# Patient Record
Sex: Female | Born: 1990 | Race: Black or African American | Hispanic: No | Marital: Single | Smoking: Never smoker
Health system: Southern US, Community
[De-identification: ages and names within clinical notes are randomized; demographics above are authoritative.]

## PROBLEM LIST (undated history)

## (undated) DIAGNOSIS — F32A Depression, unspecified: Secondary | ICD-10-CM

## (undated) DIAGNOSIS — A599 Trichomoniasis, unspecified: Secondary | ICD-10-CM

## (undated) DIAGNOSIS — F329 Major depressive disorder, single episode, unspecified: Secondary | ICD-10-CM

## (undated) DIAGNOSIS — F952 Tourette's disorder: Secondary | ICD-10-CM

## (undated) DIAGNOSIS — Z789 Other specified health status: Secondary | ICD-10-CM

## (undated) DIAGNOSIS — R87629 Unspecified abnormal cytological findings in specimens from vagina: Secondary | ICD-10-CM

## (undated) DIAGNOSIS — R87619 Unspecified abnormal cytological findings in specimens from cervix uteri: Secondary | ICD-10-CM

## (undated) HISTORY — DX: Depression, unspecified: F32.A

## (undated) HISTORY — PX: NO PAST SURGERIES: SHX2092

## (undated) HISTORY — DX: Trichomoniasis, unspecified: A59.9

## (undated) HISTORY — PX: COLPOSCOPY: SHX161

---
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
## 2012-07-14 ENCOUNTER — Encounter (HOSPITAL_BASED_OUTPATIENT_CLINIC_OR_DEPARTMENT_OTHER): Payer: Self-pay | Admitting: Emergency Medicine

## 2012-07-14 ENCOUNTER — Emergency Department (HOSPITAL_BASED_OUTPATIENT_CLINIC_OR_DEPARTMENT_OTHER)
Admission: EM | Admit: 2012-07-14 | Discharge: 2012-07-14 | Disposition: A | Discharge status: Home / Self Care | Payer: Medicaid Other | Attending: Emergency Medicine | Admitting: Emergency Medicine

## 2012-07-14 DIAGNOSIS — Z3201 Encounter for pregnancy test, result positive: Secondary | ICD-10-CM | POA: Insufficient documentation

## 2012-07-14 DIAGNOSIS — Z3490 Encounter for supervision of normal pregnancy, unspecified, unspecified trimester: Secondary | ICD-10-CM

## 2012-07-14 LAB — URINALYSIS, ROUTINE W REFLEX MICROSCOPIC
Hgb urine dipstick: NEGATIVE
Ketones, ur: NEGATIVE mg/dL
Protein, ur: NEGATIVE mg/dL
Urobilinogen, UA: 0.2 mg/dL (ref 0.0–1.0)

## 2012-07-14 LAB — PREGNANCY, URINE: Preg Test, Ur: POSITIVE — AB

## 2012-07-14 MED ORDER — PRENATAL COMPLETE 14-0.4 MG PO TABS: ORAL_TABLET | ORAL | Status: DC

## 2012-07-14 NOTE — ED Provider Notes (Signed)
Medical screening examination/treatment/procedure(s) were performed by non-physician practitioner and as supervising physician I was immediately available for consultation/collaboration.  Geoffery Lyons, MD 07/14/12 2256

## 2012-07-14 NOTE — ED Notes (Signed)
Pt reports "I took four pregnancy tests and they were positive and I wanted to come and confirm".

## 2012-07-14 NOTE — ED Provider Notes (Signed)
History     CSN: [id number]  Arrival date & time 07/14/12  1748   First MD Initiated Contact with Patient 07/14/12 1812      Chief Complaint  Patient presents with  . testing     (Consider location/radiation/quality/duration/timing/severity/associated sxs/prior treatment) HPI Comments: Pt has had 4 positive pregnancy test.  Pt reports she came in to get confirmation.  Pt reports this is her first pregnancy. Pt's last period was Nov 2.  Pt denies any complaints  The history is provided by the patient. No language interpreter was used.    History reviewed. No pertinent past medical history.  History reviewed. No pertinent past surgical history.  No family history on file.  History  Substance Use Topics  . Smoking status: Never Smoker   . Smokeless tobacco: Not on file  . Alcohol Use: Yes    OB History    Grav Para Term Preterm Abortions TAB SAB Ect Mult Living                  Review of Systems  All other systems reviewed and are negative.    Allergies  Review of patient's allergies indicates no known allergies.  Home Medications  No current outpatient prescriptions on file.  BP 118/74  Pulse 89  Temp 98 F (36.7 C) (Oral)  Resp 20  SpO2 100%  LMP 06/13/2012  Physical Exam  Nursing note and vitals reviewed. Constitutional: She appears well-developed and well-nourished.  HENT:  Head: Normocephalic and atraumatic.  Cardiovascular: Normal rate and normal heart sounds.   Pulmonary/Chest: Effort normal and breath sounds normal.  Abdominal: Soft. Bowel sounds are normal.  Musculoskeletal: Normal range of motion.  Neurological: She is alert.  Psychiatric: She has a normal mood and affect.    ED Course  Procedures (including critical care time)  Labs Reviewed  PREGNANCY, URINE - Abnormal; Notable for the following:    Preg Test, Ur POSITIVE (*)     All other components within normal limits  URINALYSIS, ROUTINE W REFLEX MICROSCOPIC   No results  found.   No diagnosis found.    MDM  Positive pregnancy test.   Pt given rx for prenatal vitamins        Lonia Skinner [place], [place] 07/14/12 1837

## 2013-01-16 DIAGNOSIS — B009 Herpesviral infection, unspecified: Secondary | ICD-10-CM

## 2013-01-16 HISTORY — DX: Herpesviral infection, unspecified: B00.9

## 2014-09-27 ENCOUNTER — Encounter (HOSPITAL_COMMUNITY): Payer: Self-pay | Admitting: *Deleted

## 2014-09-27 ENCOUNTER — Emergency Department (INDEPENDENT_AMBULATORY_CARE_PROVIDER_SITE_OTHER)
Admission: EM | Admit: 2014-09-27 | Discharge: 2014-09-27 | Disposition: A | Discharge status: Home / Self Care | Payer: 59 | Source: Home / Self Care | Attending: Family Medicine | Admitting: Family Medicine

## 2014-09-27 DIAGNOSIS — K529 Noninfective gastroenteritis and colitis, unspecified: Secondary | ICD-10-CM | POA: Diagnosis not present

## 2014-09-27 LAB — POCT URINALYSIS DIP (DEVICE)
Glucose, UA: NEGATIVE mg/dL
Ketones, ur: 40 mg/dL — AB
Leukocytes, UA: NEGATIVE
NITRITE: NEGATIVE
PH: 7 (ref 5.0–8.0)
PROTEIN: 30 mg/dL — AB
Specific Gravity, Urine: 1.025 (ref 1.005–1.030)
UROBILINOGEN UA: 1 mg/dL (ref 0.0–1.0)

## 2014-09-27 LAB — POCT I-STAT, CHEM 8
BUN: 8 mg/dL (ref 6–23)
CREATININE: 0.6 mg/dL (ref 0.50–1.10)
Calcium, Ion: 1.2 mmol/L (ref 1.12–1.23)
Chloride: 101 mmol/L (ref 96–112)
Glucose, Bld: 124 mg/dL — ABNORMAL HIGH (ref 70–99)
HEMATOCRIT: 41 % (ref 36.0–46.0)
HEMOGLOBIN: 13.9 g/dL (ref 12.0–15.0)
Potassium: 3.8 mmol/L (ref 3.5–5.1)
SODIUM: 140 mmol/L (ref 135–145)
TCO2: 23 mmol/L (ref 0–100)

## 2014-09-27 LAB — POCT PREGNANCY, URINE: PREG TEST UR: NEGATIVE

## 2014-09-27 MED ORDER — ONDANSETRON HCL 4 MG/2ML IJ SOLN
INTRAMUSCULAR | Status: AC
  Filled 2014-09-27: qty 2

## 2014-09-27 MED ORDER — ONDANSETRON HCL 4 MG PO TABS: 4.0000 mg | ORAL_TABLET | Freq: Four times a day (QID) | ORAL | Status: DC

## 2014-09-27 MED ORDER — ONDANSETRON 4 MG PO TBDP
ORAL_TABLET | ORAL | Status: AC
  Filled 2014-09-27: qty 2

## 2014-09-27 MED ORDER — ONDANSETRON 4 MG PO TBDP
8.0000 mg | ORAL_TABLET | Freq: Once | ORAL | Status: AC
  Administered 2014-09-27: 8 mg via ORAL

## 2014-09-27 MED ORDER — ONDANSETRON HCL 4 MG/2ML IJ SOLN
4.0000 mg | Freq: Once | INTRAMUSCULAR | Status: AC
  Administered 2014-09-27: 4 mg via INTRAVENOUS

## 2014-09-27 MED ORDER — SODIUM CHLORIDE 0.9 % IV BOLUS (SEPSIS)
1000.0000 mL | Freq: Once | INTRAVENOUS | Status: AC
  Administered 2014-09-27: 1000 mL via INTRAVENOUS

## 2014-09-27 NOTE — ED Notes (Signed)
C/o IV leaking- minimal amount.  Tightened at hub of angiocath.  No further leaking noted.

## 2014-09-27 NOTE — ED Notes (Signed)
Pt. drank 1 cup gatorade without vomiting.  No further nausea.  Feels better. Dr. Juventino Slovak notified.

## 2014-09-27 NOTE — ED Notes (Signed)
Vomited 100 yellow bile with Zofran pills noted. 1 pill partially dissolved the other is whole.  Dr. Juventino Slovak notified.

## 2014-09-27 NOTE — Discharge Instructions (Signed)
Clear liquid , bland diet tonight as tolerated, advance on wed as improved, use medicine as needed, return or see your doctor if any problems.

## 2014-09-27 NOTE — ED Provider Notes (Signed)
CSN: [id number]     Arrival date & time 09/27/14  1542 History   First MD Initiated Contact with Patient 09/27/14 1737     Chief Complaint  Patient presents with  . Abdominal Pain   (Consider location/radiation/quality/duration/timing/severity/associated sxs/prior Treatment) Patient is a 24 y.o. female presenting with abdominal pain and vomiting.  Abdominal Pain Associated symptoms: nausea and vomiting   Associated symptoms: no constipation and no diarrhea   Emesis Severity:  Moderate Duration:  8 hours Quality:  Stomach contents Progression:  Unchanged Chronicity:  New Relieved by:  None tried Worsened by:  Nothing tried Ineffective treatments:  None tried Associated symptoms: abdominal pain   Associated symptoms: no cough, no diarrhea and no fever   Risk factors: no sick contacts and no suspect food intake     History reviewed. No pertinent past medical history. History reviewed. No pertinent past surgical history. History reviewed. No pertinent family history. History  Substance Use Topics  . Smoking status: Never Smoker   . Smokeless tobacco: Not on file  . Alcohol Use: Yes     Comment: occasional   OB History    No data available     Review of Systems  Constitutional: Negative.   HENT: Negative.   Gastrointestinal: Positive for nausea, vomiting and abdominal pain. Negative for diarrhea, constipation and blood in stool.    Allergies  Review of patient's allergies indicates no known allergies.  Home Medications   Prior to Admission medications   Medication Sig Start Date End Date Taking? Authorizing Provider  ondansetron (ZOFRAN) 4 MG tablet Take 1 tablet (4 mg total) by mouth every 6 (six) hours. Prn n/v 09/27/14   Billy Fischer, MD  Prenatal Vit-Fe Fumarate-FA (PRENATAL COMPLETE) 14-0.4 MG TABS One a day 07/14/12   Fransico Meadow, PA-C   BP 117/72 mmHg  Pulse 100  Temp(Src) 98.6 F (37 C) (Oral)  Resp 16  SpO2 98%  LMP  (LMP Unknown) Physical Exam    Constitutional: She is oriented to person, place, and time. She appears well-developed and well-nourished. No distress.  HENT:  Mouth/Throat: Oropharynx is clear and moist.  Eyes: Conjunctivae are normal. Pupils are equal, round, and reactive to light.  Neck: Normal range of motion. Neck supple.  Cardiovascular: Normal heart sounds and intact distal pulses.   Pulmonary/Chest: Effort normal and breath sounds normal.  Abdominal: Soft. Normal appearance and bowel sounds are normal. She exhibits no distension and no mass. There is no hepatosplenomegaly. There is tenderness in the epigastric area. There is no rigidity, no rebound, no guarding, no CVA tenderness, no tenderness at McBurney's point and negative Murphy's sign.    Genitourinary:  No pelvic tenderness or d/c.  Lymphadenopathy:    She has no cervical adenopathy.  Neurological: She is alert and oriented to person, place, and time.  Skin: Skin is warm and dry.  Nursing note and vitals reviewed.   ED Course  Procedures (including critical care time) Labs Review Labs Reviewed  POCT URINALYSIS DIP (DEVICE) - Abnormal; Notable for the following:    Bilirubin Urine SMALL (*)    Ketones, ur 40 (*)    Hgb urine dipstick MODERATE (*)    Protein, ur 30 (*)    All other components within normal limits  POCT I-STAT, CHEM 8 - Abnormal; Notable for the following:    Glucose, Bld 124 (*)    All other components within normal limits  POCT PREGNANCY, URINE    Imaging Review No results found.  MDM   1. Gastroenteritis, acute    Vomited po zofran  So ivf and meds given. Sx improved after ivf and meds, tolerating po.    Billy Fischer, MD 09/27/14 [phone]

## 2014-09-27 NOTE — ED Notes (Addendum)
C/o lower mid abdominal pain onset this AM.  Pain is constant. Had to leave work @ 0800.  C/o vomiting and feeling lightheaded.  No diarrhea. Small BM today.  V x 20 today.  No fever.  LMP unknown. Has implant contraception.  No fever.

## 2017-08-26 ENCOUNTER — Other Ambulatory Visit: Payer: Self-pay

## 2017-08-26 ENCOUNTER — Encounter (HOSPITAL_BASED_OUTPATIENT_CLINIC_OR_DEPARTMENT_OTHER): Payer: Self-pay | Admitting: Respiratory Therapy

## 2017-08-26 ENCOUNTER — Emergency Department (HOSPITAL_BASED_OUTPATIENT_CLINIC_OR_DEPARTMENT_OTHER)
Admission: EM | Admit: 2017-08-26 | Discharge: 2017-08-26 | Disposition: A | Discharge status: Home / Self Care | Payer: 59 | Attending: Emergency Medicine | Admitting: Emergency Medicine

## 2017-08-26 ENCOUNTER — Emergency Department (HOSPITAL_BASED_OUTPATIENT_CLINIC_OR_DEPARTMENT_OTHER): Payer: 59

## 2017-08-26 DIAGNOSIS — R05 Cough: Secondary | ICD-10-CM | POA: Diagnosis present

## 2017-08-26 DIAGNOSIS — J189 Pneumonia, unspecified organism: Secondary | ICD-10-CM | POA: Insufficient documentation

## 2017-08-26 DIAGNOSIS — Z79899 Other long term (current) drug therapy: Secondary | ICD-10-CM | POA: Diagnosis not present

## 2017-08-26 DIAGNOSIS — J181 Lobar pneumonia, unspecified organism: Secondary | ICD-10-CM

## 2017-08-26 MED ORDER — AZITHROMYCIN 250 MG PO TABS: 250.0000 mg | ORAL_TABLET | Freq: Every day | ORAL | 0 refills | Status: DC

## 2017-08-26 MED FILL — AZITHROMYCIN 250 MG TABLET: 250 | 5 days supply | Qty: 6 | Fill #0

## 2017-08-26 NOTE — ED Triage Notes (Signed)
Cough body aches, headache and fever x 2 days.

## 2017-08-26 NOTE — ED Provider Notes (Signed)
[place] EMERGENCY DEPARTMENT Provider Note   CSN: [id number] Arrival date & time: 08/26/17  1049     History   Chief Complaint Chief Complaint  Patient presents with  . Cough    HPI Jody Robinson is a 27 y.o. female.  27yo F who p/w cough.  She reports 2 days of cough associated with body aches, headache, congestion, and fevers up to 102 yesterday.  No sore throat, vomiting, or diarrhea.  She has been around a niece who is sick with similar symptoms.  She has taken Motrin without much relief, last dose was yesterday.  No medications today prior to arrival.  No urinary symptoms.   The history is provided by the patient.    History reviewed. No pertinent past medical history.  There are no active problems to display for this patient.   History reviewed. No pertinent surgical history.  OB History    No data available       Home Medications    Prior to Admission medications   Medication Sig Start Date End Date Taking? Authorizing Provider  azithromycin (ZITHROMAX) 250 MG tablet Take 1 tablet (250 mg total) by mouth daily. Take first 2 tablets together, then 1 every day until finished. 08/26/17   Filipe Greathouse, Wenda Overland, MD  ondansetron (ZOFRAN) 4 MG tablet Take 1 tablet (4 mg total) by mouth every 6 (six) hours. Prn n/v 09/27/14   Billy Fischer, MD  Prenatal Vit-Fe Fumarate-FA (PRENATAL COMPLETE) 14-0.4 MG TABS One a day 07/14/12   Fransico Meadow, PA-C    Family History No family history on file.  Social History Social History   Tobacco Use  . Smoking status: Never Smoker  . Smokeless tobacco: Never Used  Substance Use Topics  . Alcohol use: Yes    Comment: occasional  . Drug use: No     Allergies   Patient has no known allergies.   Review of Systems Review of Systems All other systems reviewed and are negative except that which was mentioned in HPI   Physical Exam Updated Vital Signs BP 132/82   Pulse (!) 112   Temp 99.3 F (37.4  C) (Oral)   Resp 14   Ht 5' (1.524 m)   Wt 68 kg (150 lb)   SpO2 98%   BMI 29.29 kg/m   Physical Exam  Constitutional: She is oriented to person, place, and time. She appears well-developed and well-nourished. No distress.  HENT:  Head: Normocephalic and atraumatic.  Mouth/Throat: Oropharynx is clear and moist. No oropharyngeal exudate.  Moist mucous membranes  Eyes: Conjunctivae are normal. Pupils are equal, round, and reactive to light.  Neck: Normal range of motion. Neck supple.  Cardiovascular: Normal rate, regular rhythm and normal heart sounds.  No murmur heard. Pulmonary/Chest: Effort normal and breath sounds normal.  Abdominal: Soft. Bowel sounds are normal. She exhibits no distension. There is no tenderness.  Musculoskeletal: She exhibits no edema.  Lymphadenopathy:    She has no cervical adenopathy.  Neurological: She is alert and oriented to person, place, and time.  Fluent speech  Skin: Skin is warm and dry. No rash noted.  Psychiatric: She has a normal mood and affect. Judgment normal.  Nursing note and vitals reviewed.    ED Treatments / Results  Labs (all labs ordered are listed, but only abnormal results are displayed) Labs Reviewed - No data to display  EKG  EKG Interpretation None       Radiology Dg Chest 2  View  Result Date: 08/26/2017 CLINICAL DATA:  Cough, fever, and congestion for 2 days, smoker EXAM: CHEST  2 VIEW COMPARISON:  None FINDINGS: Normal heart size, mediastinal contours, and pulmonary vascularity. Peribronchial thickening with question subtle RIGHT suprahilar infiltrate. Remaining lungs clear. No pleural effusion or pneumothorax. Bones unremarkable. IMPRESSION: Bronchitic changes with question perihilar RIGHT upper lobe infiltrate. Electronically Signed   By: Lavonia Dana M.D.   On: 08/26/2017 11:46    Procedures Procedures (including critical care time)  Medications Ordered in ED Medications - No data to display   Initial  Impression / Assessment and Plan / ED Course  I have reviewed the triage vital signs and the nursing notes.  Pertinent imaging results that were available during my care of the patient were reviewed by me and considered in my medical decision making (see chart for details).     Well appearing on exam w/ reassuring VS, O2 sat 98-100% on RA, normal WOB. CXR shows ? R upper lobe infiltrate. Given fevers and other sx, will treat for CAP with azithromycin as pt has no other major comorbidities. She is otherwise well appearing and comfortable w/ outpatient management.  Discussed supportive measures for her symptoms and extensively reviewed return precautions.  She voiced understanding and was discharged in satisfactory condition. Final Clinical Impressions(s) / ED Diagnoses   Final diagnoses:  Community acquired pneumonia of right upper lobe of lung [facility])    ED Discharge Orders        Ordered    azithromycin (ZITHROMAX) 250 MG tablet  Daily     08/26/17 1240       Artavis Cowie, Wenda Overland, MD 08/26/17 1251

## 2017-10-09 DIAGNOSIS — J302 Other seasonal allergic rhinitis: Secondary | ICD-10-CM | POA: Insufficient documentation

## 2017-10-09 DIAGNOSIS — Z8742 Personal history of other diseases of the female genital tract: Secondary | ICD-10-CM

## 2017-10-09 HISTORY — DX: Other seasonal allergic rhinitis: J30.2

## 2017-10-09 HISTORY — DX: Personal history of other diseases of the female genital tract: Z87.42

## 2017-10-13 DIAGNOSIS — R748 Abnormal levels of other serum enzymes: Secondary | ICD-10-CM

## 2017-10-13 HISTORY — DX: Abnormal levels of other serum enzymes: R74.8

## 2018-04-02 ENCOUNTER — Encounter (HOSPITAL_BASED_OUTPATIENT_CLINIC_OR_DEPARTMENT_OTHER): Payer: Self-pay | Admitting: *Deleted

## 2018-04-02 ENCOUNTER — Other Ambulatory Visit: Payer: Self-pay

## 2018-04-02 ENCOUNTER — Emergency Department (HOSPITAL_BASED_OUTPATIENT_CLINIC_OR_DEPARTMENT_OTHER)
Admission: EM | Admit: 2018-04-02 | Discharge: 2018-04-02 | Disposition: A | Discharge status: Home / Self Care | Payer: 59 | Attending: Emergency Medicine | Admitting: Emergency Medicine

## 2018-04-02 DIAGNOSIS — J020 Streptococcal pharyngitis: Secondary | ICD-10-CM | POA: Insufficient documentation

## 2018-04-02 DIAGNOSIS — Z79899 Other long term (current) drug therapy: Secondary | ICD-10-CM | POA: Diagnosis not present

## 2018-04-02 DIAGNOSIS — J029 Acute pharyngitis, unspecified: Secondary | ICD-10-CM | POA: Diagnosis present

## 2018-04-02 LAB — GROUP A STREP BY PCR: GROUP A STREP BY PCR: DETECTED — AB

## 2018-04-02 LAB — PREGNANCY, URINE: Preg Test, Ur: NEGATIVE

## 2018-04-02 MED ORDER — DEXAMETHASONE SODIUM PHOSPHATE 10 MG/ML IJ SOLN
10.0000 mg | Freq: Once | INTRAMUSCULAR | Status: AC
  Administered 2018-04-02: 10 mg via INTRAMUSCULAR
  Filled 2018-04-02: qty 1

## 2018-04-02 MED ORDER — PENICILLIN G BENZATHINE & PROC 1200000 UNIT/2ML IM SUSP
1.2000 10*6.[IU] | Freq: Once | INTRAMUSCULAR | Status: AC
  Administered 2018-04-02: 1.2 10*6.[IU] via INTRAMUSCULAR
  Filled 2018-04-02: qty 2

## 2018-04-02 MED ORDER — ACETAMINOPHEN 500 MG PO TABS
1000.0000 mg | ORAL_TABLET | Freq: Once | ORAL | Status: AC
  Administered 2018-04-02: 1000 mg via ORAL
  Filled 2018-04-02: qty 2

## 2018-04-02 NOTE — Discharge Instructions (Signed)
You can take Tylenol or Ibuprofen as directed for pain. You can alternate Tylenol and Ibuprofen every 4 hours. If you take Tylenol at 1pm, then you can take Ibuprofen at 5pm. Then you can take Tylenol again at 9pm.   Make sure you are staying hydrated drinking plenty of fluids.  Return to emergency department for any fever, worsening pain, difficulty swallowing her secretions, vomiting or any other worsening or concerning symptoms.

## 2018-04-02 NOTE — ED Triage Notes (Signed)
Pt reports sore throat x yesterday, denies fever, occassional non productive cough.

## 2018-04-02 NOTE — ED Provider Notes (Signed)
.  Encouraged at home supportive care measures. [place] EMERGENCY DEPARTMENT Provider Note   CSN: [id number] Arrival date & time: 04/02/18  1523     History   Chief Complaint Chief Complaint  Patient presents with  . Sore Throat    HPI Jody Robinson is a 27 y.o. female who presents for evaluation of sore throat since yesterday.  Patient reports that she has not taken any medication for the pain.  She reports she has not had any fever.  Reports some cough but states the cough is nonproductive.  Patient states that pain is worsened with swallowing but she has been able to tolerate her secretions and tolerate p.o.  Patient denies any recent nasal congestion, runny nose, SOB, vomiting.   The history is provided by the patient.    History reviewed. No pertinent past medical history.  There are no active problems to display for this patient.   History reviewed. No pertinent surgical history.   OB History   None      Home Medications    Prior to Admission medications   Medication Sig Start Date End Date Taking? Authorizing Provider  azithromycin (ZITHROMAX) 250 MG tablet Take 1 tablet (250 mg total) by mouth daily. Take first 2 tablets together, then 1 every day until finished. 08/26/17   Little, Wenda Overland, MD  ondansetron (ZOFRAN) 4 MG tablet Take 1 tablet (4 mg total) by mouth every 6 (six) hours. Prn n/v 09/27/14   Billy Fischer, MD  Prenatal Vit-Fe Fumarate-FA (PRENATAL COMPLETE) 14-0.4 MG TABS One a day 07/14/12   Sidney Ace    Family History History reviewed. No pertinent family history.  Social History Social History   Tobacco Use  . Smoking status: Never Smoker  . Smokeless tobacco: Never Used  Substance Use Topics  . Alcohol use: Yes    Comment: occasional  . Drug use: No     Allergies   Patient has no known allergies.   Review of Systems Review of Systems  Constitutional: Negative for fever.  HENT: Positive for sore  throat. Negative for congestion, drooling, rhinorrhea and trouble swallowing.   Respiratory: Positive for cough. Negative for shortness of breath.   Gastrointestinal: Negative for vomiting.  All other systems reviewed and are negative.    Physical Exam Updated Vital Signs BP 125/71 (BP Location: Left Arm)   Pulse 98   Temp 99.7 F (37.6 C) (Oral)   Resp 16   LMP 03/12/2018   SpO2 100%   Physical Exam  Constitutional: She appears well-developed and well-nourished.  HENT:  Head: Normocephalic and atraumatic.  Posterior pharynx is erythematous.  Tonsillar exudates noted on the right side.  Uvula is midline.  No trismus.  Airways patent, phonation is intact.  Eyes: Conjunctivae and EOM are normal. Right eye exhibits no discharge. Left eye exhibits no discharge. No scleral icterus.  Pulmonary/Chest: Effort normal.  Neurological: She is alert.  Skin: Skin is warm and dry.  Psychiatric: She has a normal mood and affect. Her speech is normal and behavior is normal.  Nursing note and vitals reviewed.    ED Treatments / Results  Labs (all labs ordered are listed, but only abnormal results are displayed) Labs Reviewed  GROUP A STREP BY PCR - Abnormal; Notable for the following components:      Result Value   Group A Strep by PCR DETECTED (*)    All other components within normal limits  PREGNANCY, URINE  EKG None  Radiology No results found.  Procedures Procedures (including critical care time)  Medications Ordered in ED Medications  acetaminophen (TYLENOL) tablet 1,000 mg (1,000 mg Oral Given 04/02/18 1650)  penicillin g procaine-penicillin g benzathine (BICILLIN-CR) injection 600000-600000 units (1.2 Million Units Intramuscular Given 04/02/18 1705)  dexamethasone (DECADRON) injection 10 mg (10 mg Intramuscular Given 04/02/18 1705)     Initial Impression / Assessment and Plan / ED Course  I have reviewed the triage vital signs and the nursing notes.  Pertinent labs  & imaging results that were available during my care of the patient were reviewed by me and considered in my medical decision making (see chart for details).     27 year old female who presents for evaluation of throat that began yesterday.  No fevers.  Reports nonproductive cough.  Able to tolerate secretions and p.o. Patient is afebrile, non-toxic appearing, sitting comfortably on examination table. Vital signs reviewed and stable.  Exam, posterior oropharynx is erythematous with evidence of sided tonsillar exudates.  Consider pharyngitis.  History/physical exam is not concerning for peritonsillar abscess, Ludwig angina.  Rapid strep ordered at triage.  Strep Reviewed.  Positive.  Discussed results with patient.  She would rather be treated with the IM penicillin.  Encouraged at home supportive care measures. Patient had ample opportunity for questions and discussion. All patient's questions were answered with full understanding. Strict return precautions discussed. Patient expresses understanding and agreement to plan.   Final Clinical Impressions(s) / ED Diagnoses   Final diagnoses:  Strep pharyngitis    ED Discharge Orders    None       Volanda Napoleon, PA-C 04/02/18 [place], DO 04/02/18 2245

## 2018-12-06 ENCOUNTER — Encounter (HOSPITAL_COMMUNITY): Payer: Self-pay | Admitting: *Deleted

## 2018-12-06 ENCOUNTER — Emergency Department (HOSPITAL_COMMUNITY)
Admission: EM | Admit: 2018-12-06 | Discharge: 2018-12-06 | Disposition: A | Discharge status: Home / Self Care | Payer: 59 | Attending: Emergency Medicine | Admitting: Emergency Medicine

## 2018-12-06 ENCOUNTER — Other Ambulatory Visit: Payer: Self-pay

## 2018-12-06 DIAGNOSIS — Z3A01 Less than 8 weeks gestation of pregnancy: Secondary | ICD-10-CM | POA: Diagnosis not present

## 2018-12-06 DIAGNOSIS — O219 Vomiting of pregnancy, unspecified: Secondary | ICD-10-CM | POA: Diagnosis present

## 2018-12-06 DIAGNOSIS — Z79899 Other long term (current) drug therapy: Secondary | ICD-10-CM | POA: Insufficient documentation

## 2018-12-06 LAB — I-STAT BETA HCG BLOOD, ED (MC, WL, AP ONLY): I-stat hCG, quantitative: >2000 m[IU]/mL — ABNORMAL HIGH (ref <5)

## 2018-12-06 MED ORDER — ONDANSETRON 4 MG PO TBDP
4.0000 mg | ORAL_TABLET | Freq: Once | ORAL | Status: AC
  Administered 2018-12-06: 02:00:00 4 mg via ORAL
  Filled 2018-12-06: qty 1

## 2018-12-06 MED ORDER — VITAMIN B-6 100 MG PO TABS
100.0000 mg | ORAL_TABLET | ORAL | Status: AC
  Administered 2018-12-06: 100 mg via ORAL
  Filled 2018-12-06: qty 1

## 2018-12-06 MED ORDER — SODIUM CHLORIDE 0.9 % IV BOLUS
1000.0000 mL | Freq: Once | INTRAVENOUS | Status: AC
  Administered 2018-12-06: 1000 mL via INTRAVENOUS

## 2018-12-06 MED ORDER — DOXYLAMINE-PYRIDOXINE 10-10 MG PO TBEC: 2.0000 | DELAYED_RELEASE_TABLET | Freq: Every day | ORAL | 0 refills | Status: AC

## 2018-12-06 MED ORDER — DOXYLAMINE SUCCINATE (SLEEP) 25 MG PO TABS
25.0000 mg | ORAL_TABLET | Freq: Every evening | ORAL | Status: DC | PRN
  Administered 2018-12-06: 25 mg via ORAL
  Filled 2018-12-06 (×2): qty 1

## 2018-12-06 NOTE — ED Notes (Signed)
Pt given and verbalized understanding of d/c instructions and need for follow up with pcp and OB/Gyn. Told to return if s/s worsen. No further distress or questions upon ambulation out of department.

## 2018-12-06 NOTE — Discharge Instructions (Signed)
Use Diclegis as prescribed for management of nausea.  Drink plenty of clear liquids to prevent dehydration.  Follow-up with your OB/GYN if symptoms persist.

## 2018-12-06 NOTE — ED Notes (Signed)
Pt resting in bed. Feels some relief from medications but still has nausea. Was able to keep fluids and meds down. Pt in NAD. VS rechecked.

## 2018-12-06 NOTE — ED Triage Notes (Signed)
Pt c/o nausea x 3 days.  Pt stated "I did the test myself and I'm [redacted] weeks pregnant.  My appt is 5-8."

## 2018-12-06 NOTE — ED Provider Notes (Signed)
[facility] DEPT Provider Note   CSN: [id number] Arrival date & time: 12/06/18  0009    History   Chief Complaint Chief Complaint  Patient presents with  . Nausea  . Routine Prenatal Visit  . Morning Sickness    HPI Jody Robinson is a 28 y.o. female.     28 year old G2 female, currently reporting to be [redacted] weeks pregnant (LMP 10/20/18), presents to the emergency department for complaints of nausea x3 days.  She states that she has been unable to eat or drink anything due to nausea.  She has had 3-4 episodes of vomiting today.  Emesis has been nonbloody.  Subjectively feels dehydrated.  No significant abdominal pain.  No vaginal bleeding, fevers, lightheadedness, syncope.  Patient denies taking any medications prior to arrival.  She has experienced nausea in pregnancy previously.  Scheduled for her first prenatal visit on 12/18/2018.  The history is provided by the patient. No language interpreter was used.    History reviewed. No pertinent past medical history.  There are no active problems to display for this patient.   History reviewed. No pertinent surgical history.   OB History    Gravida  1   Para      Term      Preterm      AB      Living        SAB      TAB      Ectopic      Multiple      Live Births               Home Medications    Prior to Admission medications   Medication Sig Start Date End Date Taking? Authorizing Provider  azithromycin (ZITHROMAX) 250 MG tablet Take 1 tablet (250 mg total) by mouth daily. Take first 2 tablets together, then 1 every day until finished. 08/26/17   Little, Wenda Overland, MD  Doxylamine-Pyridoxine (DICLEGIS) 10-10 MG TBEC Take 2 tablets by mouth at bedtime for 10 days. 12/06/18 12/16/18  Antonietta Breach, PA-C  ondansetron (ZOFRAN) 4 MG tablet Take 1 tablet (4 mg total) by mouth every 6 (six) hours. Prn n/v 09/27/14   Billy Fischer, MD  Prenatal Vit-Fe Fumarate-FA (PRENATAL  COMPLETE) 14-0.4 MG TABS One a day 07/14/12   Fransico Meadow, PA-C    Family History No family history on file.  Social History Social History   Tobacco Use  . Smoking status: Never Smoker  . Smokeless tobacco: Never Used  Substance Use Topics  . Alcohol use: Yes    Comment: occasional  . Drug use: No     Allergies   Patient has no known allergies.   Review of Systems Review of Systems Ten systems reviewed and are negative for acute change, except as noted in the HPI.    Physical Exam Updated Vital Signs BP 113/82 (BP Location: Left Arm)   Pulse 76   Temp 98.8 F (37.1 C) (Oral)   Resp 17   Ht 5' (1.524 m)   Wt 68 kg   LMP 10/20/2018 (Exact Date)   SpO2 100%   BMI 29.29 kg/m   Physical Exam Vitals signs and nursing note reviewed.  Constitutional:      General: She is not in acute distress.    Appearance: She is well-developed. She is not diaphoretic.     Comments: Nontoxic appearing and in NAD  HENT:     Head: Normocephalic and atraumatic.  Mouth/Throat:     Comments: Mildly dry mm Eyes:     General: No scleral icterus.    Conjunctiva/sclera: Conjunctivae normal.  Neck:     Musculoskeletal: Normal range of motion.  Pulmonary:     Effort: Pulmonary effort is normal. No respiratory distress.     Comments: Respirations even and unlabored Abdominal:     Comments: Soft, nontender, nondistended abdomen.  Musculoskeletal: Normal range of motion.  Skin:    General: Skin is warm and dry.     Coloration: Skin is not pale.     Findings: No erythema or rash.  Neurological:     Mental Status: She is alert and oriented to person, place, and time.  Psychiatric:        Behavior: Behavior normal.      ED Treatments / Results  Labs (all labs ordered are listed, but only abnormal results are displayed) Labs Reviewed  I-STAT BETA HCG BLOOD, ED ([facility], [facility], [facility] ONLY) - Abnormal; Notable for the following components:      Result Value   I-stat hCG,  quantitative >2,000.0 (*)    All other components within normal limits    EKG None  Radiology No results found.  Procedures Procedures (including critical care time)  Medications Ordered in ED Medications  doxylamine (Sleep) (UNISOM) tablet 25 mg (25 mg Oral Given 12/06/18 0111)  pyridOXINE (VITAMIN B-6) tablet 100 mg (100 mg Oral Given 12/06/18 0112)  sodium chloride 0.9 % bolus 1,000 mL (0 mLs Intravenous Stopped 12/06/18 0203)  ondansetron (ZOFRAN-ODT) disintegrating tablet 4 mg (4 mg Oral Given 12/06/18 0202)    2:00 AM Zofran ordered for persistent nausea though Unisom/B6 did "take the edge off", per patient. She has tolerated water without vomiting.  2:30 AM Nausea now resolved. VSS. Appropriate for discharge.   Initial Impression / Assessment and Plan / ED Course  I have reviewed the triage vital signs and the nursing notes.  Pertinent labs & imaging results that were available during my care of the patient were reviewed by me and considered in my medical decision making (see chart for details).        28 year old female presents to the emergency department for evaluation of worsening nausea and first trimester of pregnancy.  Initially managed with doxylamine/pyridoxine with some improvement.  Subsequently given 1 tablet of ODT Zofran for persistent nausea.  She has been able to tolerate oral fluids without difficulty.  Abdomen soft, nontender.  No complaints of vaginal bleeding.  Vital signs have been stable.  The patient is appropriate for discharge and outpatient follow-up with her OB/GYN it scheduled prenatal visit in 12 days.  Return precautions discussed and provided. Patient discharged in stable condition with no unaddressed concerns.   Final Clinical Impressions(s) / ED Diagnoses   Final diagnoses:  Nausea and vomiting during pregnancy prior to [redacted] weeks gestation    ED Discharge Orders         Ordered    Doxylamine-Pyridoxine (DICLEGIS) 10-10 MG TBEC  Daily  at bedtime     12/06/18 0226           Antonietta Breach, PA-C 12/06/18 6734    Orpah Greek, MD 12/06/18 2325

## 2018-12-14 ENCOUNTER — Inpatient Hospital Stay (HOSPITAL_COMMUNITY)
Admission: AD | Admit: 2018-12-14 | Discharge: 2018-12-14 | Disposition: A | Discharge status: Home / Self Care | Payer: 59 | Attending: Obstetrics & Gynecology | Admitting: Obstetrics & Gynecology

## 2018-12-14 ENCOUNTER — Encounter (HOSPITAL_COMMUNITY): Payer: Self-pay | Admitting: *Deleted

## 2018-12-14 ENCOUNTER — Other Ambulatory Visit: Payer: Self-pay

## 2018-12-14 DIAGNOSIS — Z3A01 Less than 8 weeks gestation of pregnancy: Secondary | ICD-10-CM | POA: Diagnosis not present

## 2018-12-14 DIAGNOSIS — Z79899 Other long term (current) drug therapy: Secondary | ICD-10-CM | POA: Diagnosis not present

## 2018-12-14 DIAGNOSIS — R1030 Lower abdominal pain, unspecified: Secondary | ICD-10-CM | POA: Diagnosis not present

## 2018-12-14 DIAGNOSIS — O9989 Other specified diseases and conditions complicating pregnancy, childbirth and the puerperium: Secondary | ICD-10-CM | POA: Insufficient documentation

## 2018-12-14 DIAGNOSIS — O210 Mild hyperemesis gravidarum: Secondary | ICD-10-CM | POA: Diagnosis not present

## 2018-12-14 HISTORY — DX: Unspecified abnormal cytological findings in specimens from vagina: R87.629

## 2018-12-14 LAB — URINALYSIS, ROUTINE W REFLEX MICROSCOPIC
Bacteria, UA: NONE SEEN
Bilirubin Urine: NEGATIVE
Glucose, UA: NEGATIVE mg/dL
Ketones, ur: 80 mg/dL — AB
Nitrite: NEGATIVE
Protein, ur: 100 mg/dL — AB
Specific Gravity, Urine: 1.03 (ref 1.005–1.030)
pH: 5 (ref 5.0–8.0)

## 2018-12-14 LAB — CBC
HCT: 42.5 % (ref 36.0–46.0)
Hemoglobin: 13.8 g/dL (ref 12.0–15.0)
MCH: 26.8 pg (ref 26.0–34.0)
MCHC: 32.5 g/dL (ref 30.0–36.0)
MCV: 82.5 fL (ref 80.0–100.0)
Platelets: 678 10*3/uL — ABNORMAL HIGH (ref 150–400)
RBC: 5.15 MIL/uL — ABNORMAL HIGH (ref 3.87–5.11)
RDW: 13.2 % (ref 11.5–15.5)
WBC: 13.8 10*3/uL — ABNORMAL HIGH (ref 4.0–10.5)
nRBC: 0 % (ref 0.0–0.2)

## 2018-12-14 LAB — COMPREHENSIVE METABOLIC PANEL
ALT: 18 U/L (ref 0–44)
AST: 22 U/L (ref 15–41)
Albumin: 4.3 g/dL (ref 3.5–5.0)
Alkaline Phosphatase: 69 U/L (ref 38–126)
Anion gap: 15 (ref 5–15)
BUN: 14 mg/dL (ref 6–20)
CO2: 21 mmol/L — ABNORMAL LOW (ref 22–32)
Calcium: 9.5 mg/dL (ref 8.9–10.3)
Chloride: 101 mmol/L (ref 98–111)
Creatinine, Ser: 0.86 mg/dL (ref 0.44–1.00)
GFR calc Af Amer: >60 mL/min (ref >60)
GFR calc non Af Amer: >60 mL/min (ref >60)
Glucose, Bld: 94 mg/dL (ref 70–99)
Potassium: 3.6 mmol/L (ref 3.5–5.1)
Sodium: 137 mmol/L (ref 135–145)
Total Bilirubin: 1.8 mg/dL — ABNORMAL HIGH (ref 0.3–1.2)
Total Protein: 8.3 g/dL — ABNORMAL HIGH (ref 6.5–8.1)

## 2018-12-14 MED ORDER — METOCLOPRAMIDE HCL 10 MG PO TABS: 10.0000 mg | ORAL_TABLET | Freq: Four times a day (QID) | ORAL | 0 refills | Status: DC

## 2018-12-14 MED ORDER — SODIUM CHLORIDE 0.9 % IV SOLN
25.0000 mg | Freq: Once | INTRAVENOUS | Status: AC
  Administered 2018-12-14: 25 mg via INTRAVENOUS
  Filled 2018-12-14: qty 1

## 2018-12-14 MED ORDER — ONDANSETRON HCL 4 MG/2ML IJ SOLN
4.0000 mg | Freq: Once | INTRAMUSCULAR | Status: AC
  Administered 2018-12-14: 4 mg via INTRAVENOUS
  Filled 2018-12-14: qty 2

## 2018-12-14 MED ORDER — M.V.I. ADULT IV INJ
INJECTION | Freq: Once | INTRAVENOUS | Status: AC
  Administered 2018-12-14: 20:00:00 via INTRAVENOUS
  Filled 2018-12-14: qty 1000

## 2018-12-14 MED ORDER — PROMETHAZINE HCL 12.5 MG PO TABS: 12.5000 mg | ORAL_TABLET | Freq: Four times a day (QID) | ORAL | 0 refills | Status: DC | PRN

## 2018-12-14 NOTE — MAU Provider Note (Signed)
History     CSN: [id number]  Arrival date and time: 12/14/18 1656   First Provider Initiated Contact with Patient 12/14/18 1803      Chief Complaint  Patient presents with  . Emesis  . Abdominal Pain   HPI  Ms.  Jody Robinson is a 28 y.o. year old G62P1001 female at [redacted]w[redacted]d weeks gestation who presents to MAU reporting vomiting x 2-3 wks, not able to keep anything down. She takes Zofran for vomiting, but states that it is not working. She did not take any Zofran did not take it today. She also complains of mid to lower abdominal pain/cramping since this AM. She has no complaints of diarrhea, VB or LOF. She has established [facility] in [place], [place]. Her first appointment is scheduled for Friday 12/18/2018. The last time she ate something was 12/13/2018 @ 0600 and the last time she drank something was at 0730 this AM, but "none of it stay down." She reports she was "sick like this with my daughter 5 yrs ago."   Past Medical History:  Diagnosis Date  . Vaginal Pap smear, abnormal     Past Surgical History:  Procedure Laterality Date  . NO PAST SURGERIES      History reviewed. No pertinent family history.  Social History   Tobacco Use  . Smoking status: Never Smoker  . Smokeless tobacco: Never Used  Substance Use Topics  . Alcohol use: Not Currently    Comment: occasional  . Drug use: No    Allergies: No Known Allergies  Medications Prior to Admission  Medication Sig Dispense Refill Last Dose  . ondansetron (ZOFRAN) 4 MG tablet Take 1 tablet (4 mg total) by mouth every 6 (six) hours. Prn n/v 8 tablet 0 12/13/2018 at Unknown time  . Prenatal Vit-Fe Fumarate-FA (PRENATAL COMPLETE) 14-0.4 MG TABS One a day 60 each 1 Past Week at Unknown time  . azithromycin (ZITHROMAX) 250 MG tablet Take 1 tablet (250 mg total) by mouth daily. Take first 2 tablets together, then 1 every day until finished. 6 tablet 0   . Doxylamine-Pyridoxine (DICLEGIS) 10-10 MG TBEC Take 2 tablets by mouth at bedtime  for 10 days. 20 tablet 0     Review of Systems  Constitutional: Negative.   HENT: Negative.   Eyes: Negative.   Respiratory: Negative.   Gastrointestinal: Positive for abdominal pain, nausea and vomiting. Negative for diarrhea.  Endocrine: Negative.   Musculoskeletal: Negative.   Skin: Negative.   Allergic/Immunologic: Negative.   Neurological: Negative.   Hematological: Negative.   Psychiatric/Behavioral: Negative.    Physical Exam   Blood pressure 112/65, pulse 80, temperature 97.8 F (36.6 C), temperature source Oral, resp. rate 16, weight 64.2 kg, last menstrual period 10/20/2018.  Physical Exam  Nursing note and vitals reviewed. Constitutional: She is oriented to person, place, and time. She appears well-developed and well-nourished.  HENT:  Head: Normocephalic and atraumatic.  Eyes: Pupils are equal, round, and reactive to light.  Neck: Normal range of motion.  Cardiovascular: Normal rate.  Respiratory: Effort normal and breath sounds normal.  GI: Soft. Normal appearance. Bowel sounds are decreased.  Genitourinary:    Genitourinary Comments: Pelvic deferred   Musculoskeletal: Normal range of motion.  Neurological: She is alert and oriented to person, place, and time.  Skin: Skin is warm and dry.  Psychiatric: She has a normal mood and affect. Her behavior is normal. Judgment and thought content normal.    MAU Course  Procedures  MDM CCUA  CBC CMP IVFs: Phenergan 25 mg in LR 1000 ml @ 999 ml/hr; followed by MVI in LR 1000 ml @ 500 ml/hr --  No vomiting, but nausea persists Zofran 4 mg IVPB PO Challenge -- patient tolerated crackers and gingerale well  Results for orders placed or performed during the hospital encounter of 12/14/18 (from the past 24 hour(s))  Urinalysis, Routine w reflex microscopic     Status: Abnormal   Collection Time: 12/14/18  5:28 PM  Result Value Ref Range   Color, Urine AMBER (A) YELLOW   APPearance HAZY (A) CLEAR   Specific  Gravity, Urine 1.030 1.005 - 1.030   pH 5.0 5.0 - 8.0   Glucose, UA NEGATIVE NEGATIVE mg/dL   Hgb urine dipstick MODERATE (A) NEGATIVE   Bilirubin Urine NEGATIVE NEGATIVE   Ketones, ur 80 (A) NEGATIVE mg/dL   Protein, ur 100 (A) NEGATIVE mg/dL   Nitrite NEGATIVE NEGATIVE   Leukocytes,Ua SMALL (A) NEGATIVE   RBC / HPF 0-5 0 - 5 RBC/hpf   WBC, UA 0-5 0 - 5 WBC/hpf   Bacteria, UA NONE SEEN NONE SEEN   Squamous Epithelial / LPF 0-5 0 - 5   Mucus PRESENT    Hyaline Casts, UA PRESENT   CBC     Status: Abnormal   Collection Time: 12/14/18  6:58 PM  Result Value Ref Range   WBC 13.8 (H) 4.0 - 10.5 K/uL   RBC 5.15 (H) 3.87 - 5.11 MIL/uL   Hemoglobin 13.8 12.0 - 15.0 g/dL   HCT 42.5 36.0 - 46.0 %   MCV 82.5 80.0 - 100.0 fL   MCH 26.8 26.0 - 34.0 pg   MCHC 32.5 30.0 - 36.0 g/dL   RDW 13.2 11.5 - 15.5 %   Platelets 678 (H) 150 - 400 K/uL   nRBC 0.0 0.0 - 0.2 %  Comprehensive metabolic panel     Status: Abnormal   Collection Time: 12/14/18  6:58 PM  Result Value Ref Range   Sodium 137 135 - 145 mmol/L   Potassium 3.6 3.5 - 5.1 mmol/L   Chloride 101 98 - 111 mmol/L   CO2 21 (L) 22 - 32 mmol/L   Glucose, Bld 94 70 - 99 mg/dL   BUN 14 6 - 20 mg/dL   Creatinine, Ser 0.86 0.44 - 1.00 mg/dL   Calcium 9.5 8.9 - 10.3 mg/dL   Total Protein 8.3 (H) 6.5 - 8.1 g/dL   Albumin 4.3 3.5 - 5.0 g/dL   AST 22 15 - 41 U/L   ALT 18 0 - 44 U/L   Alkaline Phosphatase 69 38 - 126 U/L   Total Bilirubin 1.8 (H) 0.3 - 1.2 mg/dL   GFR calc non Af Amer >60 >60 mL/min   GFR calc Af Amer >60 >60 mL/min   Anion gap 15 5 - 15    Assessment and Plan  Morning sickness - Plan: Discharge patient - Rx for Phenergan 12.5 mg every 6 hrs (inserted vaginally, if unable to keep down anything) - Information provided on morning sickness - F/U with OB provider in [facility], [place] as scheduled on Friday 5//03/2019 - Patient verbalized an understanding of the plan of care and agrees.   Laury Deep, MSN, CNM 12/14/2018,  6:03 PM

## 2018-12-14 NOTE — MAU Note (Signed)
Pt C/O vomiting x 2-3 weeks, nothing has stayed down.  Has been taking zofran but it isn't working, last took it yesterday.  Also C/O mid & lower abdominal pain since this morning.  Denies bleeding.  No diarrhea.

## 2019-01-15 DIAGNOSIS — O210 Mild hyperemesis gravidarum: Secondary | ICD-10-CM | POA: Insufficient documentation

## 2019-01-25 ENCOUNTER — Other Ambulatory Visit: Payer: Self-pay

## 2019-01-25 ENCOUNTER — Inpatient Hospital Stay (HOSPITAL_COMMUNITY): Payer: 59

## 2019-01-25 ENCOUNTER — Inpatient Hospital Stay (HOSPITAL_COMMUNITY)
Admission: AD | Admit: 2019-01-25 | Discharge: 2019-01-25 | Disposition: A | Discharge status: Home / Self Care | Payer: 59 | Attending: Obstetrics and Gynecology | Admitting: Obstetrics and Gynecology

## 2019-01-25 ENCOUNTER — Encounter (HOSPITAL_COMMUNITY): Payer: Self-pay | Admitting: *Deleted

## 2019-01-25 DIAGNOSIS — E860 Dehydration: Secondary | ICD-10-CM

## 2019-01-25 DIAGNOSIS — R748 Abnormal levels of other serum enzymes: Secondary | ICD-10-CM

## 2019-01-25 DIAGNOSIS — R112 Nausea with vomiting, unspecified: Secondary | ICD-10-CM

## 2019-01-25 DIAGNOSIS — Z3A13 13 weeks gestation of pregnancy: Secondary | ICD-10-CM | POA: Insufficient documentation

## 2019-01-25 DIAGNOSIS — O210 Mild hyperemesis gravidarum: Secondary | ICD-10-CM | POA: Diagnosis present

## 2019-01-25 DIAGNOSIS — O211 Hyperemesis gravidarum with metabolic disturbance: Secondary | ICD-10-CM | POA: Insufficient documentation

## 2019-01-25 LAB — COMPREHENSIVE METABOLIC PANEL
ALT: 239 U/L — ABNORMAL HIGH (ref 0–44)
AST: 154 U/L — ABNORMAL HIGH (ref 15–41)
Albumin: 3.6 g/dL (ref 3.5–5.0)
Alkaline Phosphatase: 103 U/L (ref 38–126)
Anion gap: 17 — ABNORMAL HIGH (ref 5–15)
BUN: 16 mg/dL (ref 6–20)
CO2: 20 mmol/L — ABNORMAL LOW (ref 22–32)
Calcium: 9.2 mg/dL (ref 8.9–10.3)
Chloride: 94 mmol/L — ABNORMAL LOW (ref 98–111)
Creatinine, Ser: 0.91 mg/dL (ref 0.44–1.00)
GFR calc Af Amer: >60 mL/min (ref >60)
GFR calc non Af Amer: >60 mL/min (ref >60)
Glucose, Bld: 106 mg/dL — ABNORMAL HIGH (ref 70–99)
Potassium: 3.8 mmol/L (ref 3.5–5.1)
Sodium: 131 mmol/L — ABNORMAL LOW (ref 135–145)
Total Bilirubin: 1.9 mg/dL — ABNORMAL HIGH (ref 0.3–1.2)
Total Protein: 8 g/dL (ref 6.5–8.1)

## 2019-01-25 LAB — URINALYSIS, ROUTINE W REFLEX MICROSCOPIC
Glucose, UA: 100 mg/dL — AB
Ketones, ur: >80 mg/dL — AB
Nitrite: POSITIVE — AB
Protein, ur: 100 mg/dL — AB
Specific Gravity, Urine: >1.03 — ABNORMAL HIGH (ref 1.005–1.030)
pH: 5.5 (ref 5.0–8.0)

## 2019-01-25 LAB — CBC
HCT: 40.9 % (ref 36.0–46.0)
Hemoglobin: 14.1 g/dL (ref 12.0–15.0)
MCH: 28 pg (ref 26.0–34.0)
MCHC: 34.5 g/dL (ref 30.0–36.0)
MCV: 81.2 fL (ref 80.0–100.0)
Platelets: 660 10*3/uL — ABNORMAL HIGH (ref 150–400)
RBC: 5.04 MIL/uL (ref 3.87–5.11)
RDW: 13.2 % (ref 11.5–15.5)
WBC: 14.3 10*3/uL — ABNORMAL HIGH (ref 4.0–10.5)
nRBC: 0 % (ref 0.0–0.2)

## 2019-01-25 LAB — URINALYSIS, MICROSCOPIC (REFLEX)

## 2019-01-25 LAB — LIPASE, BLOOD: Lipase: 37 U/L (ref 11–51)

## 2019-01-25 MED ORDER — SCOPOLAMINE 1 MG/3DAYS TD PT72
1.0000 | MEDICATED_PATCH | TRANSDERMAL | Status: DC
  Administered 2019-01-25: 13:00:00 1.5 mg via TRANSDERMAL
  Filled 2019-01-25: qty 1

## 2019-01-25 MED ORDER — FAMOTIDINE IN NACL 20-0.9 MG/50ML-% IV SOLN
20.0000 mg | Freq: Once | INTRAVENOUS | Status: AC
  Administered 2019-01-25: 20 mg via INTRAVENOUS
  Filled 2019-01-25: qty 50

## 2019-01-25 MED ORDER — SCOPOLAMINE 1 MG/3DAYS TD PT72: 1.0000 | MEDICATED_PATCH | TRANSDERMAL | 1 refills | Status: DC

## 2019-01-25 MED ORDER — LACTATED RINGERS IV BOLUS
1000.0000 mL | Freq: Once | INTRAVENOUS | Status: AC
  Administered 2019-01-25: 1000 mL via INTRAVENOUS

## 2019-01-25 MED ORDER — ONDANSETRON 8 MG PO TBDP: 8.0000 mg | ORAL_TABLET | Freq: Three times a day (TID) | ORAL | 1 refills | Status: DC | PRN

## 2019-01-25 MED ORDER — FAMOTIDINE 20 MG PO TABS: 20.0000 mg | ORAL_TABLET | Freq: Every day | ORAL | 1 refills | Status: DC

## 2019-01-25 MED ORDER — LACTATED RINGERS IV BOLUS
1000.0000 mL | Freq: Once | INTRAVENOUS | Status: AC
  Administered 2019-01-25: 16:00:00 1000 mL via INTRAVENOUS

## 2019-01-25 MED ORDER — PROMETHAZINE HCL 25 MG/ML IJ SOLN
25.0000 mg | Freq: Four times a day (QID) | INTRAMUSCULAR | Status: DC | PRN
  Administered 2019-01-25: 25 mg via INTRAVENOUS
  Filled 2019-01-25: qty 1

## 2019-01-25 MED ORDER — SODIUM CHLORIDE 0.9 % IV SOLN
8.0000 mg | Freq: Once | INTRAVENOUS | Status: AC
  Administered 2019-01-25: 16:00:00 8 mg via INTRAVENOUS
  Filled 2019-01-25: qty 4

## 2019-01-25 NOTE — MAU Provider Note (Signed)
History     CSN: [id number]  Arrival date and time: 01/25/19 1027   First Provider Initiated Contact with Patient 01/25/19 1139      Chief Complaint  Patient presents with  . Emesis  . Dizziness   G2P1001 @13 .6 wks presenting with N/V and dizziness. N/V has been ongoing during the pregnancy but worsened 2 days ago. She has tried Zofran but didn't help. Dizziness started yesterday. No syncope. She is unable to keep anything down. Denies abd pain, but reports achyness in her lower abdomen. No pregnancy complaints. No urinary sx. No BM in 2 weeks.    OB History    Gravida  2   Para  1   Term  1   Preterm      AB      Living  1     SAB      TAB      Ectopic      Multiple      Live Births              Past Medical History:  Diagnosis Date  . Medical history non-contributory   . Vaginal Pap smear, abnormal     Past Surgical History:  Procedure Laterality Date  . NO PAST SURGERIES      Family History  Problem Relation Age of Onset  . Diabetes Mother     Social History   Tobacco Use  . Smoking status: Never Smoker  . Smokeless tobacco: Never Used  Substance Use Topics  . Alcohol use: Not Currently    Comment: occasional  . Drug use: No    Allergies: No Known Allergies  Medications Prior to Admission  Medication Sig Dispense Refill Last Dose  . metoCLOPramide (REGLAN) 10 MG tablet Take 1 tablet (10 mg total) by mouth every 6 (six) hours. 30 tablet 0 01/24/2019 at 1200  . promethazine (PHENERGAN) 12.5 MG tablet Take 1 tablet (12.5 mg total) by mouth every 6 (six) hours as needed for nausea or vomiting. Insert vaginally, if unable to keep down anything 30 tablet 0 Past Week at Unknown time  . Prenatal Vit-Fe Fumarate-FA (PRENATAL COMPLETE) 14-0.4 MG TABS One a day 60 each 1 More than a month at Unknown time    Review of Systems  Constitutional: Negative for chills and fever.  Gastrointestinal: Positive for constipation, nausea and vomiting.  Negative for abdominal pain and diarrhea.  Genitourinary: Negative for dysuria, frequency, hematuria, urgency and vaginal bleeding.  Musculoskeletal: Negative for back pain.   Physical Exam   Blood pressure 112/64, pulse 91, temperature 99 F (37.2 C), temperature source Oral, resp. rate 20, weight 59 kg, last menstrual period 10/20/2018, SpO2 100 %.  Physical Exam  Nursing note and vitals reviewed. Constitutional: She is oriented to person, place, and time. She appears well-developed and well-nourished. No distress.  HENT:  Head: Normocephalic and atraumatic.  Neck: Normal range of motion.  Respiratory: Effort normal. No respiratory distress.  GI: Soft. She exhibits no distension and no mass. There is no abdominal tenderness. There is no rebound, no guarding and no CVA tenderness.  Musculoskeletal: Normal range of motion.  Neurological: She is alert and oriented to person, place, and time.  Skin: Skin is warm and dry.  Psychiatric: She has a normal mood and affect.  FHT 167  Results for orders placed or performed during the hospital encounter of 01/25/19 (from the past 24 hour(s))  Urinalysis, Routine w reflex microscopic     Status: Abnormal  Collection Time: 01/25/19 12:00 PM  Result Value Ref Range   Color, Urine AMBER (A) YELLOW   APPearance CLOUDY (A) CLEAR   Specific Gravity, Urine >1.030 (H) 1.005 - 1.030   pH 5.5 5.0 - 8.0   Glucose, UA 100 (A) NEGATIVE mg/dL   Hgb urine dipstick LARGE (A) NEGATIVE   Bilirubin Urine LARGE (A) NEGATIVE   Ketones, ur >80 (A) NEGATIVE mg/dL   Protein, ur 100 (A) NEGATIVE mg/dL   Nitrite POSITIVE (A) NEGATIVE   Leukocytes,Ua MODERATE (A) NEGATIVE  Urinalysis, Microscopic (reflex)     Status: Abnormal   Collection Time: 01/25/19 12:00 PM  Result Value Ref Range   RBC / HPF 6-10 0 - 5 RBC/hpf   WBC, UA 11-20 0 - 5 WBC/hpf   Bacteria, UA RARE (A) NONE SEEN   Squamous Epithelial / LPF 6-10 0 - 5   Mucus PRESENT    Hyaline Casts, UA  PRESENT    Urine-Other LESS THAN 10 mL OF URINE SUBMITTED   CBC     Status: Abnormal   Collection Time: 01/25/19  1:27 PM  Result Value Ref Range   WBC 14.3 (H) 4.0 - 10.5 K/uL   RBC 5.04 3.87 - 5.11 MIL/uL   Hemoglobin 14.1 12.0 - 15.0 g/dL   HCT 40.9 36.0 - 46.0 %   MCV 81.2 80.0 - 100.0 fL   MCH 28.0 26.0 - 34.0 pg   MCHC 34.5 30.0 - 36.0 g/dL   RDW 13.2 11.5 - 15.5 %   Platelets 660 (H) 150 - 400 K/uL   nRBC 0.0 0.0 - 0.2 %  Comprehensive metabolic panel     Status: Abnormal   Collection Time: 01/25/19  1:27 PM  Result Value Ref Range   Sodium 131 (L) 135 - 145 mmol/L   Potassium 3.8 3.5 - 5.1 mmol/L   Chloride 94 (L) 98 - 111 mmol/L   CO2 20 (L) 22 - 32 mmol/L   Glucose, Bld 106 (H) 70 - 99 mg/dL   BUN 16 6 - 20 mg/dL   Creatinine, Ser 0.91 0.44 - 1.00 mg/dL   Calcium 9.2 8.9 - 10.3 mg/dL   Total Protein 8.0 6.5 - 8.1 g/dL   Albumin 3.6 3.5 - 5.0 g/dL   AST 154 (H) 15 - 41 U/L   ALT 239 (H) 0 - 44 U/L   Alkaline Phosphatase 103 38 - 126 U/L   Total Bilirubin 1.9 (H) 0.3 - 1.2 mg/dL   GFR calc non Af Amer >60 >60 mL/min   GFR calc Af Amer >60 >60 mL/min   Anion gap 17 (H) 5 - 15  Lipase, blood     Status: None   Collection Time: 01/25/19  1:27 PM  Result Value Ref Range   Lipase 37 11 - 51 U/L   US Abdomen Limited Ruq  Result Date: 01/25/2019 CLINICAL DATA:  Elevated liver enzymes, nausea and vomiting. Thirteen weeks pregnant. EXAM: ULTRASOUND ABDOMEN LIMITED RIGHT UPPER QUADRANT COMPARISON:  None. FINDINGS: Gallbladder: Sludge is present within the gallbladder. No gallstones or wall thickening visualized. No sonographic Murphy sign noted by sonographer. Common bile duct: Diameter: 3 mm Liver: No focal lesion identified. Within normal limits in parenchymal echogenicity. Portal vein is patent on color Doppler imaging with normal direction of blood flow towards the liver. IMPRESSION: 1. Gallbladder sludge. No gallstones identified. No evidence of cholecystitis. 2. No bile  duct dilatation. 3. Liver appears normal. Electronically Signed   By: Roxy Horseman.D.  On: 01/25/2019 15:12   MAU Course  Procedures Orders Placed This Encounter  Procedures  . Culture, OB Urine    Standing Status:   Standing    Number of Occurrences:   1  . US ABDOMEN LIMITED RUQ    Standing Status:   Standing    Number of Occurrences:   1    Order Specific Question:   Symptom/Reason for Exam    Answer:   Elevated liver enzymes [312411]    Order Specific Question:   Symptom/Reason for Exam    Answer:   Nausea & vomiting [161096]  . Urinalysis, Routine w reflex microscopic    Standing Status:   Standing    Number of Occurrences:   1  . Urinalysis, Microscopic (reflex)    Standing Status:   Standing    Number of Occurrences:   1  . CBC    Standing Status:   Standing    Number of Occurrences:   1  . Comprehensive metabolic panel    Standing Status:   Standing    Number of Occurrences:   1  . Lipase, blood    Standing Status:   Standing    Number of Occurrences:   1  . Discharge patient    Order Specific Question:   Discharge disposition    Answer:   01-Home or Self Care [1]    Order Specific Question:   Discharge patient date    Answer:   01/25/2019   Meds ordered this encounter  Medications  . scopolamine (TRANSDERM-SCOP) 1 MG/3DAYS 1.5 mg  . lactated ringers bolus 1,000 mL  . famotidine (PEPCID) IVPB 20 mg premix  . promethazine (PHENERGAN) injection 25 mg  . lactated ringers bolus 1,000 mL  . ondansetron (ZOFRAN) 8 mg in sodium chloride 0.9 % 50 mL IVPB  . scopolamine (TRANSDERM-SCOP) 1 MG/3DAYS    Sig: Place 1 patch (1.5 mg total) onto the skin every 3 (three) days.    Dispense:  10 patch    Refill:  1    Order Specific Question:   Supervising Provider    Answer:   Aletha Halim [id number]  . famotidine (PEPCID) 20 MG tablet    Sig: Take 1 tablet (20 mg total) by mouth at bedtime.    Dispense:  30 tablet    Refill:  1    Order Specific Question:    Supervising Provider    Answer:   Aletha Halim [id number]  . ondansetron (ZOFRAN ODT) 8 MG disintegrating tablet    Sig: Take 1 tablet (8 mg total) by mouth every 8 (eight) hours as needed for nausea or vomiting.    Dispense:  30 tablet    Refill:  1    Order Specific Question:   Supervising Provider    Answer:   Aletha Halim [[id number]]   MDM Chart reviewed: documented 11 lb weight loss since early May. Labs ordered and reviewed. LFT elevated, will order Lipase and RUQ [name]. UA abnormal, no urinary sx or CVA, will send UC. 1545: [name] and Lipase normal, continues to have emesis after Phenergan, Zofran ordered. 1735: Tolerating po, no further emesis, feels better. RUQ [name] normal, just sludge. Will Rx home with Zofran, Pepcid, and Scopolamine. Needs f/u of elevated liver enzymes. Stable for discharge home.   Assessment and Plan  [redacted] weeks gestation HEG Dehydration Elevated liver enzymes Discharge home Follow up at [facility]- has appt scheduled 2 weeks Hydrate Rx Scopolamine Rx Pepcid Rx Zofran Return precautions  Allergies as of 01/25/2019   No Known Allergies     Medication List    STOP taking these medications   metoCLOPramide 10 MG tablet Commonly known as: REGLAN   promethazine 12.5 MG tablet Commonly known as: PHENERGAN     TAKE these medications   famotidine 20 MG tablet Commonly known as: Pepcid Take 1 tablet (20 mg total) by mouth at bedtime.   ondansetron 8 MG disintegrating tablet Commonly known as: Zofran ODT Take 1 tablet (8 mg total) by mouth every 8 (eight) hours as needed for nausea or vomiting.   Prenatal Complete 14-0.4 MG Tabs One a day   scopolamine 1 MG/3DAYS Commonly known as: TRANSDERM-SCOP Place 1 patch (1.5 mg total) onto the skin every 3 (three) days. Start taking on: January 28, 2019      Julianne Handler, [place] 01/25/2019, 5:54 PM

## 2019-01-25 NOTE — MAU Note (Signed)
Can't keep nothing down, been throwing up and been dizzy. Started 2 days ago. Has zofran.

## 2019-01-25 NOTE — Discharge Instructions (Signed)
Hyperemesis Gravidarum Hyperemesis gravidarum is a severe form of nausea and vomiting that happens during pregnancy. Hyperemesis is worse than morning sickness. It may cause you to have nausea or vomiting all day for many days. It may keep you from eating and drinking enough food and liquids, which can lead to dehydration, malnutrition, and weight loss. Hyperemesis usually occurs during the first half (the first 20 weeks) of pregnancy. It often goes away once a woman is in her second half of pregnancy. However, sometimes hyperemesis continues through an entire pregnancy. What are the causes? The cause of this condition is not known. It may be related to changes in chemicals (hormones) in the body during pregnancy, such as the high level of pregnancy hormone (human chorionic gonadotropin) or the increase in the female sex hormone (estrogen). What are the signs or symptoms? Symptoms of this condition include:  Nausea that does not go away.  Vomiting that does not allow you to keep any food down.  Weight loss.  Body fluid loss (dehydration).  Having no desire to eat, or not liking food that you have previously enjoyed. How is this diagnosed? This condition may be diagnosed based on:  A physical exam.  Your medical history.  Your symptoms.  Blood tests.  Urine tests. How is this treated? This condition is managed by controlling symptoms. This may include:  Following an eating plan. This can help lessen nausea and vomiting.  Taking prescription medicines. An eating plan and medicines are often used together to help control symptoms. If medicines do not help relieve nausea and vomiting, you may need to receive fluids through an IV at the hospital. Follow these instructions at home: Eating and drinking   Avoid the following: ? Drinking fluids with meals. Try not to drink anything during the 30 minutes before and after your meals. ? Drinking more than 1 cup of fluid at a  time. ? Eating foods that trigger your symptoms. These may include spicy foods, coffee, high-fat foods, very sweet foods, and acidic foods. ? Skipping meals. Nausea can be more intense on an empty stomach. If you cannot tolerate food, do not force it. Try sucking on ice chips or other frozen items and make up for missed calories later. ? Lying down within 2 hours after eating. ? Being exposed to environmental triggers. These may include food smells, smoky rooms, closed spaces, rooms with strong smells, warm or humid places, overly loud and noisy rooms, and rooms with motion or flickering lights. Try eating meals in a well-ventilated area that is free of strong smells. ? Quick and sudden changes in your movement. ? Taking iron pills and multivitamins that contain iron. If you take prescription iron pills, do not stop taking them unless your health care provider approves. ? Preparing food. The smell of food can spoil your appetite or trigger nausea.  To help relieve your symptoms: ? Listen to your body. Everyone is different and has different preferences. Find what works best for you. ? Eat and drink slowly. ? Eat 5-6 small meals daily instead of 3 large meals. Eating small meals and snacks can help you avoid an empty stomach. ? In the morning, before getting out of bed, eat a couple of crackers to avoid moving around on an empty stomach. ? Try eating starchy foods as these are usually tolerated well. Examples include cereal, toast, bread, potatoes, pasta, rice, and pretzels. ? Include at least 1 serving of protein with your meals and snacks. Protein options include  lean meats, poultry, seafood, beans, nuts, nut butters, eggs, cheese, and yogurt. ? Try eating a protein-rich snack before bed. Examples of a protein-rick snack include cheese and crackers or a peanut butter sandwich made with 1 slice of whole-wheat bread and 1 tsp (5 g) of peanut butter. ? Eat or suck on things that have ginger in them.  It may help relieve nausea. Add  tsp ground ginger to hot tea or choose ginger tea. ? Try drinking 100% fruit juice or an electrolyte drink. An electrolyte drink contains sodium, potassium, and chloride. ? Drink fluids that are cold, clear, and carbonated or sour. Examples include lemonade, ginger ale, lemon-lime soda, ice water, and sparkling water. ? Brush your teeth or use a mouth rinse after meals. ? Talk with your health care provider about starting a supplement of vitamin B6. General instructions  Take over-the-counter and prescription medicines only as told by your health care provider.  Follow instructions from your health care provider about eating or drinking restrictions.  Continue to take your prenatal vitamins as told by your health care provider. If you are having trouble taking your prenatal vitamins, talk with your health care provider about different options.  Keep all follow-up and pre-birth (prenatal) visits as told by your health care provider. This is important. Contact a health care provider if:  You have pain in your abdomen.  You have a severe headache.  You have vision problems.  You are losing weight.  You feel weak or dizzy. Get help right away if:  You cannot drink fluids without vomiting.  You vomit blood.  You have constant nausea and vomiting.  You are very weak.  You faint.  You have a fever and your symptoms suddenly get worse. Summary  Hyperemesis gravidarum is a severe form of nausea and vomiting that happens during pregnancy.  Making some changes to your eating habits may help relieve nausea and vomiting.  This condition may be managed with medicine.  If medicines do not help relieve nausea and vomiting, you may need to receive fluids through an IV at the hospital. This information is not intended to replace advice given to you by your health care provider. Make sure you discuss any questions you have with your health care  provider. Document Released: 07/29/2005 Document Revised: 08/18/2017 Document Reviewed: 03/27/2016 Elsevier Interactive Patient Education  2019 [employer] Inc[employer]   Dehydration, Adult  Dehydration is when there is not enough fluid or water in your body. This happens when you lose more fluids than you take in. Dehydration can range from mild to very bad. It should be treated right away to keep it from getting very bad. Symptoms of mild dehydration may include:  Thirst.  Dry lips.  Slightly dry mouth.  Dry, warm skin.  Dizziness. Symptoms of moderate dehydration may include:  Very dry mouth.  Muscle cramps.  Dark pee (urine). Pee may be the color of tea.  Your body making less pee.  Your eyes making fewer tears.  Heartbeat that is uneven or faster than normal (palpitations).  Headache.  Light-headedness, especially when you stand up from sitting.  Fainting (syncope). Symptoms of very bad dehydration may include:  Changes in skin, such as: ? Cold and clammy skin. ? Blotchy (mottled) or pale skin. ? Skin that does not quickly return to normal after being lightly pinched and let go (poor skin turgor).  Changes in body fluids, such as: ? Feeling very thirsty. ? Your eyes making fewer tears. ? Not  sweating when body temperature is high, such as in hot weather. ? Your body making very little pee.  Changes in vital signs, such as: ? Weak pulse. ? Pulse that is more than 100 beats a minute when you are sitting still. ? Fast breathing. ? Low blood pressure.  Other changes, such as: ? Sunken eyes. ? Cold hands and feet. ? Confusion. ? Lack of energy (lethargy). ? Trouble waking up from sleep. ? Short-term weight loss. ? Unconsciousness. Follow these instructions at home:   If told by your doctor, drink an ORS: ? Make an ORS by using instructions on the package. ? Start by drinking small amounts, about  cup (120 mL) every 5-10 minutes. ? Slowly drink more until  you have had the amount that your doctor said to have.  Drink enough clear fluid to keep your pee clear or pale yellow. If you were told to drink an ORS, finish the ORS first, then start slowly drinking clear fluids. Drink fluids such as: ? Water. Do not drink only water by itself. Doing that can make the salt (sodium) level in your body get too low (hyponatremia). ? Ice chips. ? Fruit juice that you have added water to (diluted). ? Low-calorie sports drinks.  Avoid: ? Alcohol. ? Drinks that have a lot of sugar. These include high-calorie sports drinks, fruit juice that does not have water added, and soda. ? Caffeine. ? Foods that are greasy or have a lot of fat or sugar.  Take over-the-counter and prescription medicines only as told by your doctor.  Do not take salt tablets. Doing that can make the salt level in your body get too high (hypernatremia).  Eat foods that have minerals (electrolytes). Examples include bananas, oranges, potatoes, tomatoes, and spinach.  Keep all follow-up visits as told by your doctor. This is important. Contact a doctor if:  You have belly (abdominal) pain that: ? Gets worse. ? Stays in one area (localizes).  You have a rash.  You have a stiff neck.  You get angry or annoyed more easily than normal (irritability).  You are more sleepy than normal.  You have a harder time waking up than normal.  You feel: ? Weak. ? Dizzy. ? Very thirsty.  You have peed (urinated) only a small amount of very dark pee during 6-8 hours. Get help right away if:  You have symptoms of very bad dehydration.  You cannot drink fluids without throwing up (vomiting).  Your symptoms get worse with treatment.  You have a fever.  You have a very bad headache.  You are throwing up or having watery poop (diarrhea) and it: ? Gets worse. ? Does not go away.  You have blood or something green (bile) in your throw-up.  You have blood in your poop (stool). This may  cause poop to look black and tarry.  You have not peed in 6-8 hours.  You pass out (faint).  Your heart rate when you are sitting still is more than 100 beats a minute.  You have trouble breathing. This information is not intended to replace advice given to you by your health care provider. Make sure you discuss any questions you have with your health care provider. Document Released: 05/25/2009 Document Revised: 02/16/2016 Document Reviewed: 09/22/2015 Elsevier Interactive Patient Education  2019 [employer].

## 2019-01-25 NOTE — Progress Notes (Signed)
c/o dizziness, assisted BTB & siderails up x2

## 2019-01-27 LAB — CULTURE, OB URINE: Culture: >=100000 — AB

## 2019-01-28 ENCOUNTER — Telehealth: Payer: Self-pay | Admitting: Women's Health

## 2019-01-28 NOTE — Telephone Encounter (Signed)
Pt urine culture resulted with a bacteria for which susceptibility is not available. Spoke with Dr. Ilda Basset who recommended calling infectious disease. Spoke with Dr. Baxter Flattery in infectious disease who reports this bacterium is a normal vaginal/uro flora not usually pathogenic and does not usually require treatment. Dr. Baxter Flattery recommends to call pt and if pt is having urinary symptoms to return for a repeat culture as there is likely another bacteria that would be causing an infection.  Called pt, range once, left VM.

## 2019-01-29 ENCOUNTER — Telehealth: Payer: Self-pay | Admitting: Women's Health

## 2019-01-29 NOTE — Telephone Encounter (Signed)
Rang once, no answer, left voicemail. This is the second attempt to contact this patient.  Clarisa Fling, NP  8:27 AM 01/29/2019

## 2019-02-06 ENCOUNTER — Inpatient Hospital Stay (HOSPITAL_COMMUNITY)
Admission: AD | Admit: 2019-02-06 | Discharge: 2019-02-08 | DRG: 779 | Disposition: A | Discharge status: Home / Self Care | Payer: 59 | Attending: Family Medicine | Admitting: Family Medicine

## 2019-02-06 ENCOUNTER — Inpatient Hospital Stay (HOSPITAL_COMMUNITY): Payer: 59

## 2019-02-06 ENCOUNTER — Encounter (HOSPITAL_COMMUNITY): Payer: Self-pay

## 2019-02-06 ENCOUNTER — Other Ambulatory Visit: Payer: Self-pay

## 2019-02-06 DIAGNOSIS — Z8759 Personal history of other complications of pregnancy, childbirth and the puerperium: Secondary | ICD-10-CM

## 2019-02-06 DIAGNOSIS — Z3A15 15 weeks gestation of pregnancy: Secondary | ICD-10-CM | POA: Diagnosis not present

## 2019-02-06 DIAGNOSIS — O021 Missed abortion: Secondary | ICD-10-CM | POA: Diagnosis not present

## 2019-02-06 DIAGNOSIS — O210 Mild hyperemesis gravidarum: Secondary | ICD-10-CM | POA: Diagnosis present

## 2019-02-06 DIAGNOSIS — D75839 Thrombocytosis, unspecified: Secondary | ICD-10-CM

## 2019-02-06 DIAGNOSIS — R945 Abnormal results of liver function studies: Secondary | ICD-10-CM

## 2019-02-06 DIAGNOSIS — O3680X0 Pregnancy with inconclusive fetal viability, not applicable or unspecified: Secondary | ICD-10-CM | POA: Diagnosis not present

## 2019-02-06 DIAGNOSIS — O211 Hyperemesis gravidarum with metabolic disturbance: Secondary | ICD-10-CM | POA: Diagnosis present

## 2019-02-06 DIAGNOSIS — O26832 Pregnancy related renal disease, second trimester: Secondary | ICD-10-CM | POA: Diagnosis present

## 2019-02-06 DIAGNOSIS — Z1159 Encounter for screening for other viral diseases: Secondary | ICD-10-CM

## 2019-02-06 DIAGNOSIS — R7989 Other specified abnormal findings of blood chemistry: Secondary | ICD-10-CM

## 2019-02-06 DIAGNOSIS — D473 Essential (hemorrhagic) thrombocythemia: Secondary | ICD-10-CM

## 2019-02-06 DIAGNOSIS — O080 Genital tract and pelvic infection following ectopic and molar pregnancy: Secondary | ICD-10-CM | POA: Diagnosis present

## 2019-02-06 DIAGNOSIS — E876 Hypokalemia: Secondary | ICD-10-CM | POA: Diagnosis present

## 2019-02-06 DIAGNOSIS — N179 Acute kidney failure, unspecified: Secondary | ICD-10-CM

## 2019-02-06 DIAGNOSIS — N390 Urinary tract infection, site not specified: Secondary | ICD-10-CM

## 2019-02-06 DIAGNOSIS — E860 Dehydration: Secondary | ICD-10-CM

## 2019-02-06 DIAGNOSIS — R1110 Vomiting, unspecified: Secondary | ICD-10-CM

## 2019-02-06 DIAGNOSIS — E871 Hypo-osmolality and hyponatremia: Secondary | ICD-10-CM | POA: Diagnosis present

## 2019-02-06 HISTORY — DX: Personal history of other complications of pregnancy, childbirth and the puerperium: Z87.59

## 2019-02-06 LAB — TYPE AND SCREEN
ABO/RH(D): O POS
Antibody Screen: NEGATIVE

## 2019-02-06 LAB — COMPREHENSIVE METABOLIC PANEL
ALT: 1085 U/L — ABNORMAL HIGH (ref 0–44)
AST: 495 U/L — ABNORMAL HIGH (ref 15–41)
Albumin: 3.2 g/dL — ABNORMAL LOW (ref 3.5–5.0)
Alkaline Phosphatase: 146 U/L — ABNORMAL HIGH (ref 38–126)
Anion gap: 17 — ABNORMAL HIGH (ref 5–15)
BUN: 20 mg/dL (ref 6–20)
CO2: 28 mmol/L (ref 22–32)
Calcium: 9.3 mg/dL (ref 8.9–10.3)
Chloride: 83 mmol/L — ABNORMAL LOW (ref 98–111)
Creatinine, Ser: 1.31 mg/dL — ABNORMAL HIGH (ref 0.44–1.00)
GFR calc Af Amer: >60 mL/min (ref >60)
GFR calc non Af Amer: 55 mL/min — ABNORMAL LOW (ref >60)
Glucose, Bld: 166 mg/dL — ABNORMAL HIGH (ref 70–99)
Potassium: 2.7 mmol/L — CL (ref 3.5–5.1)
Sodium: 128 mmol/L — ABNORMAL LOW (ref 135–145)
Total Bilirubin: 1.8 mg/dL — ABNORMAL HIGH (ref 0.3–1.2)
Total Protein: 7.8 g/dL (ref 6.5–8.1)

## 2019-02-06 LAB — URINALYSIS, ROUTINE W REFLEX MICROSCOPIC
Bilirubin Urine: NEGATIVE
Glucose, UA: NEGATIVE mg/dL
Ketones, ur: 5 mg/dL — AB
Nitrite: NEGATIVE
Protein, ur: 100 mg/dL — AB
Specific Gravity, Urine: 1.025 (ref 1.005–1.030)
WBC, UA: >50 WBC/hpf — ABNORMAL HIGH (ref 0–5)
pH: 6 (ref 5.0–8.0)

## 2019-02-06 LAB — CBC
HCT: 40.5 % (ref 36.0–46.0)
Hemoglobin: 13.8 g/dL (ref 12.0–15.0)
MCH: 27 pg (ref 26.0–34.0)
MCHC: 34.1 g/dL (ref 30.0–36.0)
MCV: 79.1 fL — ABNORMAL LOW (ref 80.0–100.0)
Platelets: 669 10*3/uL — ABNORMAL HIGH (ref 150–400)
RBC: 5.12 MIL/uL — ABNORMAL HIGH (ref 3.87–5.11)
RDW: 12.9 % (ref 11.5–15.5)
WBC: 12.8 10*3/uL — ABNORMAL HIGH (ref 4.0–10.5)
nRBC: 0 % (ref 0.0–0.2)

## 2019-02-06 LAB — RAPID URINE DRUG SCREEN, HOSP PERFORMED
Amphetamines: NOT DETECTED
Barbiturates: NOT DETECTED
Benzodiazepines: NOT DETECTED
Cocaine: NOT DETECTED
Opiates: NOT DETECTED
Tetrahydrocannabinol: NOT DETECTED

## 2019-02-06 LAB — ABO/RH: ABO/RH(D): O POS

## 2019-02-06 LAB — SARS CORONAVIRUS 2 BY RT PCR (HOSPITAL ORDER, PERFORMED IN ~~LOC~~ HOSPITAL LAB): SARS Coronavirus 2: NEGATIVE

## 2019-02-06 MED ORDER — FAMOTIDINE 20 MG PO TABS
20.0000 mg | ORAL_TABLET | Freq: Every day | ORAL | Status: DC
  Administered 2019-02-06: 22:00:00 20 mg via ORAL
  Filled 2019-02-06: qty 1

## 2019-02-06 MED ORDER — ZOLPIDEM TARTRATE 5 MG PO TABS: 5.0000 mg | ORAL_TABLET | Freq: Every evening | ORAL | Status: DC | PRN

## 2019-02-06 MED ORDER — POTASSIUM CHLORIDE 10 MEQ/100ML IV SOLN
10.0000 meq | INTRAVENOUS | Status: AC
  Administered 2019-02-06 (×4): 10 meq via INTRAVENOUS
  Filled 2019-02-06 (×4): qty 100

## 2019-02-06 MED ORDER — CALCIUM CARBONATE ANTACID 500 MG PO CHEW: 2.0000 | CHEWABLE_TABLET | ORAL | Status: DC | PRN

## 2019-02-06 MED ORDER — MISOPROSTOL 200 MCG PO TABS
600.0000 ug | ORAL_TABLET | ORAL | Status: DC
  Administered 2019-02-06 – 2019-02-07 (×6): 600 ug via VAGINAL
  Filled 2019-02-06 (×6): qty 3

## 2019-02-06 MED ORDER — LACTATED RINGERS IV BOLUS
1000.0000 mL | Freq: Once | INTRAVENOUS | Status: AC
  Administered 2019-02-06: 1000 mL via INTRAVENOUS

## 2019-02-06 MED ORDER — ONDANSETRON HCL 4 MG/2ML IJ SOLN
4.0000 mg | Freq: Four times a day (QID) | INTRAMUSCULAR | Status: DC
  Administered 2019-02-06 – 2019-02-07 (×3): 4 mg via INTRAVENOUS
  Filled 2019-02-06 (×4): qty 2

## 2019-02-06 MED ORDER — THIAMINE HCL 100 MG/ML IJ SOLN
Freq: Once | INTRAVENOUS | Status: AC
  Administered 2019-02-06: 16:00:00 via INTRAVENOUS
  Filled 2019-02-06: qty 1000

## 2019-02-06 MED ORDER — SODIUM CHLORIDE 0.9 % IV SOLN
8.0000 mg | Freq: Once | INTRAVENOUS | Status: AC
  Administered 2019-02-06: 13:00:00 8 mg via INTRAVENOUS
  Filled 2019-02-06 (×2): qty 4

## 2019-02-06 MED ORDER — FAMOTIDINE IN NACL 20-0.9 MG/50ML-% IV SOLN
20.0000 mg | Freq: Once | INTRAVENOUS | Status: AC
  Administered 2019-02-06: 12:00:00 20 mg via INTRAVENOUS
  Filled 2019-02-06: qty 50

## 2019-02-06 MED ORDER — SCOPOLAMINE 1 MG/3DAYS TD PT72: 1.0000 | MEDICATED_PATCH | TRANSDERMAL | Status: DC

## 2019-02-06 MED ORDER — FENTANYL CITRATE (PF) 100 MCG/2ML IJ SOLN
100.0000 ug | INTRAMUSCULAR | Status: DC | PRN
  Administered 2019-02-07: 17:00:00 100 ug via INTRAVENOUS
  Filled 2019-02-06: qty 2

## 2019-02-06 MED ORDER — DOCUSATE SODIUM 100 MG PO CAPS
100.0000 mg | ORAL_CAPSULE | Freq: Every day | ORAL | Status: DC
  Filled 2019-02-06: qty 1

## 2019-02-06 MED ORDER — PRENATAL MULTIVITAMIN CH
1.0000 | ORAL_TABLET | Freq: Every day | ORAL | Status: DC
  Administered 2019-02-07: 12:00:00 1 via ORAL
  Filled 2019-02-06: qty 1

## 2019-02-06 MED ORDER — PROMETHAZINE HCL 25 MG/ML IJ SOLN
12.5000 mg | Freq: Four times a day (QID) | INTRAMUSCULAR | Status: DC | PRN
  Administered 2019-02-06: 15:00:00 12.5 mg via INTRAVENOUS
  Filled 2019-02-06: qty 1

## 2019-02-06 MED ORDER — LACTATED RINGERS IV SOLN
INTRAVENOUS | Status: DC
  Administered 2019-02-06 – 2019-02-07 (×4): via INTRAVENOUS

## 2019-02-06 MED ORDER — ONDANSETRON HCL 4 MG/2ML IJ SOLN: 4.0000 mg | Freq: Four times a day (QID) | INTRAMUSCULAR | Status: DC | PRN

## 2019-02-06 MED ORDER — ACETAMINOPHEN 325 MG PO TABS: 650.0000 mg | ORAL_TABLET | ORAL | Status: DC | PRN

## 2019-02-06 NOTE — MAU Note (Signed)
Jody Robinson is a 28 y.o. at [redacted]w[redacted]d here in MAU reporting: nausea, ongoing for several weeks but it is worse. Emesis x 5 in 24 hours. States whenever she stands she gets dizzy. Denies pain or bleeding.   Onset of complaint: ongoing  Pain score: 0/10  Vitals:   02/06/19 1036  BP: (!) 57/42  Pulse: 98  Resp: 18  Temp: 97.7 F (36.5 C)  SpO2: 93%      Lab orders placed from triage: UA

## 2019-02-06 NOTE — H&P (Signed)
Jody Robinson is an 28 y.o. G2P1001 female.   Chief Complaint: nausea and vomiting HPI: G2P1001 @ [redacted]w[redacted]d with mulitple hospital visits for hyperemesis, in today with same. Found to have 15 wk IUFD. Also noted to have AKI, markedly elevated LFTs, low K and generally dehydrated. Down 8-10 kg since early May.  Past Medical History:  Diagnosis Date  . Medical history non-contributory   . Vaginal Pap smear, abnormal     Past Surgical History:  Procedure Laterality Date  . NO PAST SURGERIES      Family History  Problem Relation Age of Onset  . Diabetes Mother    Social History:  reports that she has never smoked. She has never used smokeless tobacco. She reports previous alcohol use. She reports that she does not use drugs.  Allergies: No Known Allergies  Medications Prior to Admission  Medication Sig Dispense Refill  . famotidine (PEPCID) 20 MG tablet Take 1 tablet (20 mg total) by mouth at bedtime. 30 tablet 1  . ondansetron (ZOFRAN ODT) 8 MG disintegrating tablet Take 1 tablet (8 mg total) by mouth every 8 (eight) hours as needed for nausea or vomiting. 30 tablet 1  . Prenatal Vit-Fe Fumarate-FA (PRENATAL COMPLETE) 14-0.4 MG TABS One a day 60 each 1  . scopolamine (TRANSDERM-SCOP) 1 MG/3DAYS Place 1 patch (1.5 mg total) onto the skin every 3 (three) days. 10 patch 1    Pertinent items are noted in HPI.  Blood pressure 108/80, pulse (!) 58, temperature 97.7 F (36.5 C), temperature source Oral, resp. rate 18, height 5' (1.524 m), weight 56.8 kg, last menstrual period 10/20/2018, SpO2 96 %. BP 124/80 Comment: pt is on phone and will not keep arm straight  Pulse (!) 58   Temp 97.7 F (36.5 C) (Oral)   Resp 18   Ht 5' (1.524 m)   Wt 56.8 kg   LMP 10/20/2018 (Exact Date)   SpO2 96%   BMI 24.47 kg/m  General appearance: alert, cooperative and appears stated age Head: Normocephalic, without obvious abnormality, atraumatic Neck: supple, symmetrical, trachea midline Lungs:  normal effort Heart: regular rate and rhythm Abdomen: soft, non-tender; bowel sounds normal; no masses,  no organomegaly Extremities: Homans sign is negative, no sign of DVT Skin: Skin color, texture, turgor normal. No rashes or lesions Neurologic: Grossly normal   Lab Results  Component Value Date   WBC 12.8 (H) 02/06/2019   HGB 13.8 02/06/2019   HCT 40.5 02/06/2019   MCV 79.1 (L) 02/06/2019   PLT 669 (H) 02/06/2019   CMP Latest Ref Rng & Units 02/06/2019 01/25/2019 12/14/2018  Glucose 70 - 99 mg/dL 166(H) 106(H) 94  BUN 6 - 20 mg/dL 20 16 14   Creatinine 0.44 - 1.00 mg/dL 1.31(H) 0.91 0.86  Sodium 135 - 145 mmol/L 128(L) 131(L) 137  Potassium 3.5 - 5.1 mmol/L 2.7(LL) 3.8 3.6  Chloride 98 - 111 mmol/L 83(L) 94(L) 101  CO2 22 - 32 mmol/L 28 20(L) 21(L)  Calcium 8.9 - 10.3 mg/dL 9.3 9.2 9.5  Total Protein 6.5 - 8.1 g/dL 7.8 8.0 8.3(H)  Total Bilirubin 0.3 - 1.2 mg/dL 1.8(H) 1.9(H) 1.8(H)  Alkaline Phos 38 - 126 U/L 146(H) 103 69  AST 15 - 41 U/L 495(H) 154(H) 22  ALT 0 - 44 U/L 1,085(H) 239(H) 18    US Abdomen Limited Ruq  Result Date: 01/25/2019 CLINICAL DATA:  Elevated liver enzymes, nausea and vomiting. Thirteen weeks pregnant. EXAM: ULTRASOUND ABDOMEN LIMITED RIGHT UPPER QUADRANT COMPARISON:  None. FINDINGS: Gallbladder:  Sludge is present within the gallbladder. No gallstones or wall thickening visualized. No sonographic Murphy sign noted by sonographer. Common bile duct: Diameter: 3 mm Liver: No focal lesion identified. Within normal limits in parenchymal echogenicity. Portal vein is patent on color Doppler imaging with normal direction of blood flow towards the liver. IMPRESSION: 1. Gallbladder sludge. No gallstones identified. No evidence of cholecystitis. 2. No bile duct dilatation. 3. Liver appears normal. Electronically Signed   By: Franki Cabot M.D.   On: 01/25/2019 15:12    Bedside and formal u/s show 15 wk fetal demise Assessment/Plan   ICD-10-CM   1. Hyperemesis   R11.10   2. Pregnancy with inconclusive fetal viability  O36.80X0 [name] MFM OB LIMITED    [name] MFM OB LIMITED  3. Pregnancy with inconclusive fetal viability, single or unspecified fetus  O36.80X0 [name] MFM OB LIMITED  4. Dehydration  E86.0   5. Acute kidney injury ([place])  N17.9   6. Abnormal liver function tests  R94.5   7. Thrombocytosis ([facility])  D47.3   8. Fetal demise before 20 weeks with retention of dead fetus  O02.1    For admission, rehydration--aggressive Check Hepatitis panel Correct K If no improvement in LFT's and serum Cr with hydration, appropriate consultations will be obtained. Cytotec to affect delivery--offered autopsy, genetic screening--she is considering.    Jody Robinson 02/06/2019, 1:17 PM

## 2019-02-06 NOTE — MAU Provider Note (Signed)
History     CSN: [id number]  Arrival date and time: 02/06/19 1660   First Provider Initiated Contact with Patient 02/06/19 1054      Chief Complaint  Patient presents with  . Nausea  . Emesis  . Dizziness   G2P1001 @15 .4 presenting with dizziness and near syncope. Sx have been ongoing for 3 weeks but worsened today. Walking and standing makes sx worse. She's also had ongoing HEG and ran out of Zofran 2 days ago. Reports emesis 5 times today. She was given Scopolamine also but did not continue it as she felt it was worsening her N/V. Denies VB or abd pain.     OB History    Gravida  2   Para  1   Term  1   Preterm      AB      Living  1     SAB      TAB      Ectopic      Multiple      Live Births              Past Medical History:  Diagnosis Date  . Medical history non-contributory   . Vaginal Pap smear, abnormal     Past Surgical History:  Procedure Laterality Date  . NO PAST SURGERIES      Family History  Problem Relation Age of Onset  . Diabetes Mother     Social History   Tobacco Use  . Smoking status: Never Smoker  . Smokeless tobacco: Never Used  Substance Use Topics  . Alcohol use: Not Currently    Comment: occasional  . Drug use: No    Allergies: No Known Allergies  Medications Prior to Admission  Medication Sig Dispense Refill Last Dose  . famotidine (PEPCID) 20 MG tablet Take 1 tablet (20 mg total) by mouth at bedtime. 30 tablet 1   . ondansetron (ZOFRAN ODT) 8 MG disintegrating tablet Take 1 tablet (8 mg total) by mouth every 8 (eight) hours as needed for nausea or vomiting. 30 tablet 1   . Prenatal Vit-Fe Fumarate-FA (PRENATAL COMPLETE) 14-0.4 MG TABS One a day 60 each 1   . scopolamine (TRANSDERM-SCOP) 1 MG/3DAYS Place 1 patch (1.5 mg total) onto the skin every 3 (three) days. 10 patch 1     Review of Systems  Constitutional: Negative for fever.  Gastrointestinal: Positive for nausea and vomiting. Negative for  abdominal pain.  Genitourinary: Negative for vaginal bleeding.  Neurological: Positive for dizziness and light-headedness. Negative for syncope.   Physical Exam   Blood pressure 124/80, pulse (!) 58, temperature 97.7 F (36.5 C), temperature source Oral, resp. rate 18, height 5' (1.524 m), weight 56.8 kg, last menstrual period 10/20/2018, SpO2 96 %.  Physical Exam  Nursing note and vitals reviewed. Constitutional: She is oriented to person, place, and time. She appears well-developed and well-nourished. No distress.  HENT:  Head: Normocephalic and atraumatic.  Neck: Normal range of motion.  Cardiovascular: Normal rate.  Respiratory: Effort normal. No respiratory distress.  GI: Soft. She exhibits no distension. There is no abdominal tenderness.  Musculoskeletal: Normal range of motion.  Neurological: She is alert and oriented to person, place, and time.  Skin: Skin is warm and dry.  Psychiatric: She has a normal mood and affect.   Results for orders placed or performed during the hospital encounter of 02/06/19 (from the past 24 hour(s))  CBC     Status: Abnormal   Collection Time: 02/06/19  11:27 AM  Result Value Ref Range   WBC 12.8 (H) 4.0 - 10.5 K/uL   RBC 5.12 (H) 3.87 - 5.11 MIL/uL   Hemoglobin 13.8 12.0 - 15.0 g/dL   HCT 40.5 36.0 - 46.0 %   MCV 79.1 (L) 80.0 - 100.0 fL   MCH 27.0 26.0 - 34.0 pg   MCHC 34.1 30.0 - 36.0 g/dL   RDW 12.9 11.5 - 15.5 %   Platelets 669 (H) 150 - 400 K/uL   nRBC 0.0 0.0 - 0.2 %  Comprehensive metabolic panel     Status: Abnormal   Collection Time: 02/06/19 11:27 AM  Result Value Ref Range   Sodium 128 (L) 135 - 145 mmol/L   Potassium 2.7 (LL) 3.5 - 5.1 mmol/L   Chloride 83 (L) 98 - 111 mmol/L   CO2 28 22 - 32 mmol/L   Glucose, Bld 166 (H) 70 - 99 mg/dL   BUN 20 6 - 20 mg/dL   Creatinine, Ser 1.31 (H) 0.44 - 1.00 mg/dL   Calcium 9.3 8.9 - 10.3 mg/dL   Total Protein 7.8 6.5 - 8.1 g/dL   Albumin 3.2 (L) 3.5 - 5.0 g/dL   AST 495 (H) 15 -  41 U/L   ALT 1,085 (H) 0 - 44 U/L   Alkaline Phosphatase 146 (H) 38 - 126 U/L   Total Bilirubin 1.8 (H) 0.3 - 1.2 mg/dL   GFR calc non Af Amer 55 (L) >60 mL/min   GFR calc Af Amer >60 >60 mL/min   Anion gap 17 (H) 5 - 15  Urinalysis, Routine w reflex microscopic     Status: Abnormal   Collection Time: 02/06/19 12:33 PM  Result Value Ref Range   Color, Urine AMBER (A) YELLOW   APPearance CLOUDY (A) CLEAR   Specific Gravity, Urine 1.025 1.005 - 1.030   pH 6.0 5.0 - 8.0   Glucose, UA NEGATIVE NEGATIVE mg/dL   Hgb urine dipstick SMALL (A) NEGATIVE   Bilirubin Urine NEGATIVE NEGATIVE   Ketones, ur 5 (A) NEGATIVE mg/dL   Protein, ur 100 (A) NEGATIVE mg/dL   Nitrite NEGATIVE NEGATIVE   Leukocytes,Ua MODERATE (A) NEGATIVE   RBC / HPF 21-50 0 - 5 RBC/hpf   WBC, UA >50 (H) 0 - 5 WBC/hpf   Bacteria, UA RARE (A) NONE SEEN   Squamous Epithelial / LPF 11-20 0 - 5   WBC Clumps PRESENT    MAU Course  Procedures LR Zofran Pepcid K runs x4  MDM Unable to get FHT by doppler, bedside US shows no cardiac activity, formal US ordered. [name] confirmed IUFD. Pt requesting another look. Dr. Kennon Rounds in for bedside US and confirmed IUFD. Dr. Kennon Rounds made aware of elevated LFTs and Ct, and low K. Plan for admit.   Assessment and Plan  15 week IUFD Admit to [facility] unit [facility] per MD  Julianne Handler, CNM 02/06/2019, 2:06 PM

## 2019-02-06 NOTE — MAU Note (Signed)
Doppler attempted, unable to hear [facility]

## 2019-02-06 NOTE — MAU Note (Signed)
Lab called with critical potassium level of 2.7. Julianne Handler CNM notified. No new orders received.  Primary RN made aware of this lab result.

## 2019-02-06 NOTE — Progress Notes (Signed)
Pt is refusing to take cytotec until her cousin can come be with her. Pt states, "I do not want to be by myself." Dr. Kennon Rounds made aware.

## 2019-02-06 NOTE — Progress Notes (Signed)
Paged chaplain. Waiting to hear back.

## 2019-02-07 DIAGNOSIS — Z1159 Encounter for screening for other viral diseases: Secondary | ICD-10-CM | POA: Diagnosis not present

## 2019-02-07 DIAGNOSIS — Z3A15 15 weeks gestation of pregnancy: Secondary | ICD-10-CM | POA: Diagnosis not present

## 2019-02-07 DIAGNOSIS — E871 Hypo-osmolality and hyponatremia: Secondary | ICD-10-CM | POA: Diagnosis present

## 2019-02-07 DIAGNOSIS — O210 Mild hyperemesis gravidarum: Secondary | ICD-10-CM | POA: Diagnosis present

## 2019-02-07 DIAGNOSIS — N179 Acute kidney failure, unspecified: Secondary | ICD-10-CM

## 2019-02-07 DIAGNOSIS — E876 Hypokalemia: Secondary | ICD-10-CM | POA: Diagnosis not present

## 2019-02-07 DIAGNOSIS — O021 Missed abortion: Secondary | ICD-10-CM | POA: Diagnosis present

## 2019-02-07 DIAGNOSIS — R945 Abnormal results of liver function studies: Secondary | ICD-10-CM

## 2019-02-07 DIAGNOSIS — O26832 Pregnancy related renal disease, second trimester: Secondary | ICD-10-CM | POA: Diagnosis present

## 2019-02-07 DIAGNOSIS — O080 Genital tract and pelvic infection following ectopic and molar pregnancy: Secondary | ICD-10-CM | POA: Diagnosis present

## 2019-02-07 DIAGNOSIS — R7989 Other specified abnormal findings of blood chemistry: Secondary | ICD-10-CM

## 2019-02-07 DIAGNOSIS — N390 Urinary tract infection, site not specified: Secondary | ICD-10-CM

## 2019-02-07 DIAGNOSIS — O211 Hyperemesis gravidarum with metabolic disturbance: Secondary | ICD-10-CM | POA: Diagnosis present

## 2019-02-07 LAB — PROTEIN / CREATININE RATIO, URINE
Creatinine, Urine: 327.49 mg/dL
Protein Creatinine Ratio: 0.21 mg/mg{Cre} — ABNORMAL HIGH (ref 0.00–0.15)
Total Protein, Urine: 69 mg/dL

## 2019-02-07 LAB — COMPREHENSIVE METABOLIC PANEL
ALT: 682 U/L — ABNORMAL HIGH (ref 0–44)
ALT: 550 U/L — ABNORMAL HIGH (ref 0–44)
AST: 261 U/L — ABNORMAL HIGH (ref 15–41)
AST: 153 U/L — ABNORMAL HIGH (ref 15–41)
Albumin: 2.4 g/dL — ABNORMAL LOW (ref 3.5–5.0)
Albumin: 2.3 g/dL — ABNORMAL LOW (ref 3.5–5.0)
Alkaline Phosphatase: 100 U/L (ref 38–126)
Alkaline Phosphatase: 88 U/L (ref 38–126)
Anion gap: 12 (ref 5–15)
Anion gap: 7 (ref 5–15)
BUN: 5 mg/dL — ABNORMAL LOW (ref 6–20)
BUN: <5 mg/dL — ABNORMAL LOW (ref 6–20)
CO2: 27 mmol/L (ref 22–32)
CO2: 28 mmol/L (ref 22–32)
Calcium: 8.1 mg/dL — ABNORMAL LOW (ref 8.9–10.3)
Calcium: 8 mg/dL — ABNORMAL LOW (ref 8.9–10.3)
Chloride: 93 mmol/L — ABNORMAL LOW (ref 98–111)
Chloride: 98 mmol/L (ref 98–111)
Creatinine, Ser: 1.09 mg/dL — ABNORMAL HIGH (ref 0.44–1.00)
Creatinine, Ser: 0.87 mg/dL (ref 0.44–1.00)
GFR calc Af Amer: >60 mL/min (ref >60)
GFR calc Af Amer: >60 mL/min (ref >60)
GFR calc non Af Amer: >60 mL/min (ref >60)
GFR calc non Af Amer: >60 mL/min (ref >60)
Glucose, Bld: 123 mg/dL — ABNORMAL HIGH (ref 70–99)
Glucose, Bld: 93 mg/dL (ref 70–99)
Potassium: 2.7 mmol/L — CL (ref 3.5–5.1)
Potassium: 3 mmol/L — ABNORMAL LOW (ref 3.5–5.1)
Sodium: 132 mmol/L — ABNORMAL LOW (ref 135–145)
Sodium: 133 mmol/L — ABNORMAL LOW (ref 135–145)
Total Bilirubin: 1 mg/dL (ref 0.3–1.2)
Total Bilirubin: 0.9 mg/dL (ref 0.3–1.2)
Total Protein: 5.4 g/dL — ABNORMAL LOW (ref 6.5–8.1)
Total Protein: 5.2 g/dL — ABNORMAL LOW (ref 6.5–8.1)

## 2019-02-07 LAB — CBC
HCT: 31.1 % — ABNORMAL LOW (ref 36.0–46.0)
Hemoglobin: 10.6 g/dL — ABNORMAL LOW (ref 12.0–15.0)
MCH: 27.2 pg (ref 26.0–34.0)
MCHC: 34.1 g/dL (ref 30.0–36.0)
MCV: 79.7 fL — ABNORMAL LOW (ref 80.0–100.0)
Platelets: 526 10*3/uL — ABNORMAL HIGH (ref 150–400)
RBC: 3.9 MIL/uL (ref 3.87–5.11)
RDW: 13 % (ref 11.5–15.5)
WBC: 12.8 10*3/uL — ABNORMAL HIGH (ref 4.0–10.5)
nRBC: 0 % (ref 0.0–0.2)

## 2019-02-07 LAB — HIV ANTIBODY (ROUTINE TESTING W REFLEX): HIV Screen 4th Generation wRfx: NONREACTIVE

## 2019-02-07 LAB — DIC (DISSEMINATED INTRAVASCULAR COAGULATION)PANEL
D-Dimer, Quant: 0.75 ug/mL-FEU — ABNORMAL HIGH (ref 0.00–0.50)
Fibrinogen: 542 mg/dL — ABNORMAL HIGH (ref 210–475)
INR: 1.2 (ref 0.8–1.2)
Platelets: 550 10*3/uL — ABNORMAL HIGH (ref 150–400)
Prothrombin Time: 15.5 seconds — ABNORMAL HIGH (ref 11.4–15.2)
Smear Review: NONE SEEN
aPTT: 30 seconds (ref 24–36)

## 2019-02-07 LAB — MAGNESIUM
Magnesium: 1.9 mg/dL (ref 1.7–2.4)
Magnesium: 1.7 mg/dL (ref 1.7–2.4)

## 2019-02-07 LAB — SODIUM, URINE, RANDOM: Sodium, Ur: <10 mmol/L

## 2019-02-07 MED ORDER — OXYTOCIN 10 UNIT/ML IJ SOLN
INTRAMUSCULAR | Status: AC
  Filled 2019-02-07: qty 4

## 2019-02-07 MED ORDER — SODIUM CHLORIDE 0.9 % IV SOLN
INTRAVENOUS | Status: DC
  Administered 2019-02-07: 15:00:00 via INTRAVENOUS

## 2019-02-07 MED ORDER — IBUPROFEN 600 MG PO TABS: 600.0000 mg | ORAL_TABLET | Freq: Four times a day (QID) | ORAL | Status: DC

## 2019-02-07 MED ORDER — ONDANSETRON HCL 4 MG PO TABS: 4.0000 mg | ORAL_TABLET | ORAL | Status: DC | PRN

## 2019-02-07 MED ORDER — AMOXICILLIN 500 MG PO CAPS
500.0000 mg | ORAL_CAPSULE | Freq: Three times a day (TID) | ORAL | Status: DC
  Administered 2019-02-07 (×2): 500 mg via ORAL
  Filled 2019-02-07 (×2): qty 1

## 2019-02-07 MED ORDER — TETANUS-DIPHTH-ACELL PERTUSSIS 5-2.5-18.5 LF-MCG/0.5 IM SUSP: 0.5000 mL | Freq: Once | INTRAMUSCULAR | Status: DC

## 2019-02-07 MED ORDER — SENNOSIDES-DOCUSATE SODIUM 8.6-50 MG PO TABS: 2.0000 | ORAL_TABLET | ORAL | Status: DC

## 2019-02-07 MED ORDER — OXYTOCIN 40 UNITS IN NORMAL SALINE INFUSION - SIMPLE MED
INTRAVENOUS | Status: AC
  Administered 2019-02-07: 19:00:00 40 [IU]/h via INTRAVENOUS
  Filled 2019-02-07: qty 1000

## 2019-02-07 MED ORDER — ZOLPIDEM TARTRATE 5 MG PO TABS: 5.0000 mg | ORAL_TABLET | Freq: Every evening | ORAL | Status: DC | PRN

## 2019-02-07 MED ORDER — MISOPROSTOL 200 MCG PO TABS
800.0000 ug | ORAL_TABLET | Freq: Once | ORAL | Status: AC
  Administered 2019-02-07: 19:00:00 800 ug via VAGINAL
  Filled 2019-02-07: qty 4

## 2019-02-07 MED ORDER — COCONUT OIL OIL: 1.0000 "application " | TOPICAL_OIL | Status: DC | PRN

## 2019-02-07 MED ORDER — SIMETHICONE 80 MG PO CHEW: 80.0000 mg | CHEWABLE_TABLET | ORAL | Status: DC | PRN

## 2019-02-07 MED ORDER — OXYTOCIN 10 UNIT/ML IJ SOLN
40.0000 [IU] | Freq: Once | INTRAMUSCULAR | Status: AC
  Administered 2019-02-07: 19:00:00 40 [IU]

## 2019-02-07 MED ORDER — POTASSIUM CHLORIDE CRYS ER 20 MEQ PO TBCR
40.0000 meq | EXTENDED_RELEASE_TABLET | Freq: Two times a day (BID) | ORAL | Status: DC
  Administered 2019-02-07: 10:00:00 40 meq via ORAL
  Filled 2019-02-07: qty 2

## 2019-02-07 MED ORDER — BENZOCAINE-MENTHOL 20-0.5 % EX AERO: 1.0000 "application " | INHALATION_SPRAY | CUTANEOUS | Status: DC | PRN

## 2019-02-07 MED ORDER — DIPHENHYDRAMINE HCL 25 MG PO CAPS: 25.0000 mg | ORAL_CAPSULE | Freq: Four times a day (QID) | ORAL | Status: DC | PRN

## 2019-02-07 MED ORDER — WITCH HAZEL-GLYCERIN EX PADS: 1.0000 "application " | MEDICATED_PAD | CUTANEOUS | Status: DC | PRN

## 2019-02-07 MED ORDER — DIBUCAINE (PERIANAL) 1 % EX OINT: 1.0000 "application " | TOPICAL_OINTMENT | CUTANEOUS | Status: DC | PRN

## 2019-02-07 MED ORDER — ACETAMINOPHEN 325 MG PO TABS: 650.0000 mg | ORAL_TABLET | ORAL | Status: DC | PRN

## 2019-02-07 MED ORDER — ONDANSETRON HCL 4 MG/2ML IJ SOLN: 4.0000 mg | INTRAMUSCULAR | Status: DC | PRN

## 2019-02-07 MED ORDER — OXYTOCIN 40 UNITS IN NORMAL SALINE INFUSION - SIMPLE MED
40.0000 [IU]/h | INTRAVENOUS | Status: DC
  Administered 2019-02-07: 19:00:00 40 [IU]/h via INTRAVENOUS

## 2019-02-07 MED ORDER — OXYCODONE HCL 5 MG PO TABS
5.0000 mg | ORAL_TABLET | ORAL | Status: DC | PRN
  Administered 2019-02-07 – 2019-02-08 (×3): 5 mg via ORAL
  Filled 2019-02-07 (×3): qty 1

## 2019-02-07 MED ORDER — PRENATAL MULTIVITAMIN CH: 1.0000 | ORAL_TABLET | Freq: Every day | ORAL | Status: DC

## 2019-02-07 NOTE — Progress Notes (Signed)
[facility] NOTE  Jody Robinson is a 28 y.o. G2P1001 at [redacted]w[redacted]d who is admitted for IUFD, hyperemesis.  Estimated Date of Delivery: 07/27/19  Length of Stay:  0 Days. Admitted 02/06/2019  Subjective:  Patient reports she is feeling small amount of cramping but otherwise doing well.   Vitals:  Blood pressure 110/68, pulse (!) 106, temperature 98.2 F (36.8 C), temperature source Oral, resp. rate 16, height 5' (1.524 m), weight 56.8 kg, last menstrual period 10/20/2018, SpO2 100 %. Physical Examination: CONSTITUTIONAL: Well-developed, well-nourished female in no acute distress.  HENT:  Normocephalic, atraumatic, External right and left ear normal. Oropharynx is clear and moist EYES: Conjunctivae and EOM are normal. Pupils are equal, round, and reactive to light. No scleral icterus.  NECK: Normal range of motion, supple, no masses. SKIN: Skin is warm and dry. No rash noted. Not diaphoretic. No erythema. No pallor. [place]: Alert and oriented to person, place, and time. Normal reflexes, muscle tone coordination. No cranial nerve deficit noted. PSYCHIATRIC: Normal mood and affect. Normal behavior. Normal judgment and thought content. CARDIOVASCULAR: Normal heart rate noted RESPIRATORY: Effort normal, no problems with respiration noted MUSCULOSKELETAL: Normal range of motion. No edema and no tenderness. ABDOMEN: Soft, nontender, nondistended, gravid. CERVIX: 3 cm dilated, bulging bag, AROM with exam   I have reviewed the patient's current medications.  ASSESSMENT: Active Problems:   Morning sickness   IUFD at less than 20 weeks of gestation   Hyponatremia   Hypokalemia   PLAN: - Patient 3 cm dilated with no delivery of fetus or placenta, AROM with exam, placed 600 mcg cytotec vaginally - pain well controlled   Continue routine antenatal care.   Feliz Beam, M.D. [facility] [employer] [facility] (Faculty Practice)  02/07/2019 4:52 PM

## 2019-02-07 NOTE — Progress Notes (Signed)
Patient ID: Jody Robinson, female   DOB: 19-Nov-1990, 28 y.o.   MRN: [id number]   Subjective: Interval History:s/p several doses of cytotec. She is not uncomfortable. Her LFTs are improving, her potassium remains quite low. Her serum Cr is improving  Objective: Vital signs in last 24 hours: Temp:  [97.7 F (36.5 C)-99.2 F (37.3 C)] 99.1 F (37.3 C) (06/28 0808) Pulse Rate:  [58-132] 123 (06/28 0808) Resp:  [16-18] 16 (06/28 0808) BP: (57-125)/(42-93) 119/66 (06/28 0808) SpO2:  [93 %-100 %] 99 % (06/28 0808) Weight:  [56.8 kg] 56.8 kg (06/27 1032)  Intake/Output from previous day: 06/27 0701 - 06/28 0700 In: 1734.2 [P.O.:960; I.V.:423.1] Out: 1650 [Urine:1650] Intake/Output this shift: No intake/output data recorded.  General appearance: alert, cooperative and appears stated age Lungs: normal effort Heart: regular rate and rhythm Abdomen: soft, non-tender; bowel sounds normal; no masses,  no organomegaly Extremities: extremities normal, atraumatic, no cyanosis or edema  Results for orders placed or performed during the hospital encounter of 02/06/19 (from the past 24 hour(s))  CBC     Status: Abnormal   Collection Time: 02/06/19 11:27 AM  Result Value Ref Range   WBC 12.8 (H) 4.0 - 10.5 K/uL   RBC 5.12 (H) 3.87 - 5.11 MIL/uL   Hemoglobin 13.8 12.0 - 15.0 g/dL   HCT 40.5 36.0 - 46.0 %   MCV 79.1 (L) 80.0 - 100.0 fL   MCH 27.0 26.0 - 34.0 pg   MCHC 34.1 30.0 - 36.0 g/dL   RDW 12.9 11.5 - 15.5 %   Platelets 669 (H) 150 - 400 K/uL   nRBC 0.0 0.0 - 0.2 %  Comprehensive metabolic panel     Status: Abnormal   Collection Time: 02/06/19 11:27 AM  Result Value Ref Range   Sodium 128 (L) 135 - 145 mmol/L   Potassium 2.7 (LL) 3.5 - 5.1 mmol/L   Chloride 83 (L) 98 - 111 mmol/L   CO2 28 22 - 32 mmol/L   Glucose, Bld 166 (H) 70 - 99 mg/dL   BUN 20 6 - 20 mg/dL   Creatinine, Ser 1.31 (H) 0.44 - 1.00 mg/dL   Calcium 9.3 8.9 - 10.3 mg/dL   Total Protein 7.8 6.5 - 8.1 g/dL   Albumin 3.2 (L) 3.5 - 5.0 g/dL   AST 495 (H) 15 - 41 U/L   ALT 1,085 (H) 0 - 44 U/L   Alkaline Phosphatase 146 (H) 38 - 126 U/L   Total Bilirubin 1.8 (H) 0.3 - 1.2 mg/dL   GFR calc non Af Amer 55 (L) >60 mL/min   GFR calc Af Amer >60 >60 mL/min   Anion gap 17 (H) 5 - 15  Type and screen [place]     Status: None   Collection Time: 02/06/19 11:27 AM  Result Value Ref Range   ABO/RH(D) O POS    Antibody Screen NEG    Sample Expiration      02/09/2019,2359 Performed at [facility], [address] [place], [place] [address]   ABO/Rh     Status: None   Collection Time: 02/06/19 11:27 AM  Result Value Ref Range   ABO/RH(D)      O POS Performed at [facility] [address], [place], [place] [address]   Urine rapid drug screen (hosp performed)not at [facility]     Status: None   Collection Time: 02/06/19 12:29 PM  Result Value Ref Range   Opiates NONE DETECTED NONE DETECTED  Cocaine NONE DETECTED NONE DETECTED   Benzodiazepines NONE DETECTED NONE DETECTED   Amphetamines NONE DETECTED NONE DETECTED   Tetrahydrocannabinol NONE DETECTED NONE DETECTED   Barbiturates NONE DETECTED NONE DETECTED  Urinalysis, Routine w reflex microscopic     Status: Abnormal   Collection Time: 02/06/19 12:33 PM  Result Value Ref Range   Color, Urine AMBER (A) YELLOW   APPearance CLOUDY (A) CLEAR   Specific Gravity, Urine 1.025 1.005 - 1.030   pH 6.0 5.0 - 8.0   Glucose, UA NEGATIVE NEGATIVE mg/dL   Hgb urine dipstick SMALL (A) NEGATIVE   Bilirubin Urine NEGATIVE NEGATIVE   Ketones, ur 5 (A) NEGATIVE mg/dL   Protein, ur 100 (A) NEGATIVE mg/dL   Nitrite NEGATIVE NEGATIVE   Leukocytes,Ua MODERATE (A) NEGATIVE   RBC / HPF 21-50 0 - 5 RBC/hpf   WBC, UA >50 (H) 0 - 5 WBC/hpf   Bacteria, UA RARE (A) NONE SEEN   Squamous Epithelial / LPF 11-20 0 - 5   WBC Clumps PRESENT   SARS Coronavirus 2 (CEPHEID - Performed in [place] hospital lab), Hosp Order     Status: None    Collection Time: 02/06/19  1:41 PM   Specimen: Nasopharyngeal Swab  Result Value Ref Range   SARS Coronavirus 2 NEGATIVE NEGATIVE  DIC (disseminated intravasc coag) panel     Status: Abnormal   Collection Time: 02/06/19 10:47 PM  Result Value Ref Range   Prothrombin Time 15.5 (H) 11.4 - 15.2 seconds   INR 1.2 0.8 - 1.2   aPTT 30 24 - 36 seconds   Fibrinogen 542 (H) 210 - 475 mg/dL   D-Dimer, Quant 0.75 (H) 0.00 - 0.50 ug/mL-FEU   Platelets 550 (H) 150 - 400 K/uL   Smear Review NO SCHISTOCYTES SEEN   CBC on admission     Status: Abnormal   Collection Time: 02/07/19  6:25 AM  Result Value Ref Range   WBC 12.8 (H) 4.0 - 10.5 K/uL   RBC 3.90 3.87 - 5.11 MIL/uL   Hemoglobin 10.6 (L) 12.0 - 15.0 g/dL   HCT 31.1 (L) 36.0 - 46.0 %   MCV 79.7 (L) 80.0 - 100.0 fL   MCH 27.2 26.0 - 34.0 pg   MCHC 34.1 30.0 - 36.0 g/dL   RDW 13.0 11.5 - 15.5 %   Platelets 526 (H) 150 - 400 K/uL   nRBC 0.0 0.0 - 0.2 %  Comprehensive metabolic panel     Status: Abnormal   Collection Time: 02/07/19  6:25 AM  Result Value Ref Range   Sodium 132 (L) 135 - 145 mmol/L   Potassium 2.7 (LL) 3.5 - 5.1 mmol/L   Chloride 93 (L) 98 - 111 mmol/L   CO2 27 22 - 32 mmol/L   Glucose, Bld 123 (H) 70 - 99 mg/dL   BUN 5 (L) 6 - 20 mg/dL   Creatinine, Ser 1.09 (H) 0.44 - 1.00 mg/dL   Calcium 8.1 (L) 8.9 - 10.3 mg/dL   Total Protein 5.4 (L) 6.5 - 8.1 g/dL   Albumin 2.4 (L) 3.5 - 5.0 g/dL   AST 261 (H) 15 - 41 U/L   ALT 682 (H) 0 - 44 U/L   Alkaline Phosphatase 100 38 - 126 U/L   Total Bilirubin 1.0 0.3 - 1.2 mg/dL   GFR calc non Af Amer >60 >60 mL/min   GFR calc Af Amer >60 >60 mL/min   Anion gap 12 5 - 15    Studies/Results: US  Abdomen Limited Ruq  Result Date: 01/25/2019 CLINICAL DATA:  Elevated liver enzymes, nausea and vomiting. Thirteen weeks pregnant. EXAM: ULTRASOUND ABDOMEN LIMITED RIGHT UPPER QUADRANT COMPARISON:  None. FINDINGS: Gallbladder: Sludge is present within the gallbladder. No gallstones or wall  thickening visualized. No sonographic Murphy sign noted by sonographer. Common bile duct: Diameter: 3 mm Liver: No focal lesion identified. Within normal limits in parenchymal echogenicity. Portal vein is patent on color Doppler imaging with normal direction of blood flow towards the liver. IMPRESSION: 1. Gallbladder sludge. No gallstones identified. No evidence of cholecystitis. 2. No bile duct dilatation. 3. Liver appears normal. Electronically Signed   By: Franki Cabot M.D.   On: 01/25/2019 15:12    Scheduled Meds: . amoxicillin  500 mg Oral Q8H  . docusate sodium  100 mg Oral Daily  . famotidine  20 mg Oral QHS  . misoprostol  600 mcg Vaginal Q4H  . ondansetron (ZOFRAN) IV  4 mg Intravenous Q6H  . potassium chloride  40 mEq Oral BID  . prenatal multivitamin  1 tablet Oral Q1200  . scopolamine  1 patch Transdermal Q72H   Continuous Infusions: . lactated ringers 150 mL/hr at 02/07/19 0539   PRN Meds:calcium carbonate, fentaNYL (SUBLIMAZE) injection, promethazine, zolpidem  Assessment/Plan: Patient Active Problem List   Diagnosis Date Noted  . IUFD at less than 20 weeks of gestation 02/06/2019  . Morning sickness 12/14/2018   Continue rehydration Continue cytotec Change to po K replacement, check Mag Treat UTI with amoxil.   LOS: 0 days   [name], MD 02/07/2019 8:24 AM

## 2019-02-07 NOTE — Progress Notes (Signed)
Called by RN for delivery of placenta. Placenta at introitus, removed easily. Appears intact. Nothing palpated in vagina. Patient tolerated well. Placenta to path. EBL: 600 mL total.   Patient to stay overnight for monitoring for hypokalemia and hyponatremia.    Feliz Beam, M.D. Attending Center for [employer] [facility] manager)

## 2019-02-07 NOTE — Progress Notes (Signed)
Called by RN for delivery. Non-viable fetus at introitus delivered easily. Cord clamped and cut and infant placed into towel. Approx 300 mL clot evacuated from vagina. Placenta remains in uterus. 40 mg pitocin given in cord, 40 mg pitocin in NS IV bolus and 800 mcg cytotec given rectally. Patient with minimal pain. Will await placental delivery.   Feliz Beam, M.D. Attending Center for [employer] [facility] manager)

## 2019-02-07 NOTE — Discharge Summary (Signed)
Postpartum Discharge Summary     Patient Name: Jody Robinson DOB: [birth year] MRN: [id number]  Date of admission: 02/06/2019 Delivering Provider: This patient has no babies on file.  Date of discharge: 02/08/2019  Admitting diagnosis: BODY ACHES  Intrauterine pregnancy: [redacted]w[redacted]d     Secondary diagnosis:  Active Problems:   Morning sickness   IUFD at less than 20 weeks of gestation   Hyponatremia   Hypokalemia   Abnormal LFTs   Acute kidney failure ([place])   UTI (urinary tract infection)     Discharge diagnosis: IUFD @ 15 weeks and severe dehydration                                                                                                Hospital course:  Induction of Labor With Vaginal Delivery   28 y.o. yo G2P1001 at [redacted]w[redacted]d was admitted to the hospital 02/06/2019 for induction of labor.  Indication for induction: IUFD, hypokalemia, hyponatremia, elevated LFTs, AKI.  She received several doses of cytotec and delivered a non-viable infant. Delivered placenta shortly thereafter.   Details of delivery can be found in separate delivery note.  Patient had a routine postpartum course. Her hypokalemia was corrected orally. Her LFTs and Cr were normalizing. Patient is discharged home 02/08/19.   Physical exam  Vitals:   02/07/19 2038 02/07/19 2256 02/08/19 0418 02/08/19 0741  BP: 116/79 120/77 106/68 118/71  Pulse: (!) 114 (!) 109 (!) 108 (!) 105  Resp: 18 20 18 18   Temp: 99.4 F (37.4 C) 98.8 F (37.1 C) 97.9 F (36.6 C) 97.6 F (36.4 C)  TempSrc: Oral Oral Oral Oral  SpO2: 98% 98% 99% 100%  Weight:      Height:       General: alert, cooperative and no distress Lochia: appropriate Uterine Fundus: firm DVT Evaluation: No evidence of DVT seen on physical exam. Labs: Lab Results  Component Value Date   WBC 17.4 (H) 02/08/2019   HGB 8.0 (L) 02/08/2019   HCT 24.2 (L) 02/08/2019   MCV 83.2 02/08/2019   PLT 423 (H) 02/08/2019   CMP Latest Ref Rng & Units 02/08/2019   Glucose 70 - 99 mg/dL 109(H)  BUN 6 - 20 mg/dL <5(L)  Creatinine 0.44 - 1.00 mg/dL 0.80  Sodium 135 - 145 mmol/L 134(L)  Potassium 3.5 - 5.1 mmol/L 3.0(L)  Chloride 98 - 111 mmol/L 98  CO2 22 - 32 mmol/L 27  Calcium 8.9 - 10.3 mg/dL 7.4(L)  Total Protein 6.5 - 8.1 g/dL 4.6(L)  Total Bilirubin 0.3 - 1.2 mg/dL 0.5  Alkaline Phos 38 - 126 U/L 83  AST 15 - 41 U/L 82(H)  ALT 0 - 44 U/L 356(H)    Discharge instruction: per After Visit Summary and "Baby and Me Booklet".  After visit meds:  Allergies as of 02/08/2019   No Known Allergies     Medication List    STOP taking these medications   famotidine 20 MG tablet Commonly known as: Pepcid   Prenatal Complete 14-0.4 MG Tabs     TAKE these medications   docusate sodium 100 MG  capsule Commonly known as: COLACE Take 100 mg by mouth 2 (two) times daily as needed for mild constipation.   ferrous sulfate 325 (65 FE) MG tablet Commonly known as: FerrouSul Take 1 tablet (325 mg total) by mouth 2 (two) times daily.   ibuprofen 600 MG tablet Commonly known as: ADVIL Take 1 tablet (600 mg total) by mouth every 6 (six) hours as needed.   metoCLOPramide 10 MG tablet Commonly known as: REGLAN Take 10 mg by mouth every 8 (eight) hours as needed for nausea or vomiting.   ondansetron 8 MG disintegrating tablet Commonly known as: Zofran ODT Take 1 tablet (8 mg total) by mouth every 8 (eight) hours as needed for nausea or vomiting.   potassium chloride SA 20 MEQ tablet Commonly known as: K-DUR Take 2 tablets (40 mEq total) by mouth 2 (two) times daily.   scopolamine 1 MG/3DAYS Commonly known as: TRANSDERM-SCOP Place 1 patch (1.5 mg total) onto the skin every 3 (three) days.       Diet: routine diet  Activity: Advance as tolerated. Pelvic rest for 6 weeks.   Outpatient follow up:2 weeks Follow up Appt:No future appointments. Follow up Visit: [place] [facility] Follow up.    Specialty: Obstetrics and Gynecology Why: An appointment will be scheduled for you to follow up in 2 weeks Contact information: [address] [place], [place] [id number] [place] [address] [phone]           Please schedule this patient for Postpartum visit in: 4 weeks with the following provider: MD For C/S patients schedule nurse incision check in weeks 2 weeks: no High risk pregnancy complicated by: IUFD, hyperemesis Delivery mode:  SVD Anticipated Birth Control:  other/unsure PP Procedures needed: [place]  Schedule Integrated [place] visit: yes      Newborn Data: Disposition:morgue

## 2019-02-07 NOTE — Progress Notes (Signed)
Vaginal delivery of non-viable infant at 18:48.

## 2019-02-08 ENCOUNTER — Encounter (HOSPITAL_COMMUNITY): Payer: Self-pay

## 2019-02-08 LAB — COMPREHENSIVE METABOLIC PANEL
ALT: 356 U/L — ABNORMAL HIGH (ref 0–44)
AST: 82 U/L — ABNORMAL HIGH (ref 15–41)
Albumin: 2 g/dL — ABNORMAL LOW (ref 3.5–5.0)
Alkaline Phosphatase: 83 U/L (ref 38–126)
Anion gap: 9 (ref 5–15)
BUN: <5 mg/dL — ABNORMAL LOW (ref 6–20)
CO2: 27 mmol/L (ref 22–32)
Calcium: 7.4 mg/dL — ABNORMAL LOW (ref 8.9–10.3)
Chloride: 98 mmol/L (ref 98–111)
Creatinine, Ser: 0.8 mg/dL (ref 0.44–1.00)
GFR calc Af Amer: >60 mL/min (ref >60)
GFR calc non Af Amer: >60 mL/min (ref >60)
Glucose, Bld: 109 mg/dL — ABNORMAL HIGH (ref 70–99)
Potassium: 3 mmol/L — ABNORMAL LOW (ref 3.5–5.1)
Sodium: 134 mmol/L — ABNORMAL LOW (ref 135–145)
Total Bilirubin: 0.5 mg/dL (ref 0.3–1.2)
Total Protein: 4.6 g/dL — ABNORMAL LOW (ref 6.5–8.1)

## 2019-02-08 LAB — HEPATITIS PANEL, ACUTE
HCV Ab: <0.1 s/co ratio (ref 0.0–0.9)
Hep A IgM: NEGATIVE
Hep B C IgM: NEGATIVE
Hepatitis B Surface Ag: NEGATIVE

## 2019-02-08 LAB — CBC
HCT: 24.2 % — ABNORMAL LOW (ref 36.0–46.0)
Hemoglobin: 8 g/dL — ABNORMAL LOW (ref 12.0–15.0)
MCH: 27.5 pg (ref 26.0–34.0)
MCHC: 33.1 g/dL (ref 30.0–36.0)
MCV: 83.2 fL (ref 80.0–100.0)
Platelets: 423 10*3/uL — ABNORMAL HIGH (ref 150–400)
RBC: 2.91 MIL/uL — ABNORMAL LOW (ref 3.87–5.11)
RDW: 13.2 % (ref 11.5–15.5)
WBC: 17.4 10*3/uL — ABNORMAL HIGH (ref 4.0–10.5)
nRBC: 0 % (ref 0.0–0.2)

## 2019-02-08 MED ORDER — POTASSIUM CHLORIDE CRYS ER 20 MEQ PO TBCR: 40.0000 meq | EXTENDED_RELEASE_TABLET | Freq: Two times a day (BID) | ORAL | 0 refills | Status: DC

## 2019-02-08 MED ORDER — IBUPROFEN 600 MG PO TABS: 600.0000 mg | ORAL_TABLET | Freq: Four times a day (QID) | ORAL | 3 refills | Status: DC | PRN

## 2019-02-08 MED ORDER — FERROUS SULFATE 325 (65 FE) MG PO TABS: 325.0000 mg | ORAL_TABLET | Freq: Two times a day (BID) | ORAL | 1 refills | Status: DC

## 2019-02-08 NOTE — Progress Notes (Signed)
Initial visit with Jody Robinson upon referral from social work.  Introduced spiritual care services and offered support upon the early delivery after the IUFD of her son around 12 weeks.  Pt shared that she was just in the hospital a couple of weeks ago for nausea and thought that she was experiencing the same when she found out her baby had died and then had to deliver him.  At the time of the visit, baby was on her bed and pt was alone in her room.  She shared that her mother was with her during delivery and she has good support at home.  She also has a 29 year old daughter.  Pt was quiet and ocassionally had a tear falling down our cheek during our conversation.  We discussed her hopes for this baby and her grief about losing him.  She is able to take some time off of work with [employer] and hopes that will help her to recover physically (she is having a lot of pain in her legs) and emotionally.  We also discussed some local resources for continued grief support for her and her daughter and she is aware that we will continue to be a resource after her discharge as well.  Please page as further needs arise.  Donald Prose. Elyn Peers, M.Div. [facility] Chaplain Pager [phone] Office [phone]       02/08/19 1000  Clinical Encounter Type  Visited With Patient  Visit Type Initial;Spiritual support;Death  Referral From Social work  Stress Factors  Patient Stress Factors Loss

## 2019-02-08 NOTE — Progress Notes (Signed)
CSW received consult due to IUFD.  CSW available for support secondary to Spiritual Care Services and will await call from Chaplain before becoming involved.  CSW screening out referral at this time.  Ondra Deboard Boyd-Gilyard, MSW, LCSW Clinical Social Work [phone]  

## 2019-02-08 NOTE — Discharge Instructions (Signed)
Hyperemesis Gravidarum Hyperemesis gravidarum is a severe form of nausea and vomiting that happens during pregnancy. Hyperemesis is worse than morning sickness. It may cause you to have nausea or vomiting all day for many days. It may keep you from eating and drinking enough food and liquids, which can lead to dehydration, malnutrition, and weight loss. Hyperemesis usually occurs during the first half (the first 20 weeks) of pregnancy. It often goes away once a woman is in her second half of pregnancy. However, sometimes hyperemesis continues through an entire pregnancy. What are the causes? The cause of this condition is not known. It may be related to changes in chemicals (hormones) in the body during pregnancy, such as the high level of pregnancy hormone (human chorionic gonadotropin) or the increase in the female sex hormone (estrogen). What are the signs or symptoms? Symptoms of this condition include:  Nausea that does not go away.  Vomiting that does not allow you to keep any food down.  Weight loss.  Body fluid loss (dehydration).  Having no desire to eat, or not liking food that you have previously enjoyed. How is this diagnosed? This condition may be diagnosed based on:  A physical exam.  Your medical history.  Your symptoms.  Blood tests.  Urine tests. How is this treated? This condition is managed by controlling symptoms. This may include:  Following an eating plan. This can help lessen nausea and vomiting.  Taking prescription medicines. An eating plan and medicines are often used together to help control symptoms. If medicines do not help relieve nausea and vomiting, you may need to receive fluids through an IV at the hospital. Follow these instructions at home: Eating and drinking   Avoid the following: ? Drinking fluids with meals. Try not to drink anything during the 30 minutes before and after your meals. ? Drinking more than 1 cup of fluid at a  time. ? Eating foods that trigger your symptoms. These may include spicy foods, coffee, high-fat foods, very sweet foods, and acidic foods. ? Skipping meals. Nausea can be more intense on an empty stomach. If you cannot tolerate food, do not force it. Try sucking on ice chips or other frozen items and make up for missed calories later. ? Lying down within 2 hours after eating. ? Being exposed to environmental triggers. These may include food smells, smoky rooms, closed spaces, rooms with strong smells, warm or humid places, overly loud and noisy rooms, and rooms with motion or flickering lights. Try eating meals in a well-ventilated area that is free of strong smells. ? Quick and sudden changes in your movement. ? Taking iron pills and multivitamins that contain iron. If you take prescription iron pills, do not stop taking them unless your health care provider approves. ? Preparing food. The smell of food can spoil your appetite or trigger nausea.  To help relieve your symptoms: ? Listen to your body. Everyone is different and has different preferences. Find what works best for you. ? Eat and drink slowly. ? Eat 5-6 small meals daily instead of 3 large meals. Eating small meals and snacks can help you avoid an empty stomach. ? In the morning, before getting out of bed, eat a couple of crackers to avoid moving around on an empty stomach. ? Try eating starchy foods as these are usually tolerated well. Examples include cereal, toast, bread, potatoes, pasta, rice, and pretzels. ? Include at least 1 serving of protein with your meals and snacks. Protein options include  lean meats, poultry, seafood, beans, nuts, nut butters, eggs, cheese, and yogurt. ? Try eating a protein-rich snack before bed. Examples of a protein-rick snack include cheese and crackers or a peanut butter sandwich made with 1 slice of whole-wheat bread and 1 tsp (5 g) of peanut butter. ? Eat or suck on things that have ginger in them.  It may help relieve nausea. Add  tsp ground ginger to hot tea or choose ginger tea. ? Try drinking 100% fruit juice or an electrolyte drink. An electrolyte drink contains sodium, potassium, and chloride. ? Drink fluids that are cold, clear, and carbonated or sour. Examples include lemonade, ginger ale, lemon-lime soda, ice water, and sparkling water. ? Brush your teeth or use a mouth rinse after meals. ? Talk with your health care provider about starting a supplement of vitamin B6. General instructions  Take over-the-counter and prescription medicines only as told by your health care provider.  Follow instructions from your health care provider about eating or drinking restrictions.  Continue to take your prenatal vitamins as told by your health care provider. If you are having trouble taking your prenatal vitamins, talk with your health care provider about different options.  Keep all follow-up and pre-birth (prenatal) visits as told by your health care provider. This is important. Contact a health care provider if:  You have pain in your abdomen.  You have a severe headache.  You have vision problems.  You are losing weight.  You feel weak or dizzy. Get help right away if:  You cannot drink fluids without vomiting.  You vomit blood.  You have [place] nausea and vomiting.  You are very weak.  You faint.  You have a fever and your symptoms suddenly get worse. Summary  Hyperemesis gravidarum is a severe form of nausea and vomiting that happens during pregnancy.  Making some changes to your eating habits may help relieve nausea and vomiting.  This condition may be managed with medicine.  If medicines do not help relieve nausea and vomiting, you may need to receive fluids through an IV at the hospital. This information is not intended to replace advice given to you by your health care provider. Make sure you discuss any questions you have with your health care  provider. Document Released: 07/29/2005 Document Revised: 08/18/2017 Document Reviewed: 03/27/2016 Elsevier Patient Education  2020 [employer].

## 2019-02-09 LAB — CULTURE, OB URINE: Culture: >=100000 — AB

## 2019-02-10 ENCOUNTER — Telehealth: Payer: Self-pay | Admitting: Obstetrics and Gynecology

## 2019-02-10 NOTE — Telephone Encounter (Signed)
Spoke with patient about her appointments. She asked me to call her back tomorrow, she was not able to do anything right now. She did request to have her appointment before the 27th. She also requested to see a provider face to face.

## 2019-02-15 ENCOUNTER — Ambulatory Visit (INDEPENDENT_AMBULATORY_CARE_PROVIDER_SITE_OTHER): Payer: 59 | Admitting: Clinical

## 2019-02-15 ENCOUNTER — Other Ambulatory Visit: Payer: Self-pay

## 2019-02-15 DIAGNOSIS — F4321 Adjustment disorder with depressed mood: Secondary | ICD-10-CM

## 2019-02-15 DIAGNOSIS — F5102 Adjustment insomnia: Secondary | ICD-10-CM

## 2019-02-15 NOTE — BH Specialist Note (Signed)
Integrated Behavioral Health via Telemedicine Video Visit  02/15/2019 Jody Robinson [id number]  Number of [place] visits: 1 Session Start time: 3:25  Session End time: 4:28 Total time: 1 hour  Referring Provider: Mora Bellman, MD Type of Visit: Video Patient/Family location: Home [facility] Provider location: [facility] All persons participating in visit: Patient Jody Robinson and [place]  Confirmed patient's address: Yes  Confirmed patient's phone number: Yes  Any changes to demographics: No   Confirmed patient's insurance: Yes  Any changes to patient's insurance: No   Discussed confidentiality: Yes   I connected with [name] by a video enabled telemedicine application and verified that I am speaking with the correct person using two identifiers.     I discussed the limitations of evaluation and management by telemedicine and the availability of in person appointments.  I discussed that the purpose of this visit is to provide behavioral health care while limiting exposure to the novel coronavirus.   Discussed there is a possibility of technology failure and discussed alternative modes of communication if that failure occurs.  I discussed that engaging in this video visit, they consent to the provision of behavioral healthcare and the services will be billed under their insurance.  Patient and/or legal guardian expressed understanding and consented to video visit: Yes   PRESENTING CONCERNS: Patient and/or family reports the following symptoms/concerns: Pt states her primary concern today is grieving the loss of her baby boy, feeling weak with full-body aches and pain, headaches, gums feeling numb, with a weird taste in her mouth, fatigue, no energy, and being unable to sleep more than 2 hours at a time since her loss. Pt has not regained her appetite, mostly eats chili cheese fries and water only. Pt requests referral to psychiatry.   Duration of problem: Postartum after IUFD; Severity of problem: moderate  STRENGTHS (Protective Factors/Coping Skills): Supportive family  GOALS ADDRESSED: Patient will: 1.  Reduce symptoms of: compulsions  2.  Increase knowledge and/or ability of: healthy habits  3.  Demonstrate ability to: Increase healthy adjustment to current life circumstances and Begin healthy grieving over loss  INTERVENTIONS: Interventions utilized:  Motivational Interviewing and Psychoeducation and/or Health Education Standardized Assessments completed: GAD-7 and PHQ 9  ASSESSMENT: Patient currently experiencing Grief.   Patient may benefit from psychoeducation and brief therapeutic interventions regarding coping with symptoms of current grief, and referral to psychiatry for further assessment.  Marland Kitchen  PLAN: 1. Follow up with behavioral health clinician on : Two weeks  2. Behavioral recommendations:  -Continue taking iron and potassium, as prescribed by medical provider; consider continuing multivitamin daily  -Replace Cookout chili cheese fries at least once/week with Nena Polio chili baked potato(skin included) until return to work (baked potato=high potassium); consider burger with tomato(iron w vitamin c) with the potato same-day -Increase ocean or thunder sleep sound from occasional to every night at bedtime 3. Referral(s): [place] (In Clinic) and Psychiatrist  I discussed the assessment and treatment plan with the patient and/or parent/guardian. They were provided an opportunity to ask questions and all were answered. They agreed with the plan and demonstrated an understanding of the instructions.   They were advised to call back or seek an in-person evaluation if the symptoms worsen or if the condition fails to improve as anticipated.  Caroleen Hamman McMannes  Depression screen [facility] 2/9 02/15/2019  Decreased Interest 3  Down, Depressed, Hopeless 3  PHQ - 2 Score 6  Altered sleeping  3  Tired, decreased  energy 3  Change in appetite 2  Feeling bad or failure about yourself  0  Trouble concentrating 0  Moving slowly or fidgety/restless 1  Suicidal thoughts 0  PHQ-9 Score 15   GAD 7 : Generalized Anxiety Score 02/15/2019  Nervous, Anxious, on Edge 0  Control/stop worrying 0  Worry too much - different things 0  Trouble relaxing 3  Restless 2  Easily annoyed or irritable 3  Afraid - awful might happen 0  Total GAD 7 Score 8

## 2019-02-16 ENCOUNTER — Telehealth: Payer: Self-pay | Admitting: Clinical

## 2019-02-16 MED ORDER — ZOLPIDEM TARTRATE 5 MG PO TABS: 5.0000 mg | ORAL_TABLET | Freq: Every evening | ORAL | 1 refills | Status: DC | PRN

## 2019-02-16 NOTE — Progress Notes (Signed)
Psychiatry referral ordered.  Also ordered Ambien as needed for insomnia.  Please let patient know about this prescription, and referral details.  Verita Schneiders, MD, [place] for [employer], [place]

## 2019-02-16 NOTE — Addendum Note (Signed)
Addended by: Verita Schneiders A on: 02/16/2019 12:58 PM   Modules accepted: Orders

## 2019-02-16 NOTE — Telephone Encounter (Signed)
Follow up call to let patient know that Dr. Harolyn Rutherford has sent a prescription of Ambien to her pharmacy, and to take as prescribed. Pt says thank you, and looks forward to improving her sleep this week.

## 2019-03-05 ENCOUNTER — Other Ambulatory Visit: Payer: Self-pay

## 2019-03-05 ENCOUNTER — Ambulatory Visit (INDEPENDENT_AMBULATORY_CARE_PROVIDER_SITE_OTHER): Payer: 59 | Admitting: Obstetrics & Gynecology

## 2019-03-05 ENCOUNTER — Encounter: Payer: Self-pay | Admitting: Obstetrics & Gynecology

## 2019-03-05 DIAGNOSIS — N760 Acute vaginitis: Secondary | ICD-10-CM

## 2019-03-05 DIAGNOSIS — O021 Missed abortion: Secondary | ICD-10-CM

## 2019-03-05 DIAGNOSIS — G44209 Tension-type headache, unspecified, not intractable: Secondary | ICD-10-CM

## 2019-03-05 DIAGNOSIS — B373 Candidiasis of vulva and vagina: Secondary | ICD-10-CM

## 2019-03-05 DIAGNOSIS — B9689 Other specified bacterial agents as the cause of diseases classified elsewhere: Secondary | ICD-10-CM | POA: Diagnosis not present

## 2019-03-05 DIAGNOSIS — B3731 Acute candidiasis of vulva and vagina: Secondary | ICD-10-CM

## 2019-03-05 DIAGNOSIS — Z124 Encounter for screening for malignant neoplasm of cervix: Secondary | ICD-10-CM | POA: Diagnosis not present

## 2019-03-05 DIAGNOSIS — N898 Other specified noninflammatory disorders of vagina: Secondary | ICD-10-CM | POA: Diagnosis not present

## 2019-03-05 DIAGNOSIS — A5901 Trichomonal vulvovaginitis: Secondary | ICD-10-CM

## 2019-03-05 DIAGNOSIS — Z113 Encounter for screening for infections with a predominantly sexual mode of transmission: Secondary | ICD-10-CM | POA: Diagnosis not present

## 2019-03-05 MED ORDER — BUTALBITAL-APAP-CAFFEINE 50-325-40 MG PO CAPS: 1.0000 | ORAL_CAPSULE | Freq: Four times a day (QID) | ORAL | 3 refills | Status: DC | PRN

## 2019-03-05 NOTE — Patient Instructions (Addendum)
Return to clinic for any scheduled appointments or for any gynecologic concerns as needed.       RE: MyChart  Dear Ms. Ezelle  MyChart makes it easy for you to view your health information - all in one secure location - from any computer or mobile device at any time. Use the activation code below to enroll in MyChart online at [email or web address]   Once your account is activated, you can:  Marland Kitchen View your test results. . Communicate securely with your physician's office.  . View your medical history, allergies, medications, and immunizations. . Receive care virtually through an e-Visit.   If you are over 18, you may use features of MyChart to manage the health information of your spouse, children or others you care for.  Download child and adult access forms at [email or web address].    As you activate your MyChart account and need any technical assistance, please call the MyChart technical support line at [phone]).  Be sure to also download the MyChart app for your mobile device.   Thank you for choosing [facility] for your family's health care needs!   MyChart Activation Code:  [email or web address] Expires: 03/27/2019  4:47 PM             [facility]  [address] [place], [place] [address]

## 2019-03-05 NOTE — Progress Notes (Signed)
Pt states did not want to talk with Roselyn Reef, has an appt w/ behavioral Health on 04/01/19.

## 2019-03-05 NOTE — Progress Notes (Signed)
Subjective:     Jody Robinson is a 28 y.o. female who presents for a postpartum check. She is 4 weeks postpartum following a induced vaginal delivery of demised fetus at [redacted]w[redacted]d. I have fully reviewed the prenatal and intrapartum course; complicated by hypokalemia, hyponatremia, elevated LFTs, AKIs. Anesthesia: IV sedation. Postpartum course has been complicated by depression due to grief and insomnia; has already been evaluated by Roselyn Reef, does not want to see her again for now.  Denies HI/SI. Bleeding staining only. Bowel function is normal. Bladder function is normal. Patient is not sexually active. Contraception method is none. Postpartum depression screening: positive. Reports headaches, needs medication other than Ibuprofen.  The following portions of the patient's history were reviewed and updated as appropriate: allergies, current medications, past family history, past medical history, past social history, past surgical history and problem list. Normal pap in 2017.  Review of Systems Pertinent items noted in HPI and remainder of comprehensive ROS otherwise negative.   Objective:    BP 121/84   Pulse 84   Temp 98.3 F (36.8 C)   Ht 5' (1.524 m)   Wt 137 lb 3.2 oz (62.2 kg)   Breastfeeding No   BMI 26.80 kg/m   General:  alert and no distress   Breasts:  deferred  Lungs: clear to auscultation bilaterally  Heart:  regular rate and rhythm  Abdomen: soft, non-tender; bowel sounds normal; no masses,  no organomegaly   Vulva:  normal  Vagina: normal vagina and yellow discharge seen, testing sample obtained  Cervix:  multiparous appearance and prominent friable ectropion, moderate bleeding after Pap  Corpus: normal  Adnexa:  not evaluated  Rectal Exam: Not performed.        Assessment:     Postpartum exam done. Pap smear done at today's visit.   Plan:    1. Contraception: none, declined for now 2. Evaluated pap and vaginal discharge, will follow up results and manage  accordingly. 3. Fioricet for headaches 4. Follow up  as needed.    Verita Schneiders, MD, [place] for [employer], [place]

## 2019-03-08 LAB — CERVICOVAGINAL ANCILLARY ONLY
Bacterial vaginitis: POSITIVE — AB
Candida vaginitis: POSITIVE — AB
Trichomonas: POSITIVE — AB

## 2019-03-08 LAB — CYTOLOGY - PAP
Chlamydia: NEGATIVE
Diagnosis: NEGATIVE
Neisseria Gonorrhea: NEGATIVE

## 2019-03-09 DIAGNOSIS — A5901 Trichomonal vulvovaginitis: Secondary | ICD-10-CM | POA: Insufficient documentation

## 2019-03-09 DIAGNOSIS — B3731 Acute candidiasis of vulva and vagina: Secondary | ICD-10-CM | POA: Insufficient documentation

## 2019-03-09 DIAGNOSIS — B9689 Other specified bacterial agents as the cause of diseases classified elsewhere: Secondary | ICD-10-CM | POA: Insufficient documentation

## 2019-03-09 DIAGNOSIS — N760 Acute vaginitis: Secondary | ICD-10-CM | POA: Insufficient documentation

## 2019-03-09 DIAGNOSIS — B373 Candidiasis of vulva and vagina: Secondary | ICD-10-CM | POA: Insufficient documentation

## 2019-03-09 MED ORDER — FLUCONAZOLE 150 MG PO TABS: 150.0000 mg | ORAL_TABLET | Freq: Once | ORAL | 3 refills | Status: AC

## 2019-03-09 MED ORDER — METRONIDAZOLE 500 MG PO TABS: 500.0000 mg | ORAL_TABLET | Freq: Two times a day (BID) | ORAL | 0 refills | Status: DC

## 2019-03-09 NOTE — Addendum Note (Signed)
Addended by: Verita Schneiders A on: 03/09/2019 03:17 PM   Modules accepted: Orders

## 2019-03-09 NOTE — Progress Notes (Signed)
Patient has Trichomonal vaginitis.  Recommend testing for other STIs, also needs to let partner(s) know so the partner(s) can get testing and treatment. Patient and sex partner(s) should abstain from unprotected sexual activity for seven days after everyone receives appropriate treatment.  Metronidazole was prescribed for patient.  Patient also had yeast and bacterial vaginitis, Metronidazole will treat the bacterial vaginitis, and Diflucan was called for the yeast vaginitis. Patient will need to return in about 4 weeks after treatment for repeat test of cure.  Please call to inform patient of results and recommendations, and advise to pick up prescription and take as directed.

## 2019-03-10 ENCOUNTER — Telehealth: Payer: Self-pay | Admitting: Lactation Services

## 2019-03-10 DIAGNOSIS — B9689 Other specified bacterial agents as the cause of diseases classified elsewhere: Secondary | ICD-10-CM

## 2019-03-10 DIAGNOSIS — A5901 Trichomonal vulvovaginitis: Secondary | ICD-10-CM

## 2019-03-10 DIAGNOSIS — N760 Acute vaginitis: Secondary | ICD-10-CM

## 2019-03-10 DIAGNOSIS — B3731 Acute candidiasis of vulva and vagina: Secondary | ICD-10-CM

## 2019-03-10 DIAGNOSIS — B373 Candidiasis of vulva and vagina: Secondary | ICD-10-CM

## 2019-03-10 NOTE — Telephone Encounter (Signed)
Pt was called and informed of her lab results. She was informed to pick up her medications and take the Metronidazole for the prescribed time and follow up with the Diflucan.   Pt informed partner needs to go to [facility] Department for treatment and she and partner should refrain from sex for 7 days after both are treated.   Pt voiced understanding and reports she has no questions or concerns at this time.   Pt informed that she needs to come back in in 4 weeks for retesting and office will call her to schedule.

## 2019-03-10 NOTE — Telephone Encounter (Signed)
-----   Message from Osborne Oman, MD sent at 03/09/2019  3:17 PM EDT ----- Patient has Trichomonal vaginitis.  Recommend testing for other STIs, also needs to let partner(s) know so the partner(s) can get testing and treatment. Patient and sex partner(s) should abstain from unprotected sexual activity for seven days after everyone receives appropriate treatment.  Metronidazole was prescribed for patient.  Patient also had yeast and bacterial vaginitis, Metronidazole will treat the bacterial vaginitis, and Diflucan was called for the yeast vaginitis. Patient will need to return in about 4 weeks after treatment for repeat test of cure.  Please call to inform patient of results and recommendations, and advise to pick up prescription and take as directed.

## 2019-03-23 ENCOUNTER — Telehealth: Payer: Self-pay

## 2019-03-23 NOTE — Telephone Encounter (Signed)
Pt called requesting a call back.

## 2019-03-26 ENCOUNTER — Ambulatory Visit (INDEPENDENT_AMBULATORY_CARE_PROVIDER_SITE_OTHER): Payer: 59 | Admitting: Obstetrics and Gynecology

## 2019-03-26 ENCOUNTER — Encounter: Payer: Self-pay | Admitting: Obstetrics and Gynecology

## 2019-03-26 ENCOUNTER — Telehealth: Payer: Self-pay | Admitting: *Deleted

## 2019-03-26 ENCOUNTER — Other Ambulatory Visit: Payer: Self-pay

## 2019-03-26 DIAGNOSIS — O021 Missed abortion: Secondary | ICD-10-CM | POA: Diagnosis not present

## 2019-03-26 DIAGNOSIS — N939 Abnormal uterine and vaginal bleeding, unspecified: Secondary | ICD-10-CM | POA: Diagnosis not present

## 2019-03-26 NOTE — Progress Notes (Signed)
I connected with  Jody Robinson on 03/26/19 at  9:55 AM EDT by telephone and verified that I am speaking with the correct person using two identifiers.   I discussed the limitations, risks, security and privacy concerns of performing an evaluation and management service by telephone and virtually and the availability of in person appointments. I also discussed with the patient that there may be a patient responsible charge related to this service. The patient expressed understanding and agreed to proceed. 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   C/o intermittent bleeding since July 27. States after she lost the baby bled until week of July 16th. Then since July 27 keeps starting and stopping. Having cramping at times- like today is 6 or 7.   States she has been sexually active once since then without birth control and not sure of that date. Linda,RN 03/26/2019  9:54 AM

## 2019-03-26 NOTE — Progress Notes (Signed)
   TELEHEALTH VIRTUAL GYNECOLOGY VISIT ENCOUNTER NOTE  I connected with Jody Robinson on 03/26/19 at  9:55 AM EDT by telephone at home and verified that I am speaking with the correct person using two identifiers.   I discussed the limitations, risks, security and privacy concerns of performing an evaluation and management service by telephone and the availability of in person appointments. I also discussed with the patient that there may be a patient responsible charge related to this service. The patient expressed understanding and agreed to proceed.   History:  Jody Robinson is a 28 y.o. G28P1001 female being evaluated today for bleeding. Has had very irregular bleeding since IUFD and delivery at 56 weeks on 02/07/19. Reports she will bleed for 2-3 days, then stop, then start again the following week with no discernable pattern. Did not bring this up at her postpartum visit because she thought it would correct itself, has continued to 6 weeks post partum though. No other complaints.     Past Medical History:  Diagnosis Date  . Medical history non-contributory   . Vaginal Pap smear, abnormal    Past Surgical History:  Procedure Laterality Date  . NO PAST SURGERIES     The following portions of the patient's history were reviewed and updated as appropriate: allergies, current medications, past family history, past medical history, past social history, past surgical history and problem list.   Review of Systems:  Pertinent items noted in HPI and remainder of comprehensive ROS otherwise negative.  Physical Exam:   General:  Alert, oriented and cooperative.   Mental Status: Normal mood and affect perceived. Normal judgment and thought content.  Physical exam deferred due to nature of the encounter  Labs and Imaging No results found for this or any previous visit (from the past 336 hour(s)). No results found.    Assessment and Plan:     1. IUFD at less than 20 weeks of  gestation  2. Vaginal bleeding, abnormal Very irregular bleeding 6 weeks post delivery, will obtain TVUS and HCG to rule out retained products, to go to [facility] this weekend if she has significant bleeding, patient verbalizes understanding of plan, is agreeable - US PELVIC COMPLETE WITH TRANSVAGINAL; Future - B-HCG Quant   I discussed the assessment and treatment plan with the patient. The patient was provided an opportunity to ask questions and all were answered. The patient agreed with the plan and demonstrated an understanding of the instructions.   The patient was advised to call back or seek an in-person evaluation/go to the ED if the symptoms worsen or if the condition fails to improve as anticipated.  I provided 15 minutes of non-face-to-face time during this encounter.   Sloan Leiter, MD [facility] for [place], [place]

## 2019-03-26 NOTE — Addendum Note (Signed)
Addended by: Samuel Germany on: 03/26/2019 11:19 AM   Modules accepted: Orders

## 2019-03-26 NOTE — Telephone Encounter (Addendum)
Per Dr.Davis Ann[place]ette needs TV US and non stat bhcg early next week, and patient request am.  Per [name] none available until 04/06/19. Spoke with supervisor and given 8/19 at 3:45pm.  I called [place] twice and both times got a message voicemail cannot accept messages. Will need to call back next week and tell her about [name] and bhcg.  Renato Spellman,RN

## 2019-03-29 NOTE — Telephone Encounter (Signed)
Patient seen on 03/26/19 for virtual visit.

## 2019-03-29 NOTE — Telephone Encounter (Signed)
I called Jody Robinson back and informed her Dr. Rosana Hoes wants her to have TV US and bhcg this week and I reviewed her appointments with her. She voices understanding. I also asked how she is doing today and she states she is fine.

## 2019-03-30 ENCOUNTER — Telehealth: Payer: Self-pay | Admitting: Family Medicine

## 2019-03-30 NOTE — Telephone Encounter (Signed)
Attempted to call patient about her appointment on 8/19 @ 3:25. No answer and could not leave a voicemail because the mailbox was full.

## 2019-03-31 ENCOUNTER — Other Ambulatory Visit: Payer: Self-pay

## 2019-03-31 ENCOUNTER — Other Ambulatory Visit: Payer: 59

## 2019-03-31 ENCOUNTER — Ambulatory Visit (HOSPITAL_COMMUNITY)
Admission: RE | Admit: 2019-03-31 | Discharge: 2019-03-31 | Disposition: A | Discharge status: Home / Self Care | Payer: 59 | Source: Ambulatory Visit | Attending: Obstetrics and Gynecology | Admitting: Obstetrics and Gynecology

## 2019-03-31 DIAGNOSIS — N939 Abnormal uterine and vaginal bleeding, unspecified: Secondary | ICD-10-CM | POA: Diagnosis not present

## 2019-03-31 DIAGNOSIS — O021 Missed abortion: Secondary | ICD-10-CM

## 2019-04-01 ENCOUNTER — Other Ambulatory Visit: Payer: Self-pay

## 2019-04-01 ENCOUNTER — Ambulatory Visit (INDEPENDENT_AMBULATORY_CARE_PROVIDER_SITE_OTHER): Payer: 59 | Admitting: Psychiatry

## 2019-04-01 ENCOUNTER — Encounter (HOSPITAL_COMMUNITY): Payer: Self-pay | Admitting: Psychiatry

## 2019-04-01 ENCOUNTER — Telehealth: Payer: Self-pay | Admitting: *Deleted

## 2019-04-01 DIAGNOSIS — F4323 Adjustment disorder with mixed anxiety and depressed mood: Secondary | ICD-10-CM

## 2019-04-01 DIAGNOSIS — F324 Major depressive disorder, single episode, in partial remission: Secondary | ICD-10-CM

## 2019-04-01 LAB — BETA HCG QUANT (REF LAB): hCG Quant: 1 m[IU]/mL

## 2019-04-01 NOTE — Progress Notes (Signed)
Psychiatric Initial Adult Assessment   Patient Identification: Jody Robinson MRN:  [id number] Date of Evaluation:  04/01/2019 Referral Source: primary care Chief Complaint:   Chief Complaint    Establish Care; Depression     Visit Diagnosis:    ICD-10-CM   1. Major depressive disorder in partial remission, unspecified whether recurrent ([place])  F32.4   2. Adjustment disorder with mixed anxiety and depressed mood  F43.23   I connected with Jody Robinson on 04/01/19 at  2:00 PM EDT by a video enabled telemedicine application and verified that I am speaking with the correct person using two identifiers.   I discussed the limitations of evaluation and management by telemedicine and the availability of in person appointments. The patient expressed understanding and agreed to proceed.   History of Present Illness:   Patient is a 28 years old currently single African-American female referred by primary care physician for possible management of depression  Patient had a miscarriage in June , it was 4 month pregnancy.  Patient is a difficult to adjust and she suffers from depression after that for which reason she was referred.  Patient does have a support system she has been doing somewhat better since July.  She still feels withdrawn at times distracted and sadness sometimes her crying spells but does not endorse hopelessness or suicidal thoughts  She has a 82 years old daughter she has siblings and her mom support groups including cousins  Aggravating factor miscarriage in June  No past psychiatric history no psychotic symptoms or manic symptoms  Denies drug use denies any past trauma except the incident above  She is working full-time is able to distract herself does not endorse hopelessness.  She feels her depression has gotten better since June otherwise it was difficult for her to adjust around June time and she suffered from significant crying spells and despair  No past  psychiatric admission No past suicide attempt No prior psychiatric treatment or medication  Patient still reluctant to be on medications he is feels that she is doing better and would rather  Deal with herself and be better as she has been doing.   Past Psychiatric History: denies  Previous Psychotropic Medications: No   Substance Abuse History in the last 12 months:  No.  Consequences of Substance Abuse: NA  Past Medical History:  Past Medical History:  Diagnosis Date  . Medical history non-contributory   . Vaginal Pap smear, abnormal     Past Surgical History:  Procedure Laterality Date  . NO PAST SURGERIES      Family Psychiatric History: denies  Family History:  Family History  Problem Relation Age of Onset  . Diabetes Mother     Social History:   Social History   Socioeconomic History  . Marital status: Single    Spouse name: Not on file  . Number of children: Not on file  . Years of education: Not on file  . Highest education level: Not on file  Occupational History  . Not on file  Social Needs  . Financial resource strain: Not on file  . Food insecurity    Worry: Not on file    Inability: Not on file  . Transportation needs    Medical: Not on file    Non-medical: Not on file  Tobacco Use  . Smoking status: Never Smoker  . Smokeless tobacco: Never Used  Substance and Sexual Activity  . Alcohol use: Not Currently    Comment: occasional  .  Drug use: No  . Sexual activity: Yes    Birth control/protection: None  Lifestyle  . Physical activity    Days per week: Not on file    Minutes per session: Not on file  . Stress: Not on file  Relationships  . Social Herbalist on phone: Not on file    Gets together: Not on file    Attends religious service: Not on file    Active member of club or organization: Not on file    Attends meetings of clubs or organizations: Not on file    Relationship status: Not on file  Other Topics Concern  .  Not on file  Social History Narrative  . Not on file    Additional Social History: grew up with mom and siblings. Denies any particular trauma leading to past memories or abuse. Has one 59 year old daughter   Allergies:  No Known Allergies  Metabolic Disorder Labs: No results found for: HGBA1C, MPG No results found for: PROLACTIN No results found for: CHOL, TRIG, HDL, CHOLHDL, VLDL, LDLCALC No results found for: TSH  Therapeutic Level Labs: No results found for: LITHIUM No results found for: CBMZ No results found for: VALPROATE  Current Medications: Current Outpatient Medications  Medication Sig Dispense Refill  . Butalbital-APAP-Caffeine 50-325-40 MG capsule Take 1-2 capsules by mouth every 6 (six) hours as needed for headache. 30 capsule 3  . docusate sodium (COLACE) 100 MG capsule Take 100 mg by mouth 2 (two) times daily as needed for mild constipation.    . ferrous sulfate (FERROUSUL) 325 (65 FE) MG tablet Take 1 tablet (325 mg total) by mouth 2 (two) times daily. 60 tablet 1  . ibuprofen (ADVIL) 600 MG tablet Take 1 tablet (600 mg total) by mouth every 6 (six) hours as needed. 60 tablet 3  . metoCLOPramide (REGLAN) 10 MG tablet Take 10 mg by mouth every 8 (eight) hours as needed for nausea or vomiting.     . metroNIDAZOLE (FLAGYL) 500 MG tablet Take 1 tablet (500 mg total) by mouth 2 (two) times daily. (Patient not taking: Reported on 03/26/2019) 14 tablet 0  . ondansetron (ZOFRAN ODT) 8 MG disintegrating tablet Take 1 tablet (8 mg total) by mouth every 8 (eight) hours as needed for nausea or vomiting. 30 tablet 1  . potassium chloride SA (K-DUR) 20 MEQ tablet Take 2 tablets (40 mEq total) by mouth 2 (two) times daily. (Patient not taking: Reported on 03/26/2019) 16 tablet 0  . scopolamine (TRANSDERM-SCOP) 1 MG/3DAYS Place 1 patch (1.5 mg total) onto the skin every 3 (three) days. (Patient not taking: Reported on 03/05/2019) 10 patch 1  . zolpidem (AMBIEN) 5 MG tablet Take 1 tablet  (5 mg total) by mouth at bedtime as needed for sleep. 30 tablet 1   No current facility-administered medications for this visit.     Musculoskeletal:   Psychiatric Specialty Exam: Review of Systems  Cardiovascular: Negative for chest pain.  Skin: Negative for rash.  Neurological: Negative for tremors.    There were no vitals taken for this visit.There is no height or weight on file to calculate BMI.  General Appearance: Casual  Eye Contact:  Fair  Speech:  Slow  Volume:  Normal  Mood:  somewhat subdued  Affect:  Constricted  Thought Process:  Goal Directed  Orientation:  Full (Time, Place, and Person)  Thought Content:  Logical  Suicidal Thoughts:  No  Homicidal Thoughts:  No  Memory:  Immediate;   Fair Recent;   Fair  Judgement:  Fair  Insight:  Fair  Psychomotor Activity:  Normal  Concentration:  Concentration: Fair and Attention Span: Fair  Recall:  [employer] [place]: Fair  Akathisia:  No  Handed:  Right  AIMS (if indicated):  not done  Assets:  Desire for Improvement  ADL's:  Intact  Cognition: WNL  Sleep:  Fair   Screenings: [place] from 02/15/2019 in [place] for [facility]  Total GAD-7 Score  8    [place] from [facility]/15/2019 in [facility]  PHQ-2 Total Score  6  PHQ-9 Total Score  15      Assessment and Plan: as follows  MDD unspecified, partial remission: specifier miscarriage, doing some better, still has sadness and withdrawn behaviour, does not want to be on meds, will refer to therapy, call back early for concerns or want to be on meds  Adjustment disorder : some better, still distractions and sadness related to past. Will refer to therapy     I discussed the assessment and treatment plan with the patient. The patient was provided an opportunity to ask questions and all were answered. The patient agreed with the plan and  demonstrated an understanding of the instructions.   The patient was advised to call back or seek an in-person evaluation if the symptoms worsen or if the condition fails to improve as anticipated.  Merian Capron, MD 8/20/20202:20 PM

## 2019-04-01 NOTE — Telephone Encounter (Addendum)
-----   Message from Sloan Leiter, MD sent at 04/01/2019 10:47 AM EDT ----- Please let patient know HCG is negative, meaning she has passed all pregnancy tissue and ultrasound negative for retained products, suspect her irregular bleeding is a heavy period after her delivery. To call if it continues > 2 weeks. Thanks  8/20  1108  Pt was called and informed of lab and [name] results as stated by Dr. Rosana Hoes. Pt advised to notify us if her heavy bleeding persists more than 2 weeks. Pt voiced understanding and had no further questions.

## 2019-04-06 ENCOUNTER — Ambulatory Visit (HOSPITAL_COMMUNITY): Payer: 59

## 2019-04-07 ENCOUNTER — Other Ambulatory Visit: Payer: Self-pay

## 2019-04-07 ENCOUNTER — Ambulatory Visit (INDEPENDENT_AMBULATORY_CARE_PROVIDER_SITE_OTHER): Payer: 59 | Admitting: Lactation Services

## 2019-04-07 DIAGNOSIS — N760 Acute vaginitis: Secondary | ICD-10-CM

## 2019-04-07 DIAGNOSIS — B373 Candidiasis of vulva and vagina: Secondary | ICD-10-CM

## 2019-04-07 DIAGNOSIS — Z113 Encounter for screening for infections with a predominantly sexual mode of transmission: Secondary | ICD-10-CM

## 2019-04-07 DIAGNOSIS — N898 Other specified noninflammatory disorders of vagina: Secondary | ICD-10-CM

## 2019-04-07 DIAGNOSIS — A5901 Trichomonal vulvovaginitis: Secondary | ICD-10-CM

## 2019-04-07 DIAGNOSIS — B9689 Other specified bacterial agents as the cause of diseases classified elsewhere: Secondary | ICD-10-CM | POA: Diagnosis not present

## 2019-04-07 NOTE — Progress Notes (Signed)
Pt in the office for 4 week TOC for Trich.   Pt reports she took all but one pill for treatment as she lost it. She reports she did not take the pills for 7 straight days and that she missed a few days of taking. Pt reports she has some slight yellow vaginal discharge. Pt denies burning or itching.  Pt reports she did not have a partner at the time she was diagnosed and has since had sexual intercourse with a new partner after the waiting period.   Pt performed self swab. Pt informed that results will be back in 2-3 days and she will be notified of abnormal results. Pt voiced understanding.

## 2019-04-14 LAB — CERVICOVAGINAL ANCILLARY ONLY
Bacterial vaginitis: POSITIVE — AB
Candida vaginitis: POSITIVE — AB
Chlamydia: NEGATIVE
Neisseria Gonorrhea: NEGATIVE
Trichomonas: NEGATIVE

## 2019-04-16 ENCOUNTER — Other Ambulatory Visit: Payer: Self-pay | Admitting: Obstetrics and Gynecology

## 2019-04-16 DIAGNOSIS — B9689 Other specified bacterial agents as the cause of diseases classified elsewhere: Secondary | ICD-10-CM

## 2019-04-16 DIAGNOSIS — N760 Acute vaginitis: Secondary | ICD-10-CM

## 2019-04-16 DIAGNOSIS — A5901 Trichomonal vulvovaginitis: Secondary | ICD-10-CM

## 2019-04-16 MED ORDER — METRONIDAZOLE 500 MG PO TABS: 500.0000 mg | ORAL_TABLET | Freq: Two times a day (BID) | ORAL | 1 refills | Status: DC

## 2019-04-16 MED ORDER — FLUCONAZOLE 150 MG PO TABS: 150.0000 mg | ORAL_TABLET | Freq: Once | ORAL | 1 refills | Status: AC

## 2019-04-29 ENCOUNTER — Encounter (HOSPITAL_COMMUNITY): Payer: Self-pay | Admitting: Psychiatry

## 2019-04-29 ENCOUNTER — Other Ambulatory Visit: Payer: Self-pay

## 2019-04-29 ENCOUNTER — Ambulatory Visit (INDEPENDENT_AMBULATORY_CARE_PROVIDER_SITE_OTHER): Payer: 59 | Admitting: Psychiatry

## 2019-04-29 DIAGNOSIS — F4323 Adjustment disorder with mixed anxiety and depressed mood: Secondary | ICD-10-CM

## 2019-04-29 DIAGNOSIS — F324 Major depressive disorder, single episode, in partial remission: Secondary | ICD-10-CM | POA: Diagnosis not present

## 2019-04-29 MED ORDER — ESCITALOPRAM OXALATE 10 MG PO TABS: 10.0000 mg | ORAL_TABLET | Freq: Every day | ORAL | 0 refills | Status: DC

## 2019-04-29 NOTE — Progress Notes (Signed)
[facility] Follow up visit  Patient Identification: Jody Robinson MRN:  [id number] Date of Evaluation:  04/29/2019 Referral Source: primary care Chief Complaint:   depression follow up  Visit Diagnosis:    ICD-10-CM   1. Major depressive disorder in partial remission, unspecified whether recurrent ([place])  F32.4   2. Adjustment disorder with mixed anxiety and depressed mood  F43.23   I connected with Jody Robinson on 04/29/19 at  2:30 PM EDT by a video enabled telemedicine application and verified that I am speaking with the correct person using two identifiers.      I discussed the limitations of evaluation and management by telemedicine and the availability of in person appointments. The patient expressed understanding and agreed to proceed.   History of Present Illness:   Patient is a 28 years old currently single African-American female initially  referred by primary care physician for possible management of depression  Patient had a miscarriage in June , it was 4 month pregnancy. She had difficult time to adjust was having crying spells  She has a 72 years old daughter she has siblings and her mom support groups including cousins She did not wanted to be on med last visit was feeling some depressed Now wants to consider med as feeling subdued, some days withdrawn and tired. At times crying spells Not hopeless or suicidal Aggravating factor miscarriage in June  No past psychiatric history  She continues to work No past psychiatric admission No past suicide attempt    Past Psychiatric History: denies  Previous Psychotropic Medications: No     Past Medical History:  Past Medical History:  Diagnosis Date  . Medical history non-contributory   . Vaginal Pap smear, abnormal     Past Surgical History:  Procedure Laterality Date  . NO PAST SURGERIES      Family Psychiatric History: denies  Family History:  Family History  Problem Relation Age of Onset  . Diabetes  Mother     Social History:   Social History   Socioeconomic History  . Marital status: Single    Spouse name: Not on file  . Number of children: Not on file  . Years of education: Not on file  . Highest education level: Not on file  Occupational History  . Not on file  Social Needs  . Financial resource strain: Not on file  . Food insecurity    Worry: Not on file    Inability: Not on file  . Transportation needs    Medical: Not on file    Non-medical: Not on file  Tobacco Use  . Smoking status: Never Smoker  . Smokeless tobacco: Never Used  Substance and Sexual Activity  . Alcohol use: Not Currently    Comment: occasional  . Drug use: No  . Sexual activity: Yes    Birth control/protection: None  Lifestyle  . Physical activity    Days per week: Not on file    Minutes per session: Not on file  . Stress: Not on file  Relationships  . Social Herbalist on phone: Not on file    Gets together: Not on file    Attends religious service: Not on file    Active member of club or organization: Not on file    Attends meetings of clubs or organizations: Not on file    Relationship status: Not on file  Other Topics Concern  . Not on file  Social History Narrative  . Not on file  Allergies:  No Known Allergies  Metabolic Disorder Labs: No results found for: HGBA1C, MPG No results found for: PROLACTIN No results found for: CHOL, TRIG, HDL, CHOLHDL, VLDL, LDLCALC No results found for: TSH  Therapeutic Level Labs: No results found for: LITHIUM No results found for: CBMZ No results found for: VALPROATE  Current Medications: Current Outpatient Medications  Medication Sig Dispense Refill  . Butalbital-APAP-Caffeine 50-325-40 MG capsule Take 1-2 capsules by mouth every 6 (six) hours as needed for headache. 30 capsule 3  . docusate sodium (COLACE) 100 MG capsule Take 100 mg by mouth 2 (two) times daily as needed for mild constipation.    Marland Kitchen escitalopram  (LEXAPRO) 10 MG tablet Take 1 tablet (10 mg total) by mouth daily. 30 tablet 0  . ferrous sulfate (FERROUSUL) 325 (65 FE) MG tablet Take 1 tablet (325 mg total) by mouth 2 (two) times daily. 60 tablet 1  . ibuprofen (ADVIL) 600 MG tablet Take 1 tablet (600 mg total) by mouth every 6 (six) hours as needed. 60 tablet 3  . metoCLOPramide (REGLAN) 10 MG tablet Take 10 mg by mouth every 8 (eight) hours as needed for nausea or vomiting.     . metroNIDAZOLE (FLAGYL) 500 MG tablet Take 1 tablet (500 mg total) by mouth 2 (two) times daily. 14 tablet 1  . ondansetron (ZOFRAN ODT) 8 MG disintegrating tablet Take 1 tablet (8 mg total) by mouth every 8 (eight) hours as needed for nausea or vomiting. 30 tablet 1  . potassium chloride SA (K-DUR) 20 MEQ tablet Take 2 tablets (40 mEq total) by mouth 2 (two) times daily. (Patient not taking: Reported on 03/26/2019) 16 tablet 0  . scopolamine (TRANSDERM-SCOP) 1 MG/3DAYS Place 1 patch (1.5 mg total) onto the skin every 3 (three) days. (Patient not taking: Reported on 03/05/2019) 10 patch 1  . zolpidem (AMBIEN) 5 MG tablet Take 1 tablet (5 mg total) by mouth at bedtime as needed for sleep. 30 tablet 1   No current facility-administered medications for this visit.     Musculoskeletal:   Psychiatric Specialty Exam: Review of Systems  Cardiovascular: Negative for chest pain.  Skin: Negative for rash.    There were no vitals taken for this visit.There is no height or weight on file to calculate BMI.  General Appearance: Casual  Eye Contact:  Fair  Speech:  Slow  Volume:  Normal  Mood: subdued  Affect:  Constricted  Thought Process:  Goal Directed  Orientation:  Full (Time, Place, and Person)  Thought Content:  Logical  Suicidal Thoughts:  No  Homicidal Thoughts:  No  Memory:  Immediate;   Fair Recent;   Fair  Judgement:  Fair  Insight:  Fair  Psychomotor Activity:  Normal  Concentration:  Concentration: Fair and Attention Span: Fair  Recall:  [employer]  of [place]: Fair  Akathisia:  No  Handed:  Right  AIMS (if indicated):  not done  Assets:  Desire for Improvement  ADL's:  Intact  Cognition: WNL  Sleep:  Fair   Screenings: [place] from 02/15/2019 in [place] for [facility]  Total GAD-7 Score  8    [place] [facility] [place] [facility]  PHQ-2 Total Score  6  PHQ-9 Total Score  15      Assessment and Plan: as follows  MDD unspecified, partial remission: specifier miscarriage, somewhat subdued, wants to  try med. Will start lexapro increase to 10mg  in 4 days Not pregnant Discussed side effects Adjustment disorder : some better, still distractions and sadness related to past. Start lexapro   I discussed the assessment and treatment plan with the patient. The patient was provided an opportunity to ask questions and all were answered. The patient agreed with the plan and demonstrated an understanding of the instructions.   The patient was advised to call back or seek an in-person evaluation if the symptoms worsen or if the condition fails to improve as anticipated. Fu 3-4 w or earlier  Merian Capron, MD 9/17/20202:37 PM

## 2019-05-28 ENCOUNTER — Encounter (HOSPITAL_BASED_OUTPATIENT_CLINIC_OR_DEPARTMENT_OTHER): Payer: Self-pay | Admitting: *Deleted

## 2019-05-28 ENCOUNTER — Other Ambulatory Visit: Payer: Self-pay

## 2019-05-28 ENCOUNTER — Emergency Department (HOSPITAL_BASED_OUTPATIENT_CLINIC_OR_DEPARTMENT_OTHER)
Admission: EM | Admit: 2019-05-28 | Discharge: 2019-05-28 | Disposition: A | Discharge status: Home / Self Care | Payer: 59 | Attending: Emergency Medicine | Admitting: Emergency Medicine

## 2019-05-28 DIAGNOSIS — Z79899 Other long term (current) drug therapy: Secondary | ICD-10-CM | POA: Diagnosis not present

## 2019-05-28 DIAGNOSIS — G44209 Tension-type headache, unspecified, not intractable: Secondary | ICD-10-CM | POA: Insufficient documentation

## 2019-05-28 DIAGNOSIS — R519 Headache, unspecified: Secondary | ICD-10-CM | POA: Diagnosis present

## 2019-05-28 MED ORDER — KETOROLAC TROMETHAMINE 60 MG/2ML IM SOLN
60.0000 mg | Freq: Once | INTRAMUSCULAR | Status: AC
  Administered 2019-05-28: 60 mg via INTRAMUSCULAR
  Filled 2019-05-28: qty 2

## 2019-05-28 NOTE — ED Notes (Signed)
Per Pt. Headache started yesterday.. Pt. Speaking with EDP No visual disturbances. Pt. Reports nausea.  No fevers.  Pt. Reporting the headache is in the front. Reports she has headaches  Often.   [place] status is normal.     Pt. Said she drove herself here and has no ride home today.

## 2019-05-28 NOTE — ED Provider Notes (Signed)
[facility] Provider Note   CSN: [id number] Arrival date & time: 05/28/19  1715     History   Chief Complaint Chief Complaint  Patient presents with  . Headache    HPI Stefania Senff is a 28 y.o. female.     Patient is a 28 year old female who presents with a headache.  She reports a 2-day history of worsening headache across her bifrontal area.  She states it started out mild yesterday and got worse today.  She has associated nausea but no vomiting.  No dizziness.  No ataxia.  No numbness or weakness to her extremities.  She states she has similar headaches frequently, usually about 1 every other week.  She has taken some over-the-counter medications without improvement in symptoms.  She denies any fevers.  No recent head injury.  No neck pain or stiffness.     Past Medical History:  Diagnosis Date  . Medical history non-contributory   . Vaginal Pap smear, abnormal     Patient Active Problem List   Diagnosis Date Noted  . Trichomonal vaginitis 03/09/2019  . Yeast vaginitis 03/09/2019  . BV (bacterial vaginosis) 03/09/2019  . Hyponatremia 02/07/2019  . Hypokalemia 02/07/2019  . Abnormal LFTs 02/07/2019  . Acute kidney failure ([place]) 02/07/2019  . UTI (urinary tract infection) 02/07/2019  . IUFD at less than 20 weeks of gestation 02/06/2019  . Morning sickness 12/14/2018    Past Surgical History:  Procedure Laterality Date  . NO PAST SURGERIES       OB History    Gravida  2   Para  1   Term  1   Preterm      AB      Living  1     SAB      TAB      Ectopic      Multiple      Live Births               Home Medications    Prior to Admission medications   Medication Sig Start Date End Date Taking? Authorizing Provider  escitalopram (LEXAPRO) 10 MG tablet Take 1 tablet (10 mg total) by mouth daily. 04/29/19  Yes Merian Capron, MD  Butalbital-APAP-Caffeine [phone] MG capsule Take 1-2 capsules by mouth  every 6 (six) hours as needed for headache. 03/05/19   Anyanwu, Sallyanne Havers, MD  docusate sodium (COLACE) 100 MG capsule Take 100 mg by mouth 2 (two) times daily as needed for mild constipation.    [provider]  ferrous sulfate (FERROUSUL) 325 (65 FE) MG tablet Take 1 tablet (325 mg total) by mouth 2 (two) times daily. 02/08/19   Constant, Peggy, MD  ibuprofen (ADVIL) 600 MG tablet Take 1 tablet (600 mg total) by mouth every 6 (six) hours as needed. 02/08/19   Constant, Peggy, MD  metoCLOPramide (REGLAN) 10 MG tablet Take 10 mg by mouth every 8 (eight) hours as needed for nausea or vomiting.  01/17/19   [provider]  metroNIDAZOLE (FLAGYL) 500 MG tablet Take 1 tablet (500 mg total) by mouth 2 (two) times daily. 04/16/19   Aletha Halim, MD  ondansetron (ZOFRAN ODT) 8 MG disintegrating tablet Take 1 tablet (8 mg total) by mouth every 8 (eight) hours as needed for nausea or vomiting. 01/25/19   Julianne Handler, CNM  potassium chloride SA (K-DUR) 20 MEQ tablet Take 2 tablets (40 mEq total) by mouth 2 (two) times daily. Patient not taking: Reported on 03/26/2019  02/08/19   Constant, Peggy, MD  scopolamine (TRANSDERM-SCOP) 1 MG/3DAYS Place 1 patch (1.5 mg total) onto the skin every 3 (three) days. Patient not taking: Reported on 03/05/2019 01/28/19   Julianne Handler, CNM  zolpidem (AMBIEN) 5 MG tablet Take 1 tablet (5 mg total) by mouth at bedtime as needed for sleep. 02/16/19   Osborne Oman, MD    Family History Family History  Problem Relation Age of Onset  . Diabetes Mother     Social History Social History   Tobacco Use  . Smoking status: Never Smoker  . Smokeless tobacco: Never Used  Substance Use Topics  . Alcohol use: Not Currently    Comment: occasional  . Drug use: No     Allergies   Patient has no known allergies.   Review of Systems Review of Systems  Constitutional: Negative for chills, diaphoresis, fatigue and fever.  HENT: Negative for congestion,  rhinorrhea and sneezing.   Eyes: Negative.   Respiratory: Negative for cough, chest tightness and shortness of breath.   Cardiovascular: Negative for chest pain and leg swelling.  Gastrointestinal: Positive for nausea. Negative for abdominal pain, blood in stool, diarrhea and vomiting.  Genitourinary: Negative for difficulty urinating, flank pain, frequency and hematuria.  Musculoskeletal: Negative for arthralgias and back pain.  Skin: Negative for rash.  Neurological: Positive for headaches. Negative for dizziness, speech difficulty, weakness and numbness.     Physical Exam Updated Vital Signs BP 123/88   Pulse 74   Temp 98.2 F (36.8 C) (Oral)   Resp 18   Ht 5' (1.524 m)   Wt 63.5 kg   LMP 05/09/2019   SpO2 99%   BMI 27.34 kg/m   Physical Exam Constitutional:      Appearance: She is well-developed.  HENT:     Head: Normocephalic and atraumatic.  Eyes:     Pupils: Pupils are equal, round, and reactive to light.  Neck:     Musculoskeletal: Normal range of motion and neck supple.  Cardiovascular:     Rate and Rhythm: Normal rate and regular rhythm.     Heart sounds: Normal heart sounds.  Pulmonary:     Effort: Pulmonary effort is normal. No respiratory distress.     Breath sounds: Normal breath sounds. No wheezing or rales.  Chest:     Chest wall: No tenderness.  Abdominal:     General: Bowel sounds are normal.     Palpations: Abdomen is soft.     Tenderness: There is no abdominal tenderness. There is no guarding or rebound.  Musculoskeletal: Normal range of motion.  Lymphadenopathy:     Cervical: No cervical adenopathy.  Skin:    General: Skin is warm and dry.     Findings: No rash.  Neurological:     Mental Status: She is alert and oriented to person, place, and time.     Comments: Motor 5/5 all extremities Sensation grossly intact to LT all extremities Finger to Nose intact, no pronator drift CN II-XII grossly intact        ED Treatments / Results   Labs (all labs ordered are listed, but only abnormal results are displayed) Labs Reviewed - No data to display  EKG None  Radiology No results found.  Procedures Procedures (including critical care time)  Medications Ordered in ED Medications  ketorolac (TORADOL) injection 60 mg (60 mg Intramuscular Given 05/28/19 1755)     Initial Impression / Assessment and Plan / ED Course  I have reviewed  the triage vital signs and the nursing notes.  Pertinent labs & imaging results that were available during my care of the patient were reviewed by me and considered in my medical decision making (see chart for details).        Patient presents with a bifrontal type headache.  She is neurologically intact.  She has no symptoms that be more concerning for subarachnoid hemorrhage or meningitis.  No fevers or other red flags.  Her headaches are similar to headaches that she has chronically.  She has not been evaluated for them in the past.  However they are not persistent and I do not feel that imaging is indicated today.  She was given a shot of Toradol and feels much better and is ready to go home.  She was discharged home in good condition.  She was encouraged to follow-up with a PCP for chronic headache management.  Return precautions were given.  Final Clinical Impressions(s) / ED Diagnoses   Final diagnoses:  Acute non intractable tension-type headache    ED Discharge Orders    None       Malvin Johns, MD 05/28/19 1826

## 2019-05-28 NOTE — ED Triage Notes (Signed)
Headache since yesterday. Hx of headaches.

## 2019-05-28 NOTE — ED Notes (Signed)
ED Provider at bedside. 

## 2019-05-31 ENCOUNTER — Ambulatory Visit (HOSPITAL_COMMUNITY): Payer: 59 | Admitting: Psychiatry

## 2019-06-16 ENCOUNTER — Other Ambulatory Visit (HOSPITAL_COMMUNITY): Payer: Self-pay

## 2019-06-16 MED ORDER — ESCITALOPRAM OXALATE 10 MG PO TABS: 10.0000 mg | ORAL_TABLET | Freq: Every day | ORAL | 0 refills | Status: DC

## 2019-06-18 ENCOUNTER — Ambulatory Visit: Payer: Self-pay | Admitting: Obstetrics and Gynecology

## 2019-06-23 ENCOUNTER — Ambulatory Visit: Payer: Self-pay | Admitting: Obstetrics and Gynecology

## 2019-06-24 ENCOUNTER — Emergency Department (HOSPITAL_BASED_OUTPATIENT_CLINIC_OR_DEPARTMENT_OTHER)
Admission: EM | Admit: 2019-06-24 | Discharge: 2019-06-24 | Disposition: A | Discharge status: Home / Self Care | Payer: 59 | Attending: Emergency Medicine | Admitting: Emergency Medicine

## 2019-06-24 ENCOUNTER — Encounter (HOSPITAL_BASED_OUTPATIENT_CLINIC_OR_DEPARTMENT_OTHER): Payer: Self-pay | Admitting: *Deleted

## 2019-06-24 ENCOUNTER — Other Ambulatory Visit: Payer: Self-pay

## 2019-06-24 DIAGNOSIS — R519 Headache, unspecified: Secondary | ICD-10-CM | POA: Insufficient documentation

## 2019-06-24 LAB — PREGNANCY, URINE: Preg Test, Ur: NEGATIVE

## 2019-06-24 MED ORDER — ACETAMINOPHEN 500 MG PO TABS
1000.0000 mg | ORAL_TABLET | Freq: Once | ORAL | Status: AC
  Administered 2019-06-24: 1000 mg via ORAL
  Filled 2019-06-24: qty 2

## 2019-06-24 NOTE — Discharge Instructions (Addendum)
Thank you for allowing me to care for you today.  Your pregnancy test was negative.  Please return to the emergency department if you have new or worsening symptoms.  You can take over-the-counter Tylenol or Motrin for headache.

## 2019-06-24 NOTE — ED Triage Notes (Addendum)
Headache x 2 days. States she would like to have a pregnancy test while she is here.

## 2019-06-24 NOTE — ED Provider Notes (Signed)
[facility] EMERGENCY DEPARTMENT Provider Note   CSN: [id number] Arrival date & time: 06/24/19  2047     History   Chief Complaint Chief Complaint  Patient presents with  . Headache    HPI Jody Robinson is a 28 y.o. female.     28 y/o female presenting to the ER for "headache and pregnancy test". Patient reports frontal headache x2 days. Has had hx of the same in the past, improved with "sleep" and OTC medications. She has not tried anything for relief. This is not the worse headache of her life. She denies any neurological sx. She also reports wanting a pregnancy test. Last menstrual cycle was 10/21 and was normal. Denies abdominal pain, flank pain, dysuria, hematuria, vaginal discahrge or bleeding     Past Medical History:  Diagnosis Date  . Medical history non-contributory   . Vaginal Pap smear, abnormal     Patient Active Problem List   Diagnosis Date Noted  . Trichomonal vaginitis 03/09/2019  . Yeast vaginitis 03/09/2019  . BV (bacterial vaginosis) 03/09/2019  . Hyponatremia 02/07/2019  . Hypokalemia 02/07/2019  . Abnormal LFTs 02/07/2019  . Acute kidney failure ([place]) 02/07/2019  . UTI (urinary tract infection) 02/07/2019  . IUFD at less than 20 weeks of gestation 02/06/2019  . Morning sickness 12/14/2018    Past Surgical History:  Procedure Laterality Date  . NO PAST SURGERIES       OB History    Gravida  2   Para  1   Term  1   Preterm      AB      Living  1     SAB      TAB      Ectopic      Multiple      Live Births               Home Medications    Prior to Admission medications   Medication Sig Start Date End Date Taking? Authorizing Provider  Butalbital-APAP-Caffeine 50-325-40 MG capsule Take 1-2 capsules by mouth every 6 (six) hours as needed for headache. 03/05/19   Anyanwu, Sallyanne Havers, MD  docusate sodium (COLACE) 100 MG capsule Take 100 mg by mouth 2 (two) times daily as needed for mild constipation.     [provider]  escitalopram (LEXAPRO) 10 MG tablet Take 1 tablet (10 mg total) by mouth daily. 06/16/19   Merian Capron, MD  ferrous sulfate (FERROUSUL) 325 (65 FE) MG tablet Take 1 tablet (325 mg total) by mouth 2 (two) times daily. 02/08/19   Constant, Peggy, MD  ibuprofen (ADVIL) 600 MG tablet Take 1 tablet (600 mg total) by mouth every 6 (six) hours as needed. 02/08/19   Constant, Peggy, MD  metoCLOPramide (REGLAN) 10 MG tablet Take 10 mg by mouth every 8 (eight) hours as needed for nausea or vomiting.  01/17/19   [provider]  metroNIDAZOLE (FLAGYL) 500 MG tablet Take 1 tablet (500 mg total) by mouth 2 (two) times daily. 04/16/19   Aletha Halim, MD  ondansetron (ZOFRAN ODT) 8 MG disintegrating tablet Take 1 tablet (8 mg total) by mouth every 8 (eight) hours as needed for nausea or vomiting. 01/25/19   Julianne Handler, CNM  potassium chloride SA (K-DUR) 20 MEQ tablet Take 2 tablets (40 mEq total) by mouth 2 (two) times daily. Patient not taking: Reported on 03/26/2019 02/08/19   Constant, Peggy, MD  scopolamine (TRANSDERM-SCOP) 1 MG/3DAYS Place 1 patch (1.5 mg total) onto  the skin every 3 (three) days. Patient not taking: Reported on 03/05/2019 01/28/19   Julianne Handler, CNM  zolpidem (AMBIEN) 5 MG tablet Take 1 tablet (5 mg total) by mouth at bedtime as needed for sleep. 02/16/19   Osborne Oman, MD    Family History Family History  Problem Relation Age of Onset  . Diabetes Mother     Social History Social History   Tobacco Use  . Smoking status: Never Smoker  . Smokeless tobacco: Never Used  Substance Use Topics  . Alcohol use: Not Currently    Comment: occasional  . Drug use: No     Allergies   Patient has no known allergies.   Review of Systems Review of Systems  Constitutional: Negative.   HENT: Negative.   Respiratory: Negative for cough and wheezing.   Gastrointestinal: Negative for abdominal pain, anal bleeding, diarrhea, nausea and vomiting.   Genitourinary: Negative for decreased urine volume, dysuria, flank pain, frequency, menstrual problem, pelvic pain, vaginal bleeding, vaginal discharge and vaginal pain.  Musculoskeletal: Negative for arthralgias, back pain, myalgias, neck pain and neck stiffness.  Skin: Negative for rash and wound.  Neurological: Positive for headaches. Negative for dizziness, seizures, syncope, speech difficulty, weakness, light-headedness and numbness.  Psychiatric/Behavioral: Negative for confusion.     Physical Exam Updated Vital Signs BP 125/86   Pulse 80   Temp 98.5 F (36.9 C) (Oral)   Resp 14   Ht 5' (1.524 m)   Wt 63.5 kg   LMP 06/02/2019   SpO2 100%   BMI 27.34 kg/m   Physical Exam Vitals signs and nursing note reviewed.  Constitutional:      General: She is not in acute distress.    Appearance: Normal appearance. She is well-developed. She is obese. She is not ill-appearing, toxic-appearing or diaphoretic.  HENT:     Head: Normocephalic.     Mouth/Throat:     Mouth: Mucous membranes are moist.  Eyes:     Conjunctiva/sclera: Conjunctivae normal.  Cardiovascular:     Rate and Rhythm: Normal rate and regular rhythm.  Pulmonary:     Effort: Pulmonary effort is normal.  Skin:    General: Skin is dry.  Neurological:     Mental Status: She is alert.     Cranial Nerves: No cranial nerve deficit or facial asymmetry.     Sensory: No sensory deficit.     Motor: No weakness.     Coordination: Romberg sign negative. Coordination normal.     Gait: Gait normal.  Psychiatric:        Mood and Affect: Mood normal.        Speech: Speech normal.        Behavior: Behavior normal.      ED Treatments / Results  Labs (all labs ordered are listed, but only abnormal results are displayed) Labs Reviewed  PREGNANCY, URINE    EKG None  Radiology No results found.  Procedures Procedures (including critical care time)  Medications Ordered in ED Medications  acetaminophen  (TYLENOL) tablet 1,000 mg (1,000 mg Oral Given 06/24/19 2126)     Initial Impression / Assessment and Plan / ED Course  I have reviewed the triage vital signs and the nursing notes.  Pertinent labs & imaging results that were available during my care of the patient were reviewed by me and considered in my medical decision making (see chart for details).  Clinical Course as of Jun 23 2200  Thu Jun 24, 2019  2159  Patient presented to the emergency department in need of a urine pregnancy test.  Also reports that she has a headache which is not new for her.  Patient was given Tylenol and her headache resolved.  Patient's urine pregnancy test was negative.  She is not having any additional symptoms including no abdominal pain, vaginal complaints, fever, chills, neurological symptoms.  Patient's exam was normal.  All questions were answered for the patient and she was advised on strict return precautions.  Discharged in stable position.   [KM]    Clinical Course User Index [KM] Alveria Apley, PA-C       Based on review of vitals, medical screening exam, lab work and/or imaging, there does not appear to be an acute, emergent etiology for the patient's symptoms. Counseled pt on good return precautions and encouraged both PCP and ED follow-up as needed.  Prior to discharge, I also discussed incidental imaging findings with patient in detail and advised appropriate, recommended follow-up in detail.  Clinical Impression: 1. Nonintractable episodic headache, unspecified headache type     Disposition: Discharge  Prior to providing a prescription for a controlled substance, I independently reviewed the patient's recent prescription history on the [facility]. The patient had no recent or regular prescriptions and was deemed appropriate for a brief, less than 3 day prescription of narcotic for acute analgesia.  This note was prepared with assistance of  Systems analyst. Occasional wrong-word or sound-a-like substitutions may have occurred due to the inherent limitations of voice recognition software.   Final Clinical Impressions(s) / ED Diagnoses   Final diagnoses:  Nonintractable episodic headache, unspecified headache type    ED Discharge Orders    None       Kristine Royal 06/24/19 2201    Lennice Sites, DO 06/24/19 2221

## 2019-06-25 ENCOUNTER — Telehealth: Payer: Self-pay | Admitting: *Deleted

## 2019-06-25 NOTE — Telephone Encounter (Signed)
Jody Robinson left a voice message this am that she has a question about pregnancy test. States she took home pregnancy tests that were positive and went to Urgent care yesterday and urine pregnancy test negative. Wants to know if is normal. I reviewed her chart and verified negative test. I called her and we discussed that urine pregnancy test done in Urgent care is very sensitive to detecting pregnancy hormone but if very early ,may not detect it. I asked if she had missed her period. She states she has not yet missed a period- is due tomorrow. I explained for both home urine pregnancy and hospital/ urgent care first urine is am is best urine to detect pregnancy because it is most concentrated. I advised her to wait until she missed her period and then can either come to drop in clinic on Mondays 5pm-7p or call and make appointment for nurse visit for upt. I also informed her if our test is negative and she has missed her period we can do blood test. She voices understanding. Linda,RN

## 2019-06-26 ENCOUNTER — Other Ambulatory Visit: Payer: Self-pay

## 2019-06-26 ENCOUNTER — Encounter (HOSPITAL_COMMUNITY): Payer: Self-pay | Admitting: Obstetrics and Gynecology

## 2019-06-26 ENCOUNTER — Inpatient Hospital Stay (HOSPITAL_COMMUNITY)
Admission: AD | Admit: 2019-06-26 | Discharge: 2019-06-26 | Disposition: A | Discharge status: Home / Self Care | Payer: 59 | Attending: Obstetrics and Gynecology | Admitting: Obstetrics and Gynecology

## 2019-06-26 DIAGNOSIS — O210 Mild hyperemesis gravidarum: Secondary | ICD-10-CM | POA: Diagnosis not present

## 2019-06-26 DIAGNOSIS — Z3A01 Less than 8 weeks gestation of pregnancy: Secondary | ICD-10-CM | POA: Diagnosis not present

## 2019-06-26 DIAGNOSIS — O219 Vomiting of pregnancy, unspecified: Secondary | ICD-10-CM | POA: Diagnosis not present

## 2019-06-26 DIAGNOSIS — Z3A00 Weeks of gestation of pregnancy not specified: Secondary | ICD-10-CM | POA: Diagnosis not present

## 2019-06-26 DIAGNOSIS — Z3201 Encounter for pregnancy test, result positive: Secondary | ICD-10-CM | POA: Diagnosis not present

## 2019-06-26 DIAGNOSIS — Z79899 Other long term (current) drug therapy: Secondary | ICD-10-CM | POA: Insufficient documentation

## 2019-06-26 LAB — POCT PREGNANCY, URINE: Preg Test, Ur: POSITIVE — AB

## 2019-06-26 MED ORDER — DOXYLAMINE-PYRIDOXINE 10-10 MG PO TBEC: 2.0000 | DELAYED_RELEASE_TABLET | Freq: Every day | ORAL | 1 refills | Status: DC

## 2019-06-26 NOTE — MAU Note (Addendum)
Pt here with complaints of nausea and wants pregnancy test. Nausea started today, but no vomiting. Reports she had more than 1 positive upt at home. Had a test at urgent care that was negative. LMP: 06/02/19. Pt denies vaginal bleeding or pain.

## 2019-06-26 NOTE — MAU Provider Note (Signed)
History     CSN: [id number]  Arrival date and time: 06/26/19 2002   First Provider Initiated Contact with Patient 06/26/19 2101      Chief Complaint  Patient presents with  . Possible Pregnancy  . Nausea   HPI   [name] is a 28 y.o. unknown gestation female with nausea and concerns about pregnancy. She had a positive home test today, however had a negative test in the urgent care 2 days ago. LMP Oct 21. No vomiting, only complaints of nausea. Does not have nausea medication.  No abdominal pain or vaginal bleeding.   OB History    Gravida  3   Para  1   Term  1   Preterm      AB  1   Living  1     SAB  1   TAB      Ectopic      Multiple      Live Births  1           Past Medical History:  Diagnosis Date  . Medical history non-contributory   . Vaginal Pap smear, abnormal     Past Surgical History:  Procedure Laterality Date  . NO PAST SURGERIES      Family History  Problem Relation Age of Onset  . Diabetes Mother     Social History   Tobacco Use  . Smoking status: Never Smoker  . Smokeless tobacco: Never Used  Substance Use Topics  . Alcohol use: Not Currently    Comment: occasional  . Drug use: No    Allergies: No Known Allergies  Medications Prior to Admission  Medication Sig Dispense Refill Last Dose  . Butalbital-APAP-Caffeine 50-325-40 MG capsule Take 1-2 capsules by mouth every 6 (six) hours as needed for headache. 30 capsule 3   . docusate sodium (COLACE) 100 MG capsule Take 100 mg by mouth 2 (two) times daily as needed for mild constipation.     Marland Kitchen escitalopram (LEXAPRO) 10 MG tablet Take 1 tablet (10 mg total) by mouth daily. 30 tablet 0   . ferrous sulfate (FERROUSUL) 325 (65 FE) MG tablet Take 1 tablet (325 mg total) by mouth 2 (two) times daily. 60 tablet 1   . ibuprofen (ADVIL) 600 MG tablet Take 1 tablet (600 mg total) by mouth every 6 (six) hours as needed. 60 tablet 3   . metoCLOPramide (REGLAN) 10 MG  tablet Take 10 mg by mouth every 8 (eight) hours as needed for nausea or vomiting.      . metroNIDAZOLE (FLAGYL) 500 MG tablet Take 1 tablet (500 mg total) by mouth 2 (two) times daily. 14 tablet 1   . ondansetron (ZOFRAN ODT) 8 MG disintegrating tablet Take 1 tablet (8 mg total) by mouth every 8 (eight) hours as needed for nausea or vomiting. 30 tablet 1   . potassium chloride SA (K-DUR) 20 MEQ tablet Take 2 tablets (40 mEq total) by mouth 2 (two) times daily. (Patient not taking: Reported on 03/26/2019) 16 tablet 0   . scopolamine (TRANSDERM-SCOP) 1 MG/3DAYS Place 1 patch (1.5 mg total) onto the skin every 3 (three) days. (Patient not taking: Reported on 03/05/2019) 10 patch 1   . zolpidem (AMBIEN) 5 MG tablet Take 1 tablet (5 mg total) by mouth at bedtime as needed for sleep. 30 tablet 1    Results for orders placed or performed during the hospital encounter of 06/26/19 (from the past 48 hour(s))  Pregnancy, urine POC  Status: Abnormal   Collection Time: 06/26/19  8:41 PM  Result Value Ref Range   Preg Test, Ur POSITIVE (A) NEGATIVE    Comment:        THE SENSITIVITY OF THIS METHODOLOGY IS >24 mIU/mL    Review of Systems  Gastrointestinal: Positive for nausea. Negative for abdominal pain and vomiting.  Genitourinary: Negative for vaginal bleeding and vaginal discharge.   Physical Exam   Blood pressure 132/89, pulse 88, temperature 98.1 F (36.7 C), temperature source Oral, resp. rate 18, height 5' (1.524 m), weight 71.1 kg, last menstrual period 06/02/2019, SpO2 100 %.  Physical Exam  Constitutional: She is oriented to person, place, and time. She appears well-developed and well-nourished.  Musculoskeletal: Normal range of motion.  Neurological: She is alert and oriented to person, place, and time.  Skin: Skin is warm.  Psychiatric: Her behavior is normal.   MAU Course  Procedures  None  MDM  Urine pregnancy test positive.   Assessment and Plan   A:  1. Morning  sickness   2. Nausea and vomiting in pregnancy   3. Encounter for pregnancy test, result positive     P:  Discharge home in stable condition Rx: Diclegis Prenatal vitamins daily Start prenatal care First trimester warning signs.  Lezlie Lye, NP 06/26/2019 9:32 PM

## 2019-07-10 ENCOUNTER — Encounter (HOSPITAL_COMMUNITY): Payer: Self-pay | Admitting: Student

## 2019-07-10 ENCOUNTER — Inpatient Hospital Stay (HOSPITAL_COMMUNITY)
Admission: AD | Admit: 2019-07-10 | Discharge: 2019-07-10 | Disposition: A | Discharge status: Home / Self Care | Payer: 59 | Attending: Obstetrics and Gynecology | Admitting: Obstetrics and Gynecology

## 2019-07-10 ENCOUNTER — Other Ambulatory Visit: Payer: Self-pay

## 2019-07-10 DIAGNOSIS — Z79899 Other long term (current) drug therapy: Secondary | ICD-10-CM | POA: Insufficient documentation

## 2019-07-10 DIAGNOSIS — Z3A01 Less than 8 weeks gestation of pregnancy: Secondary | ICD-10-CM | POA: Diagnosis not present

## 2019-07-10 DIAGNOSIS — Z833 Family history of diabetes mellitus: Secondary | ICD-10-CM | POA: Insufficient documentation

## 2019-07-10 DIAGNOSIS — R519 Headache, unspecified: Secondary | ICD-10-CM | POA: Insufficient documentation

## 2019-07-10 DIAGNOSIS — O99891 Other specified diseases and conditions complicating pregnancy: Secondary | ICD-10-CM | POA: Insufficient documentation

## 2019-07-10 DIAGNOSIS — O26891 Other specified pregnancy related conditions, first trimester: Secondary | ICD-10-CM

## 2019-07-10 LAB — URINALYSIS, ROUTINE W REFLEX MICROSCOPIC
Bilirubin Urine: NEGATIVE
Glucose, UA: NEGATIVE mg/dL
Hgb urine dipstick: NEGATIVE
Ketones, ur: NEGATIVE mg/dL
Leukocytes,Ua: NEGATIVE
Nitrite: NEGATIVE
Protein, ur: NEGATIVE mg/dL
Specific Gravity, Urine: 1.006 (ref 1.005–1.030)
pH: 8 (ref 5.0–8.0)

## 2019-07-10 MED ORDER — CYCLOBENZAPRINE HCL 10 MG PO TABS
10.0000 mg | ORAL_TABLET | Freq: Once | ORAL | Status: AC
  Administered 2019-07-10: 10 mg via ORAL
  Filled 2019-07-10: qty 1

## 2019-07-10 MED ORDER — CYCLOBENZAPRINE HCL 5 MG PO TABS: 5.0000 mg | ORAL_TABLET | Freq: Three times a day (TID) | ORAL | 0 refills | Status: DC | PRN

## 2019-07-10 MED ORDER — ACETAMINOPHEN 500 MG PO TABS
1000.0000 mg | ORAL_TABLET | Freq: Once | ORAL | Status: AC
  Administered 2019-07-10: 1000 mg via ORAL
  Filled 2019-07-10: qty 2

## 2019-07-10 NOTE — Discharge Instructions (Signed)
General Headache Without Cause A headache is pain or discomfort felt around the head or neck area. The specific cause of a headache may not be found. There are many causes and types of headaches. A few common ones are:  Tension headaches.  Migraine headaches.  Cluster headaches.  Chronic daily headaches. Follow these instructions at home: Watch your condition for any changes. Let your health care provider know about them. Take these steps to help with your condition: Managing pain      Take over-the-counter and prescription medicines only as told by your health care provider.  Lie down in a dark, quiet room when you have a headache.  If directed, put ice on your head and neck area: ? Put ice in a plastic bag. ? Place a towel between your skin and the bag. ? Leave the ice on for 20 minutes, 2-3 times per day.  If directed, apply heat to the affected area. Use the heat source that your health care provider recommends, such as a moist heat pack or a heating pad. ? Place a towel between your skin and the heat source. ? Leave the heat on for 20-30 minutes. ? Remove the heat if your skin turns bright red. This is especially important if you are unable to feel pain, heat, or cold. You may have a greater risk of getting burned.  Keep lights dim if bright lights bother you or make your headaches worse. Eating and drinking  Eat meals on a regular schedule.  If you drink alcohol: ? Limit how much you use to:  0-1 drink a day for women.  0-2 drinks a day for men. ? Be aware of how much alcohol is in your drink. In the [place] one drink equals one 12 oz bottle of beer (355 mL), one 5 oz glass of wine (148 mL), or one 1 oz glass of hard liquor (44 mL).  Stop drinking caffeine, or decrease the amount of caffeine you drink. General instructions   Keep a headache journal to help find out what may trigger your headaches. For example, write down: ? What you eat and drink. ? How much  sleep you get. ? Any change to your diet or medicines.  Try massage or other relaxation techniques.  Limit stress.  Sit up straight, and do not tense your muscles.  Do not use any products that contain nicotine or tobacco, such as cigarettes, e-cigarettes, and chewing tobacco. If you need help quitting, ask your health care provider.  Exercise regularly as told by your health care provider.  Sleep on a regular schedule. Get 7-9 hours of sleep each night, or the amount recommended by your health care provider.  Keep all follow-up visits as told by your health care provider. This is important. Contact a health care provider if:  Your symptoms are not helped by medicine.  You have a headache that is different from the usual headache.  You have nausea or you vomit.  You have a fever. Get help right away if:  Your headache becomes severe quickly.  Your headache gets worse after moderate to intense physical activity.  You have repeated vomiting.  You have a stiff neck.  You have a loss of vision.  You have problems with speech.  You have pain in the eye or ear.  You have muscular weakness or loss of muscle control.  You lose your balance or have trouble walking.  You feel faint or pass out.  You have confusion.  You have a seizure. Summary  A headache is pain or discomfort felt around the head or neck area.  There are many causes and types of headaches. In some cases, the cause may not be found.  Keep a headache journal to help find out what may trigger your headaches. Watch your condition for any changes. Let your health care provider know about them.  Contact a health care provider if you have a headache that is different from the usual headache, or if your symptoms are not helped by medicine.  Get help right away if your headache becomes severe, you vomit, you have a loss of vision, you lose your balance, or you have a seizure. This information is not  intended to replace advice given to you by your health care provider. Make sure you discuss any questions you have with your health care provider. Document Released: 07/29/2005 Document Revised: 02/16/2018 Document Reviewed: 02/16/2018 Elsevier Patient Education  2020 [employer].

## 2019-07-10 NOTE — MAU Provider Note (Signed)
Chief Complaint: Headache   First Provider Initiated Contact with Patient 07/10/19 1130     SUBJECTIVE HPI: Jody Robinson is a 28 y.o. G3P1011 at [redacted]w[redacted]d who presents to Maternity Admissions reporting headache. Has history of headaches. Does not see a neurologist. Reports frontal headache that she describes as aching. Rates pain 7.5/10. Nothing makes better or worse. No photophobia or phonophobia. Denies nausea but states she vomited once this morning due to the pain. Took 2 ES tylenol yesterday without relief.  Denies fever/chills, head trauma, abdominal pain or vaginal bleeding.    Past Medical History:  Diagnosis Date  . Vaginal Pap smear, abnormal    OB History  Gravida Para Term Preterm AB Living  3 1 1   1 1   SAB TAB Ectopic Multiple Live Births  1       1    # Outcome Date GA Lbr Len/2nd Weight Sex Delivery Anes PTL Lv  3 Current           2 SAB 01/2019          1 Term      Vag-Spont      Past Surgical History:  Procedure Laterality Date  . NO PAST SURGERIES     Social History   Socioeconomic History  . Marital status: Single    Spouse name: Not on file  . Number of children: Not on file  . Years of education: Not on file  . Highest education level: Not on file  Occupational History  . Not on file  Social Needs  . Financial resource strain: Not on file  . Food insecurity    Worry: Not on file    Inability: Not on file  . Transportation needs    Medical: Not on file    Non-medical: Not on file  Tobacco Use  . Smoking status: Never Smoker  . Smokeless tobacco: Never Used  Substance and Sexual Activity  . Alcohol use: Not Currently    Comment: occasional  . Drug use: No  . Sexual activity: Yes    Birth control/protection: None  Lifestyle  . Physical activity    Days per week: Not on file    Minutes per session: Not on file  . Stress: Not on file  Relationships  . Social Herbalist on phone: Not on file    Gets together: Not on file     Attends religious service: Not on file    Active member of club or organization: Not on file    Attends meetings of clubs or organizations: Not on file    Relationship status: Not on file  . Intimate partner violence    Fear of current or ex partner: Not on file    Emotionally abused: Not on file    Physically abused: Not on file    Forced sexual activity: Not on file  Other Topics Concern  . Not on file  Social History Narrative  . Not on file   Family History  Problem Relation Age of Onset  . Diabetes Mother    No current facility-administered medications on file prior to encounter.    Current Outpatient Medications on File Prior to Encounter  Medication Sig Dispense Refill  . docusate sodium (COLACE) 100 MG capsule Take 100 mg by mouth 2 (two) times daily as needed for mild constipation.    . Doxylamine-Pyridoxine 10-10 MG TBEC Take 2 tablets by mouth daily. Take 2 tablets at bedtime. If symptoms persist,  add 1 tab in the AM starting on day 3. If symptoms persist, add 1 tab in the PM starting day 4. 100 tablet 1  . escitalopram (LEXAPRO) 10 MG tablet Take 1 tablet (10 mg total) by mouth daily. 30 tablet 0  . zolpidem (AMBIEN) 5 MG tablet Take 1 tablet (5 mg total) by mouth at bedtime as needed for sleep. 30 tablet 1   No Known Allergies  I have reviewed patient's Past Medical Hx, Surgical Hx, Family Hx, Social Hx, medications and allergies.   Review of Systems  Constitutional: Negative.   Eyes: Negative for photophobia.  Gastrointestinal: Positive for vomiting. Negative for abdominal pain and nausea.  Neurological: Positive for headaches.    OBJECTIVE Patient Vitals for the past 24 hrs:  BP Temp Temp src Pulse Resp SpO2  07/10/19 1322 117/72 - - - 16 -  07/10/19 1102 121/71 98 F (36.7 C) Oral 100 16 98 %   Constitutional: Well-developed, well-nourished female in no acute distress.  Cardiovascular: normal rate & rhythm, no murmur Respiratory: normal rate and effort.  Lung sounds clear throughout GI: Abd soft, non-tender, Pos BS x 4. No guarding or rebound tenderness MS: Extremities nontender, no edema, normal ROM Neurologic: Alert and oriented x 4.      LAB RESULTS Results for orders placed or performed during the hospital encounter of 07/10/19 (from the past 24 hour(s))  Urinalysis, Routine w reflex microscopic     Status: Abnormal   Collection Time: 07/10/19 11:08 AM  Result Value Ref Range   Color, Urine STRAW (A) YELLOW   APPearance CLEAR CLEAR   Specific Gravity, Urine 1.006 1.005 - 1.030   pH 8.0 5.0 - 8.0   Glucose, UA NEGATIVE NEGATIVE mg/dL   Hgb urine dipstick NEGATIVE NEGATIVE   Bilirubin Urine NEGATIVE NEGATIVE   Ketones, ur NEGATIVE NEGATIVE mg/dL   Protein, ur NEGATIVE NEGATIVE mg/dL   Nitrite NEGATIVE NEGATIVE   Leukocytes,Ua NEGATIVE NEGATIVE    IMAGING No results found.  MAU COURSE Orders Placed This Encounter  Procedures  . Urinalysis, Routine w reflex microscopic  . Discharge patient   Meds ordered this encounter  Medications  . acetaminophen (TYLENOL) tablet 1,000 mg  . cyclobenzaprine (FLEXERIL) tablet 10 mg  . cyclobenzaprine (FLEXERIL) 5 MG tablet    Sig: Take 1 tablet (5 mg total) by mouth 3 (three) times daily as needed for muscle spasms.    Dispense:  20 tablet    Refill:  0    Order Specific Question:   Supervising Provider    Answer:   CONSTANT, PEGGY [[id number]]    MDM Pt denies abdominal pain or vaginal bleeding  Tylenol & flexeril given for headache. Patient reports resolution in pain  ASSESSMENT 1. Pregnancy headache in first trimester     PLAN Discharge home in stable condition. Discussed reasons to return to MAU Rx flexeril  Follow-up Information    [facility] Follow up.   Specialty: Obstetrics and Gynecology Why: return for worsening symptoms Contact information: [address] [id number] [place] [phone]          Allergies as of 07/10/2019   No Known Allergies     Medication List    STOP taking these medications   Butalbital-APAP-Caffeine 50-325-40 MG capsule     TAKE these medications   cyclobenzaprine 5 MG tablet Commonly known as: FLEXERIL Take 1 tablet (5 mg total) by mouth 3 (three) times daily as needed for muscle spasms.  docusate sodium 100 MG capsule Commonly known as: COLACE Take 100 mg by mouth 2 (two) times daily as needed for mild constipation.   Doxylamine-Pyridoxine 10-10 MG Tbec Take 2 tablets by mouth daily. Take 2 tablets at bedtime. If symptoms persist, add 1 tab in the AM starting on day 3. If symptoms persist, add 1 tab in the PM starting day 4.   escitalopram 10 MG tablet Commonly known as: Lexapro Take 1 tablet (10 mg total) by mouth daily.   zolpidem 5 MG tablet Commonly known as: AMBIEN Take 1 tablet (5 mg total) by mouth at bedtime as needed for sleep.        Jorje Guild, NP 07/10/2019  1:40 PM

## 2019-07-10 NOTE — MAU Note (Signed)
Pt reports to mau with c/o headache for past 2 days.  Pt reports that she took 2 tylenol extra strength yesterday around 8pm before going to bed.  Pt reports she woke up with the headache again today but has not tried taking any meds for it  again today.  Pt denies abd pain, or vag bleeding.

## 2019-07-13 ENCOUNTER — Ambulatory Visit (INDEPENDENT_AMBULATORY_CARE_PROVIDER_SITE_OTHER): Payer: 59 | Admitting: *Deleted

## 2019-07-13 ENCOUNTER — Other Ambulatory Visit: Payer: Self-pay

## 2019-07-13 DIAGNOSIS — F32A Depression, unspecified: Secondary | ICD-10-CM | POA: Insufficient documentation

## 2019-07-13 DIAGNOSIS — O021 Missed abortion: Secondary | ICD-10-CM

## 2019-07-13 DIAGNOSIS — O0990 Supervision of high risk pregnancy, unspecified, unspecified trimester: Secondary | ICD-10-CM | POA: Insufficient documentation

## 2019-07-13 DIAGNOSIS — F329 Major depressive disorder, single episode, unspecified: Secondary | ICD-10-CM | POA: Insufficient documentation

## 2019-07-13 DIAGNOSIS — F3289 Other specified depressive episodes: Secondary | ICD-10-CM

## 2019-07-13 DIAGNOSIS — Z3490 Encounter for supervision of normal pregnancy, unspecified, unspecified trimester: Secondary | ICD-10-CM

## 2019-07-13 NOTE — Patient Instructions (Signed)

## 2019-07-13 NOTE — Progress Notes (Signed)
I connected with  Jody Robinson on 07/13/19 at  8:30 AM EST by telephone and verified that I am speaking with the correct person using two identifiers.   I discussed the limitations, risks, security and privacy concerns of performing an evaluation and management service by telephone and the availability of in person appointments. I also discussed with the patient that there may be a patient responsible charge related to this service. The patient expressed understanding and agreed to proceed. Explained I am completing her New OB Intake today. We discussed Her EDD and that it is based on  sure LMP . I reviewed her allergies, meds, OB History, Medical /Surgical history, and appropriate screenings. I explained I will send her the [employer] app- app sent to her while on phone.  I explained we ask all out patients to take their blood pressure weekly and I asked if she has a blood pressure cuff. She states she does not. I asked if she can purchase a blood pressure cuff since it is not covered by her insurance. I asked her to bring the blood pressure cuff with her to her first in person ob appointment so we can show her how to use it. Explained  then we will have her take her blood pressure weekly and enter into the app. Explained she will have some visits in office and some virtually. She already has Community education officer. Reviewed appointment date/ time with her , our location and to wear mask, no visitors. Explained she will have exam, ob bloodwork, hemoglobin a1C, cbg , genetic testing if desired,. I scheduled an [name] at 19 weeks and gave her the appointment. I informed her that her first appointment with provider will be virtual - then the next one may be in person. I also informed her that due to being pregnant she is at higher risk to get very sick with covid and we recommend her employer take her out of dealing with public if possible . I informed her I am putting a Covid in pregnancy letter in her chart.  She voices  understanding.  I also informed her about our [facility] services. She states she would like an appointment because she is ok now ; but at one time was going to take anti- depressant medication. I informed her that registrars will schedule and contact her with appointment.  Patient and/or legal guardian verbally consented to [facility] services about presenting concerns and psychiatric consultation as appropriate.    Ayanni Tun,RN 07/13/2019  8:30 AM

## 2019-07-20 ENCOUNTER — Telehealth: Payer: 59 | Admitting: Student

## 2019-07-20 ENCOUNTER — Other Ambulatory Visit: Payer: Self-pay

## 2019-07-20 NOTE — BH Specialist Note (Signed)
Integrated Behavioral Health via Telemedicine Video Visit  07/20/2019 Celia Herman [id number]  Number of [place] visits: 2 Session Start time: 3:38  Session End time: 3:54 Total time: 16  Referring Provider: Maye Hides, CNM Type of Visit: Video Patient/Family location: Home [facility] Provider location: [facility] All persons participating in visit: Patient Niela Raible and [place]    Confirmed patient's address: Yes  Confirmed patient's phone number: Yes  Any changes to demographics: No   Confirmed patient's insurance: Yes  Any changes to patient's insurance: No   Discussed confidentiality: Yes   I connected with [name] by a video enabled telemedicine application and verified that I am speaking with the correct person using two identifiers.     I discussed the limitations of evaluation and management by telemedicine and the availability of in person appointments.  I discussed that the purpose of this visit is to provide behavioral health care while limiting exposure to the novel coronavirus.   Discussed there is a possibility of technology failure and discussed alternative modes of communication if that failure occurs.  I discussed that engaging in this video visit, they consent to the provision of behavioral healthcare and the services will be billed under their insurance.  Patient and/or legal guardian expressed understanding and consented to video visit: Yes   PRESENTING CONCERNS: Patient and/or family reports the following symptoms/concerns: Pt states she stopped taking Lexapro with positive pregnancy, primary symptoms today are fatigue, lack of appetite, sleep difficulty, and irritability, all she attributes to early pregnancy. Pt requests to have lab work done at 08/10/19 ob visit, and requests nausea medication that is covered by her insurance. Pt has no other concerns at this time.  Duration of problem: Current pregnancy;  Severity of problem: moderate  STRENGTHS (Protective Factors/Coping Skills): Taking prenatal vitamin daily  GOALS ADDRESSED: Patient will: 1.  Reduce symptoms of: fatigue, difficulty staying asleep, poor appetite; irritability  2.  Increase knowledge and/or ability of: healthy habits  3.  Demonstrate ability to: Increase healthy adjustment to current life circumstances  INTERVENTIONS: Interventions utilized:  Psychoeducation and/or Health Education Standardized Assessments completed: GAD-7 and PHQ 9  ASSESSMENT: Patient currently experiencing Positive depression screen.   Patient may benefit from psychoeducation and brief therapeutic interventions regarding coping with symptoms of depression and anxiety .  PLAN: 1. Follow up with behavioral health clinician on : As needed 2. Behavioral recommendations:  -Continue taking medication as prescribed, including prenatal vitamins -Talk to medical provider at upcoming visit about safety of [place] medication and pregnancy -Nurse will call back concerning medical inquiries (will visit on 08/10/19 include labwork; medication for nausea covered by [employer]) 3. Referral(s): [place] (In Clinic)  I discussed the assessment and treatment plan with the patient and/or parent/guardian. They were provided an opportunity to ask questions and all were answered. They agreed with the plan and demonstrated an understanding of the instructions.   They were advised to call back or seek an in-person evaluation if the symptoms worsen or if the condition fails to improve as anticipated.  Caroleen Hamman [place]   Depression screen [facility] 2/9 07/22/2019 07/13/2019 02/15/2019  Decreased Interest 1 0 3  Down, Depressed, Hopeless 0 0 3  PHQ - 2 Score 1 0 6  Altered sleeping 3 0 3  Tired, decreased energy 3 1 3   Change in appetite 3 0 2  Feeling bad or failure about yourself  0 0 0  Trouble concentrating 0 0 0  Moving  slowly or  fidgety/restless 0 0 1  Suicidal thoughts 0 0 0  PHQ-9 Score 10 1 15    GAD 7 : Generalized Anxiety Score 07/22/2019 07/22/2019 07/13/2019 02/15/2019  Nervous, Anxious, on Edge - 0 0 0  Control/stop worrying - 1 0 0  Worry too much - different things - 0 0 0  Trouble relaxing - 1 0 3  Restless - 0 0 2  Easily annoyed or irritable 3 3 1 3   Afraid - awful might happen 0 0 1 0  Total GAD 7 Score - 5 2 8

## 2019-07-22 ENCOUNTER — Ambulatory Visit (INDEPENDENT_AMBULATORY_CARE_PROVIDER_SITE_OTHER): Payer: 59 | Admitting: Clinical

## 2019-07-22 ENCOUNTER — Other Ambulatory Visit: Payer: Self-pay

## 2019-07-22 DIAGNOSIS — F3289 Other specified depressive episodes: Secondary | ICD-10-CM | POA: Diagnosis not present

## 2019-07-22 DIAGNOSIS — Z1331 Encounter for screening for depression: Secondary | ICD-10-CM

## 2019-07-23 ENCOUNTER — Telehealth: Payer: Self-pay | Admitting: Lactation Services

## 2019-07-23 NOTE — Telephone Encounter (Signed)
Received message from [facility] that pt had questions about labs and nausea meds. Attempted to call pt. LM for pt to call the office at her earliest convenience and to check her my chart message.

## 2019-07-26 ENCOUNTER — Other Ambulatory Visit: Payer: Self-pay

## 2019-07-26 ENCOUNTER — Inpatient Hospital Stay (HOSPITAL_COMMUNITY)
Admission: AD | Admit: 2019-07-26 | Discharge: 2019-07-26 | Disposition: A | Discharge status: Home / Self Care | Payer: 59 | Attending: Obstetrics & Gynecology | Admitting: Obstetrics & Gynecology

## 2019-07-26 ENCOUNTER — Encounter (HOSPITAL_COMMUNITY): Payer: Self-pay | Admitting: Obstetrics & Gynecology

## 2019-07-26 DIAGNOSIS — Z3A01 Less than 8 weeks gestation of pregnancy: Secondary | ICD-10-CM | POA: Diagnosis not present

## 2019-07-26 DIAGNOSIS — O219 Vomiting of pregnancy, unspecified: Secondary | ICD-10-CM | POA: Diagnosis present

## 2019-07-26 LAB — URINALYSIS, ROUTINE W REFLEX MICROSCOPIC
Bilirubin Urine: NEGATIVE
Glucose, UA: NEGATIVE mg/dL
Ketones, ur: 80 mg/dL — AB
Nitrite: NEGATIVE
Protein, ur: 100 mg/dL — AB
Specific Gravity, Urine: 1.034 — ABNORMAL HIGH (ref 1.005–1.030)
pH: 6 (ref 5.0–8.0)

## 2019-07-26 MED ORDER — PROMETHAZINE HCL 25 MG/ML IJ SOLN
25.0000 mg | Freq: Once | INTRAVENOUS | Status: AC
  Administered 2019-07-26: 25 mg via INTRAVENOUS
  Filled 2019-07-26: qty 1

## 2019-07-26 MED ORDER — METOCLOPRAMIDE HCL 10 MG PO TABS: 10.0000 mg | ORAL_TABLET | Freq: Three times a day (TID) | ORAL | 0 refills | Status: DC | PRN

## 2019-07-26 MED ORDER — FAMOTIDINE IN NACL 20-0.9 MG/50ML-% IV SOLN
20.0000 mg | Freq: Once | INTRAVENOUS | Status: AC
  Administered 2019-07-26: 20 mg via INTRAVENOUS
  Filled 2019-07-26: qty 50

## 2019-07-26 MED ORDER — M.V.I. ADULT IV INJ
Freq: Once | INTRAVENOUS | Status: AC
  Administered 2019-07-26: 18:00:00 via INTRAVENOUS
  Filled 2019-07-26: qty 1000

## 2019-07-26 NOTE — MAU Provider Note (Signed)
Chief Complaint: Nausea and Emesis   First Provider Initiated Contact with Patient 07/26/19 1722     SUBJECTIVE HPI: Jody Robinson is a 28 y.o. G3P1011 at [redacted]w[redacted]d who presents to Maternity Admissions reporting nausea & vomiting. States has had trouble keeping anything down for the last 4 days. Has vomited 4 times today. Was previously prescribed diclegis but her insurance wouldn't cover it so hasn't been taking anything for her symptoms. Also reports headache today that improved prior to arrival.   Location: head Quality: aching Severity: 10/10 on pain scale Duration: since this morning Timing: constant Modifying factors: none Associated signs and symptoms: n/v  Past Medical History:  Diagnosis Date  . Depression   . Headache   . Vaginal Pap smear, abnormal    OB History  Gravida Para Term Preterm AB Living  3 1 1   1 1   SAB TAB Ectopic Multiple Live Births  1       1    # Outcome Date GA Lbr Len/2nd Weight Sex Delivery Anes PTL Lv  3 Current           2 SAB 01/2019 [redacted]w[redacted]d         1 Term 03/08/13 [redacted]w[redacted]d  2495 g  Vag-Spont EPI  LIV     Birth Comments: no complications   Past Surgical History:  Procedure Laterality Date  . COLPOSCOPY     Social History   Socioeconomic History  . Marital status: Single    Spouse name: Not on file  . Number of children: Not on file  . Years of education: Not on file  . Highest education level: Not on file  Occupational History  . Not on file  Tobacco Use  . Smoking status: Never Smoker  . Smokeless tobacco: Never Used  Substance and Sexual Activity  . Alcohol use: Not Currently    Comment: occasional  . Drug use: No  . Sexual activity: Yes    Birth control/protection: None  Other Topics Concern  . Not on file  Social History Narrative  . Not on file   Social Determinants of Health   Financial Resource Strain:   . Difficulty of Paying Living Expenses: Not on file  Food Insecurity: No Food Insecurity  . Worried About Paediatric nurse in the Last Year: Never true  . Ran Out of Food in the Last Year: Never true  Transportation Needs: No Transportation Needs  . Lack of Transportation (Medical): No  . Lack of Transportation (Non-Medical): No  Physical Activity:   . Days of Exercise per Week: Not on file  . Minutes of Exercise per Session: Not on file  Stress:   . Feeling of Stress : Not on file  Social Connections:   . Frequency of Communication with Friends and Family: Not on file  . Frequency of Social Gatherings with Friends and Family: Not on file  . Attends Religious Services: Not on file  . Active Member of Clubs or Organizations: Not on file  . Attends Archivist Meetings: Not on file  . Marital Status: Not on file  Intimate Partner Violence:   . Fear of Current or Ex-Partner: Not on file  . Emotionally Abused: Not on file  . Physically Abused: Not on file  . Sexually Abused: Not on file   Family History  Problem Relation Age of Onset  . Diabetes Mother    No current facility-administered medications on file prior to encounter.   Current Outpatient Medications  on File Prior to Encounter  Medication Sig Dispense Refill  . cyclobenzaprine (FLEXERIL) 5 MG tablet Take 1 tablet (5 mg total) by mouth 3 (three) times daily as needed for muscle spasms. 20 tablet 0  . docusate sodium (COLACE) 100 MG capsule Take 100 mg by mouth 2 (two) times daily as needed for mild constipation.    . Prenatal MV-Min-FA-Omega-3 (PRENATAL GUMMIES/DHA & FA PO) Take 2 tablets by mouth daily.     No Known Allergies  I have reviewed patient's Past Medical Hx, Surgical Hx, Family Hx, Social Hx, medications and allergies.   Review of Systems  Constitutional: Negative.   Gastrointestinal: Positive for nausea and vomiting. Negative for abdominal pain.  Genitourinary: Negative.   Neurological: Positive for headaches.    OBJECTIVE Patient Vitals for the past 24 hrs:  BP Temp Temp src Pulse Resp SpO2 Height  Weight  07/26/19 1930 118/83 -- -- 83 18 -- -- --  07/26/19 1611 -- 98.4 F (36.9 C) Oral -- -- -- -- --  07/26/19 1609 125/65 -- -- (!) 56 18 100 % 5' (1.524 m) 66 kg   Constitutional: Well-developed, well-nourished female in no acute distress.  Cardiovascular: normal rate & rhythm, no murmur Respiratory: normal rate and effort. Lung sounds clear throughout GI: Abd soft, non-tender, Pos BS x 4. No guarding or rebound tenderness MS: Extremities nontender, no edema, normal ROM Neurologic: Alert and oriented x 4.    LAB RESULTS Results for orders placed or performed during the hospital encounter of 07/26/19 (from the past 24 hour(s))  Urinalysis, Routine w reflex microscopic     Status: Abnormal   Collection Time: 07/26/19  3:52 PM  Result Value Ref Range   Color, Urine AMBER (A) YELLOW   APPearance HAZY (A) CLEAR   Specific Gravity, Urine 1.034 (H) 1.005 - 1.030   pH 6.0 5.0 - 8.0   Glucose, UA NEGATIVE NEGATIVE mg/dL   Hgb urine dipstick MODERATE (A) NEGATIVE   Bilirubin Urine NEGATIVE NEGATIVE   Ketones, ur 80 (A) NEGATIVE mg/dL   Protein, ur 100 (A) NEGATIVE mg/dL   Nitrite NEGATIVE NEGATIVE   Leukocytes,Ua MODERATE (A) NEGATIVE   RBC / HPF 21-50 0 - 5 RBC/hpf   WBC, UA 6-10 0 - 5 WBC/hpf   Bacteria, UA FEW (A) NONE SEEN   Squamous Epithelial / LPF 6-10 0 - 5   Mucus PRESENT    Hyaline Casts, UA PRESENT     IMAGING No results found.  MAU COURSE Orders Placed This Encounter  Procedures  . Urinalysis, Routine w reflex microscopic  . Discharge patient   Meds ordered this encounter  Medications  . promethazine (PHENERGAN) 25 mg in lactated ringers 1,000 mL infusion  . lactated ringers 1,000 mL with multivitamins adult (INFUVITE ADULT) 10 mL infusion  . famotidine (PEPCID) IVPB 20 mg premix  . metoCLOPramide (REGLAN) 10 MG tablet    Sig: Take 1 tablet (10 mg total) by mouth every 8 (eight) hours as needed for nausea.    Dispense:  30 tablet    Refill:  0    Order  Specific Question:   Supervising Provider    Answer:   Wood[place] Mode [id number]    MDM No abdominal pain or vaginal bleeding today  U/a with 80 of ketones & high SG. IV fluids ordered, LR with 25 mg of phenergan followed by a liter of MVI.  Pt reports improvement in symptoms and able to tolerate crackers & juice. Will send home  with rx for reglan & info for taking OTC version of diclegis.   Pt reports resolution of headache after arriving to MAU without intervention.   ASSESSMENT 1. Nausea and vomiting during pregnancy prior to [redacted] weeks gestation   2. [redacted] weeks gestation of pregnancy     PLAN Discharge home in stable condition. Discussed reasons to return to MAU Rx reglan  Follow-up Information    [facility] Follow up.   Specialty: Obstetrics and Gynecology Why: return for worsening symptoms Contact information: [address] [id number] [place] [phone]         Allergies as of 07/26/2019   No Known Allergies     Medication List    STOP taking these medications   Doxylamine-Pyridoxine 10-10 MG Tbec   escitalopram 10 MG tablet Commonly known as: Lexapro   zolpidem 5 MG tablet Commonly known as: AMBIEN     TAKE these medications   cyclobenzaprine 5 MG tablet Commonly known as: FLEXERIL Take 1 tablet (5 mg total) by mouth 3 (three) times daily as needed for muscle spasms.   docusate sodium 100 MG capsule Commonly known as: COLACE Take 100 mg by mouth 2 (two) times daily as needed for mild constipation.   metoCLOPramide 10 MG tablet Commonly known as: REGLAN Take 1 tablet (10 mg total) by mouth every 8 (eight) hours as needed for nausea.   PRENATAL GUMMIES/DHA & FA PO Take 2 tablets by mouth daily.        Jorje Guild, NP 07/26/2019  7:51 PM

## 2019-07-26 NOTE — Discharge Instructions (Signed)
Nausea medication to take during pregnancy:  Unisom (doxylamine succinate 25 mg tablets) Take one tablet daily at bedtime. If symptoms are not adequately controlled, the dose can be increased to a maximum recommended dose of two tablets daily (1/2 tablet in the morning, 1/2 tablet mid-afternoon and one at bedtime). Vitamin B6 100mg  tablets. Take one tablet twice a day (up to 200 mg per day).      Morning Sickness  Morning sickness is when a woman feels nauseous during pregnancy. This nauseous feeling may or may not come with vomiting. It often occurs in the morning, but it can be a problem at any time of day. Morning sickness is most common during the first trimester. In some cases, it may continue throughout pregnancy. Although morning sickness is unpleasant, it is usually harmless unless the woman develops severe and continual vomiting (hyperemesis gravidarum), a condition that requires more intense treatment. What are the causes? The exact cause of this condition is not known, but it seems to be related to normal hormonal changes that occur in pregnancy. What increases the risk? You are more likely to develop this condition if:  You experienced nausea or vomiting before your pregnancy.  You had morning sickness during a previous pregnancy.  You are pregnant with more than one baby, such as twins. What are the signs or symptoms? Symptoms of this condition include:  Nausea.  Vomiting. How is this diagnosed? This condition is usually diagnosed based on your signs and symptoms. How is this treated? In many cases, treatment is not needed for this condition. Making some changes to what you eat may help to control symptoms. Your health care provider may also prescribe or recommend:  Vitamin B6 supplements.  Anti-nausea medicines.  Ginger. Follow these instructions at home: Medicines  Take over-the-counter and prescription medicines only as told by your health care provider. Do  not use any prescription, over-the-counter, or herbal medicines for morning sickness without first talking with your health care provider.  Taking multivitamins before getting pregnant can prevent or decrease the severity of morning sickness in most women. Eating and drinking  Eat a piece of dry toast or crackers before getting out of bed in the morning.  Eat 5 or 6 small meals a day.  Eat dry and bland foods, such as rice or a baked potato. Foods that are high in carbohydrates are often helpful.  Avoid greasy, fatty, and spicy foods.  Have someone cook for you if the smell of any food causes nausea and vomiting.  If you feel nauseous after taking prenatal vitamins, take the vitamins at night or with a snack.  Snack on protein foods between meals if you are hungry. Nuts, yogurt, and cheese are good options.  Drink fluids throughout the day.  Try ginger ale made with real ginger, ginger tea made from fresh grated ginger, or ginger candies. General instructions  Do not use any products that contain nicotine or tobacco, such as cigarettes and e-cigarettes. If you need help quitting, ask your health care provider.  Get an air purifier to keep the air in your house free of odors.  Get plenty of fresh air.  Try to avoid odors that trigger your nausea.  Consider trying these methods to help relieve symptoms: ? Wearing an acupressure wristband. These wristbands are often worn for seasickness. ? Acupuncture. Contact a health care provider if:  Your home remedies are not working and you need medicine.  You feel dizzy or light-headed.  You are losing  weight. Get help right away if:  You have persistent and uncontrolled nausea and vomiting.  You faint.  You have severe pain in your abdomen. Summary  Morning sickness is when a woman feels nauseous during pregnancy. This nauseous feeling may or may not come with vomiting.  Morning sickness is most common during the first  trimester.  It often occurs in the morning, but it can be a problem at any time of day.  In many cases, treatment is not needed for this condition. Making some changes to what you eat may help to control symptoms. This information is not intended to replace advice given to you by your health care provider. Make sure you discuss any questions you have with your health care provider. Document Released: 09/19/2006 Document Revised: 07/11/2017 Document Reviewed: 08/31/2016 Elsevier Patient Education  2020 [employer].

## 2019-07-26 NOTE — MAU Note (Signed)
PT is G3P1 at [redacted]w[redacted]d with nausea and vomiting for 4 days. Also has headache.  No pregancy complaints.

## 2019-08-03 ENCOUNTER — Telehealth (INDEPENDENT_AMBULATORY_CARE_PROVIDER_SITE_OTHER): Payer: 59 | Admitting: General Practice

## 2019-08-03 ENCOUNTER — Encounter (HOSPITAL_COMMUNITY): Payer: Self-pay | Admitting: Family Medicine

## 2019-08-03 ENCOUNTER — Inpatient Hospital Stay (HOSPITAL_COMMUNITY)
Admission: AD | Admit: 2019-08-03 | Discharge: 2019-08-04 | Disposition: A | Discharge status: Home / Self Care | Payer: 59 | Attending: Family Medicine | Admitting: Family Medicine

## 2019-08-03 ENCOUNTER — Other Ambulatory Visit: Payer: Self-pay

## 2019-08-03 ENCOUNTER — Other Ambulatory Visit: Payer: Self-pay | Admitting: Advanced Practice Midwife

## 2019-08-03 DIAGNOSIS — Z3A08 8 weeks gestation of pregnancy: Secondary | ICD-10-CM | POA: Diagnosis not present

## 2019-08-03 DIAGNOSIS — Z3A09 9 weeks gestation of pregnancy: Secondary | ICD-10-CM | POA: Insufficient documentation

## 2019-08-03 DIAGNOSIS — O99281 Endocrine, nutritional and metabolic diseases complicating pregnancy, first trimester: Secondary | ICD-10-CM | POA: Insufficient documentation

## 2019-08-03 DIAGNOSIS — R519 Headache, unspecified: Secondary | ICD-10-CM

## 2019-08-03 DIAGNOSIS — O219 Vomiting of pregnancy, unspecified: Secondary | ICD-10-CM | POA: Insufficient documentation

## 2019-08-03 DIAGNOSIS — E860 Dehydration: Secondary | ICD-10-CM | POA: Diagnosis not present

## 2019-08-03 LAB — URINALYSIS, ROUTINE W REFLEX MICROSCOPIC
Bilirubin Urine: NEGATIVE
Glucose, UA: NEGATIVE mg/dL
Ketones, ur: 80 mg/dL — AB
Nitrite: NEGATIVE
Protein, ur: >=300 mg/dL — AB
Specific Gravity, Urine: 1.033 — ABNORMAL HIGH (ref 1.005–1.030)
pH: 6 (ref 5.0–8.0)

## 2019-08-03 MED ORDER — PROMETHAZINE HCL 25 MG PO TABS: 25.0000 mg | ORAL_TABLET | Freq: Four times a day (QID) | ORAL | 2 refills | Status: DC | PRN

## 2019-08-03 MED ORDER — LACTATED RINGERS IV SOLN: Freq: Once | INTRAVENOUS | Status: AC

## 2019-08-03 MED ORDER — PROMETHAZINE HCL 25 MG/ML IJ SOLN
12.5000 mg | Freq: Once | INTRAMUSCULAR | Status: AC
  Administered 2019-08-03: 12.5 mg via INTRAVENOUS
  Filled 2019-08-03: qty 1

## 2019-08-03 MED ORDER — BUTALBITAL-APAP-CAFFEINE 50-325-40 MG PO TABS: 1.0000 | ORAL_TABLET | Freq: Four times a day (QID) | ORAL | 0 refills | Status: DC | PRN

## 2019-08-03 MED ORDER — LACTATED RINGERS IV BOLUS
1000.0000 mL | Freq: Once | INTRAVENOUS | Status: AC
  Administered 2019-08-03: 1000 mL via INTRAVENOUS

## 2019-08-03 NOTE — Telephone Encounter (Signed)
Patient called into front office requesting a call back from the nurse regarding her headaches.   Called patient and she states she has near daily headaches for the past two weeks. She reports extra strength tylenol does not help. She went to MAU on 12/14 but nothing was given for headaches. She states the headaches make it difficult to work. Spoke with Marcille Buffy who prescribed fiorcet & informed patient. Patient verbalized understanding & had no questions.

## 2019-08-03 NOTE — MAU Provider Note (Signed)
Chief Complaint: Emesis During Pregnancy   First Provider Initiated Contact with Patient 08/03/19 2134        SUBJECTIVE HPI: Jody Robinson is a 28 y.o. G3P1011 at [redacted]w[redacted]d by LMP who presents to maternity admissions reporting nausea and vomiting all day.  Never filled Diclegis.  States insurance would not cover.  So last provider told her how to buy OTC, but she did not do that.  Only tried Reglan.  Has not tried Phenergan other than in MAU.  Wants to go out on FMLA from work but has not contacted employer or clinic. She denies vaginal bleeding, vaginal itching/burning, urinary symptoms, h/a, dizziness, or fever/chills.    Emesis  This is a recurrent problem. The current episode started today. The problem has been unchanged. There has been no fever. Pertinent negatives include no abdominal pain, chills, diarrhea, dizziness or fever. Treatments tried: Reglan. The treatment provided no relief.   RN note: Patient has ongoing N/V. Patient states she has been vomiting all day. Denies diarrhea. Patient states she has been feeling weak and dehydrated.   Past Medical History:  Diagnosis Date  . Depression   . Headache   . Vaginal Pap smear, abnormal    Past Surgical History:  Procedure Laterality Date  . COLPOSCOPY     Social History   Socioeconomic History  . Marital status: Single    Spouse name: Not on file  . Number of children: Not on file  . Years of education: Not on file  . Highest education level: Not on file  Occupational History  . Not on file  Tobacco Use  . Smoking status: Never Smoker  . Smokeless tobacco: Never Used  Substance and Sexual Activity  . Alcohol use: Not Currently    Comment: occasional  . Drug use: No  . Sexual activity: Yes    Birth control/protection: None  Other Topics Concern  . Not on file  Social History Narrative  . Not on file   Social Determinants of Health   Financial Resource Strain:   . Difficulty of Paying Living Expenses: Not on  file  Food Insecurity: No Food Insecurity  . Worried About Charity fundraiser in the Last Year: Never true  . Ran Out of Food in the Last Year: Never true  Transportation Needs: No Transportation Needs  . Lack of Transportation (Medical): No  . Lack of Transportation (Non-Medical): No  Physical Activity:   . Days of Exercise per Week: Not on file  . Minutes of Exercise per Session: Not on file  Stress:   . Feeling of Stress : Not on file  Social Connections:   . Frequency of Communication with Friends and Family: Not on file  . Frequency of Social Gatherings with Friends and Family: Not on file  . Attends Religious Services: Not on file  . Active Member of Clubs or Organizations: Not on file  . Attends Archivist Meetings: Not on file  . Marital Status: Not on file  Intimate Partner Violence:   . Fear of Current or Ex-Partner: Not on file  . Emotionally Abused: Not on file  . Physically Abused: Not on file  . Sexually Abused: Not on file   No current facility-administered medications on file prior to encounter.   Current Outpatient Medications on File Prior to Encounter  Medication Sig Dispense Refill  . butalbital-acetaminophen-caffeine (FIORICET) 50-325-40 MG tablet Take 1-2 tablets by mouth every 6 (six) hours as needed for headache. 20 tablet 0  .  cyclobenzaprine (FLEXERIL) 5 MG tablet Take 1 tablet (5 mg total) by mouth 3 (three) times daily as needed for muscle spasms. 20 tablet 0  . docusate sodium (COLACE) 100 MG capsule Take 100 mg by mouth 2 (two) times daily as needed for mild constipation.    . metoCLOPramide (REGLAN) 10 MG tablet Take 1 tablet (10 mg total) by mouth every 8 (eight) hours as needed for nausea. 30 tablet 0  . Prenatal MV-Min-FA-Omega-3 (PRENATAL GUMMIES/DHA & FA PO) Take 2 tablets by mouth daily.     No Known Allergies  I have reviewed patient's Past Medical Hx, Surgical Hx, Family Hx, Social Hx, medications and allergies.   ROS:  Review  of Systems  Constitutional: Negative for chills and fever.  Respiratory: Negative for shortness of breath.   Gastrointestinal: Positive for vomiting. Negative for abdominal pain and diarrhea.  Genitourinary: Negative for pelvic pain and vaginal bleeding.  Neurological: Negative for dizziness.   Review of Systems  Other systems negative   Physical Exam  Physical Exam Patient Vitals for the past 24 hrs:  BP Temp Temp src Pulse Resp SpO2 Height Weight  08/03/19 2105 (!) 141/75 98.6 F (37 C) Oral 64 20 100 % 5' (1.524 m) 65.8 kg   Constitutional: Well-developed, female in no acute distress. Appears to be sleeping, easily awakened Cardiovascular: normal rate Respiratory: normal effort GI: Abd soft, non-tender. Pos BS x 4 MS: Extremities nontender, no edema, normal ROM Neurologic: Alert and oriented x 4.  GU: Neg CVAT.  PELVIC EXAM: deferred  LAB RESULTS Results for orders placed or performed during the hospital encounter of 08/03/19 (from the past 24 hour(s))  Urinalysis, Routine w reflex microscopic     Status: Abnormal   Collection Time: 08/03/19  9:21 PM  Result Value Ref Range   Color, Urine AMBER (A) YELLOW   APPearance HAZY (A) CLEAR   Specific Gravity, Urine 1.033 (H) 1.005 - 1.030   pH 6.0 5.0 - 8.0   Glucose, UA NEGATIVE NEGATIVE mg/dL   Hgb urine dipstick SMALL (A) NEGATIVE   Bilirubin Urine NEGATIVE NEGATIVE   Ketones, ur 80 (A) NEGATIVE mg/dL   Protein, ur >=300 (A) NEGATIVE mg/dL   Nitrite NEGATIVE NEGATIVE   Leukocytes,Ua TRACE (A) NEGATIVE   RBC / HPF 21-50 0 - 5 RBC/hpf   WBC, UA 6-10 0 - 5 WBC/hpf   Bacteria, UA RARE (A) NONE SEEN   Squamous Epithelial / LPF 0-5 0 - 5   Mucus PRESENT      --/--/O POS, O POS Performed at [facility], [address] [place], [place] [address]  (06/27 1127)  IMAGING No results found.  MAU Management/MDM: Ordered IV hydration with Phenergan We gave her two liters of fluid She responded well to  Phenergan Was able to tolerate Ginger Ale  ASSESSMENT SIngle intrauterine pregnancy at [redacted]w[redacted]d Nausea and vomiting Dehydration  PLAN Discharge home Rx given for Phenergan for prn use orally or vaginally. Has Reglan and was instructed how to make her own Diclegis using OTC meds  Pt stable at time of discharge. Encouraged to return here or to other Urgent Care/ED if she develops worsening of symptoms, increase in pain, fever, or other concerning symptoms.    Hansel Feinstein CNM, MSN Certified Nurse-Midwife 08/03/2019  9:34 PM

## 2019-08-03 NOTE — Progress Notes (Signed)
Patient called RN line with near daily headaches. Tylenol is not helping. RX sent in for Fioricet PRN #20 tabs.   Marcille Buffy DNP, CNM  08/03/19  11:46 AM

## 2019-08-03 NOTE — MAU Note (Signed)
Patient has ongoing N/V. Patient states she has been vomiting all day. Denies diarrhea. Patient states she has been feeling weak and dehydrated.

## 2019-08-03 NOTE — Discharge Instructions (Signed)
Hyperemesis Gravidarum Hyperemesis gravidarum is a severe form of nausea and vomiting that happens during pregnancy. Hyperemesis is worse than morning sickness. It may cause you to have nausea or vomiting all day for many days. It may keep you from eating and drinking enough food and liquids, which can lead to dehydration, malnutrition, and weight loss. Hyperemesis usually occurs during the first half (the first 20 weeks) of pregnancy. It often goes away once a woman is in her second half of pregnancy. However, sometimes hyperemesis continues through an entire pregnancy. What are the causes? The cause of this condition is not known. It may be related to changes in chemicals (hormones) in the body during pregnancy, such as the high level of pregnancy hormone (human chorionic gonadotropin) or the increase in the female sex hormone (estrogen). What are the signs or symptoms? Symptoms of this condition include:  Nausea that does not go away.  Vomiting that does not allow you to keep any food down.  Weight loss.  Body fluid loss (dehydration).  Having no desire to eat, or not liking food that you have previously enjoyed. How is this diagnosed? This condition may be diagnosed based on:  A physical exam.  Your medical history.  Your symptoms.  Blood tests.  Urine tests. How is this treated? This condition is managed by controlling symptoms. This may include:  Following an eating plan. This can help lessen nausea and vomiting.  Taking prescription medicines. An eating plan and medicines are often used together to help control symptoms. If medicines do not help relieve nausea and vomiting, you may need to receive fluids through an IV at the hospital. Follow these instructions at home: Eating and drinking   Avoid the following: ? Drinking fluids with meals. Try not to drink anything during the 30 minutes before and after your meals. ? Drinking more than 1 cup of fluid at a  time. ? Eating foods that trigger your symptoms. These may include spicy foods, coffee, high-fat foods, very sweet foods, and acidic foods. ? Skipping meals. Nausea can be more intense on an empty stomach. If you cannot tolerate food, do not force it. Try sucking on ice chips or other frozen items and make up for missed calories later. ? Lying down within 2 hours after eating. ? Being exposed to environmental triggers. These may include food smells, smoky rooms, closed spaces, rooms with strong smells, warm or humid places, overly loud and noisy rooms, and rooms with motion or flickering lights. Try eating meals in a well-ventilated area that is free of strong smells. ? Quick and sudden changes in your movement. ? Taking iron pills and multivitamins that contain iron. If you take prescription iron pills, do not stop taking them unless your health care provider approves. ? Preparing food. The smell of food can spoil your appetite or trigger nausea.  To help relieve your symptoms: ? Listen to your body. Everyone is different and has different preferences. Find what works best for you. ? Eat and drink slowly. ? Eat 5-6 small meals daily instead of 3 large meals. Eating small meals and snacks can help you avoid an empty stomach. ? In the morning, before getting out of bed, eat a couple of crackers to avoid moving around on an empty stomach. ? Try eating starchy foods as these are usually tolerated well. Examples include cereal, toast, bread, potatoes, pasta, rice, and pretzels. ? Include at least 1 serving of protein with your meals and snacks. Protein options include   lean meats, poultry, seafood, beans, nuts, nut butters, eggs, cheese, and yogurt. ? Try eating a protein-rich snack before bed. Examples of a protein-rick snack include cheese and crackers or a peanut butter sandwich made with 1 slice of whole-wheat bread and 1 tsp (5 g) of peanut butter. ? Eat or suck on things that have ginger in them.  It may help relieve nausea. Add  tsp ground ginger to hot tea or choose ginger tea. ? Try drinking 100% fruit juice or an electrolyte drink. An electrolyte drink contains sodium, potassium, and chloride. ? Drink fluids that are cold, clear, and carbonated or sour. Examples include lemonade, ginger ale, lemon-lime soda, ice water, and sparkling water. ? Brush your teeth or use a mouth rinse after meals. ? Talk with your health care provider about starting a supplement of vitamin B6. General instructions  Take over-the-counter and prescription medicines only as told by your health care provider.  Follow instructions from your health care provider about eating or drinking restrictions.  Continue to take your prenatal vitamins as told by your health care provider. If you are having trouble taking your prenatal vitamins, talk with your health care provider about different options.  Keep all follow-up and pre-birth (prenatal) visits as told by your health care provider. This is important. Contact a health care provider if:  You have pain in your abdomen.  You have a severe headache.  You have vision problems.  You are losing weight.  You feel weak or dizzy. Get help right away if:  You cannot drink fluids without vomiting.  You vomit blood.  You have constant nausea and vomiting.  You are very weak.  You faint.  You have a fever and your symptoms suddenly get worse. Summary  Hyperemesis gravidarum is a severe form of nausea and vomiting that happens during pregnancy.  Making some changes to your eating habits may help relieve nausea and vomiting.  This condition may be managed with medicine.  If medicines do not help relieve nausea and vomiting, you may need to receive fluids through an IV at the hospital. This information is not intended to replace advice given to you by your health care provider. Make sure you discuss any questions you have with your health care  provider. Document Released: 07/29/2005 Document Revised: 08/18/2017 Document Reviewed: 03/27/2016 Elsevier Patient Education  2020 [employer].

## 2019-08-04 DIAGNOSIS — O219 Vomiting of pregnancy, unspecified: Secondary | ICD-10-CM | POA: Diagnosis present

## 2019-08-05 NOTE — Progress Notes (Signed)
Chart reviewed for nurse visit. Agree with plan of care.   Jody Robinson, [place] 08/05/2019 6:06 PM

## 2019-08-10 ENCOUNTER — Other Ambulatory Visit: Payer: Self-pay

## 2019-08-10 ENCOUNTER — Telehealth: Payer: 59 | Admitting: Nurse Practitioner

## 2019-08-10 ENCOUNTER — Inpatient Hospital Stay (HOSPITAL_COMMUNITY)
Admission: AD | Admit: 2019-08-10 | Discharge: 2019-08-10 | Disposition: A | Discharge status: Home / Self Care | Payer: 59 | Attending: Obstetrics and Gynecology | Admitting: Obstetrics and Gynecology

## 2019-08-10 ENCOUNTER — Encounter (HOSPITAL_COMMUNITY): Payer: Self-pay | Admitting: Obstetrics and Gynecology

## 2019-08-10 DIAGNOSIS — Z833 Family history of diabetes mellitus: Secondary | ICD-10-CM | POA: Insufficient documentation

## 2019-08-10 DIAGNOSIS — Z79899 Other long term (current) drug therapy: Secondary | ICD-10-CM | POA: Insufficient documentation

## 2019-08-10 DIAGNOSIS — Z3491 Encounter for supervision of normal pregnancy, unspecified, first trimester: Secondary | ICD-10-CM

## 2019-08-10 DIAGNOSIS — Z3A09 9 weeks gestation of pregnancy: Secondary | ICD-10-CM

## 2019-08-10 DIAGNOSIS — Z5329 Procedure and treatment not carried out because of patient's decision for other reasons: Secondary | ICD-10-CM

## 2019-08-10 DIAGNOSIS — R109 Unspecified abdominal pain: Secondary | ICD-10-CM | POA: Diagnosis not present

## 2019-08-10 DIAGNOSIS — O219 Vomiting of pregnancy, unspecified: Secondary | ICD-10-CM | POA: Diagnosis present

## 2019-08-10 DIAGNOSIS — Z91199 Patient's noncompliance with other medical treatment and regimen due to unspecified reason: Secondary | ICD-10-CM

## 2019-08-10 LAB — COMPREHENSIVE METABOLIC PANEL
ALT: 19 U/L (ref 0–44)
AST: 21 U/L (ref 15–41)
Albumin: 3.3 g/dL — ABNORMAL LOW (ref 3.5–5.0)
Alkaline Phosphatase: 63 U/L (ref 38–126)
Anion gap: 16 — ABNORMAL HIGH (ref 5–15)
BUN: 11 mg/dL (ref 6–20)
CO2: 21 mmol/L — ABNORMAL LOW (ref 22–32)
Calcium: 9.1 mg/dL (ref 8.9–10.3)
Chloride: 99 mmol/L (ref 98–111)
Creatinine, Ser: 0.86 mg/dL (ref 0.44–1.00)
GFR calc Af Amer: >60 mL/min (ref >60)
GFR calc non Af Amer: >60 mL/min (ref >60)
Glucose, Bld: 96 mg/dL (ref 70–99)
Potassium: 3.3 mmol/L — ABNORMAL LOW (ref 3.5–5.1)
Sodium: 136 mmol/L (ref 135–145)
Total Bilirubin: 1.3 mg/dL — ABNORMAL HIGH (ref 0.3–1.2)
Total Protein: 7.1 g/dL (ref 6.5–8.1)

## 2019-08-10 LAB — URINALYSIS, ROUTINE W REFLEX MICROSCOPIC
Bilirubin Urine: NEGATIVE
Glucose, UA: NEGATIVE mg/dL
Ketones, ur: 80 mg/dL — AB
Nitrite: NEGATIVE
Protein, ur: 100 mg/dL — AB
Specific Gravity, Urine: 1.033 — ABNORMAL HIGH (ref 1.005–1.030)
pH: 6 (ref 5.0–8.0)

## 2019-08-10 LAB — CBC
HCT: 34.4 % — ABNORMAL LOW (ref 36.0–46.0)
Hemoglobin: 11.4 g/dL — ABNORMAL LOW (ref 12.0–15.0)
MCH: 26.1 pg (ref 26.0–34.0)
MCHC: 33.1 g/dL (ref 30.0–36.0)
MCV: 78.9 fL — ABNORMAL LOW (ref 80.0–100.0)
Platelets: 579 10*3/uL — ABNORMAL HIGH (ref 150–400)
RBC: 4.36 MIL/uL (ref 3.87–5.11)
RDW: 14.6 % (ref 11.5–15.5)
WBC: 13 10*3/uL — ABNORMAL HIGH (ref 4.0–10.5)
nRBC: 0 % (ref 0.0–0.2)

## 2019-08-10 LAB — RAPID URINE DRUG SCREEN, HOSP PERFORMED
Amphetamines: NOT DETECTED
Barbiturates: NOT DETECTED
Benzodiazepines: NOT DETECTED
Cocaine: NOT DETECTED
Opiates: NOT DETECTED
Tetrahydrocannabinol: NOT DETECTED

## 2019-08-10 MED ORDER — PROMETHAZINE HCL 25 MG PO TABS: 25.0000 mg | ORAL_TABLET | Freq: Four times a day (QID) | ORAL | 2 refills | Status: DC | PRN

## 2019-08-10 MED ORDER — DIPHENHYDRAMINE HCL 50 MG/ML IJ SOLN
25.0000 mg | Freq: Once | INTRAMUSCULAR | Status: AC
  Administered 2019-08-10: 25 mg via INTRAVENOUS
  Filled 2019-08-10: qty 1

## 2019-08-10 MED ORDER — SCOPOLAMINE 1 MG/3DAYS TD PT72
1.0000 | MEDICATED_PATCH | TRANSDERMAL | Status: DC
  Administered 2019-08-10: 1.5 mg via TRANSDERMAL
  Filled 2019-08-10: qty 1

## 2019-08-10 MED ORDER — METOCLOPRAMIDE HCL 5 MG/ML IJ SOLN
10.0000 mg | Freq: Once | INTRAMUSCULAR | Status: AC
  Administered 2019-08-10: 10 mg via INTRAVENOUS
  Filled 2019-08-10: qty 2

## 2019-08-10 MED ORDER — LACTATED RINGERS IV BOLUS
1000.0000 mL | Freq: Once | INTRAVENOUS | Status: AC
  Administered 2019-08-10: 1000 mL via INTRAVENOUS

## 2019-08-10 MED ORDER — M.V.I. ADULT IV INJ
Freq: Once | INTRAVENOUS | Status: AC
  Filled 2019-08-10: qty 10

## 2019-08-10 NOTE — Discharge Instructions (Signed)
Follow these instructions at home: Eating and drinking   Avoid the following: ? Drinking fluids with meals. Try not to drink anything during the 30 minutes before and after your meals. ? Drinking more than 1 cup of fluid at a time. ? Eating foods that trigger your symptoms. These may include spicy foods, coffee, high-fat foods, very sweet foods, and acidic foods. ? Skipping meals. Nausea can be more intense on an empty stomach. If you cannot tolerate food, do not force it. Try sucking on ice chips or other frozen items and make up for missed calories later. ? Lying down within 2 hours after eating. ? Being exposed to environmental triggers. These may include food smells, smoky rooms, closed spaces, rooms with strong smells, warm or humid places, overly loud and noisy rooms, and rooms with motion or flickering lights. Try eating meals in a well-ventilated area that is free of strong smells. ? Quick and sudden changes in your movement. ? Taking iron pills and multivitamins that contain iron. If you take prescription iron pills, do not stop taking them unless your health care provider approves. ? Preparing food. The smell of food can spoil your appetite or trigger nausea.  To help relieve your symptoms: ? Listen to your body. Everyone is different and has different preferences. Find what works best for you. ? Eat and drink slowly. ? Eat 5-6 small meals daily instead of 3 large meals. Eating small meals and snacks can help you avoid an empty stomach. ? In the morning, before getting out of bed, eat a couple of crackers to avoid moving around on an empty stomach. ? Try eating starchy foods as these are usually tolerated well. Examples include cereal, toast, bread, potatoes, pasta, rice, and pretzels. ? Include at least 1 serving of protein with your meals and snacks. Protein options include lean meats, poultry, seafood, beans, nuts, nut butters, eggs, cheese, and yogurt. ? Try eating a protein-rich  snack before bed. Examples of a protein-rick snack include cheese and crackers or a peanut butter sandwich made with 1 slice of whole-wheat bread and 1 tsp (5 g) of peanut butter. ? Eat or suck on things that have ginger in them. It may help relieve nausea. Add  tsp ground ginger to hot tea or choose ginger tea. ? Try drinking 100% fruit juice or an electrolyte drink. An electrolyte drink contains sodium, potassium, and chloride. ? Drink fluids that are cold, clear, and carbonated or sour. Examples include lemonade, ginger ale, lemon-lime soda, ice water, and sparkling water. ? Brush your teeth or use a mouth rinse after meals. ? Talk with your health care provider about starting a supplement of vitamin B6. General instructions  Take over-the-counter and prescription medicines only as told by your health care provider.  Follow instructions from your health care provider about eating or drinking restrictions.  Continue to take your prenatal vitamins as told by your health care provider. If you are having trouble taking your prenatal vitamins, talk with your health care provider about different options.  Keep all follow-up and pre-birth (prenatal) visits as told by your health care provider. This is important. Contact a health care provider if:  You have pain in your abdomen.  You have a severe headache.  You have vision problems.  You are losing weight.  You feel weak or dizzy. Get help right away if:  You cannot drink fluids without vomiting.  You vomit blood.  You have constant nausea and vomiting.  You are   very weak.  You faint.  You have a fever and your symptoms suddenly get worse. Summary  Making some changes to your eating habits may help relieve nausea and vomiting.  This condition may be managed with medicine.  If medicines do not help relieve nausea and vomiting, you may need to receive fluids through an IV at the hospital. This information is not intended to  replace advice given to you by your health care provider. Make sure you discuss any questions you have with your health care provider. Document Released: 07/29/2005 Document Revised: 08/18/2017 Document Reviewed: 03/27/2016 Elsevier Patient Education  2020 [employer].

## 2019-08-10 NOTE — Progress Notes (Signed)
Pateint connected to video states she [facility] and being seen, will have her rescheduled.

## 2019-08-10 NOTE — MAU Note (Signed)
Presents with c/o N&V, reports unable to keep anything down.  States unable to eat, not hungry.  Rx for phenergan, but wasn't @ pharmacy.  Also c/o aching in left mid abdomen.  No VB.

## 2019-08-10 NOTE — MAU Provider Note (Signed)
History     CSN: [id number]  Arrival date and time: 08/10/19 1424   First Provider Initiated Contact with Patient 08/10/19 1536      Chief Complaint  Patient presents with  . Emesis  . Nausea   HPI Jody Robinson is a 28 y.o. G3P1011 at [redacted]w[redacted]d who presents to MAU for evaluation of recurrent nausea and vomiting. She estimates she has vomited fifteen times today. She is unable to tolerate anything PO and is unsure when she last attempted solid food. She denies vaginal bleeding, abdominal pain, dysuria, fever or recent illness.  Patient states she was prescribed Phenergan during a previous MAU visit but she was told it was unavailable when she attempted to pick it up from the pharmacy. She has not taken other medication or tried other treatments for nausea or vomiting today.  Patient's OB history includes early pregnancy loss, hyponatremia and antepartum admission for same in June 2020.  OB History    Gravida  3   Para  1   Term  1   Preterm      AB  1   Living  1     SAB  1   TAB      Ectopic      Multiple      Live Births  1           Past Medical History:  Diagnosis Date  . Depression   . Headache   . Vaginal Pap smear, abnormal     Past Surgical History:  Procedure Laterality Date  . COLPOSCOPY      Family History  Problem Relation Age of Onset  . Diabetes Mother     Social History   Tobacco Use  . Smoking status: Never Smoker  . Smokeless tobacco: Never Used  Substance Use Topics  . Alcohol use: Not Currently    Comment: occasional  . Drug use: No    Allergies: No Known Allergies  Medications Prior to Admission  Medication Sig Dispense Refill Last Dose  . butalbital-acetaminophen-caffeine (FIORICET) 50-325-40 MG tablet Take 1-2 tablets by mouth every 6 (six) hours as needed for headache. 20 tablet 0  at not taking  . cyclobenzaprine (FLEXERIL) 5 MG tablet Take 1 tablet (5 mg total) by mouth 3 (three) times daily as needed for  muscle spasms. 20 tablet 0  at not taking  . docusate sodium (COLACE) 100 MG capsule Take 100 mg by mouth 2 (two) times daily as needed for mild constipation.     . metoCLOPramide (REGLAN) 10 MG tablet Take 1 tablet (10 mg total) by mouth every 8 (eight) hours as needed for nausea. 30 tablet 0  at not taking  . Prenatal MV-Min-FA-Omega-3 (PRENATAL GUMMIES/DHA & FA PO) Take 2 tablets by mouth daily.     . [DISCONTINUED] promethazine (PHENERGAN) 25 MG tablet Take 1 tablet (25 mg total) by mouth every 6 (six) hours as needed for nausea or vomiting. 30 tablet 2  at not taking    Review of Systems  Constitutional: Positive for fatigue. Negative for chills and fever.  Gastrointestinal: Positive for nausea and vomiting.  Genitourinary: Negative for difficulty urinating, dysuria, flank pain and vaginal bleeding.  Musculoskeletal: Negative for back pain.  All other systems reviewed and are negative.  Physical Exam   Blood pressure (!) 113/59, pulse (!) 57, temperature 98.3 F (36.8 C), temperature source Oral, resp. rate 20, height 5' (1.524 m), weight 63.1 kg, last menstrual period 06/02/2019, SpO2 100 %.  Physical Exam  Nursing note and vitals reviewed. Constitutional: She is oriented to person, place, and time. She appears well-developed and well-nourished.  Cardiovascular: Normal rate.  Respiratory: Effort normal and breath sounds normal. She has no decreased breath sounds.  GI: Soft. Bowel sounds are normal. She exhibits no distension. There is no abdominal tenderness. There is no rebound and no guarding.  Neurological: She is oriented to person, place, and time.  Skin: Skin is warm and dry.  Psychiatric: She has a normal mood and affect. Her behavior is normal. Judgment and thought content normal.    MAU Course/MDM  Procedures   --Small weight loss 66kg on 12/14, today 63.1kg --Small improvement in patient status following Zofran ODT, Reglan and Benadryl.  --Patient tolerating PO  prior to discharge. Phenergan prescription submitted to different pharmacy, patient states her sister can provide assistance with obtaining her medication. Emphasized to patient that pharmacy can call MAU if hey experience any difficulty filling prescription. Patient can message me via MyChart for any problems picking up Phenergan. --Lab results reviewed with Dr. Kennon Rounds, who agrees with my plan of care.  Patient Vitals for the past 24 hrs:  BP Temp Temp src Pulse Resp SpO2 Height Weight  08/10/19 1859 (!) 113/59 -- -- -- -- -- -- --  08/10/19 1447 113/61 98.3 F (36.8 C) Oral (!) 57 20 100 % -- --  08/10/19 1446 -- -- -- -- -- -- 5' (1.524 m) 63.1 kg   Results for orders placed or performed during the hospital encounter of 08/10/19 (from the past 24 hour(s))  Urinalysis, Routine w reflex microscopic     Status: Abnormal   Collection Time: 08/10/19  3:28 PM  Result Value Ref Range   Color, Urine AMBER (A) YELLOW   APPearance HAZY (A) CLEAR   Specific Gravity, Urine 1.033 (H) 1.005 - 1.030   pH 6.0 5.0 - 8.0   Glucose, UA NEGATIVE NEGATIVE mg/dL   Hgb urine dipstick MODERATE (A) NEGATIVE   Bilirubin Urine NEGATIVE NEGATIVE   Ketones, ur 80 (A) NEGATIVE mg/dL   Protein, ur 100 (A) NEGATIVE mg/dL   Nitrite NEGATIVE NEGATIVE   Leukocytes,Ua TRACE (A) NEGATIVE   RBC / HPF 21-50 0 - 5 RBC/hpf   WBC, UA 6-10 0 - 5 WBC/hpf   Bacteria, UA RARE (A) NONE SEEN   Squamous Epithelial / LPF 6-10 0 - 5   Mucus PRESENT   CBC     Status: Abnormal   Collection Time: 08/10/19  4:48 PM  Result Value Ref Range   WBC 13.0 (H) 4.0 - 10.5 K/uL   RBC 4.36 3.87 - 5.11 MIL/uL   Hemoglobin 11.4 (L) 12.0 - 15.0 g/dL   HCT 34.4 (L) 36.0 - 46.0 %   MCV 78.9 (L) 80.0 - 100.0 fL   MCH 26.1 26.0 - 34.0 pg   MCHC 33.1 30.0 - 36.0 g/dL   RDW 14.6 11.5 - 15.5 %   Platelets 579 (H) 150 - 400 K/uL   nRBC 0.0 0.0 - 0.2 %  Comprehensive metabolic panel     Status: Abnormal   Collection Time: 08/10/19  4:48 PM   Result Value Ref Range   Sodium 136 135 - 145 mmol/L   Potassium 3.3 (L) 3.5 - 5.1 mmol/L   Chloride 99 98 - 111 mmol/L   CO2 21 (L) 22 - 32 mmol/L   Glucose, Bld 96 70 - 99 mg/dL   BUN 11 6 - 20 mg/dL   Creatinine, Ser  0.86 0.44 - 1.00 mg/dL   Calcium 9.1 8.9 - 10.3 mg/dL   Total Protein 7.1 6.5 - 8.1 g/dL   Albumin 3.3 (L) 3.5 - 5.0 g/dL   AST 21 15 - 41 U/L   ALT 19 0 - 44 U/L   Alkaline Phosphatase 63 38 - 126 U/L   Total Bilirubin 1.3 (H) 0.3 - 1.2 mg/dL   GFR calc non Af Amer >60 >60 mL/min   GFR calc Af Amer >60 >60 mL/min   Anion gap 16 (H) 5 - 15  Urine rapid drug screen     Status: None   Collection Time: 08/10/19  7:00 PM  Result Value Ref Range   Opiates NONE DETECTED NONE DETECTED   Cocaine NONE DETECTED NONE DETECTED   Benzodiazepines NONE DETECTED NONE DETECTED   Amphetamines NONE DETECTED NONE DETECTED   Tetrahydrocannabinol NONE DETECTED NONE DETECTED   Barbiturates NONE DETECTED NONE DETECTED   Meds ordered this encounter  Medications  . metoCLOPramide (REGLAN) injection 10 mg  . diphenhydrAMINE (BENADRYL) injection 25 mg  . lactated ringers bolus 1,000 mL  . multivitamins adult (INFUVITE ADULT) 10 mL in lactated ringers 1,000 mL infusion  . scopolamine (TRANSDERM-SCOP) 1 MG/3DAYS 1.5 mg  . promethazine (PHENERGAN) 25 MG tablet    Sig: Take 1 tablet (25 mg total) by mouth every 6 (six) hours as needed for nausea or vomiting.    Dispense:  30 tablet    Refill:  2    Order Specific Question:   Supervising Provider    Answer:   [name] [id number]   Assessment and Plan  --28 y.o. G3P1011 at [redacted]w[redacted]d  --Ketonuria --Tolerating PO prior to discharge --Discharge home in stable condition  Darlina Rumpf, [place] 08/10/2019, 8:08 PM

## 2019-08-13 NOTE — L&D Delivery Note (Addendum)
Patient: Jody Robinson MRN: [id number]  GBS status: Positive, IAP given  Patient is a 29 y.o. now [id number] s/p NSVD at [redacted]w[redacted]d, who was admitted for SOL. SROM 5h 28m prior to delivery with clear fluid.  Initial SVE was 5/90/-2. Patient was expectantly managed and had SROM at 1600. Patient ultimately progressed to 10/100/+2 at 2054.  Delivery Note After progressing to complete, patient began pushing. Head delivered ROA. No nuchal cord present. Shoulder and body delivered in usual fashion. Infant with spontaneous cry, placed on mother's abdomen, dried and bulb suctioned. Cord clamped x 2 after 1-minute delay, and cut by family member. Cord blood drawn. Placenta delivered spontaneously with gentle cord traction. Fundus firm with massage and Pitocin. Perineum inspected and found to be intact.  At 9:09 PM a viable female was delivered via Vaginal, Spontaneous (Presentation: Right Occiput Anterior).  APGAR: 8, 9; weight : 3810g Placenta status: Spontaneous, Intact.  Cord: 3 vessels with the following complications: None.  Anesthesia: Epidural Episiotomy: None Lacerations: None Est. Blood Loss (mL): 200 cc  Mom to postpartum.  Baby to Couplet care / Skin to Skin.  Genia Del, MD Family Medicine PGY-3 03/01/2020, 9:19 PM  OB FELLOW DELIVERY ATTESTATION  I was gloved and present for the delivery in its entirety, and I agree with the above resident's note.    Barrington Ellison, MD [facility] Family Medicine Fellow, [facility] for [employer], [place]

## 2019-08-16 ENCOUNTER — Inpatient Hospital Stay (HOSPITAL_COMMUNITY): Payer: 59

## 2019-08-16 ENCOUNTER — Encounter (HOSPITAL_COMMUNITY): Payer: Self-pay | Admitting: Family Medicine

## 2019-08-16 ENCOUNTER — Inpatient Hospital Stay (HOSPITAL_COMMUNITY)
Admission: AD | Admit: 2019-08-16 | Discharge: 2019-08-16 | Disposition: A | Discharge status: Home / Self Care | Payer: 59 | Attending: Family Medicine | Admitting: Family Medicine

## 2019-08-16 DIAGNOSIS — O99281 Endocrine, nutritional and metabolic diseases complicating pregnancy, first trimester: Secondary | ICD-10-CM | POA: Diagnosis not present

## 2019-08-16 DIAGNOSIS — E876 Hypokalemia: Secondary | ICD-10-CM | POA: Diagnosis not present

## 2019-08-16 DIAGNOSIS — D75839 Thrombocytosis, unspecified: Secondary | ICD-10-CM

## 2019-08-16 DIAGNOSIS — R109 Unspecified abdominal pain: Secondary | ICD-10-CM | POA: Diagnosis not present

## 2019-08-16 DIAGNOSIS — D473 Essential (hemorrhagic) thrombocythemia: Secondary | ICD-10-CM

## 2019-08-16 DIAGNOSIS — Z3A10 10 weeks gestation of pregnancy: Secondary | ICD-10-CM | POA: Insufficient documentation

## 2019-08-16 DIAGNOSIS — R7989 Other specified abnormal findings of blood chemistry: Secondary | ICD-10-CM | POA: Diagnosis not present

## 2019-08-16 DIAGNOSIS — O210 Mild hyperemesis gravidarum: Secondary | ICD-10-CM | POA: Diagnosis not present

## 2019-08-16 DIAGNOSIS — Z833 Family history of diabetes mellitus: Secondary | ICD-10-CM | POA: Insufficient documentation

## 2019-08-16 DIAGNOSIS — R112 Nausea with vomiting, unspecified: Secondary | ICD-10-CM | POA: Diagnosis present

## 2019-08-16 DIAGNOSIS — O26899 Other specified pregnancy related conditions, unspecified trimester: Secondary | ICD-10-CM

## 2019-08-16 DIAGNOSIS — O26891 Other specified pregnancy related conditions, first trimester: Secondary | ICD-10-CM | POA: Insufficient documentation

## 2019-08-16 HISTORY — DX: Thrombocytosis, unspecified: D75.839

## 2019-08-16 LAB — URINALYSIS, ROUTINE W REFLEX MICROSCOPIC
Glucose, UA: NEGATIVE mg/dL
Ketones, ur: 80 mg/dL — AB
Leukocytes,Ua: NEGATIVE
Nitrite: NEGATIVE
Protein, ur: 100 mg/dL — AB
Specific Gravity, Urine: 1.03 (ref 1.005–1.030)
pH: 6 (ref 5.0–8.0)

## 2019-08-16 LAB — CBC WITH DIFFERENTIAL/PLATELET
Abs Immature Granulocytes: 0.05 10*3/uL (ref 0.00–0.07)
Basophils Absolute: 0 10*3/uL (ref 0.0–0.1)
Basophils Relative: 0 %
Eosinophils Absolute: 0 10*3/uL (ref 0.0–0.5)
Eosinophils Relative: 0 %
HCT: 38.8 % (ref 36.0–46.0)
Hemoglobin: 13.2 g/dL (ref 12.0–15.0)
Immature Granulocytes: 0 %
Lymphocytes Relative: 12 %
Lymphs Abs: 1.5 10*3/uL (ref 0.7–4.0)
MCH: 26.3 pg (ref 26.0–34.0)
MCHC: 34 g/dL (ref 30.0–36.0)
MCV: 77.3 fL — ABNORMAL LOW (ref 80.0–100.0)
Monocytes Absolute: 0.9 10*3/uL (ref 0.1–1.0)
Monocytes Relative: 7 %
Neutro Abs: 10.4 10*3/uL — ABNORMAL HIGH (ref 1.7–7.7)
Neutrophils Relative %: 81 %
Platelets: 661 10*3/uL — ABNORMAL HIGH (ref 150–400)
RBC: 5.02 MIL/uL (ref 3.87–5.11)
RDW: 14.4 % (ref 11.5–15.5)
WBC: 12.9 10*3/uL — ABNORMAL HIGH (ref 4.0–10.5)
nRBC: 0 % (ref 0.0–0.2)

## 2019-08-16 LAB — RAPID URINE DRUG SCREEN, HOSP PERFORMED
Amphetamines: NOT DETECTED
Barbiturates: NOT DETECTED
Benzodiazepines: NOT DETECTED
Cocaine: NOT DETECTED
Opiates: NOT DETECTED
Tetrahydrocannabinol: NOT DETECTED

## 2019-08-16 LAB — COMPREHENSIVE METABOLIC PANEL
ALT: 160 U/L — ABNORMAL HIGH (ref 0–44)
AST: 100 U/L — ABNORMAL HIGH (ref 15–41)
Albumin: 3.3 g/dL — ABNORMAL LOW (ref 3.5–5.0)
Alkaline Phosphatase: 92 U/L (ref 38–126)
Anion gap: 14 (ref 5–15)
BUN: 12 mg/dL (ref 6–20)
CO2: 21 mmol/L — ABNORMAL LOW (ref 22–32)
Calcium: 9.2 mg/dL (ref 8.9–10.3)
Chloride: 98 mmol/L (ref 98–111)
Creatinine, Ser: 0.92 mg/dL (ref 0.44–1.00)
GFR calc Af Amer: >60 mL/min (ref >60)
GFR calc non Af Amer: >60 mL/min (ref >60)
Glucose, Bld: 98 mg/dL (ref 70–99)
Potassium: 2.9 mmol/L — ABNORMAL LOW (ref 3.5–5.1)
Sodium: 133 mmol/L — ABNORMAL LOW (ref 135–145)
Total Bilirubin: 1.8 mg/dL — ABNORMAL HIGH (ref 0.3–1.2)
Total Protein: 7.6 g/dL (ref 6.5–8.1)

## 2019-08-16 LAB — MAGNESIUM: Magnesium: 2.1 mg/dL (ref 1.7–2.4)

## 2019-08-16 MED ORDER — SCOPOLAMINE 1 MG/3DAYS TD PT72: 1.0000 | MEDICATED_PATCH | TRANSDERMAL | 12 refills | Status: DC

## 2019-08-16 MED ORDER — LACTATED RINGERS IV SOLN: INTRAVENOUS | Status: DC

## 2019-08-16 MED ORDER — PANTOPRAZOLE SODIUM 20 MG PO TBEC: 20.0000 mg | DELAYED_RELEASE_TABLET | Freq: Every day | ORAL | 1 refills | Status: DC

## 2019-08-16 MED ORDER — POTASSIUM CHLORIDE 10 MEQ/100ML IV SOLN
10.0000 meq | INTRAVENOUS | Status: AC
  Administered 2019-08-16 (×2): 10 meq via INTRAVENOUS
  Filled 2019-08-16 (×3): qty 100

## 2019-08-16 MED ORDER — SODIUM CHLORIDE 0.9 % IV SOLN
8.0000 mg | Freq: Once | INTRAVENOUS | Status: AC
  Administered 2019-08-16: 8 mg via INTRAVENOUS
  Filled 2019-08-16: qty 4

## 2019-08-16 MED ORDER — ONDANSETRON 8 MG PO TBDP: 8.0000 mg | ORAL_TABLET | Freq: Three times a day (TID) | ORAL | 2 refills | Status: AC | PRN

## 2019-08-16 MED ORDER — PANTOPRAZOLE SODIUM 40 MG IV SOLR
40.0000 mg | Freq: Once | INTRAVENOUS | Status: AC
  Administered 2019-08-16: 40 mg via INTRAVENOUS
  Filled 2019-08-16: qty 40

## 2019-08-16 MED ORDER — SCOPOLAMINE 1 MG/3DAYS TD PT72
1.0000 | MEDICATED_PATCH | TRANSDERMAL | Status: DC
  Administered 2019-08-16: 1.5 mg via TRANSDERMAL
  Filled 2019-08-16: qty 1

## 2019-08-16 MED ORDER — LACTATED RINGERS IV BOLUS
1000.0000 mL | Freq: Once | INTRAVENOUS | Status: AC
  Administered 2019-08-16: 1000 mL via INTRAVENOUS

## 2019-08-16 MED ORDER — ENSURE ENLIVE PO LIQD: 237.0000 mL | Freq: Two times a day (BID) | ORAL | 14 refills | Status: DC

## 2019-08-16 NOTE — Discharge Instructions (Signed)
Safe Medications in Pregnancy    Acne: Benzoyl Peroxide Salicylic Acid  Backache/Headache: Tylenol: 2 regular strength every 4 hours OR              2 Extra strength every 6 hours  Colds/Coughs/Allergies: Benadryl (alcohol free) 25 mg every 6 hours as needed Breath right strips Claritin Cepacol throat lozenges Chloraseptic throat spray Cold-Eeze- up to three times per day Cough drops, alcohol free Flonase (by prescription only) Guaifenesin Mucinex Robitussin DM (plain only, alcohol free) Saline nasal spray/drops Sudafed (pseudoephedrine) & Actifed ** use only after [redacted] weeks gestation and if you do not have high blood pressure Tylenol Vicks Vaporub Zinc lozenges Zyrtec   Constipation: Colace Ducolax suppositories Fleet enema Glycerin suppositories Metamucil Milk of magnesia Miralax Senokot Smooth move tea  Diarrhea: Kaopectate Imodium A-D  *NO pepto Bismol  Hemorrhoids: Anusol Anusol HC Preparation H Tucks  Indigestion: Tums Maalox Mylanta Zantac  Pepcid  Insomnia: Benadryl (alcohol free) 25mg  every 6 hours as needed Tylenol PM Unisom, no Gelcaps  Leg Cramps: Tums MagGel  Nausea/Vomiting:  Bonine Dramamine Emetrol Ginger extract Sea bands Meclizine  Nausea medication to take during pregnancy:  Unisom (doxylamine succinate 25 mg tablets) Take one tablet daily at bedtime. If symptoms are not adequately controlled, the dose can be increased to a maximum recommended dose of two tablets daily (1/2 tablet in the morning, 1/2 tablet mid-afternoon and one at bedtime). Vitamin B6 100mg  tablets. Take one tablet twice a day (up to 200 mg per day).  Skin Rashes: Aveeno products Benadryl cream or 25mg  every 6 hours as needed Calamine Lotion 1% cortisone cream  Yeast infection: Gyne-lotrimin 7 Monistat 7   **If taking multiple medications, please check labels to avoid duplicating the same active ingredients **take  medication as directed on the label ** Do not exceed 4000 mg of tylenol in 24 hours **Do not take medications that contain aspirin or ibuprofen      Abdominal Pain During Pregnancy  Abdominal pain is common during pregnancy, and has many possible causes. Some causes are more serious than others, and sometimes the cause is not known. Abdominal pain can be a sign that labor is starting. It can also be caused by normal growth and stretching of muscles and ligaments during pregnancy. Always tell your health care provider if you have any abdominal pain. Follow these instructions at home:  Do not have sex or put anything in your vagina until your pain goes away completely.  Get plenty of rest until your pain improves.  Drink enough fluid to keep your urine pale yellow.  Take over-the-counter and prescription medicines only as told by your health care provider.  Keep all follow-up visits as told by your health care provider. This is important. Contact a health care provider if:  Your pain continues or gets worse after resting.  You have lower abdominal pain that: ? Comes and goes at regular intervals. ? Spreads to your back. ? Is similar to menstrual cramps.  You have pain or burning when you urinate. Get help right away if:  You have a fever or chills.  You have vaginal bleeding.  You are leaking fluid from your vagina.  You are passing tissue from your vagina.  You have vomiting or diarrhea that lasts for more than 24 hours.  Your baby is moving less than usual.  You feel very weak or faint.  You have shortness of breath.  You develop severe pain in your upper abdomen. Summary  Abdominal  pain is common during pregnancy, and has many possible causes.  If you experience abdominal pain during pregnancy, tell your health care provider right away.  Follow your health care provider's home care instructions and keep all follow-up visits as directed. This information is  not intended to replace advice given to you by your health care provider. Make sure you discuss any questions you have with your health care provider. Document Revised: 11/16/2018 Document Reviewed: 10/31/2016 Elsevier Patient Education  2020 [place].  Hyperemesis Gravidarum Hyperemesis gravidarum is a severe form of nausea and vomiting that happens during pregnancy. Hyperemesis is worse than morning sickness. It may cause you to have nausea or vomiting all day for many days. It may keep you from eating and drinking enough food and liquids, which can lead to dehydration, malnutrition, and weight loss. Hyperemesis usually occurs during the first half (the first 20 weeks) of pregnancy. It often goes away once a woman is in her second half of pregnancy. However, sometimes hyperemesis continues through an entire pregnancy. What are the causes? The cause of this condition is not known. It may be related to changes in chemicals (hormones) in the body during pregnancy, such as the high level of pregnancy hormone (human chorionic gonadotropin) or the increase in the female sex hormone (estrogen). What are the signs or symptoms? Symptoms of this condition include:  Nausea that does not go away.  Vomiting that does not allow you to keep any food down.  Weight loss.  Body fluid loss (dehydration).  Having no desire to eat, or not liking food that you have previously enjoyed. How is this diagnosed? This condition may be diagnosed based on:  A physical exam.  Your medical history.  Your symptoms.  Blood tests.  Urine tests. How is this treated? This condition is managed by controlling symptoms. This may include:  Following an eating plan. This can help lessen nausea and vomiting.  Taking prescription medicines. An eating plan and medicines are often used together to help control symptoms. If medicines do not help relieve nausea and vomiting, you may need to receive fluids through an IV  at the hospital. Follow these instructions at home: Eating and drinking   Avoid the following: ? Drinking fluids with meals. Try not to drink anything during the 30 minutes before and after your meals. ? Drinking more than 1 cup of fluid at a time. ? Eating foods that trigger your symptoms. These may include spicy foods, coffee, high-fat foods, very sweet foods, and acidic foods. ? Skipping meals. Nausea can be more intense on an empty stomach. If you cannot tolerate food, do not force it. Try sucking on ice chips or other frozen items and make up for missed calories later. ? Lying down within 2 hours after eating. ? Being exposed to environmental triggers. These may include food smells, smoky rooms, closed spaces, rooms with strong smells, warm or humid places, overly loud and noisy rooms, and rooms with motion or flickering lights. Try eating meals in a well-ventilated area that is free of strong smells. ? Quick and sudden changes in your movement. ? Taking iron pills and multivitamins that contain iron. If you take prescription iron pills, do not stop taking them unless your health care provider approves. ? Preparing food. The smell of food can spoil your appetite or trigger nausea.  To help relieve your symptoms: ? Listen to your body. Everyone is different and has different preferences. Find what works best for you. ? Eat  and drink slowly. ? Eat 5-6 small meals daily instead of 3 large meals. Eating small meals and snacks can help you avoid an empty stomach. ? In the morning, before getting out of bed, eat a couple of crackers to avoid moving around on an empty stomach. ? Try eating starchy foods as these are usually tolerated well. Examples include cereal, toast, bread, potatoes, pasta, rice, and pretzels. ? Include at least 1 serving of protein with your meals and snacks. Protein options include lean meats, poultry, seafood, beans, nuts, nut butters, eggs, cheese, and yogurt. ? Try  eating a protein-rich snack before bed. Examples of a protein-rick snack include cheese and crackers or a peanut butter sandwich made with 1 slice of whole-wheat bread and 1 tsp (5 g) of peanut butter. ? Eat or suck on things that have ginger in them. It may help relieve nausea. Add  tsp ground ginger to hot tea or choose ginger tea. ? Try drinking 100% fruit juice or an electrolyte drink. An electrolyte drink contains sodium, potassium, and chloride. ? Drink fluids that are cold, clear, and carbonated or sour. Examples include lemonade, ginger ale, lemon-lime soda, ice water, and sparkling water. ? Brush your teeth or use a mouth rinse after meals. ? Talk with your health care provider about starting a supplement of vitamin B6. General instructions  Take over-the-counter and prescription medicines only as told by your health care provider.  Follow instructions from your health care provider about eating or drinking restrictions.  Continue to take your prenatal vitamins as told by your health care provider. If you are having trouble taking your prenatal vitamins, talk with your health care provider about different options.  Keep all follow-up and pre-birth (prenatal) visits as told by your health care provider. This is important. Contact a health care provider if:  You have pain in your abdomen.  You have a severe headache.  You have vision problems.  You are losing weight.  You feel weak or dizzy. Get help right away if:  You cannot drink fluids without vomiting.  You vomit blood.  You have constant nausea and vomiting.  You are very weak.  You faint.  You have a fever and your symptoms suddenly get worse. Summary  Hyperemesis gravidarum is a severe form of nausea and vomiting that happens during pregnancy.  Making some changes to your eating habits may help relieve nausea and vomiting.  This condition may be managed with medicine.  If medicines do not help relieve  nausea and vomiting, you may need to receive fluids through an IV at the hospital. This information is not intended to replace advice given to you by your health care provider. Make sure you discuss any questions you have with your health care provider. Document Revised: 08/18/2017 Document Reviewed: 03/27/2016 Elsevier Patient Education  [place].  Hypokalemia Hypokalemia means that the amount of potassium in the blood is lower than normal. Potassium is a chemical (electrolyte) that helps regulate the amount of fluid in the body. It also stimulates muscle tightening (contraction) and helps nerves work properly. Normally, most of the body's potassium is inside cells, and only a very small amount is in the blood. Because the amount in the blood is so small, minor changes to potassium levels in the blood can be life-threatening. What are the causes? This condition may be caused by:  Antibiotic medicine.  Diarrhea or vomiting. Taking too much of a medicine that helps you have a bowel movement (laxative)  can cause diarrhea and lead to hypokalemia.  Chronic kidney disease (CKD).  Medicines that help the body get rid of excess fluid (diuretics).  Eating disorders, such as bulimia.  Low magnesium levels in the body.  Sweating a lot. What are the signs or symptoms? Symptoms of this condition include:  Weakness.  Constipation.  Fatigue.  Muscle cramps.  Mental confusion.  Skipped heartbeats or irregular heartbeat (palpitations).  Tingling or numbness. How is this diagnosed? This condition is diagnosed with a blood test. How is this treated? This condition may be treated by:  Taking potassium supplements by mouth.  Adjusting the medicines that you take.  Eating more foods that contain a lot of potassium. If your potassium level is very low, you may need to get potassium through an IV and be monitored in the hospital. Follow these instructions at home:   Take  over-the-counter and prescription medicines only as told by your health care provider. This includes vitamins and supplements.  Eat a healthy diet. A healthy diet includes fresh fruits and vegetables, whole grains, healthy fats, and lean proteins.  If instructed, eat more foods that contain a lot of potassium. This includes: ? Nuts, such as peanuts and pistachios. ? Seeds, such as sunflower seeds and pumpkin seeds. ? Peas, lentils, and lima beans. ? Whole grain and bran cereals and breads. ? Fresh fruits and vegetables, such as apricots, avocado, bananas, cantaloupe, kiwi, oranges, tomatoes, asparagus, and potatoes. ? Orange juice. ? Tomato juice. ? Red meats. ? Yogurt.  Keep all follow-up visits as told by your health care provider. This is important. Contact a health care provider if you:  Have weakness that gets worse.  Feel your heart pounding or racing.  Vomit.  Have diarrhea.  Have diabetes (diabetes mellitus) and you have trouble keeping your blood sugar (glucose) in your target range. Get help right away if you:  Have chest pain.  Have shortness of breath.  Have vomiting or diarrhea that lasts for more than 2 days.  Faint. Summary  Hypokalemia means that the amount of potassium in the blood is lower than normal.  This condition is diagnosed with a blood test.  Hypokalemia may be treated by taking potassium supplements, adjusting the medicines that you take, or eating more foods that are high in potassium.  If your potassium level is very low, you may need to get potassium through an IV and be monitored in the hospital. This information is not intended to replace advice given to you by your health care provider. Make sure you discuss any questions you have with your health care provider. Document Revised: 03/11/2018 Document Reviewed: 03/11/2018 Elsevier Patient Education  [place] of Pregnancy The first trimester of pregnancy is  from week 1 until the end of week 13 (months 1 through 3). A week after a sperm fertilizes an egg, the egg will implant on the wall of the uterus. This embryo will begin to develop into a baby. Genes from you and your partner will form the baby. The female genes will determine whether the baby will be a boy or a girl. At 6-8 weeks, the eyes and face will be formed, and the heartbeat can be seen on ultrasound. At the end of 12 weeks, all the baby's organs will be formed. Now that you are pregnant, you will want to do everything you can to have a healthy baby. Two of the most important things are to get good prenatal care and  to follow your health care provider's instructions. Prenatal care is all the medical care you receive before the baby's birth. This care will help prevent, find, and treat any problems during the pregnancy and childbirth. Body changes during your first trimester Your body goes through many changes during pregnancy. The changes vary from woman to woman.  You may gain or lose a couple of pounds at first.  You may feel sick to your stomach (nauseous) and you may throw up (vomit). If the vomiting is uncontrollable, call your health care provider.  You may tire easily.  You may develop headaches that can be relieved by medicines. All medicines should be approved by your health care provider.  You may urinate more often. Painful urination may mean you have a bladder infection.  You may develop heartburn as a result of your pregnancy.  You may develop constipation because certain hormones are causing the muscles that push stool through your intestines to slow down.  You may develop hemorrhoids or swollen veins (varicose veins).  Your breasts may begin to grow larger and become tender. Your nipples may stick out more, and the tissue that surrounds them (areola) may become darker.  Your gums may bleed and may be sensitive to brushing and flossing.  Dark spots or blotches (chloasma,  mask of pregnancy) may develop on your face. This will likely fade after the baby is born.  Your menstrual periods will stop.  You may have a loss of appetite.  You may develop cravings for certain kinds of food.  You may have changes in your emotions from day to day, such as being excited to be pregnant or being concerned that something may go wrong with the pregnancy and baby.  You may have more vivid and strange dreams.  You may have changes in your hair. These can include thickening of your hair, rapid growth, and changes in texture. Some women also have hair loss during or after pregnancy, or hair that feels dry or thin. Your hair will most likely return to normal after your baby is born. What to expect at prenatal visits During a routine prenatal visit:  You will be weighed to make sure you and the baby are growing normally.  Your blood pressure will be taken.  Your abdomen will be measured to track your baby's growth.  The fetal heartbeat will be listened to between weeks 10 and 14 of your pregnancy.  Test results from any previous visits will be discussed. Your health care provider may ask you:  How you are feeling.  If you are feeling the baby move.  If you have had any abnormal symptoms, such as leaking fluid, bleeding, severe headaches, or abdominal cramping.  If you are using any tobacco products, including cigarettes, chewing tobacco, and electronic cigarettes.  If you have any questions. Other tests that may be performed during your first trimester include:  Blood tests to find your blood type and to check for the presence of any previous infections. The tests will also be used to check for low iron levels (anemia) and protein on red blood cells (Rh antibodies). Depending on your risk factors, or if you previously had diabetes during pregnancy, you may have tests to check for high blood sugar that affects pregnant women (gestational diabetes).  Urine tests to  check for infections, diabetes, or protein in the urine.  An ultrasound to confirm the proper growth and development of the baby.  Fetal screens for spinal cord problems (spina bifida)  and Down syndrome.  HIV (human immunodeficiency virus) testing. Routine prenatal testing includes screening for HIV, unless you choose not to have this test.  You may need other tests to make sure you and the baby are doing well. Follow these instructions at home: Medicines  Follow your health care provider's instructions regarding medicine use. Specific medicines may be either safe or unsafe to take during pregnancy.  Take a prenatal vitamin that contains at least 600 micrograms (mcg) of folic acid.  If you develop constipation, try taking a stool softener if your health care provider approves. Eating and drinking   Eat a balanced diet that includes fresh fruits and vegetables, whole grains, good sources of protein such as meat, eggs, or tofu, and low-fat dairy. Your health care provider will help you determine the amount of weight gain that is right for you.  Avoid raw meat and uncooked cheese. These carry germs that can cause birth defects in the baby.  Eating four or five small meals rather than three large meals a day may help relieve nausea and vomiting. If you start to feel nauseous, eating a few soda crackers can be helpful. Drinking liquids between meals, instead of during meals, also seems to help ease nausea and vomiting.  Limit foods that are high in fat and processed sugars, such as fried and sweet foods.  To prevent constipation: ? Eat foods that are high in fiber, such as fresh fruits and vegetables, whole grains, and beans. ? Drink enough fluid to keep your urine clear or pale yellow. Activity  Exercise only as directed by your health care provider. Most women can continue their usual exercise routine during pregnancy. Try to exercise for 30 minutes at least 5 days a week. Exercising  will help you: ? Control your weight. ? Stay in shape. ? Be prepared for labor and delivery.  Experiencing pain or cramping in the lower abdomen or lower back is a good sign that you should stop exercising. Check with your health care provider before continuing with normal exercises.  Try to avoid standing for long periods of time. Move your legs often if you must stand in one place for a long time.  Avoid heavy lifting.  Wear low-heeled shoes and practice good posture.  You may continue to have sex unless your health care provider tells you not to. Relieving pain and discomfort  Wear a good support bra to relieve breast tenderness.  Take warm sitz baths to soothe any pain or discomfort caused by hemorrhoids. Use hemorrhoid cream if your health care provider approves.  Rest with your legs elevated if you have leg cramps or low back pain.  If you develop varicose veins in your legs, wear support hose. Elevate your feet for 15 minutes, 3-4 times a day. Limit salt in your diet. Prenatal care  Schedule your prenatal visits by the twelfth week of pregnancy. They are usually scheduled monthly at first, then more often in the last 2 months before delivery.  Write down your questions. Take them to your prenatal visits.  Keep all your prenatal visits as told by your health care provider. This is important. Safety  Wear your seat belt at all times when driving.  Make a list of emergency phone numbers, including numbers for family, friends, the hospital, and police and fire departments. General instructions  Ask your health care provider for a referral to a local prenatal education class. Begin classes no later than the beginning of month 6 of your  pregnancy.  Ask for help if you have counseling or nutritional needs during pregnancy. Your health care provider can offer advice or refer you to specialists for help with various needs.  Do not use hot tubs, steam rooms, or saunas.  Do not  douche or use tampons or scented sanitary pads.  Do not cross your legs for long periods of time.  Avoid cat litter boxes and soil used by cats. These carry germs that can cause birth defects in the baby and possibly loss of the fetus by miscarriage or stillbirth.  Avoid all smoking, herbs, alcohol, and medicines not prescribed by your health care provider. Chemicals in these products affect the formation and growth of the baby.  Do not use any products that contain nicotine or tobacco, such as cigarettes and e-cigarettes. If you need help quitting, ask your health care provider. You may receive counseling support and other resources to help you quit.  Schedule a dentist appointment. At home, brush your teeth with a soft toothbrush and be gentle when you floss. Contact a health care provider if:  You have dizziness.  You have mild pelvic cramps, pelvic pressure, or nagging pain in the abdominal area.  You have persistent nausea, vomiting, or diarrhea.  You have a bad smelling vaginal discharge.  You have pain when you urinate.  You notice increased swelling in your face, hands, legs, or ankles.  You are exposed to fifth disease or chickenpox.  You are exposed to [name] measles (rubella) and have never had it. Get help right away if:  You have a fever.  You are leaking fluid from your vagina.  You have spotting or bleeding from your vagina.  You have severe abdominal cramping or pain.  You have rapid weight gain or loss.  You vomit blood or material that looks like coffee grounds.  You develop a severe headache.  You have shortness of breath.  You have any kind of trauma, such as from a fall or a car accident. Summary  The first trimester of pregnancy is from week 1 until the end of week 13 (months 1 through 3).  Your body goes through many changes during pregnancy. The changes vary from woman to woman.  You will have routine prenatal visits. During those visits,  your health care provider will examine you, discuss any test results you may have, and talk with you about how you are feeling. This information is not intended to replace advice given to you by your health care provider. Make sure you discuss any questions you have with your health care provider. Document Revised: 07/11/2017 Document Reviewed: 07/10/2016 Elsevier Patient Education  2020 [employer].

## 2019-08-16 NOTE — MAU Provider Note (Addendum)
History     CSN: [id number]  Arrival date and time: 08/16/19 1328   First Provider Initiated Contact with Patient 08/16/19 1446      Chief Complaint  Patient presents with  . Emesis   [name] is a 29 y.o. G3P1011 at [redacted]w[redacted]d who presents to MAU for N/V. Pt reports she has been "back and forth" to MAU for weeks.  Onset: 4 weeks ago Location: stomach Duration: 4 weeks Character: nausea constantly, dizzy today, "vomiting all day regardless of whether I eat or not," pt reports she ate last night (chicken soup) and had Ensure and water this morning Aggravating/Associated: none/none Relieving: none Treatment: Reglan, Phenergan, Zofran, Scopolamine - pt reports none work and has not tried using any of them together; pt reports she last used Phenergan this morning around 5AM  Pt denies VB, vaginal discharge/odor/itching. Pt denies constipation, diarrhea, or urinary problems. Pt denies fever, chills, fatigue, sweating or changes in appetite. Pt denies SOB or chest pain. Pt denies dizziness, HA, light-headedness, weakness.  Problems this pregnancy include: N/V. Allergies? NKDA Current medications/supplements? Per above, pt confirms though Flexeril and Fioricet are on her medication list, she is not currently taking them and cannot remember the last time she took them because it has been that long. Prenatal care provider? none   OB History    Gravida  3   Para  1   Term  1   Preterm      AB  1   Living  1     SAB  1   TAB      Ectopic      Multiple      Live Births  1           Past Medical History:  Diagnosis Date  . Depression   . Headache   . Vaginal Pap smear, abnormal     Past Surgical History:  Procedure Laterality Date  . COLPOSCOPY      Family History  Problem Relation Age of Onset  . Diabetes Mother     Social History   Tobacco Use  . Smoking status: Never Smoker  . Smokeless tobacco: Never Used  Substance Use Topics  .  Alcohol use: Not Currently    Comment: occasional  . Drug use: No    Allergies: No Known Allergies  Medications Prior to Admission  Medication Sig Dispense Refill Last Dose  . butalbital-acetaminophen-caffeine (FIORICET) 50-325-40 MG tablet Take 1-2 tablets by mouth every 6 (six) hours as needed for headache. 20 tablet 0   . cyclobenzaprine (FLEXERIL) 5 MG tablet Take 1 tablet (5 mg total) by mouth 3 (three) times daily as needed for muscle spasms. 20 tablet 0   . docusate sodium (COLACE) 100 MG capsule Take 100 mg by mouth 2 (two) times daily as needed for mild constipation.     . Prenatal MV-Min-FA-Omega-3 (PRENATAL GUMMIES/DHA & FA PO) Take 2 tablets by mouth daily.     . promethazine (PHENERGAN) 25 MG tablet Take 1 tablet (25 mg total) by mouth every 6 (six) hours as needed for nausea or vomiting. 30 tablet 2     Review of Systems  Constitutional: Negative for chills, diaphoresis, fatigue and fever.  Eyes: Negative for visual disturbance.  Respiratory: Negative for shortness of breath.   Cardiovascular: Negative for chest pain.  Gastrointestinal: Positive for abdominal pain (between umbilicus and xyphoid process), nausea and vomiting. Negative for constipation and diarrhea.  Genitourinary: Negative for dysuria, flank pain, frequency,  pelvic pain, urgency, vaginal bleeding and vaginal discharge.  Neurological: Positive for dizziness. Negative for weakness, light-headedness and headaches.   Physical Exam   Blood pressure 116/77, pulse 97, temperature 98.3 F (36.8 C), resp. rate 18, weight 60.8 kg, last menstrual period 06/02/2019, SpO2 100 %.  Patient Vitals for the past 24 hrs:  BP Temp Pulse Resp SpO2 Weight  08/16/19 2300 112/63 - (!) 110 - - -  08/16/19 1740 - - - - 100 % -  08/16/19 1735 - - - - 100 % -  08/16/19 1730 - - - - 100 % -  08/16/19 1725 - - - - 100 % -  08/16/19 1720 - - - - 100 % -  08/16/19 1715 - - - - 99 % -  08/16/19 1714 116/77 - 97 - - -  08/16/19  1410 109/76 98.3 F (36.8 C) (!) 142 18 - 60.8 kg   Physical Exam  Constitutional: She is oriented to person, place, and time. She appears well-developed and well-nourished. No distress.  HENT:  Head: Normocephalic and atraumatic.  Respiratory: Effort normal.  GI: Soft. She exhibits no distension and no mass. There is no abdominal tenderness. There is no rebound and no guarding.  Neurological: She is alert and oriented to person, place, and time.  Skin: Skin is warm and dry. She is not diaphoretic.  Psychiatric: She has a normal mood and affect. Her behavior is normal. Judgment and thought content normal.   Results for orders placed or performed during the hospital encounter of 08/16/19 (from the past 24 hour(s))  Urinalysis, Routine w reflex microscopic     Status: Abnormal   Collection Time: 08/16/19  1:52 PM  Result Value Ref Range   Color, Urine AMBER (A) YELLOW   APPearance HAZY (A) CLEAR   Specific Gravity, Urine 1.030 1.005 - 1.030   pH 6.0 5.0 - 8.0   Glucose, UA NEGATIVE NEGATIVE mg/dL   Hgb urine dipstick MODERATE (A) NEGATIVE   Bilirubin Urine SMALL (A) NEGATIVE   Ketones, ur 80 (A) NEGATIVE mg/dL   Protein, ur 100 (A) NEGATIVE mg/dL   Nitrite NEGATIVE NEGATIVE   Leukocytes,Ua NEGATIVE NEGATIVE   RBC / HPF 11-20 0 - 5 RBC/hpf   WBC, UA 6-10 0 - 5 WBC/hpf   Bacteria, UA FEW (A) NONE SEEN   Squamous Epithelial / LPF 6-10 0 - 5   Mucus PRESENT    Hyaline Casts, UA PRESENT   Urine rapid drug screen (hosp performed)     Status: None   Collection Time: 08/16/19  1:52 PM  Result Value Ref Range   Opiates NONE DETECTED NONE DETECTED   Cocaine NONE DETECTED NONE DETECTED   Benzodiazepines NONE DETECTED NONE DETECTED   Amphetamines NONE DETECTED NONE DETECTED   Tetrahydrocannabinol NONE DETECTED NONE DETECTED   Barbiturates NONE DETECTED NONE DETECTED  CBC with Differential/Platelet     Status: Abnormal   Collection Time: 08/16/19  2:54 PM  Result Value Ref Range   WBC  12.9 (H) 4.0 - 10.5 K/uL   RBC 5.02 3.87 - 5.11 MIL/uL   Hemoglobin 13.2 12.0 - 15.0 g/dL   HCT 38.8 36.0 - 46.0 %   MCV 77.3 (L) 80.0 - 100.0 fL   MCH 26.3 26.0 - 34.0 pg   MCHC 34.0 30.0 - 36.0 g/dL   RDW 14.4 11.5 - 15.5 %   Platelets 661 (H) 150 - 400 K/uL   nRBC 0.0 0.0 - 0.2 %   Neutrophils  Relative % 81 %   Neutro Abs 10.4 (H) 1.7 - 7.7 K/uL   Lymphocytes Relative 12 %   Lymphs Abs 1.5 0.7 - 4.0 K/uL   Monocytes Relative 7 %   Monocytes Absolute 0.9 0.1 - 1.0 K/uL   Eosinophils Relative 0 %   Eosinophils Absolute 0.0 0.0 - 0.5 K/uL   Basophils Relative 0 %   Basophils Absolute 0.0 0.0 - 0.1 K/uL   Immature Granulocytes 0 %   Abs Immature Granulocytes 0.05 0.00 - 0.07 K/uL  Comprehensive metabolic panel     Status: Abnormal   Collection Time: 08/16/19  2:54 PM  Result Value Ref Range   Sodium 133 (L) 135 - 145 mmol/L   Potassium 2.9 (L) 3.5 - 5.1 mmol/L   Chloride 98 98 - 111 mmol/L   CO2 21 (L) 22 - 32 mmol/L   Glucose, Bld 98 70 - 99 mg/dL   BUN 12 6 - 20 mg/dL   Creatinine, Ser 0.92 0.44 - 1.00 mg/dL   Calcium 9.2 8.9 - 10.3 mg/dL   Total Protein 7.6 6.5 - 8.1 g/dL   Albumin 3.3 (L) 3.5 - 5.0 g/dL   AST 100 (H) 15 - 41 U/L   ALT 160 (H) 0 - 44 U/L   Alkaline Phosphatase 92 38 - 126 U/L   Total Bilirubin 1.8 (H) 0.3 - 1.2 mg/dL   GFR calc non Af Amer >60 >60 mL/min   GFR calc Af Amer >60 >60 mL/min   Anion gap 14 5 - 15  Magnesium     Status: None   Collection Time: 08/16/19  2:54 PM  Result Value Ref Range   Magnesium 2.1 1.7 - 2.4 mg/dL   US OB LESS THAN 14 WEEKS WITH OB TRANSVAGINAL  Result Date: 08/16/2019 CLINICAL DATA:  Pelvic pain and cramping EXAM: OBSTETRIC <14 WK [name] AND TRANSVAGINAL OB US TECHNIQUE: Both transabdominal and transvaginal ultrasound examinations were performed for complete evaluation of the gestation as well as the maternal uterus, adnexal regions, and pelvic cul-de-sac. Transvaginal technique was performed to assess early pregnancy.  COMPARISON:  None. FINDINGS: Intrauterine gestational sac: Present Yolk sac:  Present Embryo:  Present Cardiac Activity: Present Heart Rate: 174 bpm CRL:  43.5 mm   11 w   1 d                  [name] EDC: 03/05/2020 Subchorionic hemorrhage:  None visualized. Maternal uterus/adnexae: Within normal limits. IMPRESSION: Single live intrauterine gestation at 11 weeks 1 day. Follow-up can be performed as clinically indicated. Electronically Signed   By: Inez Catalina M.D.   On: 08/16/2019 18:17   MAU Course  Procedures  MDM -N/V x4wks -pt reports starting weight pre-pregnancy was 156lbs -initial weight in MAU 06/26/2019 71.1kg (156.7lbs) -weight today 60.8kg (134.04lbs), loss of 22.66lbs, 14% weight loss -UA: amber/hazy/mod hgb/sm bilirubin/80ketones/100PRO/few bacteria, sending urine for culture -CBC: WBCs 12.9, platelets 661 (acute reaction per Dr. Kennon Rounds) -CMP: NA 133, K 2.9, AST/ALT 100/160 -Mg: normal (2.1) -[name]: single IUP, FHR 174, [redacted]w[redacted]d, no Erma, normal ovaries, no free fluid -UDS: negative -of note, patient was admitted to hospital on 02/06/2019 with IUFD @15wks  with mulitple hospital visits for hyperemesis, AKI, markedly elevated LFTs, low K and generally dehydrated with 8-10kg weight loss. -consulted with Dr. Kennon Rounds @352PM . Per Dr. Kennon Rounds, try a group of hyperemesis medications (zofran, protonix, scopolamine). If patient able to eat and drink successfully after administration, pt can be discharged home. If not, call Dr. Kennon Rounds  again for discussion. -when provider when back in room to discuss treatment plan with patient, pt reporting "significant abdominal pain" that she identifies as between her umbilicus and zyphoid process. In addition, pt pulse initially elevated at 142, repeat after fluids 97. EKG ordered. -EKG: T-wave abnormality, consider inferior ischemia -[employer] paged 90PM. Zacarias Pontes Unassigned paged [phone]. -Dr. Terrence Dupont returned phone call @538PM . Dr. Terrence Dupont unable to view image in  computer, result reports verbally. Per Dr. Terrence Dupont, if patient is not having any chest pain, pt does not need to be seen. Confirmed with patient she is not having chest pain. Dr. Terrence Dupont recommended calling hospitalist if concerned about abdominal pain. -tech did not scan EKG, so a photo was taken and added to the media tab in patient's chart. -consulted with Dr. Rip Harbour just before 6PM. Per Dr. Rip Harbour, EKG OK. Pt needs appropriate regimen of group of N/V medications that she is compliant with. Pt should also be advised to consume only liquids and Ensure for 5-7days before reintroducing solid foods. Also facilitate making OB appt with one of our facilities. Pt also needs two runs 3mEq potassium in MAU. No concern at this time about high platelets or liver enzymes. No need to consult hospitalist for abdominal pain. -after administration of 2L LR, pt able to eat ice chips and drink. Pt also reports stomach pain is reduced from 7/10 - 4/10 and her dizziness has resolved. -pt discharged to home in stable condition after potassium infusions  Orders Placed This Encounter  Procedures  . Culture, OB Urine    Standing Status:   Standing    Number of Occurrences:   1  . US OB LESS THAN 14 WEEKS WITH OB TRANSVAGINAL    Standing Status:   Standing    Number of Occurrences:   1    Order Specific Question:   Symptom/Reason for Exam    Answer:   Abdominal pain in pregnancy [id number]  . Urinalysis, Routine w reflex microscopic    Standing Status:   Standing    Number of Occurrences:   1  . CBC with Differential/Platelet    Standing Status:   Standing    Number of Occurrences:   1  . Comprehensive metabolic panel    Standing Status:   Standing    Number of Occurrences:   1  . Magnesium    Standing Status:   Standing    Number of Occurrences:   1  . Urine rapid drug screen (hosp performed)    Standing Status:   Standing    Number of Occurrences:   1  . EKG 12-Lead    Standing Status:   Standing    Number  of Occurrences:   1    Order Specific Question:   Reason for Exam    Answer:   tachycardia   Meds ordered this encounter  Medications  . ondansetron (ZOFRAN) 8 mg in sodium chloride 0.9 % 50 mL IVPB  . pantoprazole (PROTONIX) injection 40 mg  . scopolamine (TRANSDERM-SCOP) 1 MG/3DAYS 1.5 mg  . lactated ringers bolus 1,000 mL  . lactated ringers infusion  . potassium chloride 10 mEq in 100 mL IVPB  . scopolamine (TRANSDERM-SCOP) 1 MG/3DAYS    Sig: Place 1 patch (1.5 mg total) onto the skin every 3 (three) days.    Dispense:  10 patch    Refill:  12    Order Specific Question:   Supervising Provider    Answer:   [name] [id number]  .  feeding supplement, ENSURE ENLIVE, (ENSURE ENLIVE) LIQD    Sig: Take 237 mLs by mouth 2 (two) times daily between meals.    Dispense:  237 mL    Refill:  14    Order Specific Question:   Supervising Provider    Answer:   [name] [id number]  . ondansetron (ZOFRAN ODT) 8 MG disintegrating tablet    Sig: Take 1 tablet (8 mg total) by mouth every 8 (eight) hours as needed for nausea or vomiting.    Dispense:  60 tablet    Refill:  2    Order Specific Question:   Supervising Provider    Answer:   [name] [id number]  . pantoprazole (PROTONIX) 20 MG tablet    Sig: Take 1 tablet (20 mg total) by mouth daily.    Dispense:  30 tablet    Refill:  1    Order Specific Question:   Supervising Provider    Answer:   [name] [id number]   Assessment and Plan   1. Hyperemesis gravidarum   2. Abdominal pain in pregnancy   3. [redacted] weeks gestation of pregnancy   4. Thrombocytosis ([place])   5. Hypokalemia    Allergies as of 08/16/2019   No Known Allergies     Medication List    STOP taking these medications   butalbital-acetaminophen-caffeine 50-325-40 MG tablet Commonly known as: FIORICET   cyclobenzaprine 5 MG tablet Commonly known as: FLEXERIL   promethazine 25 MG tablet Commonly known as: PHENERGAN     TAKE these medications   docusate  sodium 100 MG capsule Commonly known as: COLACE Take 100 mg by mouth 2 (two) times daily as needed for mild constipation.   feeding supplement (ENSURE ENLIVE) Liqd Take 237 mLs by mouth 2 (two) times daily between meals.   ondansetron 8 MG disintegrating tablet Commonly known as: Zofran ODT Take 1 tablet (8 mg total) by mouth every 8 (eight) hours as needed for nausea or vomiting.   pantoprazole 20 MG tablet Commonly known as: Protonix Take 1 tablet (20 mg total) by mouth daily.   PRENATAL GUMMIES/DHA & FA PO Take 2 tablets by mouth daily.   scopolamine 1 MG/3DAYS Commonly known as: TRANSDERM-SCOP Place 1 patch (1.5 mg total) onto the skin every 3 (three) days. Start taking on: August 19, 2019      -will call with culture results, if positive -pt has NOB visit, virtual, with [facility] provider on 08/17/2018 -pt advised liquid diet only for 5-7days -note sent to NOB provider, Kerry Hough, regarding hypokalemia and need for KDur after liquid diet period ends -RX Ensure -RX zofran -RX protonix -RX scopolamine -safe meds in pregnancy list given -pt advised to take medications around the clock and not to stop taking if feeling better -discussed nonpharmacologic and pharmacologic treatments of N/V -discussed normal expectations for N/V in pregnancy -strict hyperemesis/return MAU precautions given -pt discharged to home in stable condition  Elmyra Ricks E Nugent 08/16/2019, 8:02 PM

## 2019-08-16 NOTE — MAU Note (Signed)
.   Jody Robinson is a 29 y.o. at [redacted]w[redacted]d here in MAU reporting: nausea and vomiting and not able to keep anything down. States she was evaluated in MAU and given fluids about a week ago. Lower abdominal cramping LMP: 06/02/19 Onset of complaint: ongoing with pregnancy  Pain score: 7 Vitals:   08/16/19 1410  BP: 109/76  Pulse: (!) 142  Resp: 18  Temp: 98.3 F (36.8 C)     FHT: Lab orders placed from triage: UA

## 2019-08-17 LAB — CULTURE, OB URINE

## 2019-08-18 ENCOUNTER — Encounter: Payer: 59 | Admitting: Medical

## 2019-08-18 ENCOUNTER — Encounter: Payer: Self-pay | Admitting: Medical

## 2019-08-18 NOTE — Progress Notes (Signed)
Appointment rescheduled. Needs to be in person.   Luvenia Redden, PA-C 08/18/2019 10:15 AM

## 2019-08-19 ENCOUNTER — Encounter: Payer: Self-pay | Admitting: Family Medicine

## 2019-08-19 ENCOUNTER — Ambulatory Visit (INDEPENDENT_AMBULATORY_CARE_PROVIDER_SITE_OTHER): Payer: 59 | Admitting: Obstetrics and Gynecology

## 2019-08-19 ENCOUNTER — Encounter: Payer: Self-pay | Admitting: Obstetrics and Gynecology

## 2019-08-19 ENCOUNTER — Other Ambulatory Visit: Payer: Self-pay

## 2019-08-19 VITALS — BP 121/85 | HR 110 | Wt 134.7 lb

## 2019-08-19 DIAGNOSIS — R7989 Other specified abnormal findings of blood chemistry: Secondary | ICD-10-CM

## 2019-08-19 DIAGNOSIS — O0991 Supervision of high risk pregnancy, unspecified, first trimester: Secondary | ICD-10-CM

## 2019-08-19 DIAGNOSIS — E876 Hypokalemia: Secondary | ICD-10-CM

## 2019-08-19 DIAGNOSIS — O210 Mild hyperemesis gravidarum: Secondary | ICD-10-CM

## 2019-08-19 DIAGNOSIS — Z3A11 11 weeks gestation of pregnancy: Secondary | ICD-10-CM

## 2019-08-19 DIAGNOSIS — O0990 Supervision of high risk pregnancy, unspecified, unspecified trimester: Secondary | ICD-10-CM

## 2019-08-19 DIAGNOSIS — O021 Missed abortion: Secondary | ICD-10-CM

## 2019-08-19 DIAGNOSIS — Z3491 Encounter for supervision of normal pregnancy, unspecified, first trimester: Secondary | ICD-10-CM

## 2019-08-19 DIAGNOSIS — R945 Abnormal results of liver function studies: Secondary | ICD-10-CM

## 2019-08-19 MED ORDER — PROMETHAZINE HCL 12.5 MG RE SUPP: 12.5000 mg | Freq: Four times a day (QID) | RECTAL | 2 refills | Status: DC | PRN

## 2019-08-19 NOTE — Patient Instructions (Signed)
First Trimester of Pregnancy The first trimester of pregnancy is from week 1 until the end of week 13 (months 1 through 3). A week after a sperm fertilizes an egg, the egg will implant on the wall of the uterus. This embryo will begin to develop into a baby. Genes from you and your partner will form the baby. The female genes will determine whether the baby will be a boy or a girl. At 6-8 weeks, the eyes and face will be formed, and the heartbeat can be seen on ultrasound. At the end of 12 weeks, all the baby's organs will be formed. Now that you are pregnant, you will want to do everything you can to have a healthy baby. Two of the most important things are to get good prenatal care and to follow your health care provider's instructions. Prenatal care is all the medical care you receive before the baby's birth. This care will help prevent, find, and treat any problems during the pregnancy and childbirth. Body changes during your first trimester Your body goes through many changes during pregnancy. The changes vary from woman to woman.  You may gain or lose a couple of pounds at first.  You may feel sick to your stomach (nauseous) and you may throw up (vomit). If the vomiting is uncontrollable, call your health care provider.  You may tire easily.  You may develop headaches that can be relieved by medicines. All medicines should be approved by your health care provider.  You may urinate more often. Painful urination may mean you have a bladder infection.  You may develop heartburn as a result of your pregnancy.  You may develop constipation because certain hormones are causing the muscles that push stool through your intestines to slow down.  You may develop hemorrhoids or swollen veins (varicose veins).  Your breasts may begin to grow larger and become tender. Your nipples may stick out more, and the tissue that surrounds them (areola) may become darker.  Your gums may bleed and may be  sensitive to brushing and flossing.  Dark spots or blotches (chloasma, mask of pregnancy) may develop on your face. This will likely fade after the baby is born.  Your menstrual periods will stop.  You may have a loss of appetite.  You may develop cravings for certain kinds of food.  You may have changes in your emotions from day to day, such as being excited to be pregnant or being concerned that something may go wrong with the pregnancy and baby.  You may have more vivid and strange dreams.  You may have changes in your hair. These can include thickening of your hair, rapid growth, and changes in texture. Some women also have hair loss during or after pregnancy, or hair that feels dry or thin. Your hair will most likely return to normal after your baby is born. What to expect at prenatal visits During a routine prenatal visit:  You will be weighed to make sure you and the baby are growing normally.  Your blood pressure will be taken.  Your abdomen will be measured to track your baby's growth.  The fetal heartbeat will be listened to between weeks 10 and 14 of your pregnancy.  Test results from any previous visits will be discussed. Your health care provider may ask you:  How you are feeling.  If you are feeling the baby move.  If you have had any abnormal symptoms, such as leaking fluid, bleeding, severe headaches, or abdominal   cramping.  If you are using any tobacco products, including cigarettes, chewing tobacco, and electronic cigarettes.  If you have any questions. Other tests that may be performed during your first trimester include:  Blood tests to find your blood type and to check for the presence of any previous infections. The tests will also be used to check for low iron levels (anemia) and protein on red blood cells (Rh antibodies). Depending on your risk factors, or if you previously had diabetes during pregnancy, you may have tests to check for high blood sugar  that affects pregnant women (gestational diabetes).  Urine tests to check for infections, diabetes, or protein in the urine.  An ultrasound to confirm the proper growth and development of the baby.  Fetal screens for spinal cord problems (spina bifida) and Down syndrome.  HIV (human immunodeficiency virus) testing. Routine prenatal testing includes screening for HIV, unless you choose not to have this test.  You may need other tests to make sure you and the baby are doing well. Follow these instructions at home: Medicines  Follow your health care provider's instructions regarding medicine use. Specific medicines may be either safe or unsafe to take during pregnancy.  Take a prenatal vitamin that contains at least 600 micrograms (mcg) of folic acid.  If you develop constipation, try taking a stool softener if your health care provider approves. Eating and drinking   Eat a balanced diet that includes fresh fruits and vegetables, whole grains, good sources of protein such as meat, eggs, or tofu, and low-fat dairy. Your health care provider will help you determine the amount of weight gain that is right for you.  Avoid raw meat and uncooked cheese. These carry germs that can cause birth defects in the baby.  Eating four or five small meals rather than three large meals a day may help relieve nausea and vomiting. If you start to feel nauseous, eating a few soda crackers can be helpful. Drinking liquids between meals, instead of during meals, also seems to help ease nausea and vomiting.  Limit foods that are high in fat and processed sugars, such as fried and sweet foods.  To prevent constipation: ? Eat foods that are high in fiber, such as fresh fruits and vegetables, whole grains, and beans. ? Drink enough fluid to keep your urine clear or pale yellow. Activity  Exercise only as directed by your health care provider. Most women can continue their usual exercise routine during  pregnancy. Try to exercise for 30 minutes at least 5 days a week. Exercising will help you: ? Control your weight. ? Stay in shape. ? Be prepared for labor and delivery.  Experiencing pain or cramping in the lower abdomen or lower back is a good sign that you should stop exercising. Check with your health care provider before continuing with normal exercises.  Try to avoid standing for long periods of time. Move your legs often if you must stand in one place for a long time.  Avoid heavy lifting.  Wear low-heeled shoes and practice good posture.  You may continue to have sex unless your health care provider tells you not to. Relieving pain and discomfort  Wear a good support bra to relieve breast tenderness.  Take warm sitz baths to soothe any pain or discomfort caused by hemorrhoids. Use hemorrhoid cream if your health care provider approves.  Rest with your legs elevated if you have leg cramps or low back pain.  If you develop varicose veins in   your legs, wear support hose. Elevate your feet for 15 minutes, 3-4 times a day. Limit salt in your diet. Prenatal care  Schedule your prenatal visits by the twelfth week of pregnancy. They are usually scheduled monthly at first, then more often in the last 2 months before delivery.  Write down your questions. Take them to your prenatal visits.  Keep all your prenatal visits as told by your health care provider. This is important. Safety  Wear your seat belt at all times when driving.  Make a list of emergency phone numbers, including numbers for family, friends, the hospital, and police and fire departments. General instructions  Ask your health care provider for a referral to a local prenatal education class. Begin classes no later than the beginning of month 6 of your pregnancy.  Ask for help if you have counseling or nutritional needs during pregnancy. Your health care provider can offer advice or refer you to specialists for help  with various needs.  Do not use hot tubs, steam rooms, or saunas.  Do not douche or use tampons or scented sanitary pads.  Do not cross your legs for long periods of time.  Avoid cat litter boxes and soil used by cats. These carry germs that can cause birth defects in the baby and possibly loss of the fetus by miscarriage or stillbirth.  Avoid all smoking, herbs, alcohol, and medicines not prescribed by your health care provider. Chemicals in these products affect the formation and growth of the baby.  Do not use any products that contain nicotine or tobacco, such as cigarettes and e-cigarettes. If you need help quitting, ask your health care provider. You may receive counseling support and other resources to help you quit.  Schedule a dentist appointment. At home, brush your teeth with a soft toothbrush and be gentle when you floss. Contact a health care provider if:  You have dizziness.  You have mild pelvic cramps, pelvic pressure, or nagging pain in the abdominal area.  You have persistent nausea, vomiting, or diarrhea.  You have a bad smelling vaginal discharge.  You have pain when you urinate.  You notice increased swelling in your face, hands, legs, or ankles.  You are exposed to fifth disease or chickenpox.  You are exposed to German measles (rubella) and have never had it. Get help right away if:  You have a fever.  You are leaking fluid from your vagina.  You have spotting or bleeding from your vagina.  You have severe abdominal cramping or pain.  You have rapid weight gain or loss.  You vomit blood or material that looks like coffee grounds.  You develop a severe headache.  You have shortness of breath.  You have any kind of trauma, such as from a fall or a car accident. Summary  The first trimester of pregnancy is from week 1 until the end of week 13 (months 1 through 3).  Your body goes through many changes during pregnancy. The changes vary from  woman to woman.  You will have routine prenatal visits. During those visits, your health care provider will examine you, discuss any test results you may have, and talk with you about how you are feeling. This information is not intended to replace advice given to you by your health care provider. Make sure you discuss any questions you have with your health care provider. Document Revised: 07/11/2017 Document Reviewed: 07/10/2016 Elsevier Patient Education  2020 [employer] Inc.  

## 2019-08-19 NOTE — Progress Notes (Signed)
Subjective:  Jody Robinson is a 28 y.o. G3P1011 at 37w1dbeing seen today for her first OB visit. EDD by LMP and conformed by first trimester U/S. Problems with hyperemesis. A little better today on Pepcid and Zofran. Unable to afford scopolamine patch. H/O FDIU at 15 4/7 weeks last June, complicated at that time by hyperemesis, AKI and abnormal LFT's. She declined genetic testing at time of demise.    She is currently monitored for the following issues for this high-risk pregnancy and has IUFD at less than 20 weeks of gestation; Hypokalemia; Abnormal LFTs; Supervision of high risk pregnancy, antepartum; Depression; Nausea/vomiting in pregnancy; Hyperemesis gravidarum; Seasonal allergies; Herpes simplex; History of abnormal cervical Pap smear; and Thrombocytosis ([place] their problem list.  Patient reports heartburn and nausea.  Contractions: Not present. Vag. Bleeding: None.  Movement: Absent. Denies leaking of fluid.   The following portions of the patient's history were reviewed and updated as appropriate: allergies, current medications, past family history, past medical history, past social history, past surgical history and problem list. Problem list updated.  Objective:   Vitals:   08/19/19 1110  BP: 121/85  Pulse: (!) 110  Weight: 134 lb 11.2 oz (61.1 kg)    Fetal Status: Fetal Heart Rate (bpm): 166   Movement: Absent     General:  Alert, oriented and cooperative. Patient is in no acute distress.  Skin: Skin is warm and dry. No rash noted.   Cardiovascular: Normal heart rate noted  Respiratory: Normal respiratory effort, no problems with respiration noted  Abdomen: Soft, gravid, appropriate for gestational age. Pain/Pressure: Present     Pelvic:  Cervical exam deferred        Extremities: Normal range of motion.  Edema: None  Mental Status: Normal mood and affect. Normal behavior. Normal judgment and thought content.   Urinalysis:      Assessment and Plan:  Pregnancy:  G3P1011 at 142w1d1. Encounter for supervision of low-risk pregnancy in first trimester Prenatal care and labs reviewed with pt. Pap smear up to date Genetic testing reviewed Declined flu vaccine Will add Phenergan suppository to N/V regiment Diet modification reviewed with pt  - Culture, OB Urine - Genetic Screening - Obstetric Panel, Including HIV - Hemoglobin A1c - Enroll patient in Babyscripts Program  2. Supervision of high risk pregnancy, antepartum See above - TSH - Protein / creatinine ratio, urine  3. IUFD at less than 20 weeks of gestation  - Ambulatory referral to Maternal Fetal Medicine  4. Hyperemesis gravidarum See above Work note for 2 weeks provided to pt - Comp Met (CMET)  5. Hypokalemia  - Comp Met (CMET)  6. Abnormal LFTs  - Comp Met (CMET)  Preterm labor symptoms and general obstetric precautions including but not limited to vaginal bleeding, contractions, leaking of fluid and fetal movement were reviewed in detail with the patient. Please refer to After Visit Summary for other counseling recommendations.  Return in about 1 week (around 08/26/2019) for OB visit, MD provider.   ErChancy MilroyMD

## 2019-08-20 ENCOUNTER — Other Ambulatory Visit: Payer: Self-pay

## 2019-08-20 ENCOUNTER — Encounter (HOSPITAL_COMMUNITY): Payer: Self-pay | Admitting: Obstetrics and Gynecology

## 2019-08-20 ENCOUNTER — Inpatient Hospital Stay (HOSPITAL_COMMUNITY): Payer: 59

## 2019-08-20 ENCOUNTER — Observation Stay (HOSPITAL_COMMUNITY)
Admission: AD | Admit: 2019-08-20 | Discharge: 2019-08-22 | DRG: 998 | Disposition: A | Discharge status: Home / Self Care | Payer: 59 | Attending: Obstetrics and Gynecology | Admitting: Obstetrics and Gynecology

## 2019-08-20 DIAGNOSIS — O2662 Liver and biliary tract disorders in childbirth: Principal | ICD-10-CM | POA: Diagnosis present

## 2019-08-20 DIAGNOSIS — Z20822 Contact with and (suspected) exposure to covid-19: Secondary | ICD-10-CM | POA: Diagnosis present

## 2019-08-20 DIAGNOSIS — R945 Abnormal results of liver function studies: Secondary | ICD-10-CM | POA: Diagnosis not present

## 2019-08-20 DIAGNOSIS — K828 Other specified diseases of gallbladder: Secondary | ICD-10-CM

## 2019-08-20 DIAGNOSIS — O26891 Other specified pregnancy related conditions, first trimester: Secondary | ICD-10-CM

## 2019-08-20 DIAGNOSIS — Z3A11 11 weeks gestation of pregnancy: Secondary | ICD-10-CM | POA: Diagnosis not present

## 2019-08-20 DIAGNOSIS — E876 Hypokalemia: Secondary | ICD-10-CM | POA: Diagnosis present

## 2019-08-20 DIAGNOSIS — O2341 Unspecified infection of urinary tract in pregnancy, first trimester: Secondary | ICD-10-CM | POA: Diagnosis not present

## 2019-08-20 DIAGNOSIS — R7989 Other specified abnormal findings of blood chemistry: Secondary | ICD-10-CM

## 2019-08-20 DIAGNOSIS — K859 Acute pancreatitis without necrosis or infection, unspecified: Secondary | ICD-10-CM | POA: Clinically undetermined

## 2019-08-20 DIAGNOSIS — O210 Mild hyperemesis gravidarum: Secondary | ICD-10-CM | POA: Diagnosis present

## 2019-08-20 DIAGNOSIS — O99281 Endocrine, nutritional and metabolic diseases complicating pregnancy, first trimester: Secondary | ICD-10-CM | POA: Diagnosis not present

## 2019-08-20 DIAGNOSIS — O211 Hyperemesis gravidarum with metabolic disturbance: Secondary | ICD-10-CM | POA: Diagnosis not present

## 2019-08-20 DIAGNOSIS — O0990 Supervision of high risk pregnancy, unspecified, unspecified trimester: Secondary | ICD-10-CM

## 2019-08-20 DIAGNOSIS — O26611 Liver and biliary tract disorders in pregnancy, first trimester: Secondary | ICD-10-CM | POA: Diagnosis not present

## 2019-08-20 DIAGNOSIS — O99611 Diseases of the digestive system complicating pregnancy, first trimester: Secondary | ICD-10-CM

## 2019-08-20 HISTORY — DX: Acute pancreatitis without necrosis or infection, unspecified: K85.90

## 2019-08-20 HISTORY — DX: Unspecified abnormal cytological findings in specimens from cervix uteri: R87.619

## 2019-08-20 LAB — OBSTETRIC PANEL, INCLUDING HIV
Antibody Screen: NEGATIVE
Basophils Absolute: 0 10*3/uL (ref 0.0–0.2)
Basos: 0 %
EOS (ABSOLUTE): 0 10*3/uL (ref 0.0–0.4)
Eos: 0 %
HIV Screen 4th Generation wRfx: NONREACTIVE
Hematocrit: 34.7 % (ref 34.0–46.6)
Hemoglobin: 11.5 g/dL (ref 11.1–15.9)
Hepatitis B Surface Ag: NEGATIVE
Immature Grans (Abs): 0 10*3/uL (ref 0.0–0.1)
Immature Granulocytes: 0 %
Lymphocytes Absolute: 1.5 10*3/uL (ref 0.7–3.1)
Lymphs: 16 %
MCH: 26.9 pg (ref 26.6–33.0)
MCHC: 33.1 g/dL (ref 31.5–35.7)
MCV: 81 fL (ref 79–97)
Monocytes Absolute: 0.5 10*3/uL (ref 0.1–0.9)
Monocytes: 6 %
Neutrophils Absolute: 7.4 10*3/uL — ABNORMAL HIGH (ref 1.4–7.0)
Neutrophils: 78 %
Platelets: 562 10*3/uL — ABNORMAL HIGH (ref 150–450)
RBC: 4.28 x10E6/uL (ref 3.77–5.28)
RDW: 14.9 % (ref 11.7–15.4)
RPR Ser Ql: NONREACTIVE
Rh Factor: POSITIVE
Rubella Antibodies, IGG: 2.65 index (ref >0.99)
WBC: 9.6 10*3/uL (ref 3.4–10.8)

## 2019-08-20 LAB — CBC WITH DIFFERENTIAL/PLATELET
Abs Immature Granulocytes: 0.04 10*3/uL (ref 0.00–0.07)
Basophils Absolute: 0 10*3/uL (ref 0.0–0.1)
Basophils Relative: 0 %
Eosinophils Absolute: 0 10*3/uL (ref 0.0–0.5)
Eosinophils Relative: 0 %
HCT: 37.8 % (ref 36.0–46.0)
Hemoglobin: 12.4 g/dL (ref 12.0–15.0)
Immature Granulocytes: 0 %
Lymphocytes Relative: 13 %
Lymphs Abs: 1.6 10*3/uL (ref 0.7–4.0)
MCH: 25.8 pg — ABNORMAL LOW (ref 26.0–34.0)
MCHC: 32.8 g/dL (ref 30.0–36.0)
MCV: 78.8 fL — ABNORMAL LOW (ref 80.0–100.0)
Monocytes Absolute: 0.8 10*3/uL (ref 0.1–1.0)
Monocytes Relative: 7 %
Neutro Abs: 9.2 10*3/uL — ABNORMAL HIGH (ref 1.7–7.7)
Neutrophils Relative %: 80 %
Platelets: 608 10*3/uL — ABNORMAL HIGH (ref 150–400)
RBC: 4.8 MIL/uL (ref 3.87–5.11)
RDW: 14.3 % (ref 11.5–15.5)
WBC: 11.6 10*3/uL — ABNORMAL HIGH (ref 4.0–10.5)
nRBC: 0 % (ref 0.0–0.2)

## 2019-08-20 LAB — MAGNESIUM: Magnesium: 2 mg/dL (ref 1.7–2.4)

## 2019-08-20 LAB — URINALYSIS, ROUTINE W REFLEX MICROSCOPIC
Bilirubin Urine: NEGATIVE
Glucose, UA: NEGATIVE mg/dL
Ketones, ur: 80 mg/dL — AB
Nitrite: POSITIVE — AB
Protein, ur: 100 mg/dL — AB
Specific Gravity, Urine: 1.027 (ref 1.005–1.030)
pH: 6 (ref 5.0–8.0)

## 2019-08-20 LAB — TSH: TSH: <0.005 u[IU]/mL — ABNORMAL LOW (ref 0.450–4.500)

## 2019-08-20 LAB — COMPREHENSIVE METABOLIC PANEL
ALT: 217 IU/L — ABNORMAL HIGH (ref 0–32)
ALT: 229 U/L — ABNORMAL HIGH (ref 0–44)
AST: 105 IU/L — ABNORMAL HIGH (ref 0–40)
AST: 98 U/L — ABNORMAL HIGH (ref 15–41)
Albumin/Globulin Ratio: 1.1 — ABNORMAL LOW (ref 1.2–2.2)
Albumin: 3.7 g/dL — ABNORMAL LOW (ref 3.9–5.0)
Albumin: 3.3 g/dL — ABNORMAL LOW (ref 3.5–5.0)
Alkaline Phosphatase: 102 IU/L (ref 39–117)
Alkaline Phosphatase: 93 U/L (ref 38–126)
Anion gap: 13 (ref 5–15)
BUN/Creatinine Ratio: 11 (ref 9–23)
BUN: 7 mg/dL (ref 6–20)
BUN: 7 mg/dL (ref 6–20)
Bilirubin Total: 0.7 mg/dL (ref 0.0–1.2)
CO2: 22 mmol/L (ref 20–29)
CO2: 24 mmol/L (ref 22–32)
Calcium: 9.4 mg/dL (ref 8.7–10.2)
Calcium: 8.8 mg/dL — ABNORMAL LOW (ref 8.9–10.3)
Chloride: 97 mmol/L (ref 96–106)
Chloride: 97 mmol/L — ABNORMAL LOW (ref 98–111)
Creatinine, Ser: 0.64 mg/dL (ref 0.57–1.00)
Creatinine, Ser: 0.75 mg/dL (ref 0.44–1.00)
GFR calc Af Amer: 139 mL/min/{1.73_m2} (ref >59)
GFR calc Af Amer: >60 mL/min (ref >60)
GFR calc non Af Amer: 121 mL/min/{1.73_m2} (ref >59)
GFR calc non Af Amer: >60 mL/min (ref >60)
Globulin, Total: 3.4 g/dL (ref 1.5–4.5)
Glucose, Bld: 73 mg/dL (ref 70–99)
Glucose: 102 mg/dL — ABNORMAL HIGH (ref 65–99)
Potassium: 3.5 mmol/L (ref 3.5–5.2)
Potassium: 2.7 mmol/L — CL (ref 3.5–5.1)
Sodium: 138 mmol/L (ref 134–144)
Sodium: 134 mmol/L — ABNORMAL LOW (ref 135–145)
Total Bilirubin: 1.4 mg/dL — ABNORMAL HIGH (ref 0.3–1.2)
Total Protein: 7.1 g/dL (ref 6.0–8.5)
Total Protein: 7.5 g/dL (ref 6.5–8.1)

## 2019-08-20 LAB — PROTEIN / CREATININE RATIO, URINE
Creatinine, Urine: 344.1 mg/dL
Protein, Ur: 73.4 mg/dL
Protein/Creat Ratio: 213 mg/g creat — ABNORMAL HIGH (ref 0–200)

## 2019-08-20 LAB — RAPID URINE DRUG SCREEN, HOSP PERFORMED
Amphetamines: NOT DETECTED
Barbiturates: NOT DETECTED
Benzodiazepines: NOT DETECTED
Cocaine: NOT DETECTED
Opiates: NOT DETECTED
Tetrahydrocannabinol: NOT DETECTED

## 2019-08-20 LAB — HEMOGLOBIN A1C
Est. average glucose Bld gHb Est-mCnc: 91 mg/dL
Hgb A1c MFr Bld: 4.8 % (ref 4.8–5.6)

## 2019-08-20 LAB — AMYLASE: Amylase: 290 U/L — ABNORMAL HIGH (ref 28–100)

## 2019-08-20 LAB — T4, FREE: Free T4: 2.19 ng/dL — ABNORMAL HIGH (ref 0.61–1.12)

## 2019-08-20 LAB — LIPASE, BLOOD: Lipase: 62 U/L — ABNORMAL HIGH (ref 11–51)

## 2019-08-20 MED ORDER — HYDROXYZINE HCL 50 MG/ML IM SOLN
50.0000 mg | Freq: Four times a day (QID) | INTRAMUSCULAR | Status: DC | PRN
  Filled 2019-08-20: qty 1

## 2019-08-20 MED ORDER — ACETAMINOPHEN 325 MG PO TABS: 650.0000 mg | ORAL_TABLET | ORAL | Status: DC | PRN

## 2019-08-20 MED ORDER — CALCIUM CARBONATE ANTACID 500 MG PO CHEW: 2.0000 | CHEWABLE_TABLET | ORAL | Status: DC | PRN

## 2019-08-20 MED ORDER — LACTATED RINGERS IV BOLUS
1000.0000 mL | Freq: Once | INTRAVENOUS | Status: AC
  Administered 2019-08-20: 1000 mL via INTRAVENOUS

## 2019-08-20 MED ORDER — THIAMINE HCL 100 MG/ML IJ SOLN
Freq: Once | INTRAVENOUS | Status: AC
  Filled 2019-08-20: qty 1000

## 2019-08-20 MED ORDER — POTASSIUM CHLORIDE 10 MEQ/100ML IV SOLN
10.0000 meq | INTRAVENOUS | Status: DC
  Filled 2019-08-20 (×2): qty 100

## 2019-08-20 MED ORDER — KCL IN DEXTROSE-NACL 20-5-0.9 MEQ/L-%-% IV SOLN
INTRAVENOUS | Status: DC
  Filled 2019-08-20 (×5): qty 1000

## 2019-08-20 MED ORDER — POTASSIUM CHLORIDE 10 MEQ/100ML IV SOLN
10.0000 meq | INTRAVENOUS | Status: AC
  Administered 2019-08-21 (×3): 10 meq via INTRAVENOUS
  Filled 2019-08-20 (×4): qty 100

## 2019-08-20 MED ORDER — ENOXAPARIN SODIUM 40 MG/0.4ML ~~LOC~~ SOLN
40.0000 mg | SUBCUTANEOUS | Status: DC
  Administered 2019-08-21: 40 mg via SUBCUTANEOUS
  Filled 2019-08-20: qty 0.4

## 2019-08-20 MED ORDER — DOCUSATE SODIUM 100 MG PO CAPS: 100.0000 mg | ORAL_CAPSULE | Freq: Every day | ORAL | Status: DC

## 2019-08-20 MED ORDER — POTASSIUM CHLORIDE 10 MEQ/100ML IV SOLN
10.0000 meq | INTRAVENOUS | Status: AC
  Administered 2019-08-21: 10 meq via INTRAVENOUS
  Filled 2019-08-20 (×4): qty 100

## 2019-08-20 MED ORDER — PROMETHAZINE HCL 25 MG/ML IJ SOLN
25.0000 mg | Freq: Once | INTRAMUSCULAR | Status: DC
  Filled 2019-08-20: qty 1

## 2019-08-20 MED ORDER — PROMETHAZINE HCL 25 MG PO TABS: 12.5000 mg | ORAL_TABLET | ORAL | Status: DC | PRN

## 2019-08-20 MED ORDER — HYDROXYZINE HCL 50 MG PO TABS
50.0000 mg | ORAL_TABLET | Freq: Four times a day (QID) | ORAL | Status: DC | PRN
  Filled 2019-08-20: qty 1

## 2019-08-20 MED ORDER — SODIUM CHLORIDE 0.9 % IV SOLN
8.0000 mg | Freq: Three times a day (TID) | INTRAVENOUS | Status: DC | PRN
  Filled 2019-08-20: qty 4

## 2019-08-20 MED ORDER — ZOLPIDEM TARTRATE 5 MG PO TABS: 5.0000 mg | ORAL_TABLET | Freq: Every evening | ORAL | Status: DC | PRN

## 2019-08-20 MED ORDER — SCOPOLAMINE 1 MG/3DAYS TD PT72
1.0000 | MEDICATED_PATCH | TRANSDERMAL | Status: DC
  Administered 2019-08-20: 1.5 mg via TRANSDERMAL
  Filled 2019-08-20: qty 1

## 2019-08-20 MED ORDER — FAMOTIDINE IN NACL 20-0.9 MG/50ML-% IV SOLN
20.0000 mg | Freq: Every day | INTRAVENOUS | Status: DC
  Administered 2019-08-21 – 2019-08-22 (×2): 20 mg via INTRAVENOUS
  Filled 2019-08-20 (×2): qty 50

## 2019-08-20 MED ORDER — ONDANSETRON HCL 4 MG/2ML IJ SOLN: 4.0000 mg | Freq: Three times a day (TID) | INTRAMUSCULAR | Status: DC | PRN

## 2019-08-20 MED ORDER — ONDANSETRON 4 MG PO TBDP: 4.0000 mg | ORAL_TABLET | Freq: Three times a day (TID) | ORAL | Status: DC | PRN

## 2019-08-20 MED ORDER — PROMETHAZINE HCL 25 MG RE SUPP
12.5000 mg | RECTAL | Status: DC | PRN
  Filled 2019-08-20: qty 1

## 2019-08-20 MED ORDER — FAMOTIDINE IN NACL 20-0.9 MG/50ML-% IV SOLN
20.0000 mg | Freq: Once | INTRAVENOUS | Status: AC
  Administered 2019-08-20: 20 mg via INTRAVENOUS
  Filled 2019-08-20: qty 50

## 2019-08-20 NOTE — MAU Note (Signed)
Pt reports her stomach hurts ever since she took the Protonix yesterday. Stated it made her stomach"ache" Dis not trake any of the antiemtics she was given like  phenegan supp today and has "vomitted all day"

## 2019-08-20 NOTE — H&P (Signed)
FACULTY PRACTICE ANTEPARTUM ADMISSION HISTORY AND PHYSICAL NOTE   History of Present Illness: Jody Robinson is a 29 y.o. G3P1011 at [redacted]w[redacted]d admitted for hyperemesis, pancreatitis, with elevated transaminases, and hypokakemia.  She presented to the MAU tonight with recurrence of persistent nausea and vomiting, despite use of Zofran ODT, Phenergan PR, and Trans-Derm Scop patches, along with PO Protonix 20. Tonight she is found to have marked N & V, abdominal pain left side >Right,  With lab abnormalities of K 2.7, AST 98, ALT 229, Amylase 290 (nl<100), Lipase 62(nl <51), with total Bili elevated at 1.4.  Urine shows 80 Ketones, Nitrites, Protein 100, SG 1.027. Moderate HGB with clear microscopic. Patient reports  vaginal bleeding as none. Patient describes fluid per vagina as None. .  Patient Active Problem List   Diagnosis Date Noted  . Thrombocytosis ([place]) 08/16/2019  . Nausea/vomiting in pregnancy 08/04/2019  . Supervision of high risk pregnancy, antepartum 07/13/2019  . Depression   . Hypokalemia 02/07/2019  . Abnormal LFTs 02/07/2019  . IUFD at less than 20 weeks of gestation 02/06/2019  . Hyperemesis gravidarum 01/15/2019  . Seasonal allergies 10/09/2017  . History of abnormal cervical Pap smear 10/09/2017  . Herpes simplex 01/16/2013    Past Medical History:  Diagnosis Date  . Depression   . Headache   . Vaginal Pap smear, abnormal     Past Surgical History:  Procedure Laterality Date  . COLPOSCOPY      OB History  Gravida Para Term Preterm AB Living  3 1 1   1 1   SAB TAB Ectopic Multiple Live Births  1       1    # Outcome Date GA Lbr Len/2nd Weight Sex Delivery Anes PTL Lv  3 Current           2 SAB 01/2019 [redacted]w[redacted]d         1 Term 03/08/13 [redacted]w[redacted]d  2495 g  Vag-Spont EPI  LIV     Birth Comments: no complications    Social History   Socioeconomic History  . Marital status: Single    Spouse name: Not on file  . Number of children: Not on file  . Years of  education: Not on file  . Highest education level: Not on file  Occupational History  . Not on file  Tobacco Use  . Smoking status: Never Smoker  . Smokeless tobacco: Never Used  Substance and Sexual Activity  . Alcohol use: Not Currently    Comment: occasional  . Drug use: No  . Sexual activity: Yes    Birth control/protection: None  Other Topics Concern  . Not on file  Social History Narrative  . Not on file   Social Determinants of Health   Financial Resource Strain:   . Difficulty of Paying Living Expenses: Not on file  Food Insecurity: No Food Insecurity  . Worried About Charity fundraiser in the Last Year: Never true  . Ran Out of Food in the Last Year: Never true  Transportation Needs: No Transportation Needs  . Lack of Transportation (Medical): No  . Lack of Transportation (Non-Medical): No  Physical Activity:   . Days of Exercise per Week: Not on file  . Minutes of Exercise per Session: Not on file  Stress:   . Feeling of Stress : Not on file  Social Connections:   . Frequency of Communication with Friends and Family: Not on file  . Frequency of Social Gatherings with Friends and Family: Not  on file  . Attends Religious Services: Not on file  . Active Member of Clubs or Organizations: Not on file  . Attends Archivist Meetings: Not on file  . Marital Status: Not on file    Family History  Problem Relation Age of Onset  . Diabetes Mother     No Known Allergies  Medications Prior to Admission  Medication Sig Dispense Refill Last Dose  . docusate sodium (COLACE) 100 MG capsule Take 100 mg by mouth 2 (two) times daily as needed for mild constipation.     . feeding supplement, ENSURE ENLIVE, (ENSURE ENLIVE) LIQD Take 237 mLs by mouth 2 (two) times daily between meals. 237 mL 14   . ondansetron (ZOFRAN ODT) 8 MG disintegrating tablet Take 1 tablet (8 mg total) by mouth every 8 (eight) hours as needed for nausea or vomiting. 60 tablet 2   .  pantoprazole (PROTONIX) 20 MG tablet Take 1 tablet (20 mg total) by mouth daily. 30 tablet 1   . Prenatal MV-Min-FA-Omega-3 (PRENATAL GUMMIES/DHA & FA PO) Take 2 tablets by mouth daily.     . promethazine (PHENERGAN) 12.5 MG suppository Place 1 suppository (12.5 mg total) rectally every 6 (six) hours as needed for nausea or vomiting. 12 each 2     Review of Systems - had similar experience with recent pregnancy ended in pregnancy Loss at 17 wk at [facility].  Vitals:  BP 108/89   Pulse (!) 105   Temp 98.1 F (36.7 C)   Resp 18   Wt 60.5 kg   LMP 06/02/2019   BMI 26.05 kg/m  Physical Examination: CONSTITUTIONAL: Well-developed, well-nourished female in no acute distress.  HENT:  Normocephalic, atraumatic, External right and left ear normal. Oropharynx is clear and moist EYES: Conjunctivae and EOM are normal. Pupils are equal, round, and reactive to light. No scleral icterus.  NECK: Normal range of motion, supple, no masses SKIN: Skin is warm and dry. No rash noted. Not diaphoretic. No erythema. No pallor. [place]: Alert and oriented to person, place, and time. Normal reflexes, muscle tone coordination. No cranial nerve deficit noted. PSYCHIATRIC: Normal mood and affect. Normal behavior. Normal judgment and thought content. CARDIOVASCULAR: Normal heart rate noted, regular rhythm RESPIRATORY: Effort and breath sounds normal, no problems with respiration noted ABDOMEN: Soft, tender to touch , Guarding, no rebound, bowel sounds present, watery but slight diminished frequency, nondistended,  MUSCULOSKELETAL: Normal range of motion. No edema and no tenderness. 2+ distal pulses.  Cervix: Not evaluated. and found to be / / and fetal presentation is . Membranes: intact  Fetal Monitoring:   Labs:  Results for orders placed or performed during the hospital encounter of 08/20/19 (from the past 24 hour(s))  Urinalysis, Routine w reflex microscopic   Collection Time: 08/20/19  5:54  PM  Result Value Ref Range   Color, Urine AMBER (A) YELLOW   APPearance HAZY (A) CLEAR   Specific Gravity, Urine 1.027 1.005 - 1.030   pH 6.0 5.0 - 8.0   Glucose, UA NEGATIVE NEGATIVE mg/dL   Hgb urine dipstick MODERATE (A) NEGATIVE   Bilirubin Urine NEGATIVE NEGATIVE   Ketones, ur 80 (A) NEGATIVE mg/dL   Protein, ur 100 (A) NEGATIVE mg/dL   Nitrite POSITIVE (A) NEGATIVE   Leukocytes,Ua TRACE (A) NEGATIVE   RBC / HPF 0-5 0 - 5 RBC/hpf   WBC, UA 0-5 0 - 5 WBC/hpf   Bacteria, UA RARE (A) NONE SEEN   Squamous Epithelial / LPF 0-5  0 - 5   Mucus PRESENT   Urine rapid drug screen (hosp performed)   Collection Time: 08/20/19  6:26 PM  Result Value Ref Range   Opiates NONE DETECTED NONE DETECTED   Cocaine NONE DETECTED NONE DETECTED   Benzodiazepines NONE DETECTED NONE DETECTED   Amphetamines NONE DETECTED NONE DETECTED   Tetrahydrocannabinol NONE DETECTED NONE DETECTED   Barbiturates NONE DETECTED NONE DETECTED  CBC with Differential   Collection Time: 08/20/19  7:00 PM  Result Value Ref Range   WBC 11.6 (H) 4.0 - 10.5 K/uL   RBC 4.80 3.87 - 5.11 MIL/uL   Hemoglobin 12.4 12.0 - 15.0 g/dL   HCT 37.8 36.0 - 46.0 %   MCV 78.8 (L) 80.0 - 100.0 fL   MCH 25.8 (L) 26.0 - 34.0 pg   MCHC 32.8 30.0 - 36.0 g/dL   RDW 14.3 11.5 - 15.5 %   Platelets 608 (H) 150 - 400 K/uL   nRBC 0.0 0.0 - 0.2 %   Neutrophils Relative % 80 %   Neutro Abs 9.2 (H) 1.7 - 7.7 K/uL   Lymphocytes Relative 13 %   Lymphs Abs 1.6 0.7 - 4.0 K/uL   Monocytes Relative 7 %   Monocytes Absolute 0.8 0.1 - 1.0 K/uL   Eosinophils Relative 0 %   Eosinophils Absolute 0.0 0.0 - 0.5 K/uL   Basophils Relative 0 %   Basophils Absolute 0.0 0.0 - 0.1 K/uL   Immature Granulocytes 0 %   Abs Immature Granulocytes 0.04 0.00 - 0.07 K/uL  Comprehensive metabolic panel   Collection Time: 08/20/19  7:00 PM  Result Value Ref Range   Sodium 134 (L) 135 - 145 mmol/L   Potassium 2.7 (LL) 3.5 - 5.1 mmol/L   Chloride 97 (L) 98 - 111  mmol/L   CO2 24 22 - 32 mmol/L   Glucose, Bld 73 70 - 99 mg/dL   BUN 7 6 - 20 mg/dL   Creatinine, Ser 0.75 0.44 - 1.00 mg/dL   Calcium 8.8 (L) 8.9 - 10.3 mg/dL   Total Protein 7.5 6.5 - 8.1 g/dL   Albumin 3.3 (L) 3.5 - 5.0 g/dL   AST 98 (H) 15 - 41 U/L   ALT 229 (H) 0 - 44 U/L   Alkaline Phosphatase 93 38 - 126 U/L   Total Bilirubin 1.4 (H) 0.3 - 1.2 mg/dL   GFR calc non Af Amer >60 >60 mL/min   GFR calc Af Amer >60 >60 mL/min   Anion gap 13 5 - 15  Amylase   Collection Time: 08/20/19  7:00 PM  Result Value Ref Range   Amylase 290 (H) 28 - 100 U/L  Lipase, blood   Collection Time: 08/20/19  7:00 PM  Result Value Ref Range   Lipase 62 (H) 11 - 51 U/L  T4, free   Collection Time: 08/20/19  7:33 PM  Result Value Ref Range   Free T4 2.19 (H) 0.61 - 1.12 ng/dL    Imaging Studies: US OB LESS THAN 14 WEEKS WITH OB TRANSVAGINAL  Result Date: 08/16/2019 CLINICAL DATA:  Pelvic pain and cramping EXAM: OBSTETRIC <14 WK [name] AND TRANSVAGINAL OB US TECHNIQUE: Both transabdominal and transvaginal ultrasound examinations were performed for complete evaluation of the gestation as well as the maternal uterus, adnexal regions, and pelvic cul-de-sac. Transvaginal technique was performed to assess early pregnancy. COMPARISON:  None. FINDINGS: Intrauterine gestational sac: Present Yolk sac:  Present Embryo:  Present Cardiac Activity: Present Heart Rate: 174 bpm CRL:  43.5 mm   11 w   1 d                  [name] EDC: 03/05/2020 Subchorionic hemorrhage:  None visualized. Maternal uterus/adnexae: Within normal limits. IMPRESSION: Single live intrauterine gestation at 11 weeks 1 day. Follow-up can be performed as clinically indicated. Electronically Signed   By: Inez Catalina M.D.   On: 08/16/2019 18:17     Assessment and Plan: Patient Active Problem List   Diagnosis Date Noted  . Thrombocytosis ([place]) 08/16/2019  . Nausea/vomiting in pregnancy 08/04/2019  . Supervision of high risk pregnancy, antepartum  07/13/2019  . Depression   . Hypokalemia 02/07/2019  . Abnormal LFTs 02/07/2019  . IUFD at less than 20 weeks of gestation 02/06/2019  . Hyperemesis gravidarum 01/15/2019  . Seasonal allergies 10/09/2017  . History of abnormal cervical Pap smear 10/09/2017  . Herpes simplex 01/16/2013   Admit to Antenatal, ABd U/s complete. NPO except Sips and Chips Zofran, phenergan IV, TransDerm Scop K 10 mEq bags x 4 Rehydration Pain management iv prn  Repeat labs in am  Probable GI consult  Jonnie Kind Attending [place], [place] [phone]

## 2019-08-20 NOTE — MAU Note (Signed)
Lab called with potassium level of 2.7. Laury Deep CNM on unit and aware

## 2019-08-20 NOTE — MAU Provider Note (Addendum)
Chief Complaint: Abdominal Pain   First Provider Initiated Contact with Patient 08/20/19 1801     SUBJECTIVE HPI: Jody Robinson is a 29 y.o. G3P1011 at [redacted]w[redacted]d who presents to Maternity Admissions reporting n/v & abdominal pain. Has had n/v with the pregnancy. Abdominal pain since yesterday. Describes as mid & left epigastric soreness. Feels like pain was brought on by taking her protonix. Hasn't been able to take her antiemetics since yesterday due to pain. States she has been vomiting all day long & hasn't been able to eat since yesterday. Tried to drink water & pedialyte today but didn't tolerate it. Removed her scop patch but hasn't gotten prescription filled due to cost.  Denies fever/chills, lower abdominal pain, dysuria, or vaginal bleeding.  In her last pregnancy (01/2019) had a 15 wk loss, hyperemesis, AKI, & elevated LFTs.   Location: upper abdomen Quality: soreness Severity: 10/10 on pain scale Duration: 1 day Timing: constant Modifying factors: worse with meds & vomiting. Worse with touch. Nothing makes better.  Associated signs and symptoms: n/v  Past Medical History:  Diagnosis Date  . Depression   . Headache   . Vaginal Pap smear, abnormal    OB History  Gravida Para Term Preterm AB Living  3 1 1   1 1   SAB TAB Ectopic Multiple Live Births  1       1    # Outcome Date GA Lbr Len/2nd Weight Sex Delivery Anes PTL Lv  3 Current           2 SAB 01/2019 [redacted]w[redacted]d         1 Term 03/08/13 [redacted]w[redacted]d  2495 g  Vag-Spont EPI  LIV     Birth Comments: no complications   Past Surgical History:  Procedure Laterality Date  . COLPOSCOPY     Social History   Socioeconomic History  . Marital status: Single    Spouse name: Not on file  . Number of children: Not on file  . Years of education: Not on file  . Highest education level: Not on file  Occupational History  . Not on file  Tobacco Use  . Smoking status: Never Smoker  . Smokeless tobacco: Never Used  Substance and Sexual  Activity  . Alcohol use: Not Currently    Comment: occasional  . Drug use: No  . Sexual activity: Yes    Birth control/protection: None  Other Topics Concern  . Not on file  Social History Narrative  . Not on file   Social Determinants of Health   Financial Resource Strain:   . Difficulty of Paying Living Expenses: Not on file  Food Insecurity: No Food Insecurity  . Worried About Charity fundraiser in the Last Year: Never true  . Ran Out of Food in the Last Year: Never true  Transportation Needs: No Transportation Needs  . Lack of Transportation (Medical): No  . Lack of Transportation (Non-Medical): No  Physical Activity:   . Days of Exercise per Week: Not on file  . Minutes of Exercise per Session: Not on file  Stress:   . Feeling of Stress : Not on file  Social Connections:   . Frequency of Communication with Friends and Family: Not on file  . Frequency of Social Gatherings with Friends and Family: Not on file  . Attends Religious Services: Not on file  . Active Member of Clubs or Organizations: Not on file  . Attends Archivist Meetings: Not on file  . Marital  Status: Not on file  Intimate Partner Violence:   . Fear of Current or Ex-Partner: Not on file  . Emotionally Abused: Not on file  . Physically Abused: Not on file  . Sexually Abused: Not on file   Family History  Problem Relation Age of Onset  . Diabetes Mother    No current facility-administered medications on file prior to encounter.   Current Outpatient Medications on File Prior to Encounter  Medication Sig Dispense Refill  . docusate sodium (COLACE) 100 MG capsule Take 100 mg by mouth 2 (two) times daily as needed for mild constipation.    . feeding supplement, ENSURE ENLIVE, (ENSURE ENLIVE) LIQD Take 237 mLs by mouth 2 (two) times daily between meals. 237 mL 14  . ondansetron (ZOFRAN ODT) 8 MG disintegrating tablet Take 1 tablet (8 mg total) by mouth every 8 (eight) hours as needed for nausea  or vomiting. 60 tablet 2  . pantoprazole (PROTONIX) 20 MG tablet Take 1 tablet (20 mg total) by mouth daily. 30 tablet 1  . Prenatal MV-Min-FA-Omega-3 (PRENATAL GUMMIES/DHA & FA PO) Take 2 tablets by mouth daily.    . promethazine (PHENERGAN) 12.5 MG suppository Place 1 suppository (12.5 mg total) rectally every 6 (six) hours as needed for nausea or vomiting. 12 each 2   No Known Allergies  I have reviewed patient's Past Medical Hx, Surgical Hx, Family Hx, Social Hx, medications and allergies.   Review of Systems  Constitutional: Negative.   Gastrointestinal: Positive for abdominal pain, nausea and vomiting. Negative for constipation and diarrhea.  Genitourinary: Negative.     OBJECTIVE Patient Vitals for the past 24 hrs:  BP Temp Pulse Resp Weight  08/20/19 1817 -- -- -- -- 60.5 kg  08/20/19 1752 108/89 98.1 F (36.7 C) (!) 105 18 --   Constitutional: Well-developed, well-nourished female in no acute distress.  Cardiovascular: normal rate & rhythm, no murmur Respiratory: normal rate and effort. Lung sounds clear throughout GI: TTP throughout mid to left upper abdomen. Negative murphys signe. Abdomen soft. BS x 4 MS: Extremities nontender, no edema, normal ROM Neurologic: Alert and oriented x 4.     LAB RESULTS Results for orders placed or performed during the hospital encounter of 08/20/19 (from the past 24 hour(s))  Urinalysis, Routine w reflex microscopic     Status: Abnormal   Collection Time: 08/20/19  5:54 PM  Result Value Ref Range   Color, Urine AMBER (A) YELLOW   APPearance HAZY (A) CLEAR   Specific Gravity, Urine 1.027 1.005 - 1.030   pH 6.0 5.0 - 8.0   Glucose, UA NEGATIVE NEGATIVE mg/dL   Hgb urine dipstick MODERATE (A) NEGATIVE   Bilirubin Urine NEGATIVE NEGATIVE   Ketones, ur 80 (A) NEGATIVE mg/dL   Protein, ur 100 (A) NEGATIVE mg/dL   Nitrite POSITIVE (A) NEGATIVE   Leukocytes,Ua TRACE (A) NEGATIVE   RBC / HPF 0-5 0 - 5 RBC/hpf   WBC, UA 0-5 0 - 5 WBC/hpf    Bacteria, UA RARE (A) NONE SEEN   Squamous Epithelial / LPF 0-5 0 - 5   Mucus PRESENT   Urine rapid drug screen (hosp performed)     Status: None   Collection Time: 08/20/19  6:26 PM  Result Value Ref Range   Opiates NONE DETECTED NONE DETECTED   Cocaine NONE DETECTED NONE DETECTED   Benzodiazepines NONE DETECTED NONE DETECTED   Amphetamines NONE DETECTED NONE DETECTED   Tetrahydrocannabinol NONE DETECTED NONE DETECTED   Barbiturates NONE DETECTED  NONE DETECTED  CBC with Differential     Status: Abnormal   Collection Time: 08/20/19  7:00 PM  Result Value Ref Range   WBC 11.6 (H) 4.0 - 10.5 K/uL   RBC 4.80 3.87 - 5.11 MIL/uL   Hemoglobin 12.4 12.0 - 15.0 g/dL   HCT 37.8 36.0 - 46.0 %   MCV 78.8 (L) 80.0 - 100.0 fL   MCH 25.8 (L) 26.0 - 34.0 pg   MCHC 32.8 30.0 - 36.0 g/dL   RDW 14.3 11.5 - 15.5 %   Platelets 608 (H) 150 - 400 K/uL   nRBC 0.0 0.0 - 0.2 %   Neutrophils Relative % 80 %   Neutro Abs 9.2 (H) 1.7 - 7.7 K/uL   Lymphocytes Relative 13 %   Lymphs Abs 1.6 0.7 - 4.0 K/uL   Monocytes Relative 7 %   Monocytes Absolute 0.8 0.1 - 1.0 K/uL   Eosinophils Relative 0 %   Eosinophils Absolute 0.0 0.0 - 0.5 K/uL   Basophils Relative 0 %   Basophils Absolute 0.0 0.0 - 0.1 K/uL   Immature Granulocytes 0 %   Abs Immature Granulocytes 0.04 0.00 - 0.07 K/uL  Comprehensive metabolic panel     Status: Abnormal   Collection Time: 08/20/19  7:00 PM  Result Value Ref Range   Sodium 134 (L) 135 - 145 mmol/L   Potassium 2.7 (LL) 3.5 - 5.1 mmol/L   Chloride 97 (L) 98 - 111 mmol/L   CO2 24 22 - 32 mmol/L   Glucose, Bld 73 70 - 99 mg/dL   BUN 7 6 - 20 mg/dL   Creatinine, Ser 0.75 0.44 - 1.00 mg/dL   Calcium 8.8 (L) 8.9 - 10.3 mg/dL   Total Protein 7.5 6.5 - 8.1 g/dL   Albumin 3.3 (L) 3.5 - 5.0 g/dL   AST 98 (H) 15 - 41 U/L   ALT 229 (H) 0 - 44 U/L   Alkaline Phosphatase 93 38 - 126 U/L   Total Bilirubin 1.4 (H) 0.3 - 1.2 mg/dL   GFR calc non Af Amer >60 >60 mL/min   GFR calc Af  Amer >60 >60 mL/min   Anion gap 13 5 - 15  Amylase     Status: Abnormal   Collection Time: 08/20/19  7:00 PM  Result Value Ref Range   Amylase 290 (H) 28 - 100 U/L  Lipase, blood     Status: Abnormal   Collection Time: 08/20/19  7:00 PM  Result Value Ref Range   Lipase 62 (H) 11 - 51 U/L  T4, free     Status: Abnormal   Collection Time: 08/20/19  7:33 PM  Result Value Ref Range   Free T4 2.19 (H) 0.61 - 1.12 ng/dL    IMAGING No results found.  MAU COURSE Orders Placed This Encounter  Procedures  . Culture, OB Urine  . Urinalysis, Routine w reflex microscopic  . CBC with Differential  . Comprehensive metabolic panel  . Amylase  . Lipase, blood  . Urine rapid drug screen (hosp performed)  . T4, free   Meds ordered this encounter  Medications  . FOLLOWED BY Linked Order Group   . lactated ringers bolus 1,000 mL   . dextrose 5 % and 0.45% NaCl 1,000 mL with thiamine [id number] mg, folic acid 1 mg, multivitamins adult 10 mL infusion  . famotidine (PEPCID) IVPB 20 mg premix  . promethazine (PHENERGAN) injection 25 mg  . scopolamine (TRANSDERM-SCOP) 1 MG/3DAYS 1.5 mg  MDM Patient had labs done at her new ob visit yesterday. This week has elevation in LFTs. Also had abnormal TSH (low) & needs follow up labs for that but unsure if they can be added on to her blood work from yesterday.  Reviewed patient with Dr. Glo Herring, will collect CBC, CMP, amylase, lipase, T3 uptake, & free T4.   U/a indicates dehydration. Will give IV fluids. U/a also shows nitrites. Pt had negative urine culture on Monday & denies urinary complaints today. Will send urine for culture.   Weight stable since Monday (60.78 kg > 60.51 kg).   IV LR bolus followed by banana bag. IV pepcid & phenergan ordered. Scop patch to be reapplied   Care turned over to [place], NP 08/20/2019  7:43 PM  Care assumed by Gavin Pound, CNM @ [address]  [address], [place]  08/20/2019 9:17 PM     2153 Labs return back notable for hypokalemia. Potassium x2 ordered.  LFTs noted to be elevated Dr. Glo Herring contacted and advises admission.  Patient informed of MD recommendation. Admit to OBSCU for Hyperemesis and Pancreatitis  Maryann Conners MSN, [place] Provider, [facility]

## 2019-08-21 ENCOUNTER — Encounter (HOSPITAL_COMMUNITY): Payer: Self-pay | Admitting: Obstetrics and Gynecology

## 2019-08-21 DIAGNOSIS — O26611 Liver and biliary tract disorders in pregnancy, first trimester: Secondary | ICD-10-CM | POA: Diagnosis not present

## 2019-08-21 DIAGNOSIS — R945 Abnormal results of liver function studies: Secondary | ICD-10-CM | POA: Diagnosis not present

## 2019-08-21 DIAGNOSIS — O99281 Endocrine, nutritional and metabolic diseases complicating pregnancy, first trimester: Secondary | ICD-10-CM | POA: Diagnosis not present

## 2019-08-21 DIAGNOSIS — K828 Other specified diseases of gallbladder: Secondary | ICD-10-CM | POA: Diagnosis not present

## 2019-08-21 LAB — COMPREHENSIVE METABOLIC PANEL
ALT: 179 U/L — ABNORMAL HIGH (ref 0–44)
AST: 73 U/L — ABNORMAL HIGH (ref 15–41)
Albumin: 2.6 g/dL — ABNORMAL LOW (ref 3.5–5.0)
Alkaline Phosphatase: 61 U/L (ref 38–126)
Anion gap: 8 (ref 5–15)
BUN: <5 mg/dL — ABNORMAL LOW (ref 6–20)
CO2: 20 mmol/L — ABNORMAL LOW (ref 22–32)
Calcium: 7.8 mg/dL — ABNORMAL LOW (ref 8.9–10.3)
Chloride: 107 mmol/L (ref 98–111)
Creatinine, Ser: 0.62 mg/dL (ref 0.44–1.00)
GFR calc Af Amer: >60 mL/min (ref >60)
GFR calc non Af Amer: >60 mL/min (ref >60)
Glucose, Bld: 206 mg/dL — ABNORMAL HIGH (ref 70–99)
Potassium: 3.4 mmol/L — ABNORMAL LOW (ref 3.5–5.1)
Sodium: 135 mmol/L (ref 135–145)
Total Bilirubin: 1.1 mg/dL (ref 0.3–1.2)
Total Protein: 5.9 g/dL — ABNORMAL LOW (ref 6.5–8.1)

## 2019-08-21 LAB — LIPASE, BLOOD: Lipase: 60 U/L — ABNORMAL HIGH (ref 11–51)

## 2019-08-21 LAB — HEPATITIS PANEL, ACUTE
HCV Ab: NONREACTIVE
Hep A IgM: NONREACTIVE
Hep B C IgM: NONREACTIVE
Hepatitis B Surface Ag: NONREACTIVE

## 2019-08-21 LAB — CBC
HCT: 32.1 % — ABNORMAL LOW (ref 36.0–46.0)
Hemoglobin: 10.4 g/dL — ABNORMAL LOW (ref 12.0–15.0)
MCH: 26.4 pg (ref 26.0–34.0)
MCHC: 32.4 g/dL (ref 30.0–36.0)
MCV: 81.5 fL (ref 80.0–100.0)
Platelets: 529 10*3/uL — ABNORMAL HIGH (ref 150–400)
RBC: 3.94 MIL/uL (ref 3.87–5.11)
RDW: 14.5 % (ref 11.5–15.5)
WBC: 8.6 10*3/uL (ref 4.0–10.5)
nRBC: 0 % (ref 0.0–0.2)

## 2019-08-21 LAB — SARS CORONAVIRUS 2 (TAT 6-24 HRS): SARS Coronavirus 2: NEGATIVE

## 2019-08-21 MED ORDER — BISACODYL 10 MG RE SUPP
10.0000 mg | Freq: Every day | RECTAL | Status: DC | PRN
  Administered 2019-08-21: 10 mg via RECTAL
  Filled 2019-08-21: qty 1

## 2019-08-21 MED ORDER — POTASSIUM CHLORIDE CRYS ER 20 MEQ PO TBCR
30.0000 meq | EXTENDED_RELEASE_TABLET | Freq: Two times a day (BID) | ORAL | Status: DC
  Administered 2019-08-21 – 2019-08-22 (×3): 30 meq via ORAL
  Filled 2019-08-21 (×3): qty 2

## 2019-08-21 MED ORDER — METOCLOPRAMIDE HCL 5 MG/ML IJ SOLN
10.0000 mg | Freq: Four times a day (QID) | INTRAMUSCULAR | Status: DC
  Administered 2019-08-21 – 2019-08-22 (×4): 10 mg via INTRAVENOUS
  Filled 2019-08-21 (×4): qty 2

## 2019-08-21 MED ORDER — POLYETHYLENE GLYCOL 3350 17 G PO PACK
17.0000 g | PACK | Freq: Every day | ORAL | Status: DC | PRN
  Administered 2019-08-21: 17 g via ORAL
  Filled 2019-08-21: qty 1

## 2019-08-21 MED ORDER — FOSFOMYCIN TROMETHAMINE 3 G PO PACK
3.0000 g | PACK | Freq: Once | ORAL | Status: AC
  Administered 2019-08-21: 3 g via ORAL
  Filled 2019-08-21: qty 3

## 2019-08-21 MED ORDER — POLYETHYLENE GLYCOL 3350 17 G PO PACK: 17.0000 g | PACK | Freq: Every day | ORAL | Status: DC

## 2019-08-21 NOTE — Plan of Care (Signed)
Patient without n/v last PM shift. Upon assessment this AM she told RN it has been 2 wks since her last BM. Dr. Glo Herring notified. Orders for suppository and miralax placed. She did not want any intervention for abdominal discomfort this AM (2/10). Will monitor and help her to manage symptoms.

## 2019-08-21 NOTE — Progress Notes (Signed)
Pt c/o pain in left hand where her IV is. Will consult IV team for a new site before beginning runs of K+.

## 2019-08-21 NOTE — Plan of Care (Signed)
  Problem: Education: Goal: Knowledge of General Education information will improve Description: Including pain rating scale, medication(s)/side effects and non-pharmacologic comfort measures Outcome: Completed/Met

## 2019-08-21 NOTE — Plan of Care (Signed)
  Problem: Education: Goal: Knowledge of disease or condition will improve Outcome: Progressing   

## 2019-08-21 NOTE — Progress Notes (Signed)
FACULTY PRACTICE ANTEPARTUM(COMPREHENSIVE) NOTE  Jody Robinson is a 29 y.o. G3P1011 at [redacted]w[redacted]d  who is admitted for pancreatitis, elevated LFT's, with marked N & V, also constipation,with last BM described as :"2 wks ago"  Fetal presentation is n/a. Length of Stay:  1  Days  Subjective: Pt feels subjectively better, did not vomit overnight on sips and chips Patient reports  vaginal bleeding as none. Patient describes fluid per vagina as None.  Vitals:  Blood pressure 99/60, pulse 81, temperature 98.1 F (36.7 C), resp. rate 16, height 5' (1.524 m), weight 61.3 kg, last menstrual period 06/02/2019, SpO2 100 %. Physical Examination:  General appearance - alert, well appearing, and in no distress, oriented to person, place, and time and normal appearing weight Heart - normal rate and regular rhythm Abdomen - soft, slight tender on left side LLQ cannot feel bowel, tender, nondistended no rebound. Fundal Height:   Cervical Exam: Extremities: extremities normal, atraumatic, no cyanosis or edema and Homans sign is negative, no sign of DVT with DTRs 2+ bilaterally Membranes:  Fetal Monitoring:   Labs:  Results for orders placed or performed during the hospital encounter of 08/20/19 (from the past 24 hour(s))  Urinalysis, Routine w reflex microscopic   Collection Time: 08/20/19  5:54 PM  Result Value Ref Range   Color, Urine AMBER (A) YELLOW   APPearance HAZY (A) CLEAR   Specific Gravity, Urine 1.027 1.005 - 1.030   pH 6.0 5.0 - 8.0   Glucose, UA NEGATIVE NEGATIVE mg/dL   Hgb urine dipstick MODERATE (A) NEGATIVE   Bilirubin Urine NEGATIVE NEGATIVE   Ketones, ur 80 (A) NEGATIVE mg/dL   Protein, ur 100 (A) NEGATIVE mg/dL   Nitrite POSITIVE (A) NEGATIVE   Leukocytes,Ua TRACE (A) NEGATIVE   RBC / HPF 0-5 0 - 5 RBC/hpf   WBC, UA 0-5 0 - 5 WBC/hpf   Bacteria, UA RARE (A) NONE SEEN   Squamous Epithelial / LPF 0-5 0 - 5   Mucus PRESENT   Urine rapid drug screen (hosp performed)    Collection Time: 08/20/19  6:26 PM  Result Value Ref Range   Opiates NONE DETECTED NONE DETECTED   Cocaine NONE DETECTED NONE DETECTED   Benzodiazepines NONE DETECTED NONE DETECTED   Amphetamines NONE DETECTED NONE DETECTED   Tetrahydrocannabinol NONE DETECTED NONE DETECTED   Barbiturates NONE DETECTED NONE DETECTED  CBC with Differential   Collection Time: 08/20/19  7:00 PM  Result Value Ref Range   WBC 11.6 (H) 4.0 - 10.5 K/uL   RBC 4.80 3.87 - 5.11 MIL/uL   Hemoglobin 12.4 12.0 - 15.0 g/dL   HCT 37.8 36.0 - 46.0 %   MCV 78.8 (L) 80.0 - 100.0 fL   MCH 25.8 (L) 26.0 - 34.0 pg   MCHC 32.8 30.0 - 36.0 g/dL   RDW 14.3 11.5 - 15.5 %   Platelets 608 (H) 150 - 400 K/uL   nRBC 0.0 0.0 - 0.2 %   Neutrophils Relative % 80 %   Neutro Abs 9.2 (H) 1.7 - 7.7 K/uL   Lymphocytes Relative 13 %   Lymphs Abs 1.6 0.7 - 4.0 K/uL   Monocytes Relative 7 %   Monocytes Absolute 0.8 0.1 - 1.0 K/uL   Eosinophils Relative 0 %   Eosinophils Absolute 0.0 0.0 - 0.5 K/uL   Basophils Relative 0 %   Basophils Absolute 0.0 0.0 - 0.1 K/uL   Immature Granulocytes 0 %   Abs Immature Granulocytes 0.04 0.00 - 0.07 K/uL  Comprehensive metabolic panel   Collection Time: 08/20/19  7:00 PM  Result Value Ref Range   Sodium 134 (L) 135 - 145 mmol/L   Potassium 2.7 (LL) 3.5 - 5.1 mmol/L   Chloride 97 (L) 98 - 111 mmol/L   CO2 24 22 - 32 mmol/L   Glucose, Bld 73 70 - 99 mg/dL   BUN 7 6 - 20 mg/dL   Creatinine, Ser 0.75 0.44 - 1.00 mg/dL   Calcium 8.8 (L) 8.9 - 10.3 mg/dL   Total Protein 7.5 6.5 - 8.1 g/dL   Albumin 3.3 (L) 3.5 - 5.0 g/dL   AST 98 (H) 15 - 41 U/L   ALT 229 (H) 0 - 44 U/L   Alkaline Phosphatase 93 38 - 126 U/L   Total Bilirubin 1.4 (H) 0.3 - 1.2 mg/dL   GFR calc non Af Amer >60 >60 mL/min   GFR calc Af Amer >60 >60 mL/min   Anion gap 13 5 - 15  Amylase   Collection Time: 08/20/19  7:00 PM  Result Value Ref Range   Amylase 290 (H) 28 - 100 U/L  Lipase, blood   Collection Time: 08/20/19   7:00 PM  Result Value Ref Range   Lipase 62 (H) 11 - 51 U/L  T4, free   Collection Time: 08/20/19  7:33 PM  Result Value Ref Range   Free T4 2.19 (H) 0.61 - 1.12 ng/dL  Magnesium   Collection Time: 08/20/19  7:35 PM  Result Value Ref Range   Magnesium 2.0 1.7 - 2.4 mg/dL  SARS CORONAVIRUS 2 (TAT 6-24 HRS) Nasopharyngeal Nasopharyngeal Swab   Collection Time: 08/20/19 10:34 PM   Specimen: Nasopharyngeal Swab  Result Value Ref Range   SARS Coronavirus 2 NEGATIVE NEGATIVE    Imaging Studies:   CLINICAL DATA:  29 year old pregnant female with abdominal pain and vomiting.  EXAM: ABDOMEN ULTRASOUND COMPLETE  COMPARISON:  Right upper quadrant ultrasound dated 01/25/2019.  FINDINGS: Gallbladder: There is small amount of sludge within the gallbladder. No gallbladder wall thickening or pericholecystic fluid. Negative sonographic Murphy's sign.  Common bile duct: Diameter: 4 mm  Liver: The liver is unremarkable as visualized. Portal vein is patent on color Doppler imaging with normal direction of blood flow towards the liver.  IVC: No abnormality visualized.  Pancreas: Visualized portion unremarkable.  Spleen: Size and appearance within normal limits.  Right Kidney: Length: 12 cm. Normal echogenicity. No hydronephrosis or shadowing stone.  Left Kidney: Length: 10 cm. Normal echogenicity. No hydronephrosis or shadowing stone.  Abdominal aorta: No aneurysm visualized.  Other findings: None.  IMPRESSION: Small gallbladder sludge, otherwise unremarkable abdominal ultrasound.   Electronically Signed   By: Anner Crete M.D.   On: 08/21/2019 00:08   CLINICAL DATA:  Pelvic pain and cramping  EXAM: OBSTETRIC <14 WK [name] AND TRANSVAGINAL OB US  TECHNIQUE: Both transabdominal and transvaginal ultrasound examinations were performed for complete evaluation of the gestation as well as the maternal uterus, adnexal regions, and pelvic  cul-de-sac. Transvaginal technique was performed to assess early pregnancy.  COMPARISON:  None.  FINDINGS: Intrauterine gestational sac: Present  Yolk sac:  Present  Embryo:  Present  Cardiac Activity: Present  Heart Rate: 174 bpm  CRL:  43.5 mm   11 w   1 d                  [name] EDC: 03/05/2020  Subchorionic hemorrhage:  None visualized.  Maternal uterus/adnexae: Within normal limits.  IMPRESSION: Single live intrauterine  gestation at 11 weeks 1 day. Follow-up can be performed as clinically indicated.   Electronically Signed   By: Inez Catalina M.D.   On: 08/16/2019 18:17    Medications:  Scheduled . docusate sodium  100 mg Oral Daily  . enoxaparin (LOVENOX) injection  40 mg Subcutaneous Q24H  . polyethylene glycol  17 g Oral Daily  . promethazine  25 mg Intravenous Once  . scopolamine  1 patch Transdermal Q72H   I have reviewed the patient's current medications.  ASSESSMENT: Patient Active Problem List   Diagnosis Date Noted  . Pancreatitis, acute 08/20/2019  . Thrombocytosis ([place]) 08/16/2019  . Nausea/vomiting in pregnancy 08/04/2019  . Supervision of high risk pregnancy, antepartum 07/13/2019  . Depression   . Hypokalemia 02/07/2019  . Abnormal LFTs 02/07/2019  . IUFD at less than 20 weeks of gestation 02/06/2019  . Hyperemesis gravidarum 01/15/2019  . Seasonal allergies 10/09/2017  . History of abnormal cervical Pap smear 10/09/2017  . Herpes simplex 01/16/2013  Constipation  PLAN: Clear liquids this a.m Daily labs today is at [place] 08/21/2019,7:42 AM    Patient ID: [name], female   DOB: November 17, 1990, 29 y.o.   MRN: [id number]

## 2019-08-22 DIAGNOSIS — E876 Hypokalemia: Secondary | ICD-10-CM

## 2019-08-22 DIAGNOSIS — R945 Abnormal results of liver function studies: Secondary | ICD-10-CM | POA: Diagnosis not present

## 2019-08-22 DIAGNOSIS — O99281 Endocrine, nutritional and metabolic diseases complicating pregnancy, first trimester: Secondary | ICD-10-CM

## 2019-08-22 DIAGNOSIS — K859 Acute pancreatitis without necrosis or infection, unspecified: Secondary | ICD-10-CM

## 2019-08-22 DIAGNOSIS — O99611 Diseases of the digestive system complicating pregnancy, first trimester: Secondary | ICD-10-CM

## 2019-08-22 DIAGNOSIS — K828 Other specified diseases of gallbladder: Secondary | ICD-10-CM | POA: Diagnosis not present

## 2019-08-22 DIAGNOSIS — Z3A11 11 weeks gestation of pregnancy: Secondary | ICD-10-CM

## 2019-08-22 DIAGNOSIS — O26611 Liver and biliary tract disorders in pregnancy, first trimester: Secondary | ICD-10-CM | POA: Diagnosis not present

## 2019-08-22 HISTORY — DX: Other specified diseases of gallbladder: K82.8

## 2019-08-22 LAB — CBC
HCT: 30.6 % — ABNORMAL LOW (ref 36.0–46.0)
Hemoglobin: 9.7 g/dL — ABNORMAL LOW (ref 12.0–15.0)
MCH: 26.2 pg (ref 26.0–34.0)
MCHC: 31.7 g/dL (ref 30.0–36.0)
MCV: 82.7 fL (ref 80.0–100.0)
Platelets: 497 10*3/uL — ABNORMAL HIGH (ref 150–400)
RBC: 3.7 MIL/uL — ABNORMAL LOW (ref 3.87–5.11)
RDW: 14.8 % (ref 11.5–15.5)
WBC: 9.8 10*3/uL (ref 4.0–10.5)
nRBC: 0 % (ref 0.0–0.2)

## 2019-08-22 LAB — CULTURE, OB URINE: Culture: >=100000 — AB

## 2019-08-22 LAB — COMPREHENSIVE METABOLIC PANEL
ALT: 129 U/L — ABNORMAL HIGH (ref 0–44)
AST: 38 U/L (ref 15–41)
Albumin: 2.4 g/dL — ABNORMAL LOW (ref 3.5–5.0)
Alkaline Phosphatase: 64 U/L (ref 38–126)
Anion gap: 6 (ref 5–15)
BUN: <5 mg/dL — ABNORMAL LOW (ref 6–20)
CO2: 19 mmol/L — ABNORMAL LOW (ref 22–32)
Calcium: 8 mg/dL — ABNORMAL LOW (ref 8.9–10.3)
Chloride: 110 mmol/L (ref 98–111)
Creatinine, Ser: 0.59 mg/dL (ref 0.44–1.00)
GFR calc Af Amer: >60 mL/min (ref >60)
GFR calc non Af Amer: >60 mL/min (ref >60)
Glucose, Bld: 130 mg/dL — ABNORMAL HIGH (ref 70–99)
Potassium: 3.8 mmol/L (ref 3.5–5.1)
Sodium: 135 mmol/L (ref 135–145)
Total Bilirubin: 0.5 mg/dL (ref 0.3–1.2)
Total Protein: 5.5 g/dL — ABNORMAL LOW (ref 6.5–8.1)

## 2019-08-22 LAB — LIPASE, BLOOD: Lipase: 69 U/L — ABNORMAL HIGH (ref 11–51)

## 2019-08-22 LAB — THYROTROPIN RECEPTOR AUTOABS: Thyrotropin Receptor Ab: <1.1 IU/L (ref 0.00–1.75)

## 2019-08-22 LAB — URINE CULTURE, OB REFLEX

## 2019-08-22 LAB — T3, FREE: T3, Free: 4.1 pg/mL (ref 2.0–4.4)

## 2019-08-22 MED ORDER — OXYCODONE HCL 5 MG PO TABS
5.0000 mg | ORAL_TABLET | Freq: Four times a day (QID) | ORAL | Status: DC | PRN
  Administered 2019-08-22: 03:00:00 10 mg via ORAL
  Filled 2019-08-22: qty 2

## 2019-08-22 MED ORDER — OXYCODONE HCL 5 MG PO TABS: 5.0000 mg | ORAL_TABLET | Freq: Four times a day (QID) | ORAL | 0 refills | Status: DC | PRN

## 2019-08-22 MED ORDER — POTASSIUM CHLORIDE CRYS ER 15 MEQ PO TBCR: 30.0000 meq | EXTENDED_RELEASE_TABLET | Freq: Two times a day (BID) | ORAL | 0 refills | Status: DC

## 2019-08-22 MED ORDER — METOCLOPRAMIDE HCL 10 MG PO TABS: ORAL_TABLET | ORAL | 0 refills | Status: DC

## 2019-08-22 MED ORDER — METOCLOPRAMIDE HCL 10 MG PO TABS
10.0000 mg | ORAL_TABLET | Freq: Four times a day (QID) | ORAL | Status: DC
  Filled 2019-08-22: qty 1

## 2019-08-22 NOTE — Discharge Summary (Signed)
Discharge Summary   Admit Date: 08/20/2019 Discharge Date: 08/22/2019 Discharging Service: Antepartum  Primary OBGYN: [facility] Admitting Physician: Jonnie Kind, MD  Discharge Physician: Rip Harbour  Referring Provider: Maternity Admissions Unit  Primary Care Provider: Patient, No Pcp Per  Admission Diagnoses: *Pregnancy at 11/2 *Nausea, vomiting and abnormal GI labs  Discharge Diagnoses: *Pregnancy at 11/4 *Improved nausea, vomiting and abnormal GI labs *Gallbladder sludge *UTI  Consult Orders: None   Surgeries/Procedures Performed: Abdominal ultrasound  History and Physical:   [facility] ANTEPARTUM ADMISSION HISTORY AND PHYSICAL NOTE        History of Present Illness:  Jody Robinson is a 29 y.o. G3P1011 at [redacted]w[redacted]d admitted for hyperemesis, pancreatitis, with elevated transaminases, and hypokakemia.  She presented to the MAU tonight with recurrence of persistent nausea and vomiting, despite use of Zofran ODT, Phenergan PR, and Trans-Derm Scop patches, along with PO Protonix 20.  Tonight she is found to have marked N & V, abdominal pain left side >Right,  With lab abnormalities of K 2.7, AST 98, ALT 229, Amylase 290 (nl<100), Lipase 62(nl <51), with total Bili elevated at 1.4.   Urine shows 80 Ketones, Nitrites, Protein 100, SG 1.027. Moderate HGB with clear microscopic.  Patient reports  vaginal bleeding as none.  Patient describes fluid per vagina as None.  .          Patient Active Problem List        Diagnosis   Date Noted       Thrombocytosis ([place])   08/16/2019       Nausea/vomiting in pregnancy   08/04/2019       Supervision of high risk pregnancy, antepartum   07/13/2019       Depression           Hypokalemia   02/07/2019       Abnormal LFTs   02/07/2019       IUFD at less than 20 weeks of gestation   02/06/2019       Hyperemesis gravidarum   01/15/2019        Seasonal allergies   10/09/2017       History of abnormal cervical Pap smear   10/09/2017       Herpes simplex   01/16/2013               Past Medical History:    Diagnosis   Date       Depression           Headache           Vaginal Pap smear, abnormal                    Past Surgical History:    Procedure   Laterality   Date       COLPOSCOPY                                   OB History    Gravida   Para   Term   Preterm   AB   Living    3   1   1       1   1     SAB   TAB   Ectopic   Multiple   Live Births    1               1         #  Outcome   Date   GA   Lbr Len/2nd   Weight   Sex   Delivery   Anes   PTL   Lv    3   Current                                        2   SAB   01/2019   [redacted]w[redacted]d                                1   Term   03/08/13   [redacted]w[redacted]d       2495 g       Vag-Spont   EPI       LIV       Birth Comments: no complications           Social History             Socioeconomic History       Marital status:   Single            Spouse name:   Not on file       Number of children:   Not on file       Years of education:   Not on file       Highest education level:   Not on file    Occupational History       Not on file    Tobacco Use       Smoking status:   Never Smoker       Smokeless tobacco:   Never Used    Substance and Sexual Activity       Alcohol use:   Not Currently            Comment: occasional       Drug use:   No       Sexual activity:   Yes            Birth control/protection:   None    Other Topics   Concern       Not on file    Social History Narrative       Not on file        Social Determinants of Health            Financial Resource Strain:        Difficulty of Paying Living Expenses: Not on file    Food Insecurity: No Food Insecurity       Worried About Running Out of Food in the Last Year: Never true       Ran Out of Food in the Last Year: Never true    Transportation Needs: No Transportation Needs       Lack of Transportation (Medical): No       Lack of Transportation (Non-Medical): No    Physical Activity:        Days of Exercise per Week: Not on file       Minutes of Exercise per Session: Not on file    Stress:        Feeling of Stress : Not on file    Social Connections:        Frequency of Communication with Friends and Family: Not on file       Frequency of Social Gatherings with  Friends and Family: Not on file       Attends Religious Services: Not on file       Active Member of Clubs or Organizations: Not on file       Attends Archivist Meetings: Not on file       Marital Status: Not on file                Family History    Problem   Relation   Age of Onset       Diabetes   Mother              No Known Allergies             Medications Prior to Admission    Medication   Sig   Dispense   Refill   Last Dose       docusate sodium (COLACE) 100 MG capsule   Take 100 mg by mouth 2 (two) times daily as needed for mild constipation.                   feeding supplement, ENSURE ENLIVE, (ENSURE ENLIVE) LIQD   Take 237 mLs by mouth 2 (two) times daily between meals.   237 mL   14           ondansetron (ZOFRAN ODT) 8 MG disintegrating tablet   Take 1 tablet (8 mg total) by mouth every 8 (eight) hours as needed for nausea or vomiting.   60 tablet   2           pantoprazole (PROTONIX) 20 MG tablet   Take 1 tablet (20 mg total) by mouth daily.   30 tablet   1           Prenatal MV-Min-FA-Omega-3 (PRENATAL GUMMIES/DHA & FA PO)    Take 2 tablets by mouth daily.                   promethazine (PHENERGAN) 12.5 MG suppository   Place 1 suppository (12.5 mg total) rectally every 6 (six) hours as needed for nausea or vomiting.   12 each   2              Review of Systems - had similar experience with recent pregnancy ended in pregnancy Loss at 17 wk at [facility].     Vitals:  BP 108/89   Pulse (!) 105   Temp 98.1 F (36.7 C)   Resp 18   Wt 60.5 kg   LMP 06/02/2019   BMI 26.05 kg/m   Physical Examination:  CONSTITUTIONAL: Well-developed, well-nourished female in no acute distress.   HENT:  Normocephalic, atraumatic, External right and left ear normal. Oropharynx is clear and moist  EYES: Conjunctivae and EOM are normal. Pupils are equal, round, and reactive to light. No scleral icterus.   NECK: Normal range of motion, supple, no masses  SKIN: Skin is warm and dry. No rash noted. Not diaphoretic. No erythema. No pallor.  [place]: Alert and oriented to person, place, and time. Normal reflexes, muscle tone coordination. No cranial nerve deficit noted.  PSYCHIATRIC: Normal mood and affect. Normal behavior. Normal judgment and thought content.  CARDIOVASCULAR: Normal heart rate noted, regular rhythm  RESPIRATORY: Effort and breath sounds normal, no problems with respiration noted  ABDOMEN: Soft, tender to touch , Guarding, no rebound, bowel sounds present, watery but slight diminished frequency, nondistended,   MUSCULOSKELETAL: Normal range of motion. No edema and no  tenderness. 2+ distal pulses.     Cervix: Not evaluated. and found to be / / and fetal presentation is .  Membranes: intact   Fetal Monitoring:        Labs:         Results for orders placed or performed during the hospital encounter of 08/20/19 (from the past 24 hour(s))    Urinalysis, Routine w reflex microscopic        Collection Time: 08/20/19  5:54 PM    Result   Value   Ref  Range        Color, Urine   AMBER (A)   YELLOW        APPearance   HAZY (A)   CLEAR        Specific Gravity, Urine   1.027   1.005 - 1.030        pH   6.0   5.0 - 8.0        Glucose, UA   NEGATIVE   NEGATIVE mg/dL        Hgb urine dipstick   MODERATE (A)   NEGATIVE        Bilirubin Urine   NEGATIVE   NEGATIVE        Ketones, ur   80 (A)   NEGATIVE mg/dL        Protein, ur   100 (A)   NEGATIVE mg/dL        Nitrite   POSITIVE (A)   NEGATIVE        Leukocytes,Ua   TRACE (A)   NEGATIVE        RBC / HPF   0-5   0 - 5 RBC/hpf        WBC, UA   0-5   0 - 5 WBC/hpf        Bacteria, UA   RARE (A)   NONE SEEN        Squamous Epithelial / LPF   0-5   0 - 5        Mucus   PRESENT        Urine rapid drug screen (hosp performed)        Collection Time: 08/20/19  6:26 PM    Result   Value   Ref Range        Opiates   NONE DETECTED   NONE DETECTED        Cocaine   NONE DETECTED   NONE DETECTED        Benzodiazepines   NONE DETECTED   NONE DETECTED        Amphetamines   NONE DETECTED   NONE DETECTED        Tetrahydrocannabinol   NONE DETECTED   NONE DETECTED        Barbiturates   NONE DETECTED   NONE DETECTED    CBC with Differential        Collection Time: 08/20/19  7:00 PM    Result   Value   Ref Range        WBC   11.6 (H)   4.0 - 10.5 K/uL        RBC   4.80   3.87 - 5.11 MIL/uL        Hemoglobin   12.4   12.0 - 15.0 g/dL        HCT   37.8   36.0 - 46.0 %        MCV   78.8 (L)  80.0 - 100.0 fL        MCH   25.8 (L)   26.0 - 34.0 pg        MCHC   32.8   30.0 - 36.0 g/dL        RDW   14.3   11.5 - 15.5 %        Platelets   608 (H)   150 - 400 K/uL        nRBC   0.0   0.0 - 0.2 %        Neutrophils Relative %   80    %        Neutro Abs   9.2 (H)   1.7 - 7.7 K/uL        Lymphocytes Relative   13   %        Lymphs Abs   1.6   0.7 - 4.0 K/uL        Monocytes Relative   7   %        Monocytes Absolute   0.8   0.1 - 1.0 K/uL        Eosinophils Relative   0   %        Eosinophils Absolute   0.0   0.0 - 0.5 K/uL        Basophils Relative   0   %        Basophils Absolute   0.0   0.0 - 0.1 K/uL        Immature Granulocytes   0   %        Abs Immature Granulocytes   0.04   0.00 - 0.07 K/uL    Comprehensive metabolic panel        Collection Time: 08/20/19  7:00 PM    Result   Value   Ref Range        Sodium   134 (L)   135 - 145 mmol/L        Potassium   2.7 (LL)   3.5 - 5.1 mmol/L        Chloride   97 (L)   98 - 111 mmol/L        CO2   24   22 - 32 mmol/L        Glucose, Bld   73   70 - 99 mg/dL        BUN   7   6 - 20 mg/dL        Creatinine, Ser   0.75   0.44 - 1.00 mg/dL        Calcium   8.8 (L)   8.9 - 10.3 mg/dL        Total Protein   7.5   6.5 - 8.1 g/dL        Albumin   3.3 (L)   3.5 - 5.0 g/dL        AST   98 (H)   15 - 41 U/L        ALT   229 (H)   0 - 44 U/L        Alkaline Phosphatase   93   38 - 126 U/L        Total Bilirubin   1.4 (H)   0.3 - 1.2 mg/dL        GFR calc non Af Amer   >60   >60 mL/min  GFR calc Af Amer   >60   >60 mL/min        Anion gap   13   5 - 15    Amylase        Collection Time: 08/20/19  7:00 PM    Result   Value   Ref Range        Amylase   290 (H)   28 - 100 U/L    Lipase, blood        Collection Time: 08/20/19  7:00 PM    Result   Value   Ref Range        Lipase   62 (H)   11 - 51 U/L    T4, free        Collection Time: 08/20/19  7:33 PM    Result   Value   Ref  Range        Free T4   2.19 (H)   0.61 - 1.12 ng/dL          Imaging Studies:    Imaging Results    US OB LESS THAN 14 WEEKS WITH OB TRANSVAGINAL     Result Date: 08/16/2019  CLINICAL DATA:  Pelvic pain and cramping EXAM: OBSTETRIC <14 WK [name] AND TRANSVAGINAL OB US TECHNIQUE: Both transabdominal and transvaginal ultrasound examinations were performed for complete evaluation of the gestation as well as the maternal uterus, adnexal regions, and pelvic cul-de-sac. Transvaginal technique was performed to assess early pregnancy. COMPARISON:  None. FINDINGS: Intrauterine gestational sac: Present Yolk sac:  Present Embryo:  Present Cardiac Activity: Present Heart Rate: 174 bpm CRL:  43.5 mm   11 w   1 d                  [name] EDC: 03/05/2020 Subchorionic hemorrhage:  None visualized. Maternal uterus/adnexae: Within normal limits. IMPRESSION: Single live intrauterine gestation at 11 weeks 1 day. Follow-up can be performed as clinically indicated. Electronically Signed   By: Inez Catalina M.D.   On: 08/16/2019 18:17              Assessment and Plan:       Patient Active Problem List        Diagnosis   Date Noted       Thrombocytosis ([place])   08/16/2019       Nausea/vomiting in pregnancy   08/04/2019       Supervision of high risk pregnancy, antepartum   07/13/2019       Depression           Hypokalemia   02/07/2019       Abnormal LFTs   02/07/2019       IUFD at less than 20 weeks of gestation   02/06/2019       Hyperemesis gravidarum   01/15/2019       Seasonal allergies   10/09/2017       History of abnormal cervical Pap smear   10/09/2017       Herpes simplex   01/16/2013       Admit to Antenatal, ABd U/s complete.  NPO except Sips and Chips  Zofran, phenergan IV, TransDerm Scop  K 10 mEq bags x 4  Rehydration  Pain management iv prn   Repeat labs in am   Probable GI consult     Jonnie Kind  Attending [place], [place]  [phone]  Electronically signed by Jonnie Kind, MD at 08/20/2019 10:35 PM  Hospital Course: *Pregnancy: routine care. Normal FHR *Preterm: no issues *FEN/GI: continue on low fat diet and nausea regimen. Repeat labs next week *UTI: s/p fosfomycin tx. toc in 4-6wks. F/u susceptibilities.  *PPx: lovenox, oob ad lid *FEN/GI: continue with low fat diet. Labs trending in the right direction and s/s improving. SLIV and switch to scheduled PO reglan *Dispo: if does okay with lunch then okay for d/c to home. I told her I recommend low fat diet on discharge.  Discharge Exam:    Current Vital Signs 24h Vital Sign Ranges  T 97.9 F (36.6 C) Temp  Avg: 98 F (36.7 C)  Min: 97.9 F (36.6 C)  Max: 98.3 F (36.8 C)  BP 107/67 BP  Min: 97/52  Max: 114/63  HR 80 Pulse  Avg: 84.8  Min: 79  Max: 94  RR 18 Resp  Avg: 17.7  Min: 17  Max: 18  SaO2 100 % Room Air SpO2  Avg: 99.5 %  Min: 99 %  Max: 100 %       24 Hour I/O Current Shift I/O  Time Ins Outs 01/09 0701 - 01/10 0700 In: 4871.9 [P.O.:1905; I.V.:2916.7] Out: 2750 [Urine:2750] No intake/output data recorded.   General: Well nourished, well developed female in no acute distress. Abdomen: soft, nd, nttp Respiratory: no respiratory distress Skin: Warm and dry.          Recent Labs  Lab 08/20/19 1900 08/21/19 0951 08/22/19 0607  WBC 11.6* 8.6 9.8  HGB 12.4 10.4* 9.7*  HCT 37.8 32.1* 30.6*  PLT 608* 529* 497*        Last Labs        Recent Labs  Lab 08/20/19 1900 08/21/19 0951 08/22/19 0607  NA 134* 135 135  K 2.7* 3.4* 3.8  CL 97* 107 110  CO2 24 20* 19*  BUN 7 <5* <5*  CREATININE 0.75 0.62 0.59  CALCIUM 8.8* 7.8* 8.0*  PROT 7.5 5.9* 5.5*  BILITOT 1.4* 1.1 0.5  ALKPHOS 93 61 64  ALT 229* 179* 129*  AST 98* 73* 38  GLUCOSE 73 206* 130*      Results for SAMRA, CLIFFT (MRN [id number]) as  of 08/22/2019 07:46   Ref. Range 08/20/2019 19:35 08/20/2019 22:34 08/20/2019 23:54 08/21/2019 09:51 08/22/2019 06:07  Lipase Latest Ref Range: 11 - 51 U/L       60 (H) 69 (H)    Negative: acute hepatitis panel, fT3 Pending: TSH receptor antibodies, UCx (+100k e. Faecalis)   Discharge Disposition:  Home  Patient Instructions:  Low fat diet Avoiding tylenol Gallbladder precautions   Results Pending at Discharge:  Urine culture final results  Discharge Medications: Allergies as of 08/22/2019   No Known Allergies      Medication List     TAKE these medications    docusate sodium 100 MG capsule Commonly known as: COLACE Take 100 mg by mouth 2 (two) times daily as needed for mild constipation.   feeding supplement (ENSURE ENLIVE) Liqd Take 237 mLs by mouth 2 (two) times daily between meals.   metoCLOPramide 10 MG tablet Commonly known as: REGLAN 1 tab po q6h x 72 hours and then 1 tab po q6h prn n/v   ondansetron 8 MG disintegrating tablet Commonly known as: Zofran ODT Take 1 tablet (8 mg total) by mouth every 8 (eight) hours as needed for nausea or vomiting.   oxyCODONE 5 MG immediate release tablet Commonly  known as: Oxy IR/ROXICODONE Take 1 tablet (5 mg total) by mouth every 6 (six) hours as needed for severe pain.   pantoprazole 20 MG tablet Commonly known as: Protonix Take 1 tablet (20 mg total) by mouth daily.   potassium chloride SA 15 MEQ tablet Commonly known as: KLOR-CON M15 Take 2 tablets (30 mEq total) by mouth 2 (two) times daily.   PRENATAL GUMMIES/DHA & FA PO Take 2 tablets by mouth daily.   promethazine 12.5 MG suppository Commonly known as: Phenergan Place 1 suppository (12.5 mg total) rectally every 6 (six) hours as needed for nausea or vomiting.         Future Appointments  Date Time Provider [place]  08/25/2019  1:15 PM Sloan Leiter, MD [facility]  10/12/2019  2:30 PM [name] 1 [facility]-M[facility] M[facility]-US    C. Charrisse Masley  MD Attending [facility] [employer] [facility])

## 2019-08-22 NOTE — Discharge Instructions (Signed)
Eat a low fat diet Avoid tylenol   Biliary Colic, Adult  Biliary colic is severe pain caused by a problem with a small organ in the upper right part of your belly (gallbladder). The gallbladder stores a digestive fluid produced in the liver (bile) that helps the body break down fat. Bile and other digestive enzymes are carried from the liver to the small intestine through tube-like structures (bile ducts). The gallbladder and the bile ducts form the biliary tract. Sometimes hard deposits of digestive fluids form in the gallbladder (gallstones) and block the flow of bile from the gallbladder, causing biliary colic. This condition is also called a gallbladder attack. Gallstones can be as small as a grain of sand or as big as a golf ball. There could be just one gallstone in the gallbladder, or there could be many. What are the causes? Biliary colic is usually caused by gallstones. Less often, a tumor could block the flow of bile from the gallbladder and trigger biliary colic. What increases the risk? This condition is more likely to develop in:  Women.  People of Hispanic descent.  People with a family history of gallstones.  People who are obese.  People who suddenly or quickly lose weight.  People who eat a high-calorie, low-fiber diet that is rich in refined carbs (carbohydrates), such as white bread and white rice.  People who have an intestinal disease that affects nutrient absorption, such as Crohn disease.  People who have a metabolic condition, such as metabolic syndrome or diabetes. What are the signs or symptoms? Severe pain in the upper right side of the belly is the main symptom of biliary colic. You may feel this pain below the chest but above the hip. This pain often occurs at night or after eating a very fatty meal. This pain may get worse for up to an hour and last as long as 12 hours. In most cases, the pain fades (subsides) within a couple hours. Other symptoms of this  condition include:  Nausea and vomiting.  Pain under the right shoulder. How is this diagnosed? This condition is diagnosed based on your medical history, your symptoms, and a physical exam. You may have tests, including:  Blood tests to rule out infection or inflammation of the bile ducts, gallbladder, pancreas, or liver.  Imaging studies such as: ? Ultrasound. ? CT scan. ? MRI. In some cases, you may need to have an imaging study done using a small amount of radioactive material (nuclear medicine) to confirm the diagnosis. How is this treated? Treatment for this condition may include medicine to relieve your pain or nausea. If you have gallstones that are causing biliary colic, you may need surgery to remove the gallbladder (cholecystectomy). Gallstones can also be dissolved gradually with medicine. It may take months or years before the gallstones are completely gone. Follow these instructions at home:  Take over-the-counter and prescription medicines only as told by your health care provider.  Drink enough fluid to keep your urine clear or pale yellow.  Follow instructions from your health care provider about eating or drinking restrictions. These may include avoiding: ? Fatty, greasy, and fried foods. ? Any foods that make the pain worse. ? Overeating. ? Having a large meal after not eating for a while.  Keep all follow-up visits as told by your health care provider. This is important. How is this prevented? Steps to prevent this condition include:  Maintaining a healthy body weight.  Getting regular exercise.  Eating  a healthy, high-fiber, low-fat diet.  Limiting how much sugar and refined carbs you eat, such as sweets, white flour, and white rice. Contact a health care provider if:  Your pain lasts more than 5 hours.  You vomit.  You have a fever and chills.  Your pain gets worse. Get help right away if:  Your skin or the whites of your eyes look yellow  (jaundice).  Your have tea-colored urine and light-colored stools.  You are dizzy or you faint. Summary  Biliary colic is severe pain caused by a problem with a small organ in the upper right part of your belly (gallbladder).  Treatments for this condition include medicines that relieves your pain or nausea and medicines that slowly dissolves the gallstones.  If gallstones cause your biliary colic, the treatment is surgery to remove the gallbladder (cholecystectomy). This information is not intended to replace advice given to you by your health care provider. Make sure you discuss any questions you have with your health care provider. Document Revised: 07/11/2017 Document Reviewed: 02/12/2016 Elsevier Patient Education  [place].

## 2019-08-22 NOTE — Progress Notes (Addendum)
Daily Antepartum Note  Admission Date: 08/20/2019 Current Date: 08/22/2019 7:44 AM  Jody Robinson is a 29 y.o. G3P1011 @ [redacted]w[redacted]d by 11wk u/s, HD#3, admitted for n/v, UTI and abnormal GI labs.  Pregnancy complicated by: Patient Active Problem List   Diagnosis Date Noted  . Pancreatitis 08/21/2019  . Pancreatitis, acute 08/20/2019  . Thrombocytosis ([place]) 08/16/2019  . Nausea/vomiting in pregnancy 08/04/2019  . Supervision of high risk pregnancy, antepartum 07/13/2019  . Depression   . Hypokalemia 02/07/2019  . Abnormal LFTs 02/07/2019  . IUFD at less than 20 weeks of gestation 02/06/2019  . Hyperemesis gravidarum 01/15/2019  . Seasonal allergies 10/09/2017  . History of abnormal cervical Pap smear 10/09/2017  . Herpes simplex 01/16/2013    Overnight/24hr events:  None  Subjective:  Patient with some nausea overnight (better than previously) but no vomiting. Also still with some epigastric pain but much improved and taking good PO of low fat diet. No OB issues.   Objective:    Current Vital Signs 24h Vital Sign Ranges  T 97.9 F (36.6 C) Temp  Avg: 98 F (36.7 C)  Min: 97.9 F (36.6 C)  Max: 98.3 F (36.8 C)  BP 107/67 BP  Min: 97/52  Max: 114/63  HR 80 Pulse  Avg: 84.8  Min: 79  Max: 94  RR 18 Resp  Avg: 17.7  Min: 17  Max: 18  SaO2 100 % Room Air SpO2  Avg: 99.5 %  Min: 99 %  Max: 100 %       24 Hour I/O Current Shift I/O  Time Ins Outs 01/09 0701 - 01/10 0700 In: 4871.9 [P.O.:1905; I.V.:2916.7] Out: 2750 [Urine:2750] No intake/output data recorded.    Physical exam: General: Well nourished, well developed female in no acute distress. Abdomen: soft, nd, nttp Respiratory: no respiratory distress Skin: Warm and dry.   Medications: Current Facility-Administered Medications  Medication Dose Route Frequency Provider Last Rate Last Admin  . bisacodyl (DULCOLAX) suppository 10 mg  10 mg Rectal Daily PRN Jonnie Kind, MD   10 mg at 08/21/19 0931  . dextrose 5 %  and 0.9 % NaCl with KCl 20 mEq/L infusion   Intravenous Continuous Jonnie Kind, MD 150 mL/hr at 08/22/19 0433 New Bag at 08/22/19 0433  . enoxaparin (LOVENOX) injection 40 mg  40 mg Subcutaneous Q24H Jonnie Kind, MD   40 mg at 08/21/19 2351  . famotidine (PEPCID) IVPB 20 mg premix  20 mg Intravenous Daily Jonnie Kind, MD   Stopped at 08/21/19 1004  . metoCLOPramide (REGLAN) injection 10 mg  10 mg Intravenous Q6H Aletha Halim, MD   10 mg at 08/22/19 0617  . ondansetron (ZOFRAN-ODT) disintegrating tablet 4-8 mg  4-8 mg Oral Q8H PRN Jonnie Kind, MD       Or  . ondansetron [facility]) injection 4 mg  4 mg Intravenous Q8H PRN Jonnie Kind, MD       Or  . ondansetron (ZOFRAN) 8 mg in sodium chloride 0.9 % 50 mL IVPB  8 mg Intravenous Q8H PRN Jonnie Kind, MD      . oxyCODONE (Oxy IR/ROXICODONE) immediate release tablet 5-10 mg  5-10 mg Oral Q6H PRN Aletha Halim, MD   10 mg at 08/22/19 0231  . polyethylene glycol (MIRALAX / GLYCOLAX) packet 17 g  17 g Oral Daily PRN Aletha Halim, MD   17 g at 08/21/19 1221  . potassium chloride SA (KLOR-CON) CR tablet 30 mEq  30 mEq  Oral BID Aletha Halim, MD   30 mEq at 08/21/19 2157  . promethazine (PHENERGAN) injection 25 mg  25 mg Intravenous Once Jorje Guild, NP      . promethazine (PHENERGAN) tablet 12.5-25 mg  12.5-25 mg Oral Q4H PRN Jonnie Kind, MD       Or  . promethazine (PHENERGAN) suppository 12.5-25 mg  12.5-25 mg Rectal Q4H PRN Jonnie Kind, MD      . scopolamine (TRANSDERM-SCOP) 1 MG/3DAYS 1.5 mg  1 patch Transdermal Q72H Jorje Guild, NP   1.5 mg at 08/20/19 1954  . zolpidem (AMBIEN) tablet 5 mg  5 mg Oral QHS PRN Jonnie Kind, MD        Labs:  Recent Labs  Lab 08/20/19 1900 08/21/19 0951 08/22/19 0607  WBC 11.6* 8.6 9.8  HGB 12.4 10.4* 9.7*  HCT 37.8 32.1* 30.6*  PLT 608* 529* 497*    Recent Labs  Lab 08/20/19 1900 08/21/19 0951 08/22/19 0607  NA 134* 135 135  K 2.7* 3.4* 3.8   CL 97* 107 110  CO2 24 20* 19*  BUN 7 <5* <5*  CREATININE 0.75 0.62 0.59  CALCIUM 8.8* 7.8* 8.0*  PROT 7.5 5.9* 5.5*  BILITOT 1.4* 1.1 0.5  ALKPHOS 93 61 64  ALT 229* 179* 129*  AST 98* 73* 38  GLUCOSE 73 206* 130*   Results for Jody Robinson (MRN [id number]) as of 08/22/2019 07:46  Ref. Range 08/20/2019 19:35 08/20/2019 22:34 08/20/2019 23:54 08/21/2019 09:51 08/22/2019 06:07  Lipase Latest Ref Range: 11 - 51 U/L    60 (H) 69 (H)   Negative: acute hepatitis panel, fT3 Pending: TSH receptor antibodies, UCx (+100k e. Faecalis)  Radiology: no new imaging  Assessment & Plan:  Pt doing well *Pregnancy: routine care. Normal FHR *Preterm: no issues *FEN/GI: continue low fat and medication regimen. Rpt labs next week *UTI: s/p fosfomycin tx. toc in 4-6wks. F/u susceptibilities.  *PPx: lovenox, oob ad lid *FEN/GI: continue with low fat diet. Labs trending in the right direction and s/s improving. SLIV and switch to scheduled PO reglan *Dispo: if does okay with lunch then okay for d/c to home. I told her I recommend low fat diet on discharge.   Durene Romans MD Attending Center for [facility])

## 2019-08-23 ENCOUNTER — Telehealth: Payer: Self-pay | Admitting: Student

## 2019-08-23 ENCOUNTER — Encounter: Payer: 59 | Admitting: Women's Health

## 2019-08-23 NOTE — Telephone Encounter (Signed)
Rescheduled the patient's appointment per the request of Dr. Rip Harbour. The patient verbalized understanding.

## 2019-08-25 ENCOUNTER — Ambulatory Visit (INDEPENDENT_AMBULATORY_CARE_PROVIDER_SITE_OTHER): Payer: 59 | Admitting: Obstetrics and Gynecology

## 2019-08-25 ENCOUNTER — Encounter: Payer: Self-pay | Admitting: Obstetrics and Gynecology

## 2019-08-25 ENCOUNTER — Encounter: Payer: Self-pay | Admitting: Family Medicine

## 2019-08-25 ENCOUNTER — Telehealth: Payer: 59 | Admitting: Obstetrics and Gynecology

## 2019-08-25 ENCOUNTER — Other Ambulatory Visit: Payer: Self-pay

## 2019-08-25 VITALS — BP 114/75 | HR 101 | Wt 143.2 lb

## 2019-08-25 DIAGNOSIS — O0991 Supervision of high risk pregnancy, unspecified, first trimester: Secondary | ICD-10-CM

## 2019-08-25 DIAGNOSIS — O021 Missed abortion: Secondary | ICD-10-CM

## 2019-08-25 DIAGNOSIS — O210 Mild hyperemesis gravidarum: Secondary | ICD-10-CM

## 2019-08-25 DIAGNOSIS — D473 Essential (hemorrhagic) thrombocythemia: Secondary | ICD-10-CM

## 2019-08-25 DIAGNOSIS — R945 Abnormal results of liver function studies: Secondary | ICD-10-CM

## 2019-08-25 DIAGNOSIS — R7989 Other specified abnormal findings of blood chemistry: Secondary | ICD-10-CM

## 2019-08-25 DIAGNOSIS — D75839 Thrombocytosis, unspecified: Secondary | ICD-10-CM

## 2019-08-25 DIAGNOSIS — O0990 Supervision of high risk pregnancy, unspecified, unspecified trimester: Secondary | ICD-10-CM

## 2019-08-25 DIAGNOSIS — Z3A12 12 weeks gestation of pregnancy: Secondary | ICD-10-CM

## 2019-08-25 DIAGNOSIS — K859 Acute pancreatitis without necrosis or infection, unspecified: Secondary | ICD-10-CM

## 2019-08-25 NOTE — Patient Instructions (Signed)

## 2019-08-25 NOTE — Progress Notes (Signed)
Subjective:  Jody Robinson is a 29 y.o. G3P1011 at 45w0dbeing seen today for ongoing prenatal care.  She is currently monitored for the following issues for this high-risk pregnancy and has IUFD at less than 20 weeks of gestation; Abnormal LFTs; Supervision of high risk pregnancy, antepartum; Depression; Nausea/vomiting in pregnancy; Hyperemesis gravidarum; Seasonal allergies; Herpes simplex; History of abnormal cervical Pap smear; Thrombocytosis ([place]; Pancreatitis, acute; and Gallbladder sludge on their problem list.  Patient reports no complaints.  Contractions: Not present. Vag. Bleeding: None.  Movement: Present. Denies leaking of fluid.   The following portions of the patient's history were reviewed and updated as appropriate: allergies, current medications, past family history, past medical history, past social history, past surgical history and problem list. Problem list updated.  Objective:   Vitals:   08/25/19 1046  BP: 114/75  Pulse: (!) 101  Weight: 143 lb 3.2 oz (65 kg)    Fetal Status: Fetal Heart Rate (bpm): 166   Movement: Present     General:  Alert, oriented and cooperative. Patient is in no acute distress.  Skin: Skin is warm and dry. No rash noted.   Cardiovascular: Normal heart rate noted  Respiratory: Normal respiratory effort, no problems with respiration noted  Abdomen: Soft, gravid, appropriate for gestational age. Pain/Pressure: Absent     Pelvic:  Cervical exam deferred        Extremities: Normal range of motion.  Edema: None  Mental Status: Normal mood and affect. Normal behavior. Normal judgment and thought content.   Urinalysis:      Assessment and Plan:  Pregnancy: G3P1011 at 169w0d1. Supervision of high risk pregnancy, antepartum Stable  2. Acute pancreatitis, unspecified complication status, unspecified pancreatitis type Feels much better. Tolerating low fat diet. Will check labs today Return to work note for 09/06/19, provided to pt -  CBC - Comp Met (CMET) - Lipase  3. Hyperemesis gravidarum See above  4. IUFD at less than 20 weeks of gestation Stable  5. Abnormal LFTs See above  6. Thrombocytosis ([facility]) Checking CBC today  Preterm labor symptoms and general obstetric precautions including but not limited to vaginal bleeding, contractions, leaking of fluid and fetal movement were reviewed in detail with the patient. Please refer to After Visit Summary for other counseling recommendations.  Return in about 2 weeks (around 09/08/2019) for OB visit, face to face, MD provider.   ErChancy MilroyMD

## 2019-08-26 LAB — COMPREHENSIVE METABOLIC PANEL
ALT: 60 IU/L — ABNORMAL HIGH (ref 0–32)
AST: 18 IU/L (ref 0–40)
Albumin/Globulin Ratio: 1.3 (ref 1.2–2.2)
Albumin: 3.3 g/dL — ABNORMAL LOW (ref 3.9–5.0)
Alkaline Phosphatase: 100 IU/L (ref 39–117)
BUN/Creatinine Ratio: 8 — ABNORMAL LOW (ref 9–23)
BUN: 4 mg/dL — ABNORMAL LOW (ref 6–20)
Bilirubin Total: 0.3 mg/dL (ref 0.0–1.2)
CO2: 21 mmol/L (ref 20–29)
Calcium: 9 mg/dL (ref 8.7–10.2)
Chloride: 103 mmol/L (ref 96–106)
Creatinine, Ser: 0.53 mg/dL — ABNORMAL LOW (ref 0.57–1.00)
GFR calc Af Amer: 148 mL/min/{1.73_m2} (ref >59)
GFR calc non Af Amer: 129 mL/min/{1.73_m2} (ref >59)
Globulin, Total: 2.6 g/dL (ref 1.5–4.5)
Glucose: 99 mg/dL (ref 65–99)
Potassium: 4.6 mmol/L (ref 3.5–5.2)
Sodium: 139 mmol/L (ref 134–144)
Total Protein: 5.9 g/dL — ABNORMAL LOW (ref 6.0–8.5)

## 2019-08-26 LAB — LIPASE: Lipase: 78 U/L — ABNORMAL HIGH (ref 14–72)

## 2019-08-26 LAB — CBC
Hematocrit: 30.5 % — ABNORMAL LOW (ref 34.0–46.6)
Hemoglobin: 10 g/dL — ABNORMAL LOW (ref 11.1–15.9)
MCH: 26.7 pg (ref 26.6–33.0)
MCHC: 32.8 g/dL (ref 31.5–35.7)
MCV: 82 fL (ref 79–97)
Platelets: 518 10*3/uL — ABNORMAL HIGH (ref 150–450)
RBC: 3.74 x10E6/uL — ABNORMAL LOW (ref 3.77–5.28)
RDW: 14.9 % (ref 11.7–15.4)
WBC: 12.4 10*3/uL — ABNORMAL HIGH (ref 3.4–10.8)

## 2019-09-02 ENCOUNTER — Other Ambulatory Visit: Payer: Self-pay | Admitting: Lactation Services

## 2019-09-02 ENCOUNTER — Telehealth: Payer: Self-pay | Admitting: Obstetrics and Gynecology

## 2019-09-02 DIAGNOSIS — O0990 Supervision of high risk pregnancy, unspecified, unspecified trimester: Secondary | ICD-10-CM

## 2019-09-02 NOTE — Telephone Encounter (Signed)
Returned pts call. Received message that call could not be completed at this time x 3. Lab orders placed for appt next week.

## 2019-09-02 NOTE — Telephone Encounter (Addendum)
Called pt again on her cell phone. She did not answer and mobile phone # said the person's last name was Little. Did not leave a message.   Attempted home phone # listed and received the message that call cannot be completed at this time. Will send My Chart Message to pt.   Called again on the cell phone and a person answered and handed the phone to Ms. Guarisco. She was calling to get her Genetic Testing results.   Informed pt that her Horizon results are normal and that there was a notification received that her Lucina Mellow was delayed 2-3 days. Enc pt to check the website over the next few days and then call us back if she does not see them. Pt voiced understanding.

## 2019-09-02 NOTE — Telephone Encounter (Signed)
Requesting a call back for results.

## 2019-09-06 ENCOUNTER — Telehealth: Payer: Self-pay | Admitting: Obstetrics & Gynecology

## 2019-09-06 NOTE — Telephone Encounter (Signed)
The patient stated she would like a call back regarding her natera test results. The patient stated she would like a call back on a different number [phone]. Informed the patient of the 24-48 hour timeframe.

## 2019-09-06 NOTE — Telephone Encounter (Signed)
Called pt and informed pt that her Horizon came back normal however we are going to have to request that we redraw the Panorama and that includes the gender.  Pt states that she will be in the office on Wednesday, 09/08/19.  I advised pt that we will redraw her that day, it will take another two weeks, and that we do not give gender over the phone.  Pt verbalized understanding.   Mel Almond, RN 09/06/19

## 2019-09-08 ENCOUNTER — Telehealth: Payer: 59 | Admitting: Obstetrics and Gynecology

## 2019-09-08 ENCOUNTER — Ambulatory Visit: Payer: 59 | Attending: Internal Medicine

## 2019-09-08 ENCOUNTER — Ambulatory Visit: Payer: 59 | Admitting: Obstetrics and Gynecology

## 2019-09-08 ENCOUNTER — Other Ambulatory Visit: Payer: Self-pay

## 2019-09-08 VITALS — BP 111/76 | HR 86 | Wt 143.4 lb

## 2019-09-08 DIAGNOSIS — J069 Acute upper respiratory infection, unspecified: Secondary | ICD-10-CM | POA: Insufficient documentation

## 2019-09-08 DIAGNOSIS — O0990 Supervision of high risk pregnancy, unspecified, unspecified trimester: Secondary | ICD-10-CM

## 2019-09-08 DIAGNOSIS — Z20822 Contact with and (suspected) exposure to covid-19: Secondary | ICD-10-CM

## 2019-09-08 NOTE — Progress Notes (Signed)
Pt began coughing during check-in process. Asked about other symptoms; pt reports congestion beginning 2 days prior. Last COVID-19 test negative on 08/21/19. Check in completed included FHR and BP. Pt given information for COVID-19 testing site. Pt agrees to do a MyChart visit with provider after she leaves the office. However, unable to contact pt via MyChart following visit. PHQ-9 continues to be positive and GAD-7 now positive; front office notified to schedule appt with [facility].   Apolonio Schneiders RN 09/08/19

## 2019-09-09 LAB — NOVEL CORONAVIRUS, NAA: SARS-CoV-2, NAA: DETECTED — AB

## 2019-09-09 NOTE — BH Specialist Note (Signed)
Pt did not arrive to video visit and did not answer the phone ; Unable to leave voicemail ; left MyChart message for patient.    [place] via Telemedicine Video Visit  09/09/2019 Charli Caraker [id number]  Garlan Fair

## 2019-09-09 NOTE — Progress Notes (Signed)
OB Note See RN note. Appt made for pt to get COVID testing and will switch to virtual visit for today. But pt didn't log onto mychart and unable to get in touch with her via phone.  Front desk and staff to try and contact patient. Pt has appt for next week  Durene Romans MD Attending Center for [employer] (Faculty Practice) 09/08/2019

## 2019-09-10 ENCOUNTER — Encounter: Payer: Self-pay | Admitting: Medical

## 2019-09-10 ENCOUNTER — Telehealth: Payer: Self-pay

## 2019-09-10 DIAGNOSIS — Z8616 Personal history of COVID-19: Secondary | ICD-10-CM | POA: Insufficient documentation

## 2019-09-10 HISTORY — DX: Personal history of COVID-19: Z86.16

## 2019-09-10 NOTE — Progress Notes (Signed)
Your test for COVID-19 was positive ("detected"), meaning that you were infected with the novel coronavirus and could give the germ to others.    Please continue isolation at home, for at least 10 days since the start of your fever/cough/breathlessness and until you have had 24 hours without fever (without taking a fever reducer) and with any cough/breathlessness improving. Use over-the-counter medications for symptoms.  If you have had no symptoms, but were exposed to someone who was positive for COVID-19, you will need to quarantine and self-isolate for 14 days from the date of exposure.    Please continue good preventive care measures, including:  frequent hand-washing, avoid touching your face, cover coughs/sneezes, stay out of crowds and keep a 6 foot distance from others.  Clean hard surfaces touched frequently with disinfectant cleaning products.   Please check in with your primary care provider about your positive test result.  Go to the nearest urgent care or ED for assessment if you have severe breathlessness or severe weakness/fatigue (ex needing new help getting out of bed or to the bathroom).  Members of your household will also need to quarantine for 14 days from the date of your positive test.You may also be contacted by the health department for follow up. Please call  at [phone] if you have any questions or concerns.

## 2019-09-10 NOTE — Telephone Encounter (Signed)
Pt. Called for COVID results; advised that the virus was detected.  Given CDC guidelines for quarantining/ safe practice guidelines.  See result note.

## 2019-09-11 LAB — T3, FREE: T3, Free: 5.6 pg/mL — ABNORMAL HIGH (ref 2.0–4.4)

## 2019-09-11 LAB — SPECIMEN STATUS REPORT

## 2019-09-11 LAB — T4, FREE: Free T4: 2.61 ng/dL — ABNORMAL HIGH (ref 0.82–1.77)

## 2019-09-13 ENCOUNTER — Ambulatory Visit: Payer: 59 | Admitting: Clinical

## 2019-09-13 ENCOUNTER — Other Ambulatory Visit: Payer: Self-pay

## 2019-09-13 DIAGNOSIS — Z91199 Patient's noncompliance with other medical treatment and regimen due to unspecified reason: Secondary | ICD-10-CM

## 2019-09-13 DIAGNOSIS — Z5329 Procedure and treatment not carried out because of patient's decision for other reasons: Secondary | ICD-10-CM

## 2019-09-17 ENCOUNTER — Telehealth (INDEPENDENT_AMBULATORY_CARE_PROVIDER_SITE_OTHER): Payer: 59 | Admitting: Obstetrics and Gynecology

## 2019-09-17 ENCOUNTER — Encounter: Payer: Self-pay | Admitting: Obstetrics and Gynecology

## 2019-09-17 DIAGNOSIS — O0990 Supervision of high risk pregnancy, unspecified, unspecified trimester: Secondary | ICD-10-CM

## 2019-09-17 DIAGNOSIS — D75839 Thrombocytosis, unspecified: Secondary | ICD-10-CM

## 2019-09-17 DIAGNOSIS — O219 Vomiting of pregnancy, unspecified: Secondary | ICD-10-CM

## 2019-09-17 DIAGNOSIS — O99891 Other specified diseases and conditions complicating pregnancy: Secondary | ICD-10-CM

## 2019-09-17 DIAGNOSIS — R7989 Other specified abnormal findings of blood chemistry: Secondary | ICD-10-CM

## 2019-09-17 DIAGNOSIS — O98512 Other viral diseases complicating pregnancy, second trimester: Secondary | ICD-10-CM

## 2019-09-17 DIAGNOSIS — U071 COVID-19: Secondary | ICD-10-CM

## 2019-09-17 DIAGNOSIS — E0590 Thyrotoxicosis, unspecified without thyrotoxic crisis or storm: Secondary | ICD-10-CM | POA: Insufficient documentation

## 2019-09-17 DIAGNOSIS — R945 Abnormal results of liver function studies: Secondary | ICD-10-CM

## 2019-09-17 DIAGNOSIS — D473 Essential (hemorrhagic) thrombocythemia: Secondary | ICD-10-CM

## 2019-09-17 DIAGNOSIS — Z3A15 15 weeks gestation of pregnancy: Secondary | ICD-10-CM

## 2019-09-17 NOTE — Progress Notes (Signed)
I connected with  Jody Robinson on 09/17/19 at [phone] by telephone and verified that I am speaking with the correct person using two identifiers.   I discussed the limitations, risks, security and privacy concerns of performing an evaluation and management service by telephone and the availability of in person appointments. I also discussed with the patient that there may be a patient responsible charge related to this service. The patient expressed understanding and agreed to proceed.  Pt reports headache 5/10. Pt reports headaches prior to pregnancy; headaches have been more frequent since pregnancy. Encouraged pt to try Tylenol for headache and to rest. Explained to pt it is important she buy her own BP cuff and check weekly as this could be a sign of elevated BP. Pt verbalized understanding.  Annabell Howells, RN 09/17/2019  9:17 AM

## 2019-09-17 NOTE — Progress Notes (Signed)
   TELEHEALTH OBSTETRICS PRENATAL VIRTUAL VIDEO VISIT ENCOUNTER NOTE  Provider location: Center for [place] at [place]   I connected with Jody Robinson on 09/17/19 at  9:15 AM EST by MyChart Video Encounter at home and verified that I am speaking with the correct person using two identifiers.   I discussed the limitations, risks, security and privacy concerns of performing an evaluation and management service virtually and the availability of in person appointments. I also discussed with the patient that there may be a patient responsible charge related to this service. The patient expressed understanding and agreed to proceed. Subjective:  Jody Robinson is a 29 y.o. G3P1011 at [redacted]w[redacted]d being seen today for ongoing prenatal care.  She is currently monitored for the following issues for this high-risk pregnancy and has IUFD at less than 20 weeks of gestation; Abnormal LFTs; Supervision of high risk pregnancy, antepartum; Depression; Nausea/vomiting in pregnancy; Hyperemesis gravidarum; Seasonal allergies; Herpes simplex; History of abnormal cervical Pap smear; Thrombocytosis ([place]); Pancreatitis, acute; Gallbladder sludge; Upper respiratory tract infection; COVID-19; and Low TSH level on their problem list.  Patient reports headache.  Contractions: Irritability. Vag. Bleeding: None.  Movement: Absent. Denies any leaking of fluid.   The following portions of the patient's history were reviewed and updated as appropriate: allergies, current medications, past family history, past medical history, past social history, past surgical history and problem list.   Objective:  There were no vitals filed for this visit.  Fetal Status:     Movement: Absent     General:  Alert, oriented and cooperative. Patient is in no acute distress.  Respiratory: Normal respiratory effort, no problems with respiration noted  Mental Status: Normal mood and affect. Normal behavior. Normal judgment and thought  content.  Rest of physical exam deferred due to type of encounter  Imaging:  Assessment and Plan:  Pregnancy: G3P1011 at [redacted]w[redacted]d  1. Thrombocytosis ([place])  2. Nausea/vomiting in pregnancy Improved  3. Supervision of high risk pregnancy, antepartum  4. Abnormal LFTs Repeat next visit  5. COVID-19 Dx 09/08/19 Feeling better  6. Low TSH level Mildly elevated T3/T4 Repeat mid Feb   Preterm labor symptoms and general obstetric precautions including but not limited to vaginal bleeding, contractions, leaking of fluid and fetal movement were reviewed in detail with the patient. I discussed the assessment and treatment plan with the patient. The patient was provided an opportunity to ask questions and all were answered. The patient agreed with the plan and demonstrated an understanding of the instructions. The patient was advised to call back or seek an in-person office evaluation/go to [facility] at [facility] for any urgent or concerning symptoms. Please refer to After Visit Summary for other counseling recommendations.   I provided 15 minutes of face-to-face time during this encounter.  Return in about 2 weeks (around 10/01/2019) for in person, high OB, needs labs.  Future Appointments  Date Time Provider [place]  10/12/2019  2:30 PM WH-MFC [name] 1 WH-MFCUS MFC-US    Permelia Bamba M Anetta Olvera, [place] for [place], [place]

## 2019-10-04 ENCOUNTER — Ambulatory Visit (INDEPENDENT_AMBULATORY_CARE_PROVIDER_SITE_OTHER): Payer: 59 | Admitting: Obstetrics & Gynecology

## 2019-10-04 ENCOUNTER — Other Ambulatory Visit: Payer: Self-pay

## 2019-10-04 VITALS — BP 111/71 | HR 88 | Wt 148.5 lb

## 2019-10-04 DIAGNOSIS — O281 Abnormal biochemical finding on antenatal screening of mother: Secondary | ICD-10-CM

## 2019-10-04 DIAGNOSIS — O219 Vomiting of pregnancy, unspecified: Secondary | ICD-10-CM

## 2019-10-04 DIAGNOSIS — Z3A17 17 weeks gestation of pregnancy: Secondary | ICD-10-CM

## 2019-10-04 DIAGNOSIS — R7989 Other specified abnormal findings of blood chemistry: Secondary | ICD-10-CM

## 2019-10-04 DIAGNOSIS — O09292 Supervision of pregnancy with other poor reproductive or obstetric history, second trimester: Secondary | ICD-10-CM

## 2019-10-04 DIAGNOSIS — R945 Abnormal results of liver function studies: Secondary | ICD-10-CM

## 2019-10-04 DIAGNOSIS — Z8759 Personal history of other complications of pregnancy, childbirth and the puerperium: Secondary | ICD-10-CM

## 2019-10-04 DIAGNOSIS — O0990 Supervision of high risk pregnancy, unspecified, unspecified trimester: Secondary | ICD-10-CM

## 2019-10-04 NOTE — Patient Instructions (Signed)

## 2019-10-04 NOTE — Progress Notes (Signed)
   PRENATAL VISIT NOTE  Subjective:  Jody Robinson is a 29 y.o. G3P1011 at 52w5dbeing seen today for ongoing prenatal care.  She is currently monitored for the following issues for this high-risk pregnancy and has History of IUFD; Abnormal LFTs; Supervision of high risk pregnancy, antepartum; Depression; Nausea/vomiting in pregnancy; Hyperemesis gravidarum; Seasonal allergies; Herpes simplex; History of abnormal cervical Pap smear; Thrombocytosis ([place]; Pancreatitis, acute; Gallbladder sludge; Upper respiratory tract infection; COVID-19; and Low TSH level on their problem list.  Patient reports nausea.  Contractions: Not present. Vag. Bleeding: None.  Movement: Present. Denies leaking of fluid.   The following portions of the patient's history were reviewed and updated as appropriate: allergies, current medications, past family history, past medical history, past social history, past surgical history and problem list.   Objective:   Vitals:   10/04/19 1340  BP: 111/71  Pulse: 88  Weight: 148 lb 8 oz (67.4 kg)    Fetal Status: Fetal Heart Rate (bpm): 155   Movement: Present     General:  Alert, oriented and cooperative. Patient is in no acute distress.  Skin: Skin is warm and dry. No rash noted.   Cardiovascular: Normal heart rate noted  Respiratory: Normal respiratory effort, no problems with respiration noted  Abdomen: Soft, gravid, appropriate for gestational age.  Pain/Pressure: Absent     Pelvic: Cervical exam deferred        Extremities: Normal range of motion.  Edema: None  Mental Status: Normal mood and affect. Normal behavior. Normal judgment and thought content.   Assessment and Plan:  Pregnancy: G3P1011 at [redacted]w[redacted]d. Supervision of high risk pregnancy, antepartum Repeat labs that were abnormal - TSH - Lipase - CBC - Comp Met (CMET) - T4, free - T3, free - Genetic Screening - Comprehensive metabolic panel - AFP, Serum, Open Spina Bifida  2. History of IUFD 15  weeks last year  3. Nausea/vomiting in pregnancy Improved, responds to nausea medication  4. Low TSH level Repeat today  5. Abnormal LFTs Repeat today  Preterm labor symptoms and general obstetric precautions including but not limited to vaginal bleeding, contractions, leaking of fluid and fetal movement were reviewed in detail with the patient. Please refer to After Visit Summary for other counseling recommendations.   Return in about 4 weeks (around 11/01/2019) for virtual.  Future Appointments  Date Time Provider [place]3/09/2019  2:30 PM WH-MFC [place] WH-MFCUS MFC-US    JaEmeterio ReeveMD

## 2019-10-05 LAB — COMPREHENSIVE METABOLIC PANEL
ALT: 6 IU/L (ref 0–32)
AST: 17 IU/L (ref 0–40)
Albumin/Globulin Ratio: 1.1 — ABNORMAL LOW (ref 1.2–2.2)
Albumin: 3.5 g/dL — ABNORMAL LOW (ref 3.9–5.0)
Alkaline Phosphatase: 82 IU/L (ref 39–117)
BUN/Creatinine Ratio: 9 (ref 9–23)
BUN: 6 mg/dL (ref 6–20)
Bilirubin Total: <0.2 mg/dL (ref 0.0–1.2)
CO2: 21 mmol/L (ref 20–29)
Calcium: 8.7 mg/dL (ref 8.7–10.2)
Chloride: 103 mmol/L (ref 96–106)
Creatinine, Ser: 0.64 mg/dL (ref 0.57–1.00)
GFR calc Af Amer: 139 mL/min/{1.73_m2} (ref >59)
GFR calc non Af Amer: 121 mL/min/{1.73_m2} (ref >59)
Globulin, Total: 3.2 g/dL (ref 1.5–4.5)
Glucose: 78 mg/dL (ref 65–99)
Potassium: 4.5 mmol/L (ref 3.5–5.2)
Sodium: 138 mmol/L (ref 134–144)
Total Protein: 6.7 g/dL (ref 6.0–8.5)

## 2019-10-05 LAB — T4, FREE: Free T4: 0.97 ng/dL (ref 0.82–1.77)

## 2019-10-05 LAB — TSH: TSH: 0.816 u[IU]/mL (ref 0.450–4.500)

## 2019-10-05 LAB — CBC
Hematocrit: 29.7 % — ABNORMAL LOW (ref 34.0–46.6)
Hemoglobin: 9.5 g/dL — ABNORMAL LOW (ref 11.1–15.9)
MCH: 26.3 pg — ABNORMAL LOW (ref 26.6–33.0)
MCHC: 32 g/dL (ref 31.5–35.7)
MCV: 82 fL (ref 79–97)
Platelets: 586 10*3/uL — ABNORMAL HIGH (ref 150–450)
RBC: 3.61 x10E6/uL — ABNORMAL LOW (ref 3.77–5.28)
RDW: 14.5 % (ref 11.7–15.4)
WBC: 12.5 10*3/uL — ABNORMAL HIGH (ref 3.4–10.8)

## 2019-10-05 LAB — LIPASE: Lipase: 29 U/L (ref 14–72)

## 2019-10-05 LAB — T3, FREE: T3, Free: 2.9 pg/mL (ref 2.0–4.4)

## 2019-10-06 LAB — AFP, SERUM, OPEN SPINA BIFIDA
AFP MoM: 0.88
AFP Value: 39.6 ng/mL
Gest. Age on Collection Date: 17.5 weeks
Maternal Age At EDD: 30 yr
OSBR Risk 1 IN: 10000
Test Results:: NEGATIVE
Weight: 148 [lb_av]

## 2019-10-12 ENCOUNTER — Other Ambulatory Visit (HOSPITAL_COMMUNITY): Payer: Self-pay | Admitting: *Deleted

## 2019-10-12 ENCOUNTER — Other Ambulatory Visit: Payer: Self-pay

## 2019-10-12 ENCOUNTER — Ambulatory Visit (HOSPITAL_COMMUNITY)
Admission: RE | Admit: 2019-10-12 | Discharge: 2019-10-12 | Disposition: A | Discharge status: Home / Self Care | Payer: 59 | Source: Ambulatory Visit | Attending: Student | Admitting: Student

## 2019-10-12 DIAGNOSIS — O09292 Supervision of pregnancy with other poor reproductive or obstetric history, second trimester: Secondary | ICD-10-CM

## 2019-10-12 DIAGNOSIS — Z3490 Encounter for supervision of normal pregnancy, unspecified, unspecified trimester: Secondary | ICD-10-CM | POA: Diagnosis present

## 2019-10-12 DIAGNOSIS — Z363 Encounter for antenatal screening for malformations: Secondary | ICD-10-CM | POA: Diagnosis not present

## 2019-10-12 DIAGNOSIS — Z3A18 18 weeks gestation of pregnancy: Secondary | ICD-10-CM | POA: Diagnosis not present

## 2019-10-12 DIAGNOSIS — Z362 Encounter for other antenatal screening follow-up: Secondary | ICD-10-CM

## 2019-10-13 ENCOUNTER — Other Ambulatory Visit: Payer: Self-pay | Admitting: Obstetrics & Gynecology

## 2019-10-29 ENCOUNTER — Other Ambulatory Visit: Payer: Self-pay

## 2019-10-29 ENCOUNTER — Encounter (HOSPITAL_COMMUNITY): Payer: Self-pay | Admitting: Obstetrics & Gynecology

## 2019-10-29 ENCOUNTER — Inpatient Hospital Stay (HOSPITAL_COMMUNITY)
Admission: AD | Admit: 2019-10-29 | Discharge: 2019-10-29 | Disposition: A | Discharge status: Home / Self Care | Payer: 59 | Attending: Obstetrics & Gynecology | Admitting: Obstetrics & Gynecology

## 2019-10-29 DIAGNOSIS — Z3A21 21 weeks gestation of pregnancy: Secondary | ICD-10-CM

## 2019-10-29 DIAGNOSIS — O26899 Other specified pregnancy related conditions, unspecified trimester: Secondary | ICD-10-CM | POA: Diagnosis present

## 2019-10-29 DIAGNOSIS — O26892 Other specified pregnancy related conditions, second trimester: Secondary | ICD-10-CM | POA: Insufficient documentation

## 2019-10-29 DIAGNOSIS — R109 Unspecified abdominal pain: Secondary | ICD-10-CM | POA: Diagnosis not present

## 2019-10-29 DIAGNOSIS — R1030 Lower abdominal pain, unspecified: Secondary | ICD-10-CM | POA: Diagnosis present

## 2019-10-29 LAB — URINALYSIS, ROUTINE W REFLEX MICROSCOPIC
Bilirubin Urine: NEGATIVE
Glucose, UA: NEGATIVE mg/dL
Hgb urine dipstick: NEGATIVE
Ketones, ur: 20 mg/dL — AB
Leukocytes,Ua: NEGATIVE
Nitrite: NEGATIVE
Protein, ur: NEGATIVE mg/dL
Specific Gravity, Urine: 1.013 (ref 1.005–1.030)
pH: 7 (ref 5.0–8.0)

## 2019-10-29 LAB — WET PREP, GENITAL
Clue Cells Wet Prep HPF POC: NONE SEEN
Sperm: NONE SEEN
Trich, Wet Prep: NONE SEEN
Yeast Wet Prep HPF POC: NONE SEEN

## 2019-10-29 MED ORDER — COMFORT FIT MATERNITY SUPP SM MISC: 1.0000 [IU] | Freq: Every day | 0 refills | Status: DC | PRN

## 2019-10-29 NOTE — MAU Provider Note (Signed)
History     CSN: [id number]  Arrival date and time: 10/29/19 [id number]   First Provider Initiated Contact with Patient 10/29/19 1010      Chief Complaint  Patient presents with  . Abdominal Pain   HPI  Ms.  Jody Robinson is a 29 y.o. year old G32P1011 female at [redacted]w[redacted]d weeks gestation who presents to MAU reporting lower abdominal pain/cramping that started while she was at work this morning ~0800. She rates the pain 6/10. She denies any recent SI (;last was 2 wks ago), vaginal bleeding or abnormal vaginal discharge. She receives prenatal care at [facility]; her next appt is 11/03/2019, U/S 11/10/2019.   Past Medical History:  Diagnosis Date  . Abnormal Pap smear of cervix   . Depression   . Headache     Past Surgical History:  Procedure Laterality Date  . COLPOSCOPY      Family History  Problem Relation Age of Onset  . Diabetes Mother     Social History   Tobacco Use  . Smoking status: Never Smoker  . Smokeless tobacco: Never Used  Substance Use Topics  . Alcohol use: Not Currently    Comment: occasional  . Drug use: No    Allergies: No Known Allergies  Medications Prior to Admission  Medication Sig Dispense Refill Last Dose  . docusate sodium (COLACE) 100 MG capsule Take 100 mg by mouth 2 (two) times daily as needed for mild constipation.   Past Week at Unknown time  . metoCLOPramide (REGLAN) 10 MG tablet 1 tab po q6h x 72 hours and then 1 tab po q6h prn n/v 60 tablet 0 10/29/2019 at Unknown time  . Prenatal MV-Min-FA-Omega-3 (PRENATAL GUMMIES/DHA & FA PO) Take 2 tablets by mouth daily.   10/29/2019 at Unknown time  . feeding supplement, ENSURE ENLIVE, (ENSURE ENLIVE) LIQD Take 237 mLs by mouth 2 (two) times daily between meals. (Patient not taking: Reported on 08/25/2019) 237 mL 14   . oxyCODONE (OXY IR/ROXICODONE) 5 MG immediate release tablet Take 1 tablet (5 mg total) by mouth every 6 (six) hours as needed for severe pain. (Patient not taking: Reported on 09/17/2019) 15  tablet 0   . pantoprazole (PROTONIX) 20 MG tablet Take 1 tablet (20 mg total) by mouth daily. (Patient not taking: Reported on 09/17/2019) 30 tablet 1   . potassium chloride SA (KLOR-CON M15) 15 MEQ tablet Take 2 tablets (30 mEq total) by mouth 2 (two) times daily. (Patient not taking: Reported on 08/25/2019) 3 tablet 0   . promethazine (PHENERGAN) 12.5 MG suppository Place 1 suppository (12.5 mg total) rectally every 6 (six) hours as needed for nausea or vomiting. (Patient not taking: Reported on 10/04/2019) 12 each 2     Review of Systems  Constitutional: Negative.   HENT: Negative.   Eyes: Negative.   Respiratory: Negative.   Cardiovascular: Negative.   Gastrointestinal: Negative.   Endocrine: Negative.   Genitourinary: Positive for pelvic pain (lower, started about 0800 this AM).  Musculoskeletal: Negative.   Skin: Negative.   Allergic/Immunologic: Negative.   Neurological: Negative.   Hematological: Negative.   Psychiatric/Behavioral: Negative.    Physical Exam   Blood pressure 115/73, pulse 89, temperature 98.3 F (36.8 C), resp. rate 16, height 5' (1.524 m), weight 65.7 kg, last menstrual period 06/02/2019, SpO2 100 %.  Physical Exam  Nursing note and vitals reviewed. Constitutional: She is oriented to person, place, and time. She appears well-developed and well-nourished.  HENT:  Head: Normocephalic and atraumatic.  Eyes:  Pupils are equal, round, and reactive to light.  Cardiovascular: Normal rate.  Respiratory: Effort normal.  GI: Soft.  Genitourinary:    Genitourinary Comments: Uterus: gravid, S=D, SE: cervix is smooth, pink, no lesions, small amt of yellowish-white vaginal d/c -- WP, GC/CT done, closed/long/firm, no CMT or friability, no adnexal tenderness    Musculoskeletal:        General: Normal range of motion.     Cervical back: Normal range of motion.  Neurological: She is alert and oriented to person, place, and time.  Skin: Skin is warm and dry.  Psychiatric:  She has a normal mood and affect. Her behavior is normal. Judgment and thought content normal.   FHTs by doppler: 154 bpm TOCO: none  MAU Course  Procedures  MDM CCUA Wet Prep GC/CT -- Results pending   Results for orders placed or performed during the hospital encounter of 10/29/19 (from the past 48 hour(s))  Urinalysis, Routine w reflex microscopic     Status: Abnormal   Collection Time: 10/29/19  9:33 AM  Result Value Ref Range   Color, Urine YELLOW YELLOW   APPearance CLEAR CLEAR   Specific Gravity, Urine 1.013 1.005 - 1.030   pH 7.0 5.0 - 8.0   Glucose, UA NEGATIVE NEGATIVE mg/dL   Hgb urine dipstick NEGATIVE NEGATIVE   Bilirubin Urine NEGATIVE NEGATIVE   Ketones, ur 20 (A) NEGATIVE mg/dL   Protein, ur NEGATIVE NEGATIVE mg/dL   Nitrite NEGATIVE NEGATIVE   Leukocytes,Ua NEGATIVE NEGATIVE    Comment: Performed at [facility], [place] [address], [place], [place] [address]  Wet prep, genital     Status: Abnormal   Collection Time: 10/29/19 10:22 AM   Specimen: Cervix  Result Value Ref Range   Yeast Wet Prep HPF POC NONE SEEN NONE SEEN   Trich, Wet Prep NONE SEEN NONE SEEN   Clue Cells Wet Prep HPF POC NONE SEEN NONE SEEN   WBC, Wet Prep HPF POC MANY (A) NONE SEEN   Sperm NONE SEEN     Comment: Performed at [facility], [place] [address], [place],  [address]     Assessment and Plan  Abdominal cramping affecting pregnancy - Plan: Discharge patient - Information provided on abdominal pain in pregnancy - Advised to take Tylenol 1000 mg every 6 hours prn pain.  - Rx for maternity support belt and instructions on how to pick it up   Laury Deep, MSN, CNM 10/29/2019, 10:10 AM

## 2019-10-29 NOTE — Discharge Instructions (Signed)
   PREGNANCY SUPPORT BELT: You are not alone, Seventy-five percent of women have some sort of abdominal or back pain at some point in their pregnancy. Your baby is growing at a fast pace, which means that your whole body is rapidly trying to adjust to the changes. As your uterus grows, your back may start feeling a bit under stress and this can result in back or abdominal pain that can go from mild, and therefore bearable, to severe pains that will not allow you to sit or lay down comfortably, When it comes to dealing with pregnancy-related pains and cramps, some pregnant women usually prefer natural remedies, which the market is filled with nowadays. For example, wearing a pregnancy support belt can help ease and lessen your discomfort and pain. WHAT ARE THE BENEFITS OF WEARING A PREGNANCY SUPPORT BELT? A pregnancy support belt provides support to the lower portion of the belly taking some of the weight of the growing uterus and distributing to the other parts of your body. It is designed make you comfortable and gives you extra support. Over the years, the pregnancy apparel market has been studying the needs and wants of pregnant women and they have come up with the most comfortable pregnancy support belts that woman could ever ask for. In fact, you will no longer have to wear a stretched-out or bulky pregnancy belt that is visible underneath your clothes and makes you feel even more uncomfortable. Nowadays, a pregnancy support belt is made of comfortable and stretchy materials that will not irritate your skin but will actually make you feel at ease and you will not even notice you are wearing it. They are easy to put on and adjust during the day and can be worn at night for additional support.  BENEFITS: . Relives Back pain . Relieves Abdominal Muscle and Leg Pain . Stabilizes the Pelvic Ring . Offers a Cushioned Abdominal Lift Pad . Relieves pressure on the Sciatic Nerve Within Minutes WHERE TO GET  YOUR PREGNANCY BELT:  Child psychotherapist and Orthotics  [address], [place] [address]  [phone]  [email or web address]

## 2019-10-29 NOTE — MAU Note (Signed)
.   Jody Robinson is a 29 y.o. at [redacted]w[redacted]d here in MAU reporting: lower abdominal pain that started this morning while she was at work Denies any vaginal bleeding or abnormal discharge LMP:06/02/19  Onset of complaint: this morning Pain score: 6 Vitals:   10/29/19 0930  BP: 115/73  Pulse: 89  Resp: 16  Temp: 98.3 F (36.8 C)  SpO2: 100%     FHT:154 Lab orders placed from triage: UA

## 2019-11-01 LAB — GC/CHLAMYDIA PROBE AMP (~~LOC~~) NOT AT ARMC
Chlamydia: NEGATIVE
Comment: NEGATIVE
Comment: NORMAL
Neisseria Gonorrhea: POSITIVE — AB

## 2019-11-03 ENCOUNTER — Telehealth (INDEPENDENT_AMBULATORY_CARE_PROVIDER_SITE_OTHER): Payer: 59 | Admitting: Family Medicine

## 2019-11-03 DIAGNOSIS — O219 Vomiting of pregnancy, unspecified: Secondary | ICD-10-CM

## 2019-11-03 DIAGNOSIS — O26892 Other specified pregnancy related conditions, second trimester: Secondary | ICD-10-CM

## 2019-11-03 DIAGNOSIS — D473 Essential (hemorrhagic) thrombocythemia: Secondary | ICD-10-CM

## 2019-11-03 DIAGNOSIS — D75839 Thrombocytosis, unspecified: Secondary | ICD-10-CM

## 2019-11-03 DIAGNOSIS — Z8759 Personal history of other complications of pregnancy, childbirth and the puerperium: Secondary | ICD-10-CM

## 2019-11-03 DIAGNOSIS — O0992 Supervision of high risk pregnancy, unspecified, second trimester: Secondary | ICD-10-CM

## 2019-11-03 DIAGNOSIS — O0990 Supervision of high risk pregnancy, unspecified, unspecified trimester: Secondary | ICD-10-CM

## 2019-11-03 DIAGNOSIS — R109 Unspecified abdominal pain: Secondary | ICD-10-CM

## 2019-11-03 DIAGNOSIS — O26899 Other specified pregnancy related conditions, unspecified trimester: Secondary | ICD-10-CM

## 2019-11-03 DIAGNOSIS — Z3A22 22 weeks gestation of pregnancy: Secondary | ICD-10-CM

## 2019-11-03 MED ORDER — CYCLOBENZAPRINE HCL 10 MG PO TABS: 10.0000 mg | ORAL_TABLET | Freq: Three times a day (TID) | ORAL | 2 refills | Status: DC | PRN

## 2019-11-03 NOTE — Progress Notes (Signed)
I connected with  [name] on 11/03/19 at 1543 by telephone and verified that I am speaking with the correct person using two identifiers.   I discussed the limitations, risks, security and privacy concerns of performing an evaluation and management service by telephone and the availability of in person appointments. I also discussed with the patient that there may be a patient responsible charge related to this service. The patient expressed understanding and agreed to proceed.  Pt does not have BP cuff; requested pt to please buy cuff as soon as she is able. Asked pt to check BP and record in [facility]. Pt denies any headache, blurry vision, or dizziness today.   Annabell Howells, RN 11/03/2019  3:43 PM

## 2019-11-03 NOTE — Progress Notes (Signed)
OBSTETRICS PRENATAL VIRTUAL VISIT ENCOUNTER NOTE  Provider location: [facility] [place]   I connected with Jody Robinson on 11/03/19 at  4:15 PM EDT by MyChart Video Encounter at home and verified that I am speaking with the correct person using two identifiers.   I discussed the limitations, risks, security and privacy concerns of performing an evaluation and management service virtually and the availability of in person appointments. I also discussed with the patient that there may be a patient responsible charge related to this service. The patient expressed understanding and agreed to proceed. Subjective:  Kippi Anuszewski is a 29 y.o. G3P1011 at [redacted]w[redacted]d being seen today for ongoing prenatal care.  She is currently monitored for the following issues for this high-risk pregnancy and has History of IUFD; Abnormal LFTs; Supervision of high risk pregnancy, antepartum; Depression; Nausea/vomiting in pregnancy; Hyperemesis gravidarum; Seasonal allergies; Herpes simplex; History of abnormal cervical Pap smear; Thrombocytosis ([place]); Pancreatitis, acute; Gallbladder sludge; COVID-19; Low TSH level; and Abdominal cramping affecting pregnancy on their problem list.  Patient reports no complaints.  Contractions: Not present. Vag. Bleeding: None.  Movement: Present. Denies any leaking of fluid.   The following portions of the patient's history were reviewed and updated as appropriate: allergies, current medications, past family history, past medical history, past social history, past surgical history and problem list.   Objective:  There were no vitals filed for this visit.  Fetal Status:     Movement: Present     General:  Alert, oriented and cooperative. Patient is in no acute distress.  Respiratory: Normal respiratory effort, no problems with respiration noted  Mental Status: Normal mood and affect. Normal behavior. Normal judgment and thought content.  Rest of physical  exam deferred due to type of encounter  Imaging: [name] MFM OB COMP + 14 WK  Result Date: 10/12/2019 ----------------------------------------------------------------------  OBSTETRICS REPORT                       (Signed Final 10/12/2019 04:21 pm) ---------------------------------------------------------------------- Patient Info  ID #:       [id number]                          D.O.B.:  [birth year] (28 yrs)  Name:       Jody Robinson               Visit Date: 10/12/2019 03:26 pm ---------------------------------------------------------------------- Performed By  Performed By:     Georgie Chard        Ref. Address:     [address]                    [place] [address]  Attending:  Corenthian Booker      [facility]                                       [facility]  Referred By:      Luna Glasgow CNM ---------------------------------------------------------------------- Orders   #  Description                          Code         Ordered By   1  [name] MFM OB COMP + 14 WK               76805.01     KATHRYN KOOISTRA  ----------------------------------------------------------------------   #  Order #                    Accession #                 Episode #   1  [id number]                  [id number]                  [id number]  ---------------------------------------------------------------------- Indications   Encounter for antenatal screening for          Z36.3   malformations   Poor obstetric history: Previous IUFD (@ 39    O09.299   weeks)   [redacted] weeks gestation of pregnancy                Z3A.18  ---------------------------------------------------------------------- Fetal Evaluation  Num Of Fetuses:         1  Fetal Heart Rate(bpm):  143  Cardiac Activity:       Observed  Presentation:           Breech   Placenta:               Anterior  P. Cord Insertion:      Visualized  Amniotic Fluid  AFI FV:      Within normal limits                              Largest Pocket(cm)                              6.15 ---------------------------------------------------------------------- Biometry  BPD:      42.4  mm     G. Age:  18w 6d         50  %    CI:        73.29   %    70 - 86                                                          FL/HC:  18.4   %    16.1 - 18.3  HC:      157.4  mm     G. Age:  18w 4d         31  %    HC/AC:      1.10        1.09 - 1.39  AC:      142.6  mm     G. Age:  19w 4d         71  %    FL/BPD:     68.4   %  FL:         29  mm     G. Age:  18w 6d         46  %    FL/AC:      20.3   %    20 - 24  HUM:      28.1  mm     G. Age:  19w 0d         56  %  CER:      20.7  mm     G. Age:  19w 5d         71  %  NFT:       4.7  mm  LV:          4  mm  CM:        2.3  mm  Est. FW:     280  gm    0 lb 10 oz      67  % ---------------------------------------------------------------------- OB History  Gravidity:    3         Term:   1         SAB:   1  Living:       1 ---------------------------------------------------------------------- Gestational Age  LMP:           18w 6d        Date:  06/02/19                 EDD:   03/08/20  U/S Today:     19w 0d                                        EDD:   03/07/20  Best:          18w 6d     Det. By:  LMP  (06/02/19)          EDD:   03/08/20 ---------------------------------------------------------------------- Anatomy  Cranium:               Appears normal         Aortic Arch:            Appears normal  Cavum:                 Appears normal         Ductal Arch:            Appears normal  Ventricles:            Appears normal         Diaphragm:              Appears normal  Choroid Plexus:        Appears normal         Stomach:  Appears normal, left                                                                        sided  Cerebellum:            Appears  normal         Abdomen:                Appears normal  Posterior Fossa:       Appears normal         Abdominal Wall:         Appears nml (cord                                                                        insert, abd wall)  Nuchal Fold:           Not applicable ([id number]    Cord Vessels:           Appears normal ([redacted]                         wks GA)                                        vessel cord)  Face:                  Appears normal         Kidneys:                Appear normal                         (orbits and profile)  Lips:                  Appears normal         Bladder:                Appears normal  Thoracic:              Appears normal         Spine:                  Limited views                                                                        appear normal  Heart:                 Not well visualized    Upper Extremities:      Appears normal  RVOT:  Not well visualized    Lower Extremities:      Appears normal  LVOT:                  Appears normal  Other:  Technically difficult due to fetal position. Female gender ---------------------------------------------------------------------- Cervix Uterus Adnexa  Cervix  Length:           3.32  cm.  Normal appearance by transabdominal scan. ---------------------------------------------------------------------- Impression  Normal anatomy  Limited spine views but normal intracranial anatomy  Suboptimal views of the fetal anatomy was obtained  secondary to fetal position. ---------------------------------------------------------------------- Recommendations  Follow up growth in 4 weeks. ----------------------------------------------------------------------               Sander Nephew, MD Electronically Signed Final Report   10/12/2019 04:21 pm ----------------------------------------------------------------------   Assessment and Plan:  Pregnancy: [id number] at [redacted]w[redacted]d 1. Supervision of high risk pregnancy, antepartum Continue  prenatal care. Has some back pain with prolonged standing at work. There is no accomodation related to pregnancy. Will try belly-band and heat--may need some PT.   2. History of 2nd trim loss at 15 wks  3. Abdominal cramping affecting pregnancy   4. Thrombocytosis ([place])   5. Nausea/vomiting in pregnancy Improved for now.  Preterm labor symptoms and general obstetric precautions including but not limited to vaginal bleeding, contractions, leaking of fluid and fetal movement were reviewed in detail with the patient. I discussed the assessment and treatment plan with the patient. The patient was provided an opportunity to ask questions and all were answered. The patient agreed with the plan and demonstrated an understanding of the instructions. The patient was advised to call back or seek an in-person office evaluation/go to MAU at [facility] for any urgent or concerning symptoms. Please refer to After Visit Summary for other counseling recommendations.   I provided 8 minutes of face-to-face time during this encounter.  Return in 5 weeks (on 12/08/2019) for 28 wk labs, in person, [facility].  Future Appointments  Date Time Provider Department Center  11/03/2019  4:15 PM [name], MD [facility] [place]  11/10/2019  3:30 PM [name], [place] for [facility], [place]

## 2019-11-03 NOTE — Patient Instructions (Signed)

## 2019-11-04 ENCOUNTER — Emergency Department (HOSPITAL_BASED_OUTPATIENT_CLINIC_OR_DEPARTMENT_OTHER)
Admission: EM | Admit: 2019-11-04 | Discharge: 2019-11-04 | Disposition: A | Discharge status: Home / Self Care | Payer: 59 | Attending: Emergency Medicine | Admitting: Emergency Medicine

## 2019-11-04 ENCOUNTER — Encounter (HOSPITAL_BASED_OUTPATIENT_CLINIC_OR_DEPARTMENT_OTHER): Payer: Self-pay | Admitting: *Deleted

## 2019-11-04 ENCOUNTER — Other Ambulatory Visit: Payer: Self-pay

## 2019-11-04 DIAGNOSIS — O98212 Gonorrhea complicating pregnancy, second trimester: Secondary | ICD-10-CM | POA: Diagnosis not present

## 2019-11-04 DIAGNOSIS — Z3A22 22 weeks gestation of pregnancy: Secondary | ICD-10-CM | POA: Insufficient documentation

## 2019-11-04 DIAGNOSIS — Z79899 Other long term (current) drug therapy: Secondary | ICD-10-CM | POA: Diagnosis not present

## 2019-11-04 DIAGNOSIS — A549 Gonococcal infection, unspecified: Secondary | ICD-10-CM

## 2019-11-04 MED ORDER — LIDOCAINE HCL (PF) 1 % IJ SOLN
INTRAMUSCULAR | Status: AC
  Administered 2019-11-04: 5 mL
  Filled 2019-11-04: qty 5

## 2019-11-04 MED ORDER — CEFTRIAXONE SODIUM 500 MG IJ SOLR
500.0000 mg | Freq: Once | INTRAMUSCULAR | Status: AC
  Administered 2019-11-04: 500 mg via INTRAMUSCULAR
  Filled 2019-11-04: qty 500

## 2019-11-04 NOTE — ED Triage Notes (Signed)
Patient was sent here from her OB GYN for gonorrhea test result, positive.

## 2019-11-04 NOTE — ED Provider Notes (Signed)
[facility] EMERGENCY DEPARTMENT Provider Note   CSN: [id number] Arrival date & time: 11/04/19  1701     History Chief Complaint  Patient presents with  . Exposure to STD    Jody Robinson is a 29 y.o. female G3P1011 at [redacted]w[redacted]d gestation presents to emergency department today with chief complaint of STD.  Patient states she received a phone call from her OB that she tested positive for gonorrhea.  Patient is denying any symptoms currently.  She denies fever, chills, abdominal pain, vaginal discharge, nausea, vomiting, back pain, flank pain, urinary frequency, dysuria, diarrhea.  She is here for STD treatment.  Her OB is [facility].  Her next appointment is scheduled on 10/13/2019 for ultrasound.   Portions of this note were generated with [employer]. Dictation errors may occur despite best attempts at proofreading.   Past Medical History:  Diagnosis Date  . Abnormal Pap smear of cervix   . Depression   . Headache     Patient Active Problem List   Diagnosis Date Noted  . Abdominal cramping affecting pregnancy 10/29/2019  . Low TSH level 09/17/2019  . COVID-19 09/10/2019  . Gallbladder sludge 08/22/2019  . Pancreatitis, acute 08/20/2019  . Thrombocytosis ([place]) 08/16/2019  . Nausea/vomiting in pregnancy 08/04/2019  . Supervision of high risk pregnancy, antepartum 07/13/2019  . Depression   . Abnormal LFTs 02/07/2019  . History of IUFD 02/06/2019  . Hyperemesis gravidarum 01/15/2019  . Seasonal allergies 10/09/2017  . History of abnormal cervical Pap smear 10/09/2017  . Herpes simplex 01/16/2013    Past Surgical History:  Procedure Laterality Date  . COLPOSCOPY       OB History    Gravida  3   Para  1   Term  1   Preterm      AB  1   Living  1     SAB  1   TAB      Ectopic      Multiple      Live Births  1           Family History  Problem Relation Age of Onset  . Diabetes Mother     Social History   Tobacco  Use  . Smoking status: Never Smoker  . Smokeless tobacco: Never Used  Substance Use Topics  . Alcohol use: Not Currently    Comment: occasional  . Drug use: No    Home Medications Prior to Admission medications   Medication Sig Start Date End Date Taking? Authorizing Provider  Prenatal Vit-Fe Fumarate-FA (PRENATAL VITAMINS PO) Take by mouth.   Yes [provider]  cyclobenzaprine (FLEXERIL) 10 MG tablet Take 1 tablet (10 mg total) by mouth 3 (three) times daily as needed for muscle spasms. 11/03/19   [name], MD  Elastic Bandages & Supports (COMFORT FIT MATERNITY SUPP SM) MISC 1 Units by Does not apply route daily as needed. Patient not taking: Reported on 11/03/2019 10/29/19   Laury Deep, CNM  feeding supplement, ENSURE ENLIVE, (ENSURE ENLIVE) LIQD Take 237 mLs by mouth 2 (two) times daily between meals. Patient not taking: Reported on 08/25/2019 08/16/19   Nugent, Gerrie Nordmann, NP    Allergies    Patient has no known allergies.  Review of Systems   Review of Systems  All other systems are reviewed and are negative for acute change except as noted in the HPI.   Physical Exam Updated Vital Signs BP 117/83 (BP Location: Right Arm)  Pulse 97   Temp 98.9 F (37.2 C) (Oral)   Resp 16   Ht 5' (1.524 m)   Wt 65.3 kg   LMP 06/02/2019   SpO2 100%   BMI 28.12 kg/m   Physical Exam Vitals and nursing note reviewed.  Constitutional:      Appearance: She is well-developed. She is not ill-appearing or toxic-appearing.  HENT:     Head: Normocephalic and atraumatic.     Nose: Nose normal.  Eyes:     General: No scleral icterus.       Right eye: No discharge.        Left eye: No discharge.     Conjunctiva/sclera: Conjunctivae normal.  Neck:     Vascular: No JVD.  Cardiovascular:     Rate and Rhythm: Normal rate and regular rhythm.     Pulses: Normal pulses.     Heart sounds: Normal heart sounds.  Pulmonary:     Effort: Pulmonary effort is normal.     Breath  sounds: Normal breath sounds.  Abdominal:     General: There is no distension.     Comments: Gravid abdomen. No tenderness, no guarding or rebound. No peritoneal signs. No CVA tenderness  Musculoskeletal:        General: Normal range of motion.     Cervical back: Normal range of motion.  Skin:    General: Skin is warm and dry.  Neurological:     Mental Status: She is oriented to person, place, and time.     GCS: GCS eye subscore is 4. GCS verbal subscore is 5. GCS motor subscore is 6.     Comments: Fluent speech, no facial droop.  Psychiatric:        Behavior: Behavior normal.        ED Results / Procedures / Treatments   Labs (all labs ordered are listed, but only abnormal results are displayed) Labs Reviewed - No data to display  EKG None  Radiology No results found.  Procedures Procedures (including critical care time)  Medications Ordered in ED Medications  cefTRIAXone (ROCEPHIN) injection 500 mg (has no administration in time range)  lidocaine (PF) (XYLOCAINE) 1 % injection (has no administration in time range)    ED Course  I have reviewed the triage vital signs and the nursing notes.  Pertinent labs & imaging results that were available during my care of the patient were reviewed by me and considered in my medical decision making (see chart for details).    MDM Rules/Calculators/A&P                      Patient is very well appearing, in no acute distress. VSS. No abdominal pain, no urinary complaints. No CVA tenderness. Patient is here for treatment for gonorrhea. She has no other constitutional symptoms.  She denies any abdominal pain or vaginal discharge.  Chart review shows she had wet prep that was negative for trichomoniasis, yeast, clue cells.  She did have positive test for gonorrhea, negative for chlamydia.  Will treat for gonorrhea with Rocephin. Discussed safe sex practices with patient.   The patient appears reasonably screened and/or stabilized  for discharge and I doubt any other medical condition or other [facility] requiring further screening, evaluation, or treatment in the ED at this time prior to discharge. The patient is safe for discharge with strict return precautions discussed. Recommend ob follow up if needed.  Portions of this note were generated with Dragon dictation  software. Dictation errors may occur despite best attempts at proofreading.   Final Clinical Impression(s) / ED Diagnoses Final diagnoses:  Gonorrhea    Rx / DC Orders ED Discharge Orders    None       Cherre Robins, PA-C 11/04/19 1742    Lennice Sites, DO 11/04/19 1808

## 2019-11-04 NOTE — Discharge Instructions (Addendum)
You have been seen today for gonorrhea. Please read and follow all provided instructions. Return to the emergency room for worsening condition or new concerning symptoms.    -You were treated for gonorrhea today.  You should not have sex for at least 7 days or until your symptoms resolve. You need to inform all partners of your positive test result because they need to also be treated.  1. Medications:  Continue usual home medications Take medications as prescribed. Please review all of the medicines and only take them if you do not have an allergy to them.   2. Treatment: rest, drink plenty of fluids  3. Follow Up:  Please follow up with OB by scheduling an appointment as soon as possible for a visit    It is also a possibility that you have an allergic reaction to any of the medicines that you have been prescribed - Everybody reacts differently to medications and while MOST people have no trouble with most medicines, you may have a reaction such as nausea, vomiting, rash, swelling, shortness of breath. If this is the case, please stop taking the medicine immediately and contact your physician.  ?

## 2019-11-10 ENCOUNTER — Other Ambulatory Visit: Payer: Self-pay

## 2019-11-10 ENCOUNTER — Ambulatory Visit (HOSPITAL_COMMUNITY)
Admission: RE | Admit: 2019-11-10 | Discharge: 2019-11-10 | Disposition: A | Discharge status: Home / Self Care | Payer: 59 | Source: Ambulatory Visit | Attending: Obstetrics and Gynecology | Admitting: Obstetrics and Gynecology

## 2019-11-10 DIAGNOSIS — O09292 Supervision of pregnancy with other poor reproductive or obstetric history, second trimester: Secondary | ICD-10-CM

## 2019-11-10 DIAGNOSIS — O321XX0 Maternal care for breech presentation, not applicable or unspecified: Secondary | ICD-10-CM | POA: Diagnosis not present

## 2019-11-10 DIAGNOSIS — Z3A23 23 weeks gestation of pregnancy: Secondary | ICD-10-CM | POA: Diagnosis not present

## 2019-11-10 DIAGNOSIS — Z362 Encounter for other antenatal screening follow-up: Secondary | ICD-10-CM | POA: Diagnosis not present

## 2019-11-11 ENCOUNTER — Other Ambulatory Visit (HOSPITAL_COMMUNITY): Payer: Self-pay | Admitting: *Deleted

## 2019-11-11 DIAGNOSIS — Z362 Encounter for other antenatal screening follow-up: Secondary | ICD-10-CM

## 2019-11-15 ENCOUNTER — Telehealth: Payer: Self-pay | Admitting: Family Medicine

## 2019-11-15 NOTE — Telephone Encounter (Signed)
Requesting a call back, she have general questions about her pregnancy

## 2019-11-17 ENCOUNTER — Inpatient Hospital Stay (HOSPITAL_COMMUNITY)
Admission: AD | Admit: 2019-11-17 | Discharge: 2019-11-17 | Disposition: A | Discharge status: Home / Self Care | Payer: 59 | Attending: Obstetrics and Gynecology | Admitting: Obstetrics and Gynecology

## 2019-11-17 ENCOUNTER — Encounter (HOSPITAL_COMMUNITY): Payer: Self-pay | Admitting: Obstetrics and Gynecology

## 2019-11-17 ENCOUNTER — Other Ambulatory Visit: Payer: Self-pay

## 2019-11-17 DIAGNOSIS — O219 Vomiting of pregnancy, unspecified: Secondary | ICD-10-CM | POA: Diagnosis not present

## 2019-11-17 DIAGNOSIS — O26899 Other specified pregnancy related conditions, unspecified trimester: Secondary | ICD-10-CM

## 2019-11-17 DIAGNOSIS — E860 Dehydration: Secondary | ICD-10-CM | POA: Diagnosis not present

## 2019-11-17 DIAGNOSIS — O26892 Other specified pregnancy related conditions, second trimester: Secondary | ICD-10-CM | POA: Insufficient documentation

## 2019-11-17 DIAGNOSIS — R519 Headache, unspecified: Secondary | ICD-10-CM

## 2019-11-17 DIAGNOSIS — Z3A24 24 weeks gestation of pregnancy: Secondary | ICD-10-CM | POA: Diagnosis not present

## 2019-11-17 DIAGNOSIS — O212 Late vomiting of pregnancy: Secondary | ICD-10-CM | POA: Diagnosis not present

## 2019-11-17 LAB — COMPREHENSIVE METABOLIC PANEL
ALT: 10 U/L (ref 0–44)
AST: 18 U/L (ref 15–41)
Albumin: 3 g/dL — ABNORMAL LOW (ref 3.5–5.0)
Alkaline Phosphatase: 70 U/L (ref 38–126)
Anion gap: 12 (ref 5–15)
BUN: 6 mg/dL (ref 6–20)
CO2: 21 mmol/L — ABNORMAL LOW (ref 22–32)
Calcium: 8.8 mg/dL — ABNORMAL LOW (ref 8.9–10.3)
Chloride: 105 mmol/L (ref 98–111)
Creatinine, Ser: 0.63 mg/dL (ref 0.44–1.00)
GFR calc Af Amer: >60 mL/min (ref >60)
GFR calc non Af Amer: >60 mL/min (ref >60)
Glucose, Bld: 81 mg/dL (ref 70–99)
Potassium: 3.5 mmol/L (ref 3.5–5.1)
Sodium: 138 mmol/L (ref 135–145)
Total Bilirubin: 0.9 mg/dL (ref 0.3–1.2)
Total Protein: 7 g/dL (ref 6.5–8.1)

## 2019-11-17 LAB — URINALYSIS, ROUTINE W REFLEX MICROSCOPIC
Bilirubin Urine: NEGATIVE
Glucose, UA: NEGATIVE mg/dL
Hgb urine dipstick: NEGATIVE
Ketones, ur: 80 mg/dL — AB
Leukocytes,Ua: NEGATIVE
Nitrite: NEGATIVE
Protein, ur: 100 mg/dL — AB
Specific Gravity, Urine: 1.027 (ref 1.005–1.030)
pH: 7 (ref 5.0–8.0)

## 2019-11-17 LAB — LIPASE, BLOOD: Lipase: 32 U/L (ref 11–51)

## 2019-11-17 MED ORDER — METOCLOPRAMIDE HCL 10 MG PO TABS: 10.0000 mg | ORAL_TABLET | Freq: Three times a day (TID) | ORAL | 1 refills | Status: DC

## 2019-11-17 MED ORDER — DIPHENHYDRAMINE HCL 50 MG/ML IJ SOLN
12.5000 mg | Freq: Once | INTRAMUSCULAR | Status: AC
  Administered 2019-11-17: 12.5 mg via INTRAVENOUS
  Filled 2019-11-17: qty 1

## 2019-11-17 MED ORDER — METOCLOPRAMIDE HCL 5 MG/ML IJ SOLN
10.0000 mg | Freq: Once | INTRAMUSCULAR | Status: AC
  Administered 2019-11-17: 10 mg via INTRAVENOUS
  Filled 2019-11-17: qty 2

## 2019-11-17 MED ORDER — DEXAMETHASONE SODIUM PHOSPHATE 10 MG/ML IJ SOLN
10.0000 mg | Freq: Once | INTRAMUSCULAR | Status: AC
  Administered 2019-11-17: 10 mg via INTRAVENOUS
  Filled 2019-11-17: qty 1

## 2019-11-17 MED ORDER — METOCLOPRAMIDE HCL 5 MG/ML IJ SOLN: 10.0000 mg | Freq: Once | INTRAMUSCULAR | Status: DC

## 2019-11-17 MED ORDER — LACTATED RINGERS IV BOLUS
1000.0000 mL | Freq: Once | INTRAVENOUS | Status: AC
  Administered 2019-11-17: 1000 mL via INTRAVENOUS

## 2019-11-17 MED ORDER — ONDANSETRON HCL 4 MG PO TABS: 4.0000 mg | ORAL_TABLET | Freq: Every day | ORAL | 1 refills | Status: DC | PRN

## 2019-11-17 MED ORDER — M.V.I. ADULT IV INJ
INTRAVENOUS | Status: AC
  Filled 2019-11-17: qty 1000

## 2019-11-17 MED ORDER — FAMOTIDINE IN NACL 20-0.9 MG/50ML-% IV SOLN
20.0000 mg | Freq: Once | INTRAVENOUS | Status: AC
  Administered 2019-11-17: 20 mg via INTRAVENOUS
  Filled 2019-11-17: qty 50

## 2019-11-17 NOTE — MAU Provider Note (Signed)
History     CSN: [id number]  Arrival date and time: 11/17/19 1605   First Provider Initiated Contact with Patient 11/17/19 1652      Chief Complaint  Patient presents with  . Headache  . Nausea  . Emesis   HPI  [name] is a 29 y.o. female G3P1011 @ [redacted]w[redacted]d here with nausea, vomiting and headache. States she does have a history of headaches. States her symptoms began Monday and then started vomiting on Tuesday. She attests to vomiting 6-7X per day and not keeping down water. She has not tried any medications for the nausea and vomiting. She has tried tylenol for the HA which has not helped. She denies diarrhea or sick contacts. She is requesting medication to use at home for her symptoms.    OB History    Gravida  3   Para  1   Term  1   Preterm      AB  1   Living  1     SAB  1   TAB      Ectopic      Multiple      Live Births  1           Past Medical History:  Diagnosis Date  . Abnormal Pap smear of cervix   . Depression   . Headache     Past Surgical History:  Procedure Laterality Date  . COLPOSCOPY      Family History  Problem Relation Age of Onset  . Diabetes Mother     Social History   Tobacco Use  . Smoking status: Never Smoker  . Smokeless tobacco: Never Used  Substance Use Topics  . Alcohol use: Not Currently    Comment: occasional  . Drug use: No    Allergies: No Known Allergies  Medications Prior to Admission  Medication Sig Dispense Refill Last Dose  . cyclobenzaprine (FLEXERIL) 10 MG tablet Take 1 tablet (10 mg total) by mouth 3 (three) times daily as needed for muscle spasms. 30 tablet 2   . Elastic Bandages & Supports (COMFORT FIT MATERNITY SUPP SM) MISC 1 Units by Does not apply route daily as needed. (Patient not taking: Reported on 11/03/2019) 1 each 0   . feeding supplement, ENSURE ENLIVE, (ENSURE ENLIVE) LIQD Take 237 mLs by mouth 2 (two) times daily between meals. (Patient not taking: Reported on  08/25/2019) 237 mL 14   . Prenatal Vit-Fe Fumarate-FA (PRENATAL VITAMINS PO) Take by mouth.      Results for orders placed or performed during the hospital encounter of 11/17/19 (from the past 48 hour(s))  Urinalysis, Routine w reflex microscopic     Status: Abnormal   Collection Time: 11/17/19  4:41 PM  Result Value Ref Range   Color, Urine YELLOW YELLOW   APPearance CLEAR CLEAR   Specific Gravity, Urine 1.027 1.005 - 1.030   pH 7.0 5.0 - 8.0   Glucose, UA NEGATIVE NEGATIVE mg/dL   Hgb urine dipstick NEGATIVE NEGATIVE   Bilirubin Urine NEGATIVE NEGATIVE   Ketones, ur 80 (A) NEGATIVE mg/dL   Protein, ur 100 (A) NEGATIVE mg/dL   Nitrite NEGATIVE NEGATIVE   Leukocytes,Ua NEGATIVE NEGATIVE   RBC / HPF 0-5 0 - 5 RBC/hpf   WBC, UA 0-5 0 - 5 WBC/hpf   Bacteria, UA RARE (A) NONE SEEN   Squamous Epithelial / LPF 0-5 0 - 5   Mucus PRESENT    Hyaline Casts, UA PRESENT     Comment:  Performed at [facility], [place] [address], [place], [place] [address]  Comprehensive metabolic panel     Status: Abnormal   Collection Time: 11/17/19  5:58 PM  Result Value Ref Range   Sodium 138 135 - 145 mmol/L   Potassium 3.5 3.5 - 5.1 mmol/L   Chloride 105 98 - 111 mmol/L   CO2 21 (L) 22 - 32 mmol/L   Glucose, Bld 81 70 - 99 mg/dL    Comment: Glucose reference range applies only to samples taken after fasting for at least 8 hours.   BUN 6 6 - 20 mg/dL   Creatinine, Ser 0.63 0.44 - 1.00 mg/dL   Calcium 8.8 (L) 8.9 - 10.3 mg/dL   Total Protein 7.0 6.5 - 8.1 g/dL   Albumin 3.0 (L) 3.5 - 5.0 g/dL   AST 18 15 - 41 U/L   ALT 10 0 - 44 U/L   Alkaline Phosphatase 70 38 - 126 U/L   Total Bilirubin 0.9 0.3 - 1.2 mg/dL   GFR calc non Af Amer >60 >60 mL/min   GFR calc Af Amer >60 >60 mL/min   Anion gap 12 5 - 15    Comment: Performed at [facility], [place] [address] [place], [place] [address]  Lipase, blood     Status: None   Collection Time: 11/17/19  5:58 PM  Result Value Ref Range   Lipase 32  11 - 51 U/L    Comment: Performed at [facility], [place] [address] [place], [place] [address]   Review of Systems  Gastrointestinal: Positive for nausea and vomiting. Negative for abdominal pain and diarrhea.   Physical Exam   Blood pressure (!) 101/54, pulse 95, temperature 98.4 F (36.9 C), temperature source Oral, resp. rate 16, height 5' (1.524 m), weight 64.7 kg, last menstrual period 06/02/2019, SpO2 100 %.  Physical Exam  Constitutional: She is oriented to person, place, and time. She appears well-developed and well-nourished. No distress.  Respiratory: Effort normal.  Musculoskeletal:        General: Normal range of motion.  Neurological: She is alert and oriented to person, place, and time. GCS eye subscore is 4. GCS verbal subscore is 5. GCS motor subscore is 6.  Skin: Skin is warm. She is not diaphoretic.  Psychiatric: Her behavior is normal.   Fetal Tracing: Baseline: 150 bpm Variability: Moderate  Accelerations: 10x10 Decelerations: None Toco: None  MAU Course  Procedures  None  MDM  Urine shows dehydration with > 80 ketones  Lr bolus X 1 MVI X 1 Headache cocktail given, patient does have a ride home. Headache now 0/10 Pepcid IV Patient tolerating PO fluids    Assessment and Plan   A:  1. [redacted] weeks gestation of pregnancy   2. Nausea/vomiting in pregnancy   3. Pregnancy headache, antepartum   4. Severe dehydration      P:  Discharge home in stable condition Rx: Reglan, Zofran. Ok to use Reglan vaginally if needed. Increase oral fluid intake Work note provided F/u with OB as scheduled, return to MAU if symptoms worsen Ok to use tylenol as directed on the bottle  Noni Saupe I, NP 11/17/2019 8:42 PM

## 2019-11-17 NOTE — MAU Note (Signed)
Jody Robinson is a 29 y.o. at [redacted]w[redacted]d here in MAU reporting: has been nauseated for 3 days. Has had a headache for 2 days. Tried tylenol for headache yesterday and it helped a little bit. Today has not been able to keep down any medication. Has been throwing up all day. Thinks she needs fluids. +FM. No VB or LOF. Denies contractions.  Onset of complaint: a few days  Pain score: 7/10  Vitals:   11/17/19 1634  BP: (!) 101/54  Pulse: 95  Resp: 16  Temp: 98.4 F (36.9 C)  SpO2: 100%     Lab orders placed from triage: UA

## 2019-11-17 NOTE — Telephone Encounter (Signed)
Call returned to pt. She stated that she has been having frequent round ligament pain for more than 2 weeks and she is having difficulty working. She works in [employer] and her job requires her to stand. She wants to know if there is anything we can give her to lessen the pain. I advised that she may take tylenol and when possible, she should rest. More than likely this problem will get better within a couple weeks. In some cases, intermittent FMLA can be approved for occasional missed work, however a provider would need to make that determination.  Per chart review, pt is currently being seen @ MAU due to nausea and vomiting. I advised pt to discuss her symptoms and concerns with the provider while she is there. Pt voiced understanding.

## 2019-11-17 NOTE — MAU Provider Note (Signed)
History   Jody Robinson is a 29 yo G3P1011 at [redacted]w[redacted]d who presents for a two day history of nausea, vomiting, and headache. She began feeling ill late in the day on Monday and has had persistent vomiting since Tuesday. She estimates that she is vomiting 6-7 times/day and has been unable to maintain PO intake. She says that she has vomited up everything she has tried to eat. Her vomit had been non-bloody non-bilious, but she reports that her most recent vomitus had trace blood in it. She has not tried any medications for her n/v.  Her headache started late in the day on Tuesday and has been refractory to Tylenol. She has a history of migraines. She endorses dizziness and floaters in her vision but denies any LOC.  She has denies any diarrhea, URI symptoms, or sick contacts. She was treated for GC three weeks ago, but denies any pelvic symptoms at this time (vaginal pain, discharge, bleeding).  She denies any dysuria, suprapubic, or back pain.   CSN: [id number]  Arrival date and time: 11/17/19 1605   First Provider Initiated Contact with Patient 11/17/19 1652      Chief Complaint  Patient presents with  . Headache  . Nausea  . Emesis     OB History    Gravida  3   Para  1   Term  1   Preterm      AB  1   Living  1     SAB  1   TAB      Ectopic      Multiple      Live Births  1           Past Medical History:  Diagnosis Date  . Abnormal Pap smear of cervix   . Depression   . Headache     Past Surgical History:  Procedure Laterality Date  . COLPOSCOPY      Family History  Problem Relation Age of Onset  . Diabetes Mother     Social History   Tobacco Use  . Smoking status: Never Smoker  . Smokeless tobacco: Never Used  Substance Use Topics  . Alcohol use: Not Currently    Comment: occasional  . Drug use: No    Allergies: No Known Allergies  Medications Prior to Admission  Medication Sig Dispense Refill Last Dose  . cyclobenzaprine (FLEXERIL)  10 MG tablet Take 1 tablet (10 mg total) by mouth 3 (three) times daily as needed for muscle spasms. 30 tablet 2   . Elastic Bandages & Supports (COMFORT FIT MATERNITY SUPP SM) MISC 1 Units by Does not apply route daily as needed. (Patient not taking: Reported on 11/03/2019) 1 each 0   . feeding supplement, ENSURE ENLIVE, (ENSURE ENLIVE) LIQD Take 237 mLs by mouth 2 (two) times daily between meals. (Patient not taking: Reported on 08/25/2019) 237 mL 14   . Prenatal Vit-Fe Fumarate-FA (PRENATAL VITAMINS PO) Take by mouth.       Review of Systems  Constitutional: Negative for fever.  HENT: Negative.   Eyes: Positive for visual disturbance.  Respiratory: Negative for shortness of breath.   Cardiovascular: Negative for chest pain, palpitations and leg swelling.  Gastrointestinal: Positive for vomiting. Negative for abdominal pain and diarrhea.  Endocrine: Negative.   Genitourinary: Negative for pelvic pain, vaginal bleeding, vaginal discharge and vaginal pain.  Musculoskeletal: Negative.   Skin: Negative.   Allergic/Immunologic: Negative.   Neurological: Positive for dizziness, light-headedness and headaches. Negative for syncope.  Hematological: Negative.   Psychiatric/Behavioral: Negative.    Physical Exam   Blood pressure (!) 101/54, pulse 95, temperature 98.4 F (36.9 C), temperature source Oral, resp. rate 16, height 5' (1.524 m), weight 64.7 kg, last menstrual period 06/02/2019, SpO2 100 %.  Physical Exam  Constitutional: She appears well-developed and well-nourished.  HENT:  Mucous membranes dry  Cardiovascular: Normal rate and regular rhythm. Exam reveals no gallop and no friction rub.  No murmur heard. Cap refill sluggish  Respiratory: Effort normal. No respiratory distress.  GI: She exhibits no distension. There is no abdominal tenderness. There is no rebound and no guarding.  Psychiatric: She has a normal mood and affect.    MAU Course   MDM: HA Cocktail  2L  LR Pepcid CMP & lipase  RECHECK 1900: Feeling much better after HA cocktail and 1 bag of fluids  Results for orders placed or performed during the hospital encounter of 11/17/19 (from the past 24 hour(s))  Urinalysis, Routine w reflex microscopic     Status: Abnormal   Collection Time: 11/17/19  4:41 PM  Result Value Ref Range   Color, Urine YELLOW YELLOW   APPearance CLEAR CLEAR   Specific Gravity, Urine 1.027 1.005 - 1.030   pH 7.0 5.0 - 8.0   Glucose, UA NEGATIVE NEGATIVE mg/dL   Hgb urine dipstick NEGATIVE NEGATIVE   Bilirubin Urine NEGATIVE NEGATIVE   Ketones, ur 80 (A) NEGATIVE mg/dL   Protein, ur 100 (A) NEGATIVE mg/dL   Nitrite NEGATIVE NEGATIVE   Leukocytes,Ua NEGATIVE NEGATIVE   RBC / HPF 0-5 0 - 5 RBC/hpf   WBC, UA 0-5 0 - 5 WBC/hpf   Bacteria, UA RARE (A) NONE SEEN   Squamous Epithelial / LPF 0-5 0 - 5   Mucus PRESENT    Hyaline Casts, UA PRESENT   Comprehensive metabolic panel     Status: Abnormal   Collection Time: 11/17/19  5:58 PM  Result Value Ref Range   Sodium 138 135 - 145 mmol/L   Potassium 3.5 3.5 - 5.1 mmol/L   Chloride 105 98 - 111 mmol/L   CO2 21 (L) 22 - 32 mmol/L   Glucose, Bld 81 70 - 99 mg/dL   BUN 6 6 - 20 mg/dL   Creatinine, Ser 0.63 0.44 - 1.00 mg/dL   Calcium 8.8 (L) 8.9 - 10.3 mg/dL   Total Protein 7.0 6.5 - 8.1 g/dL   Albumin 3.0 (L) 3.5 - 5.0 g/dL   AST 18 15 - 41 U/L   ALT 10 0 - 44 U/L   Alkaline Phosphatase 70 38 - 126 U/L   Total Bilirubin 0.9 0.3 - 1.2 mg/dL   GFR calc non Af Amer >60 >60 mL/min   GFR calc Af Amer >60 >60 mL/min   Anion gap 12 5 - 15  Lipase, blood     Status: None   Collection Time: 11/17/19  5:58 PM  Result Value Ref Range   Lipase 32 11 - 51 U/L    Assessment and Plan  N/V: Likely hyperemesis gravidarum Plan: Transition to PO Reglan for home PO phenergan for breakthrough nausea  Headache: Likely 2/2 dehydration 2/2 48 hr hx of vomiting Plan: PRN tylenol for pain Encourage paced PO fluid  intake   Pearla Dubonnet 11/17/2019, 7:00 PM

## 2019-11-17 NOTE — Discharge Instructions (Signed)
Nausea and Vomiting, Adult Nausea is feeling sick to your stomach or feeling that you are about to throw up (vomit). Vomiting is when food in your stomach is thrown up and out of the mouth. Throwing up can make you feel weak. It can also make you lose too much water in your body (get dehydrated). If you lose too much water in your body, you may:  Feel tired.  Feel thirsty.  Have a dry mouth.  Have cracked lips.  Go pee (urinate) less often. Older adults and people with other diseases or a weak body defense system (immune system) are at higher risk for losing too much water in the body. If you feel sick to your stomach and you throw up, it is important to follow instructions from your doctor about how to take care of yourself. Follow these instructions at home: Watch your symptoms for any changes. Tell your doctor about them. Follow these instructions to care for yourself at home. Eating and drinking      Take an ORS (oral rehydration solution). This is a drink that is sold at pharmacies and stores.  Drink clear fluids in small amounts as you are able, such as: ? Water. ? Ice chips. ? Fruit juice that has water added (diluted fruit juice). ? Low-calorie sports drinks.  Eat bland, easy-to-digest foods in small amounts as you are able, such as: ? Bananas. ? Applesauce. ? Rice. ? Low-fat (lean) meats. ? Toast. ? Crackers.  Avoid drinking fluids that have a lot of sugar or caffeine in them. This includes energy drinks, sports drinks, and soda.  Avoid alcohol.  Avoid spicy or fatty foods. General instructions  Take over-the-counter and prescription medicines only as told by your doctor.  Drink enough fluid to keep your pee (urine) pale yellow.  Wash your hands often with soap and water. If you cannot use soap and water, use hand sanitizer.  Make sure that all people in your home wash their hands well and often.  Rest at home while you get better.  Watch your condition  for any changes.  Take slow and deep breaths when you feel sick to your stomach.  Keep all follow-up visits as told by your doctor. This is important. Contact a doctor if:  Your symptoms get worse.  You have new symptoms.  You have a fever.  You cannot drink fluids without throwing up.  You feel sick to your stomach for more than 2 days.  You feel light-headed or dizzy.  You have a headache.  You have muscle cramps.  You have a rash.  You have pain while peeing. Get help right away if:  You have pain in your chest, neck, arm, or jaw.  You feel very weak or you pass out (faint).  You throw up again and again.  You have throw up that is bright red or looks like black coffee grounds.  You have bloody or black poop (stools) or poop that looks like tar.  You have a very bad headache, a stiff neck, or both.  You have very bad pain, cramping, or bloating in your belly (abdomen).  You have trouble breathing.  You are breathing very quickly.  Your heart is beating very quickly.  Your skin feels cold and clammy.  You feel confused.  You have signs of losing too much water in your body, such as: ? Dark pee, very little pee, or no pee. ? Cracked lips. ? Dry mouth. ? Sunken eyes. ?  Sleepiness. ? Weakness. These symptoms may be an emergency. Do not wait to see if the symptoms will go away. Get medical help right away. Call your local emergency services (911 in the [place] Do not drive yourself to the hospital. Summary  Nausea is feeling sick to your stomach or feeling that you are about to throw up (vomit). Vomiting is when food in your stomach is thrown up and out of the mouth.  Follow instructions from your doctor about eating and drinking to keep from losing too much water in your body.  Take over-the-counter and prescription medicines only as told by your doctor.  Contact your doctor if your symptoms get worse or you have new symptoms.  Keep all follow-up  visits as told by your doctor. This is important. This information is not intended to replace advice given to you by your health care provider. Make sure you discuss any questions you have with your health care provider. Document Revised: 11/20/2018 Document Reviewed: 01/06/2018 Elsevier Patient Education  2020 Elsevier Inc.  

## 2019-11-27 ENCOUNTER — Encounter (HOSPITAL_COMMUNITY): Payer: Self-pay | Admitting: Obstetrics & Gynecology

## 2019-11-27 ENCOUNTER — Other Ambulatory Visit: Payer: Self-pay

## 2019-11-27 ENCOUNTER — Inpatient Hospital Stay (HOSPITAL_COMMUNITY)
Admission: AD | Admit: 2019-11-27 | Discharge: 2019-11-28 | Disposition: A | Discharge status: Home / Self Care | Payer: 59 | Attending: Obstetrics & Gynecology | Admitting: Obstetrics & Gynecology

## 2019-11-27 DIAGNOSIS — O99282 Endocrine, nutritional and metabolic diseases complicating pregnancy, second trimester: Secondary | ICD-10-CM | POA: Diagnosis not present

## 2019-11-27 DIAGNOSIS — O219 Vomiting of pregnancy, unspecified: Secondary | ICD-10-CM

## 2019-11-27 DIAGNOSIS — Z3689 Encounter for other specified antenatal screening: Secondary | ICD-10-CM

## 2019-11-27 DIAGNOSIS — E876 Hypokalemia: Secondary | ICD-10-CM | POA: Insufficient documentation

## 2019-11-27 DIAGNOSIS — O212 Late vomiting of pregnancy: Secondary | ICD-10-CM | POA: Diagnosis present

## 2019-11-27 DIAGNOSIS — O0990 Supervision of high risk pregnancy, unspecified, unspecified trimester: Secondary | ICD-10-CM

## 2019-11-27 DIAGNOSIS — O26899 Other specified pregnancy related conditions, unspecified trimester: Secondary | ICD-10-CM

## 2019-11-27 DIAGNOSIS — Z3A25 25 weeks gestation of pregnancy: Secondary | ICD-10-CM | POA: Insufficient documentation

## 2019-11-27 DIAGNOSIS — D473 Essential (hemorrhagic) thrombocythemia: Secondary | ICD-10-CM

## 2019-11-27 DIAGNOSIS — D75839 Thrombocytosis, unspecified: Secondary | ICD-10-CM

## 2019-11-27 LAB — COMPREHENSIVE METABOLIC PANEL
ALT: 11 U/L (ref 0–44)
AST: 17 U/L (ref 15–41)
Albumin: 3.4 g/dL — ABNORMAL LOW (ref 3.5–5.0)
Alkaline Phosphatase: 71 U/L (ref 38–126)
Anion gap: 12 (ref 5–15)
BUN: 6 mg/dL (ref 6–20)
CO2: 21 mmol/L — ABNORMAL LOW (ref 22–32)
Calcium: 8.7 mg/dL — ABNORMAL LOW (ref 8.9–10.3)
Chloride: 102 mmol/L (ref 98–111)
Creatinine, Ser: 0.69 mg/dL (ref 0.44–1.00)
GFR calc Af Amer: >60 mL/min (ref >60)
GFR calc non Af Amer: >60 mL/min (ref >60)
Glucose, Bld: 93 mg/dL (ref 70–99)
Potassium: 3 mmol/L — ABNORMAL LOW (ref 3.5–5.1)
Sodium: 135 mmol/L (ref 135–145)
Total Bilirubin: 1.2 mg/dL (ref 0.3–1.2)
Total Protein: 7.7 g/dL (ref 6.5–8.1)

## 2019-11-27 LAB — CBC
HCT: 34.5 % — ABNORMAL LOW (ref 36.0–46.0)
Hemoglobin: 10.9 g/dL — ABNORMAL LOW (ref 12.0–15.0)
MCH: 26.4 pg (ref 26.0–34.0)
MCHC: 31.6 g/dL (ref 30.0–36.0)
MCV: 83.5 fL (ref 80.0–100.0)
Platelets: 727 10*3/uL — ABNORMAL HIGH (ref 150–400)
RBC: 4.13 MIL/uL (ref 3.87–5.11)
RDW: 14.2 % (ref 11.5–15.5)
WBC: 13.5 10*3/uL — ABNORMAL HIGH (ref 4.0–10.5)
nRBC: 0.1 % (ref 0.0–0.2)

## 2019-11-27 LAB — URINALYSIS, ROUTINE W REFLEX MICROSCOPIC
Glucose, UA: NEGATIVE mg/dL
Hgb urine dipstick: NEGATIVE
Ketones, ur: 80 mg/dL — AB
Leukocytes,Ua: NEGATIVE
Nitrite: NEGATIVE
Protein, ur: 100 mg/dL — AB
Specific Gravity, Urine: 1.033 — ABNORMAL HIGH (ref 1.005–1.030)
pH: 6 (ref 5.0–8.0)

## 2019-11-27 MED ORDER — SODIUM CHLORIDE 0.9 % IV SOLN
8.0000 mg | Freq: Once | INTRAVENOUS | Status: AC
  Administered 2019-11-27: 8 mg via INTRAVENOUS
  Filled 2019-11-27: qty 4

## 2019-11-27 MED ORDER — FAMOTIDINE IN NACL 20-0.9 MG/50ML-% IV SOLN
20.0000 mg | Freq: Once | INTRAVENOUS | Status: AC
  Administered 2019-11-27: 21:00:00 20 mg via INTRAVENOUS
  Filled 2019-11-27: qty 50

## 2019-11-27 MED ORDER — LACTATED RINGERS IV BOLUS
1000.0000 mL | Freq: Once | INTRAVENOUS | Status: AC
  Administered 2019-11-27: 1000 mL via INTRAVENOUS

## 2019-11-27 MED ORDER — SODIUM CHLORIDE 0.9 % IV SOLN
Freq: Once | INTRAVENOUS | Status: AC
  Filled 2019-11-27: qty 1000

## 2019-11-27 NOTE — MAU Provider Note (Signed)
History     CSN: [id number]  Arrival date and time: 11/27/19 1842   First Provider Initiated Contact with Patient 11/27/19 2016      Chief Complaint  Patient presents with  . Emesis  . Dizziness   Jody Robinson is a 29 y.o. G3P1011 at [redacted]w[redacted]d who receives care at [facility].  She presents today for Emesis and Dizziness.  She states she has thrown up about 8x today despite taking her reglan this morning.  She reports she "hasn't been feeling good for the last two weeks."  She reports she has been "throwing up all the time and had to leave work early on Friday."  She states she is only taking reglan and prenatal vitamins.  Patient reports eating some apple slices this afternoon and a chicken mcgriddle this morning.  However, she was unable to keep these foods down.  She reports she ate potatoes and steak yesterday..."a couple bites."  She endorses fetal movement and denies abdominal cramping or contractions.  She also denies vaginal concerns including discharge, bleeding, and leaking.       OB History    Gravida  3   Para  1   Term  1   Preterm      AB  1   Living  1     SAB  1   TAB      Ectopic      Multiple      Live Births  1           Past Medical History:  Diagnosis Date  . Abnormal Pap smear of cervix   . Depression   . Headache     Past Surgical History:  Procedure Laterality Date  . COLPOSCOPY      Family History  Problem Relation Age of Onset  . Diabetes Mother     Social History   Tobacco Use  . Smoking status: Never Smoker  . Smokeless tobacco: Never Used  Substance Use Topics  . Alcohol use: Not Currently    Comment: occasional  . Drug use: No    Allergies: No Known Allergies  Medications Prior to Admission  Medication Sig Dispense Refill Last Dose  . cyclobenzaprine (FLEXERIL) 10 MG tablet Take 1 tablet (10 mg total) by mouth 3 (three) times daily as needed for muscle spasms. 30 tablet 2   . Elastic Bandages & Supports  (COMFORT FIT MATERNITY SUPP SM) MISC 1 Units by Does not apply route daily as needed. (Patient not taking: Reported on 11/03/2019) 1 each 0   . feeding supplement, ENSURE ENLIVE, (ENSURE ENLIVE) LIQD Take 237 mLs by mouth 2 (two) times daily between meals. (Patient not taking: Reported on 08/25/2019) 237 mL 14   . metoCLOPramide (REGLAN) 10 MG tablet Take 1 tablet (10 mg total) by mouth 3 (three) times daily with meals. 90 tablet 1   . ondansetron (ZOFRAN) 4 MG tablet Take 1 tablet (4 mg total) by mouth daily as needed for nausea or vomiting. 30 tablet 1   . Prenatal Vit-Fe Fumarate-FA (PRENATAL VITAMINS PO) Take by mouth.       Review of Systems  Constitutional: Negative for chills and fever.  Respiratory: Positive for cough. Negative for chest tightness and shortness of breath.   Cardiovascular: Negative for chest pain.  Gastrointestinal: Positive for nausea and vomiting. Negative for abdominal pain.  Genitourinary: Negative for difficulty urinating, dysuria, pelvic pain, vaginal bleeding and vaginal discharge.  Musculoskeletal: Positive for back pain.  Neurological: Positive for  dizziness and headaches. Negative for light-headedness.   Physical Exam   Blood pressure 117/72, pulse (!) 105, temperature 98.3 F (36.8 C), temperature source Oral, resp. rate 17, last menstrual period 06/02/2019, SpO2 100 %.  Physical Exam  Constitutional: She is oriented to person, place, and time. She appears well-developed and well-nourished. No distress.  HENT:  Head: Normocephalic and atraumatic.  Eyes: Conjunctivae are normal.  Cardiovascular: Normal rate.  Respiratory: Effort normal. No respiratory distress.  GI: Soft.  Musculoskeletal:        General: Normal range of motion.     Cervical back: Normal range of motion.  Neurological: She is alert and oriented to person, place, and time.  Psychiatric: She has a normal mood and affect. Her behavior is normal.    Fetal Assessment 145 bpm, Mod Var,  -Decels, +Accels Toco: None graphed  MAU Course   Results for orders placed or performed during the hospital encounter of 11/27/19 (from the past 24 hour(s))  CBC     Status: Abnormal   Collection Time: 11/27/19  8:21 PM  Result Value Ref Range   WBC 13.5 (H) 4.0 - 10.5 K/uL   RBC 4.13 3.87 - 5.11 MIL/uL   Hemoglobin 10.9 (L) 12.0 - 15.0 g/dL   HCT 34.5 (L) 36.0 - 46.0 %   MCV 83.5 80.0 - 100.0 fL   MCH 26.4 26.0 - 34.0 pg   MCHC 31.6 30.0 - 36.0 g/dL   RDW 14.2 11.5 - 15.5 %   Platelets 727 (H) 150 - 400 K/uL   nRBC 0.1 0.0 - 0.2 %  Comprehensive metabolic panel     Status: Abnormal   Collection Time: 11/27/19  8:21 PM  Result Value Ref Range   Sodium 135 135 - 145 mmol/L   Potassium 3.0 (L) 3.5 - 5.1 mmol/L   Chloride 102 98 - 111 mmol/L   CO2 21 (L) 22 - 32 mmol/L   Glucose, Bld 93 70 - 99 mg/dL   BUN 6 6 - 20 mg/dL   Creatinine, Ser 0.69 0.44 - 1.00 mg/dL   Calcium 8.7 (L) 8.9 - 10.3 mg/dL   Total Protein 7.7 6.5 - 8.1 g/dL   Albumin 3.4 (L) 3.5 - 5.0 g/dL   AST 17 15 - 41 U/L   ALT 11 0 - 44 U/L   Alkaline Phosphatase 71 38 - 126 U/L   Total Bilirubin 1.2 0.3 - 1.2 mg/dL   GFR calc non Af Amer >60 >60 mL/min   GFR calc Af Amer >60 >60 mL/min   Anion gap 12 5 - 15   No results found.  MDM PE Labs: CBC, CMP, UA EFM Start IV LR Bolus f/b Banana Bag Antiemetic PPI Assessment and Plan  29 year old G3P1011  SIUP at 25.3weeks Cat I FT Nausea/Vomiting   -POC reviewed -Start IV with fluids. -Will give Zofran 8mg  and Pepcid 20mg  -Patient without questions or concerns. -NST Reactive for GA and okay to discontinue.   Maryann Conners MSN, CNM 11/27/2019, 8:17 PM   Reassessment (9:49 PM) Hypokalemia   -Reviewed lab results with patient. -Informed that banana bag will also include potassium. -Patient reports improvement in nausea with zofran dosing. -Patient reports still unable to leave urine.  -Will continue to monitor.  Reassessment (12:13  AM) -Patient reports continued improvement in nausea. -Instructed to take reglan once home. -States next appt is May 4th. -No questions or concerns. -Precautions given. -Encouraged to call or return to MAU if symptoms worsen  or with the onset of new symptoms. -Patient to complete fluids prior to discharge.  -Discharged to home in stable condition.  Maryann Conners MSN, CNM Advanced Practice Provider, [facility]

## 2019-11-27 NOTE — MAU Note (Signed)
Pt states she has been vomiting multiple times a day since last week. States the N/V has never really gone away but has not been like this since the beginning. States she has felt light headed and dizzy since yesterday. Reports being able to drink some but not eat very much. Denies LOF/VB/pain. Reports good fetal movement. Took reglan this morning but states it didn't help.

## 2019-11-28 DIAGNOSIS — O219 Vomiting of pregnancy, unspecified: Secondary | ICD-10-CM

## 2019-11-28 DIAGNOSIS — Z3A25 25 weeks gestation of pregnancy: Secondary | ICD-10-CM

## 2019-11-28 NOTE — Discharge Instructions (Signed)
Bland Diet A bland diet consists of foods that are often soft and do not have a lot of fat, fiber, or extra seasonings. Foods without fat, fiber, or seasoning are easier for the body to digest. They are also less likely to irritate your mouth, throat, stomach, and other parts of your digestive system. A bland diet is sometimes called a BRAT diet. What is my plan? Your health care provider or food and nutrition specialist (dietitian) may recommend specific changes to your diet to prevent symptoms or to treat your symptoms. These changes may include:  Eating small meals often.  Cooking food until it is soft enough to chew easily.  Chewing your food well.  Drinking fluids slowly.  Not eating foods that are very spicy, sour, or fatty.  Not eating citrus fruits, such as oranges and grapefruit. What do I need to know about this diet?  Eat a variety of foods from the bland diet food list.  Do not follow a bland diet longer than needed.  Ask your health care provider whether you should take vitamins or supplements. What foods can I eat? Grains  Hot cereals, such as cream of wheat. Rice. Bread, crackers, or tortillas made from refined white flour. Vegetables Canned or cooked vegetables. Mashed or boiled potatoes. Fruits  Bananas. Applesauce. Other types of cooked or canned fruit with the skin and seeds removed, such as canned peaches or pears. Meats and other proteins  Scrambled eggs. Creamy peanut butter or other nut butters. Lean, well-cooked meats, such as chicken or fish. Tofu. Soups or broths. Dairy Low-fat dairy products, such as milk, cottage cheese, or yogurt. Beverages  Water. Herbal tea. Apple juice. Fats and oils Mild salad dressings. Canola or olive oil. Sweets and desserts Pudding. Custard. Fruit gelatin. Ice cream. The items listed above may not be a complete list of recommended foods and beverages. Contact a dietitian for more options. What foods are not  recommended? Grains Whole grain breads and cereals. Vegetables Raw vegetables. Fruits Raw fruits, especially citrus, berries, or dried fruits. Dairy Whole fat dairy foods. Beverages Caffeinated drinks. Alcohol. Seasonings and condiments Strongly flavored seasonings or condiments. Hot sauce. Salsa. Other foods Spicy foods. Fried foods. Sour foods, such as pickled or fermented foods. Foods with high sugar content. Foods high in fiber. The items listed above may not be a complete list of foods and beverages to avoid. Contact a dietitian for more information. Summary  A bland diet consists of foods that are often soft and do not have a lot of fat, fiber, or extra seasonings.  Foods without fat, fiber, or seasoning are easier for the body to digest.  Check with your health care provider to see how long you should follow this diet plan. It is not meant to be followed for long periods. This information is not intended to replace advice given to you by your health care provider. Make sure you discuss any questions you have with your health care provider. Document Revised: 08/27/2017 Document Reviewed: 08/27/2017 Elsevier Patient Education  2020 Elsevier Inc.  

## 2019-11-30 ENCOUNTER — Telehealth: Payer: Self-pay | Admitting: Family Medicine

## 2019-11-30 NOTE — Telephone Encounter (Signed)
Patient is calling requesting to speak with a nurse or a Provider about getting medical leave, she state that she feel light headed and dizzy

## 2019-12-02 NOTE — Telephone Encounter (Signed)
Patient left voice mail this am wanting to discuss getting written out of work due to nausea. Linda,RN

## 2019-12-06 ENCOUNTER — Other Ambulatory Visit: Payer: 59

## 2019-12-06 ENCOUNTER — Encounter: Payer: 59 | Admitting: Obstetrics and Gynecology

## 2019-12-07 NOTE — Telephone Encounter (Signed)
Attempted to call pt unable to leave message due to voice mail box being full.  Mel Almond, RN

## 2019-12-07 NOTE — Telephone Encounter (Signed)
Pt returned call and stated that she needed a work letter because she stands on her feet all day at work and she has pressure and pelvic pain.  I informed the pt that we can not medically take her out of work for normal sx's of pregnancy.  I advised pt that we can provide a generic work restriction letter however it is up to her workplace to honor that.  Pt stated that she will come today to pick up the letter.    Mel Almond, RN

## 2019-12-13 ENCOUNTER — Encounter: Payer: 59 | Admitting: Student

## 2019-12-14 ENCOUNTER — Ambulatory Visit (INDEPENDENT_AMBULATORY_CARE_PROVIDER_SITE_OTHER): Payer: 59 | Admitting: Advanced Practice Midwife

## 2019-12-14 ENCOUNTER — Other Ambulatory Visit: Payer: Self-pay | Admitting: *Deleted

## 2019-12-14 ENCOUNTER — Other Ambulatory Visit: Payer: 59

## 2019-12-14 ENCOUNTER — Other Ambulatory Visit: Payer: Self-pay

## 2019-12-14 VITALS — BP 120/68 | HR 87 | Wt 144.8 lb

## 2019-12-14 DIAGNOSIS — O0990 Supervision of high risk pregnancy, unspecified, unspecified trimester: Secondary | ICD-10-CM

## 2019-12-14 DIAGNOSIS — M549 Dorsalgia, unspecified: Secondary | ICD-10-CM

## 2019-12-14 DIAGNOSIS — Z3A27 27 weeks gestation of pregnancy: Secondary | ICD-10-CM

## 2019-12-14 DIAGNOSIS — O99891 Other specified diseases and conditions complicating pregnancy: Secondary | ICD-10-CM

## 2019-12-14 DIAGNOSIS — R7989 Other specified abnormal findings of blood chemistry: Secondary | ICD-10-CM

## 2019-12-14 DIAGNOSIS — Z8759 Personal history of other complications of pregnancy, childbirth and the puerperium: Secondary | ICD-10-CM

## 2019-12-14 NOTE — Progress Notes (Signed)
   PRENATAL VISIT NOTE  Subjective:  Jody Robinson is a 29 y.o. G3P1011 at [redacted]w[redacted]d being seen today for ongoing prenatal care.  She is currently monitored for the following issues for this high-risk pregnancy and has History of IUFD; Abnormal LFTs; Supervision of high risk pregnancy, antepartum; Depression; Nausea/vomiting in pregnancy; Hyperemesis gravidarum; Seasonal allergies; Herpes simplex; History of abnormal cervical Pap smear; Thrombocytosis ([place]); Pancreatitis, acute; Gallbladder sludge; COVID-19; Low TSH level; and Abdominal cramping affecting pregnancy on their problem list.  Patient reports ongoing back pain associated with prolonged standing at work. Patient states she has spoken with her manager who then referred her to the [place] but she hasn't heard back from HR regarding her work restrictions. She is wearing a maternity belt she purchased from [employer] and taking Tylenol PRN.  Contractions: Not present. Vag. Bleeding: None.  Movement: Present. Denies leaking of fluid.   The following portions of the patient's history were reviewed and updated as appropriate: allergies, current medications, past family history, past medical history, past social history, past surgical history and problem list. Problem list updated.  Objective:   Vitals:   12/14/19 1043  BP: 120/68  Pulse: 87  Weight: 144 lb 12.8 oz (65.7 kg)    Fetal Status: Fetal Heart Rate (bpm): 147   Movement: Present     General:  Alert, oriented and cooperative. Patient is in no acute distress.  Skin: Skin is warm and dry. No rash noted.   Cardiovascular: Normal heart rate noted  Respiratory: Normal respiratory effort, no problems with respiration noted  Abdomen: Soft, gravid, appropriate for gestational age.  Pain/Pressure: Present     Pelvic: Cervical exam deferred        Extremities: Normal range of motion.  Edema: None  Mental Status: Normal mood and affect. Normal behavior. Normal judgment and thought  content.   Assessment and Plan:  Pregnancy: G3P1011 at [redacted]w[redacted]d  1. Supervision of high risk pregnancy, antepartum - Patient unable to make this morning's appointment for GTT   2. Back pain affecting pregnancy in second trimester - Advised return to prescribed maternity belt rather than loose [employer] belt - Continue Tylenol PRN - Consider compression socks while working - Work restriction letter edited to allow for more frequent rest breaks, chair, access to free water  3. Low TSH level - Low TSH 08/2019 - Normal TSH, T3 and T4 10/04/2019 - Will recheck tomorrow when GTT is collected   Preterm labor symptoms and general obstetric precautions including but not limited to vaginal bleeding, contractions, leaking of fluid and fetal movement were reviewed in detail with the patient. Please refer to After Visit Summary for other counseling recommendations.  No follow-ups on file.  Future Appointments  Date Time Provider [place]  12/15/2019  8:40 AM [facility]  01/04/2020  4:15 PM [place], CNM [facility]  01/12/2020  3:30 PM [facility] US5 [facility] [place], CNM

## 2019-12-14 NOTE — Patient Instructions (Signed)
Glucose Tolerance Test During Pregnancy Why am I having this test? The glucose tolerance test (GTT) is done to check how your body processes sugar (glucose). This is one of several tests used to diagnose diabetes that develops during pregnancy (gestational diabetes mellitus). Gestational diabetes is a temporary form of diabetes that some women develop during pregnancy. It usually occurs during the second trimester of pregnancy and goes away after delivery. Testing (screening) for gestational diabetes usually occurs between 24 and 28 weeks of pregnancy. You may have the GTT test after having a 1-hour glucose screening test if the results from that test indicate that you may have gestational diabetes. You may also have this test if:  You have a history of gestational diabetes.  You have a history of giving birth to very large babies or have experienced repeated fetal loss (stillbirth).  You have signs and symptoms of diabetes, such as: ? Changes in your vision. ? Tingling or numbness in your hands or feet. ? Changes in hunger, thirst, and urination that are not otherwise explained by your pregnancy. What is being tested? This test measures the amount of glucose in your blood at different times during a period of 3 hours. This indicates how well your body is able to process glucose. What kind of sample is taken?  Blood samples are required for this test. They are usually collected by inserting a needle into a blood vessel. How do I prepare for this test?  For 3 days before your test, eat normally. Have plenty of carbohydrate-rich foods.  Follow instructions from your health care provider about: ? Eating or drinking restrictions on the day of the test. You may be asked to not eat or drink anything other than water (fast) starting 8-10 hours before the test. ? Changing or stopping your regular medicines. Some medicines may interfere with this test. Tell a health care provider about:  All  medicines you are taking, including vitamins, herbs, eye drops, creams, and over-the-counter medicines.  Any blood disorders you have.  Any surgeries you have had.  Any medical conditions you have. What happens during the test? First, your blood glucose will be measured. This is referred to as your fasting blood glucose, since you fasted before the test. Then, you will drink a glucose solution that contains a certain amount of glucose. Your blood glucose will be measured again 1, 2, and 3 hours after drinking the solution. This test takes about 3 hours to complete. You will need to stay at the testing location during this time. During the testing period:  Do not eat or drink anything other than the glucose solution.  Do not exercise.  Do not use any products that contain nicotine or tobacco, such as cigarettes and e-cigarettes. If you need help stopping, ask your health care provider. The testing procedure may vary among health care providers and hospitals. How are the results reported? Your results will be reported as milligrams of glucose per deciliter of blood (mg/dL) or millimoles per liter (mmol/L). Your health care provider will compare your results to normal ranges that were established after testing a large group of people (reference ranges). Reference ranges may vary among labs and hospitals. For this test, common reference ranges are:  Fasting: less than 95-105 mg/dL (5.3-5.8 mmol/L).  1 hour after drinking glucose: less than 180-190 mg/dL (10.0-10.5 mmol/L).  2 hours after drinking glucose: less than 155-165 mg/dL (8.6-9.2 mmol/L).  3 hours after drinking glucose: 140-145 mg/dL (7.8-8.1 mmol/L). What do the   results mean? Results within reference ranges are considered normal, meaning that your glucose levels are well-controlled. If two or more of your blood glucose levels are high, you may be diagnosed with gestational diabetes. If only one level is high, your health care  provider may suggest repeat testing or other tests to confirm a diagnosis. Talk with your health care provider about what your results mean. Questions to ask your health care provider Ask your health care provider, or the department that is doing the test:  When will my results be ready?  How will I get my results?  What are my treatment options?  What other tests do I need?  What are my next steps? Summary  The glucose tolerance test (GTT) is one of several tests used to diagnose diabetes that develops during pregnancy (gestational diabetes mellitus). Gestational diabetes is a temporary form of diabetes that some women develop during pregnancy.  You may have the GTT test after having a 1-hour glucose screening test if the results from that test indicate that you may have gestational diabetes. You may also have this test if you have any symptoms or risk factors for gestational diabetes.  Talk with your health care provider about what your results mean. This information is not intended to replace advice given to you by your health care provider. Make sure you discuss any questions you have with your health care provider. Document Revised: 11/19/2018 Document Reviewed: 03/10/2017 Elsevier Patient Education  2020 Elsevier Inc.  

## 2019-12-15 ENCOUNTER — Other Ambulatory Visit: Payer: 59

## 2019-12-15 DIAGNOSIS — Z8759 Personal history of other complications of pregnancy, childbirth and the puerperium: Secondary | ICD-10-CM

## 2019-12-15 DIAGNOSIS — O0990 Supervision of high risk pregnancy, unspecified, unspecified trimester: Secondary | ICD-10-CM

## 2019-12-15 DIAGNOSIS — R7989 Other specified abnormal findings of blood chemistry: Secondary | ICD-10-CM

## 2019-12-21 ENCOUNTER — Other Ambulatory Visit: Payer: Self-pay | Admitting: *Deleted

## 2019-12-21 ENCOUNTER — Other Ambulatory Visit: Payer: 59

## 2019-12-21 ENCOUNTER — Other Ambulatory Visit: Payer: Self-pay

## 2019-12-21 DIAGNOSIS — O0990 Supervision of high risk pregnancy, unspecified, unspecified trimester: Secondary | ICD-10-CM

## 2019-12-21 DIAGNOSIS — R7989 Other specified abnormal findings of blood chemistry: Secondary | ICD-10-CM

## 2019-12-21 DIAGNOSIS — O210 Mild hyperemesis gravidarum: Secondary | ICD-10-CM

## 2019-12-21 LAB — CBC

## 2019-12-21 LAB — RPR: RPR Ser Ql: NONREACTIVE

## 2019-12-21 LAB — TSH: TSH: 0.68 u[IU]/mL (ref 0.450–4.500)

## 2019-12-21 LAB — HIV ANTIBODY (ROUTINE TESTING W REFLEX): HIV Screen 4th Generation wRfx: NONREACTIVE

## 2019-12-21 LAB — T3, FREE: T3, Free: 2.1 pg/mL (ref 2.0–4.4)

## 2019-12-21 LAB — GLUCOSE TOLERANCE, 2 HOURS W/ 1HR: Glucose, Fasting: 81 mg/dL (ref 65–91)

## 2019-12-21 LAB — T4, FREE: Free T4: 0.86 ng/dL (ref 0.82–1.77)

## 2019-12-22 ENCOUNTER — Telehealth: Payer: Self-pay | Admitting: *Deleted

## 2019-12-22 DIAGNOSIS — O0990 Supervision of high risk pregnancy, unspecified, unspecified trimester: Secondary | ICD-10-CM

## 2019-12-22 DIAGNOSIS — O99019 Anemia complicating pregnancy, unspecified trimester: Secondary | ICD-10-CM

## 2019-12-22 DIAGNOSIS — D75839 Thrombocytosis, unspecified: Secondary | ICD-10-CM

## 2019-12-22 LAB — RPR: RPR Ser Ql: NONREACTIVE

## 2019-12-22 LAB — GLUCOSE TOLERANCE, 2 HOURS W/ 1HR
Glucose, 1 hour: 154 mg/dL (ref 65–179)
Glucose, 2 hour: 140 mg/dL (ref 65–152)
Glucose, Fasting: 83 mg/dL (ref 65–91)

## 2019-12-22 LAB — CBC
Hematocrit: 26.7 % — ABNORMAL LOW (ref 34.0–46.6)
Hemoglobin: 8.5 g/dL — ABNORMAL LOW (ref 11.1–15.9)
MCH: 24.9 pg — ABNORMAL LOW (ref 26.6–33.0)
MCHC: 31.8 g/dL (ref 31.5–35.7)
MCV: 78 fL — ABNORMAL LOW (ref 79–97)
Platelets: 559 10*3/uL — ABNORMAL HIGH (ref 150–450)
RBC: 3.42 x10E6/uL — ABNORMAL LOW (ref 3.77–5.28)
RDW: 13.8 % (ref 11.7–15.4)
WBC: 14.9 10*3/uL — ABNORMAL HIGH (ref 3.4–10.8)

## 2019-12-22 LAB — HIV ANTIBODY (ROUTINE TESTING W REFLEX): HIV Screen 4th Generation wRfx: NONREACTIVE

## 2019-12-22 MED ORDER — FERROUS SULFATE 325 (65 FE) MG PO TABS: 325.0000 mg | ORAL_TABLET | ORAL | 1 refills | Status: DC

## 2019-12-22 NOTE — Telephone Encounter (Signed)
I called and notified Jody Robinson of her cbc and plan per S. Jeronimo Greaves, CNM for her to take iron 325 mg by mouth every other day. Advised to take with orange juice and may need to take with food if bothers stomach. Encouraged [name] to let us know if it causes constipation, etc. She voices understanding. RX sent.  Lisanne Ponce,RN

## 2019-12-22 NOTE — Progress Notes (Signed)
Thanks, this confirms what we discussed on the phone last night and I had results in my inbox this morning :) Appreciate you taking the time to circle back with me!

## 2019-12-22 NOTE — Telephone Encounter (Signed)
-----   Message from Darlina Rumpf, [place] sent at 12/22/2019  9:18 AM EDT ----- Previously diagnosed Thrombocytosis. Hgb 8.5, please notify patient and send iron supplement, take every other day.

## 2019-12-24 ENCOUNTER — Inpatient Hospital Stay (HOSPITAL_COMMUNITY)
Admission: AD | Admit: 2019-12-24 | Discharge: 2019-12-25 | Disposition: A | Discharge status: Home / Self Care | Payer: 59 | Attending: Family Medicine | Admitting: Family Medicine

## 2019-12-24 ENCOUNTER — Encounter (HOSPITAL_COMMUNITY): Payer: Self-pay | Admitting: Family Medicine

## 2019-12-24 ENCOUNTER — Other Ambulatory Visit: Payer: Self-pay

## 2019-12-24 DIAGNOSIS — O26899 Other specified pregnancy related conditions, unspecified trimester: Secondary | ICD-10-CM

## 2019-12-24 DIAGNOSIS — R519 Headache, unspecified: Secondary | ICD-10-CM

## 2019-12-24 DIAGNOSIS — Z833 Family history of diabetes mellitus: Secondary | ICD-10-CM | POA: Diagnosis not present

## 2019-12-24 DIAGNOSIS — R109 Unspecified abdominal pain: Secondary | ICD-10-CM

## 2019-12-24 DIAGNOSIS — O0990 Supervision of high risk pregnancy, unspecified, unspecified trimester: Secondary | ICD-10-CM

## 2019-12-24 DIAGNOSIS — O26893 Other specified pregnancy related conditions, third trimester: Secondary | ICD-10-CM | POA: Diagnosis not present

## 2019-12-24 DIAGNOSIS — Z3A29 29 weeks gestation of pregnancy: Secondary | ICD-10-CM | POA: Diagnosis not present

## 2019-12-24 DIAGNOSIS — O219 Vomiting of pregnancy, unspecified: Secondary | ICD-10-CM

## 2019-12-24 DIAGNOSIS — Z3689 Encounter for other specified antenatal screening: Secondary | ICD-10-CM

## 2019-12-24 DIAGNOSIS — D75839 Thrombocytosis, unspecified: Secondary | ICD-10-CM

## 2019-12-24 MED ORDER — CYCLOBENZAPRINE HCL 5 MG PO TABS
10.0000 mg | ORAL_TABLET | Freq: Once | ORAL | Status: AC
  Administered 2019-12-24: 10 mg via ORAL
  Filled 2019-12-24: qty 2

## 2019-12-24 MED ORDER — LACTATED RINGERS IV BOLUS: 1000.0000 mL | Freq: Once | INTRAVENOUS | Status: DC

## 2019-12-24 MED ORDER — FAMOTIDINE IN NACL 20-0.9 MG/50ML-% IV SOLN: 20.0000 mg | Freq: Once | INTRAVENOUS | Status: DC

## 2019-12-24 MED ORDER — SODIUM CHLORIDE 0.9 % IV SOLN
8.0000 mg | Freq: Once | INTRAVENOUS | Status: DC
  Filled 2019-12-24: qty 4

## 2019-12-24 NOTE — MAU Note (Signed)
Pt reports that she has had a headache since this am.  She took Benadryl because she did not have Tylenol.  Was not effective.  +FM.

## 2019-12-24 NOTE — MAU Provider Note (Signed)
History     CSN: [id number]  Arrival date and time: 12/24/19 2145   First Provider Initiated Contact with Patient 12/24/19 2239      Chief Complaint  Patient presents with  . Headache   Jody Robinson is a 29 y.o. G3P1011 at [redacted]w[redacted]d who receives care at [facility].  She presents today for Headache. She states her headache was present upon waking and has been constant.  She states she has taken benadryl and "tried to sleep it off" without any improvement.  She rates the headache a 7/10 currently.  Patient reports drinking lots of water today and endorses fetal movement.  She denies abdominal cramping or contractions.      OB History    Gravida  3   Para  1   Term  1   Preterm      AB  1   Living  1     SAB  1   TAB      Ectopic      Multiple      Live Births  1           Past Medical History:  Diagnosis Date  . Abnormal Pap smear of cervix   . Depression   . Headache     Past Surgical History:  Procedure Laterality Date  . COLPOSCOPY      Family History  Problem Relation Age of Onset  . Diabetes Mother     Social History   Tobacco Use  . Smoking status: Never Smoker  . Smokeless tobacco: Never Used  Substance Use Topics  . Alcohol use: Not Currently    Comment: occasional  . Drug use: No    Allergies: No Known Allergies  Medications Prior to Admission  Medication Sig Dispense Refill Last Dose  . Elastic Bandages & Supports (COMFORT FIT MATERNITY SUPP SM) MISC 1 Units by Does not apply route daily as needed. (Patient not taking: Reported on 11/03/2019) 1 each 0   . feeding supplement, ENSURE ENLIVE, (ENSURE ENLIVE) LIQD Take 237 mLs by mouth 2 (two) times daily between meals. (Patient not taking: Reported on 08/25/2019) 237 mL 14   . ferrous sulfate 325 (65 FE) MG tablet Take 1 tablet (325 mg total) by mouth every other day. 30 tablet 1   . metoCLOPramide (REGLAN) 10 MG tablet Take 1 tablet (10 mg total) by mouth 3 (three) times daily with  meals. 90 tablet 1   . ondansetron (ZOFRAN) 4 MG tablet Take 1 tablet (4 mg total) by mouth daily as needed for nausea or vomiting. 30 tablet 1   . Prenatal Vit-Fe Fumarate-FA (PRENATAL VITAMINS PO) Take by mouth.       Review of Systems  Constitutional: Negative for chills and fever.  Eyes: Negative for visual disturbance.  Respiratory: Negative for cough and shortness of breath.   Gastrointestinal: Positive for nausea (Earlier today, none currently) and vomiting (Earlier today, none currently). Negative for abdominal pain.  Genitourinary: Negative for difficulty urinating, dysuria, vaginal bleeding and vaginal discharge.  Neurological: Positive for dizziness and headaches. Negative for light-headedness.   Physical Exam   Blood pressure 107/70, pulse (!) 101, temperature 98.6 F (37 C), temperature source Oral, resp. rate 18, weight 63.2 kg, last menstrual period 06/02/2019, SpO2 97 %.  Physical Exam  Constitutional: She is oriented to person, place, and time. She appears well-developed and well-nourished. No distress.  HENT:  Head: Normocephalic and atraumatic.  Eyes: Conjunctivae are normal.  Cardiovascular: Normal rate.  Respiratory: Effort normal. No respiratory distress.  GI: Soft.  Musculoskeletal:        General: Normal range of motion.     Cervical back: Normal range of motion.  Neurological: She is alert and oriented to person, place, and time.  Skin: Skin is warm and dry.  Psychiatric: She has a normal mood and affect. Her behavior is normal.    Fetal Assessment 150 bpm, Mod Var, -Decels, +15x15Accels Toco:  None  MAU Course  No results found for this or any previous visit (from the past 24 hour(s)). No results found.  MDM PE Labs:UA EFM  Assessment and Plan  29 year old G3P1011  SIUP at 29.2weeks Cat I FT Headache  -POC reviewed. -Exam performed and findings discussed. -Will give flexeril and reassess.    Maryann Conners MSN, CNM 12/24/2019, 10:40  PM    Reassessment (12:34 AM) -Patient reports improvement in HA; now 2-3/10. -Agreeable to flexeril for home usage. -Rx for flexeril 5mg  sent to pharmacy on file.  -Instructed to keep appt as scheduled for [facility] -Encouraged to call or return to MAU if symptoms worsen or with the onset of new symptoms. -Discharged to home in improved condition.  Maryann Conners MSN, CNM Advanced Practice Provider, [facility]

## 2019-12-25 DIAGNOSIS — R519 Headache, unspecified: Secondary | ICD-10-CM

## 2019-12-25 DIAGNOSIS — Z3A29 29 weeks gestation of pregnancy: Secondary | ICD-10-CM

## 2019-12-25 DIAGNOSIS — O26893 Other specified pregnancy related conditions, third trimester: Secondary | ICD-10-CM

## 2019-12-25 MED ORDER — CYCLOBENZAPRINE HCL 5 MG PO TABS
5.0000 mg | ORAL_TABLET | Freq: Two times a day (BID) | ORAL | 0 refills | Status: DC | PRN
Start: 2019-12-25 — End: 2020-03-03

## 2019-12-25 NOTE — Discharge Instructions (Signed)
General Headache Without Cause A headache is pain or discomfort felt around the head or neck area. The specific cause of a headache may not be found. There are many causes and types of headaches. A few common ones are:  Tension headaches.  Migraine headaches.  Cluster headaches.  Chronic daily headaches. Follow these instructions at home: Watch your condition for any changes. Let your health care provider know about them. Take these steps to help with your condition: Managing pain      Take over-the-counter and prescription medicines only as told by your health care provider.  Lie down in a dark, quiet room when you have a headache.  If directed, put ice on your head and neck area: ? Put ice in a plastic bag. ? Place a towel between your skin and the bag. ? Leave the ice on for 20 minutes, 2-3 times per day.  If directed, apply heat to the affected area. Use the heat source that your health care provider recommends, such as a moist heat pack or a heating pad. ? Place a towel between your skin and the heat source. ? Leave the heat on for 20-30 minutes. ? Remove the heat if your skin turns bright red. This is especially important if you are unable to feel pain, heat, or cold. You may have a greater risk of getting burned.  Keep lights dim if bright lights bother you or make your headaches worse. Eating and drinking  Eat meals on a regular schedule.  If you drink alcohol: ? Limit how much you use to:  0-1 drink a day for women.  0-2 drinks a day for men. ? Be aware of how much alcohol is in your drink. In the [place] one drink equals one 12 oz bottle of beer (355 mL), one 5 oz glass of wine (148 mL), or one 1 oz glass of hard liquor (44 mL).  Stop drinking caffeine, or decrease the amount of caffeine you drink. General instructions   Keep a headache journal to help find out what may trigger your headaches. For example, write down: ? What you eat and drink. ? How much  sleep you get. ? Any change to your diet or medicines.  Try massage or other relaxation techniques.  Limit stress.  Sit up straight, and do not tense your muscles.  Do not use any products that contain nicotine or tobacco, such as cigarettes, e-cigarettes, and chewing tobacco. If you need help quitting, ask your health care provider.  Exercise regularly as told by your health care provider.  Sleep on a regular schedule. Get 7-9 hours of sleep each night, or the amount recommended by your health care provider.  Keep all follow-up visits as told by your health care provider. This is important. Contact a health care provider if:  Your symptoms are not helped by medicine.  You have a headache that is different from the usual headache.  You have nausea or you vomit.  You have a fever. Get help right away if:  Your headache becomes severe quickly.  Your headache gets worse after moderate to intense physical activity.  You have repeated vomiting.  You have a stiff neck.  You have a loss of vision.  You have problems with speech.  You have pain in the eye or ear.  You have muscular weakness or loss of muscle control.  You lose your balance or have trouble walking.  You feel faint or pass out.  You have confusion.    You have a seizure. Summary  A headache is pain or discomfort felt around the head or neck area.  There are many causes and types of headaches. In some cases, the cause may not be found.  Keep a headache journal to help find out what may trigger your headaches. Watch your condition for any changes. Let your health care provider know about them.  Contact a health care provider if you have a headache that is different from the usual headache, or if your symptoms are not helped by medicine.  Get help right away if your headache becomes severe, you vomit, you have a loss of vision, you lose your balance, or you have a seizure. This information is not  intended to replace advice given to you by your health care provider. Make sure you discuss any questions you have with your health care provider. Document Revised: 02/16/2018 Document Reviewed: 02/16/2018 Elsevier Patient Education  2020 [employer]  

## 2020-01-04 ENCOUNTER — Other Ambulatory Visit: Payer: Self-pay

## 2020-01-04 ENCOUNTER — Ambulatory Visit (INDEPENDENT_AMBULATORY_CARE_PROVIDER_SITE_OTHER): Payer: 59 | Admitting: Student

## 2020-01-04 ENCOUNTER — Encounter: Payer: Self-pay | Admitting: Student

## 2020-01-04 VITALS — BP 113/64 | HR 86 | Wt 145.2 lb

## 2020-01-04 DIAGNOSIS — O219 Vomiting of pregnancy, unspecified: Secondary | ICD-10-CM

## 2020-01-04 DIAGNOSIS — O0993 Supervision of high risk pregnancy, unspecified, third trimester: Secondary | ICD-10-CM

## 2020-01-04 DIAGNOSIS — Z3A30 30 weeks gestation of pregnancy: Secondary | ICD-10-CM

## 2020-01-04 DIAGNOSIS — O0990 Supervision of high risk pregnancy, unspecified, unspecified trimester: Secondary | ICD-10-CM

## 2020-01-04 NOTE — Progress Notes (Signed)
     PRENATAL VISIT NOTE  Subjective:  Jody Robinson is a 29 y.o. G3P1011 at [redacted]w[redacted]d being seen today for ongoing prenatal care.  She is currently monitored for the following issues for this high-risk pregnancy and has History of IUFD; Abnormal LFTs; Supervision of high risk pregnancy, antepartum; Depression; Nausea/vomiting in pregnancy; Hyperemesis gravidarum; Seasonal allergies; Herpes simplex; History of abnormal cervical Pap smear; Thrombocytosis ([place]); Pancreatitis, acute; Gallbladder sludge; COVID-19; Low TSH level; and Abdominal cramping affecting pregnancy on their problem list.  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 She works at a machine all day long, she would like a work note to enable her to take leave early. She says that it is not FMLA that she wants to take, but that her job will give her time off without pay as long as she brings in a note. . Work is not able to accomodate her request for breaks, sitting. She says they will let her be on leave without pay for 8 weeks instead.   Contractions: Irritability. Vag. Bleeding: None.  Movement: Present. Denies leaking of fluid.   The following portions of the patient's history were reviewed and updated as appropriate: allergies, current medications, past family history, past medical history, past social history, past surgical history and problem list.   Objective:   Vitals:   01/04/20 1558  BP: 113/64  Pulse: 86  Weight: 145 lb 3.2 oz (65.9 kg)    Fetal Status: Fetal Heart Rate (bpm): 143 Fundal Height: 30 cm Movement: Present     General:  Alert, oriented and cooperative. Patient is in no acute distress.  Skin: Skin is warm and dry. No rash noted.   Cardiovascular: Normal heart rate noted  Respiratory: Normal respiratory effort, no problems with respiration noted  Abdomen: Soft, gravid, appropriate for gestational age.  Pain/Pressure: Present     Pelvic: Cervical exam  deferred        Extremities: Normal range of motion.  Edema: None  Mental Status: Normal mood and affect. Normal behavior. Normal judgment and thought content.   Assessment and Plan:  Pregnancy: G3P1011 at [redacted]w[redacted]d 1. Supervision of high risk pregnancy, antepartum -Work note given with patients' complaints to apply for leave without pay.  -keep follow up US scheduled for growth -needs lab work drawn at 01/19/2020 visit; continue taking iron  2. Nausea/vomiting in pregnancy -Continue to take medicine as prescribed.   Preterm labor symptoms and general obstetric precautions including but not limited to vaginal bleeding, contractions, leaking of fluid and fetal movement were reviewed in detail with the patient. Please refer to After Visit Summary for other counseling recommendations.   Return in about 2 weeks (around 01/18/2020), or LROB in person bc needs labs.  Future Appointments  Date Time Provider [place]  01/12/2020  3:30 PM [facility] US[facility]  01/19/2020  2:15 PM Lajean Manes, CNM [facility]    [place], [place]

## 2020-01-04 NOTE — Patient Instructions (Signed)
AREA PEDIATRIC/FAMILY PRACTICE PHYSICIANS  [place] ([address]) . [facility] o Chambliss, MD; Eniola, MD; Hale, MD; Hensel, MD; McDiarmid, MD; McIntyer, MD; Neal, MD; Walden, MD o [address] [place], [place] [address] o [phone] o Mon-Fri 8:30-12:30, 1:30-5:00 o Providers come to see babies at [facility] o Accepting Medicaid . [facility] o Limited providers who accept newborns: Koirala, MD; Morrow, MD; Wolters, MD o [address], [place], [place] [address] o [phone] o Mon-Fri 8:00-5:30 o Babies seen by providers at [facility] o Does NOT accept Medicaid o Please call early in hospitalization for appointment (limited availability)  . [facility] o Mulberry, MD o [address] [place], [place] [address] o [phone] o Mon, Tue, Thur, Fri 8:30-5:00, Wed 10:00-7:00 (closed 1-2pm) o Babies seen by [facility] providers o Accepting Medicaid . [facility] o Rubin, MD o [address], [place], [place] [address] o [phone] o Mon-Fri 8:30-5:00, Sat 8:30-12:00 o Provider comes to see babies at [facility] o Accepting Medicaid o Must have been referred from current patients or contacted office prior to delivery . [facility] ([facility]) o Brown, MD; Chandler, MD; Ettefagh, MD; Grant, MD; Lester, MD; McCormick, MD; McQueen, MD; Prose, MD; Simha, MD; Stanley, MD; Stryffeler, NP; Tebben, NP o [address], [place], [place] [address] o [phone] o Mon, Tue, Thur, Fri 8:30-5:30, Wed 9:30-5:30, Sat 8:30-12:30 o Babies seen by [facility] providers o Accepting Medicaid o Only accepting infants of first-time parents or siblings of current patients o Hospital discharge coordinator will make follow-up appointment . Jack Amos o [address],  [place], [place]  [address] o [phone]   Fax - [phone] . [facility] o [address], [place], [place]  [address] o Phone - [phone]   Fax - [phone] . Shilpa Gosrani o [address], [place], [place]  [address] o [phone]  [place] ([address]) . [facility] o Bates, MD; [place]assfield, MD; Cooper, Cox, MD; MD; Davis, MD; Dovico, MD; Ettefaugh, MD; Little, MD; Lowe, MD; Keiffer, MD; Melvin, MD; Sumner, MD; Williams, MD o [address], [place], [place] [address] o [phone] o Mon-Fri 8:30-5:00 (extended evenings Mon-Thur as needed), Sat-Sun 10:00-1:00 o Providers come to see babies at [facility] o Accepting Medicaid for families of first-time babies and families with all children in the household age 3 and under. Must register with office prior to making appointment (M-F only). . [facility] o Henson, NP; Knapp, MD; Lalonde, MD; Tysinger, PA o [address] [place], [place] [address] o [phone] o Mon-Fri 8:00-5:00 o Babies seen by providers at [facility] o Does NOT accept Medicaid/Commercial Insurance Only . [facility] ([facility]  o Artis, MD; Barnes, MD; Bratton, MD; Coccaro, MD; Lockett Gardner, MD; Kramer, MD; Marshall, MD; Netherton, MD; Poleto, MD; Skinner, MD o [address] [place], [place] [address] o [phone] o Mon-Fri 8:30-5:30, Sat (Oct.-Mar.) 9:00-1:00 o Babies seen by providers at [facility] o Accepting Medicaid  [place] ([address]) . [facility] o Reid, MD; Warner, MD o [address], [place], [place] [address] o [phone] o Mon-Fri 8:30-5:00, Sat 8:30-12:00 o Providers come to see babies at [facility] o Does NOT accept Medicaid . [facility] o Becker, PA; Hagler, MD; Scifres, PA; Sun, MD; Swayne, MD o [address],  [place], [place] [address] o [phone] o Mon-Fri 8:00-5:00 o Babies seen by providers at [facility] o Does NOT accept Medicaid o Only accepting babies of parents who are patients o Please call early in hospitalization for appointment (limited availability) . [facility] o Clark, MD; Frye, MD; Kelleher, MD; Mack, NP; Miller, MD; O'Keller, MD; Patterson, NP; Pudlo, MD; Puzio, MD; Thomas, MD; Tucker, MD; Twiselton, MD o [address], [place], [place] [address] o [phone] o Mon-Fri 8:00-5:00, Sat 9:00-12:00 o Providers come to see babies at [facility] o Does NOT accept Medicaid  [place] ([address]) . [facility] o Limited providers accepting new patients: Brake, NP; Wharton, PA o [address], [place], [place] [address] o [phone] o Mon-Fri 8:00-5:00 o Babies seen by providers at [facility] o Does NOT accept Medicaid o Only accepting babies of parents who are patients o Please call early in hospitalization for appointment (limited availability) . [facility] o Gay, MD; Quinlan, MD o [address] [place], [place] [address] o [phone] (press 1 to schedule appointment) o Mon-Fri 8:00-5:00 o Providers come to see babies at [facility] o Does NOT accept Medicaid . [facility] o Mazer, MD o [address] [place], [place] [address] o [phone] o Mon-Fri 8:30-5:00 (lunch 12:30-1:00), extended hours by appointment only Wed 5:00-6:30 o Babies seen by [facility] providers o Accepting Medicaid . [facility] o Banks, MD; Jordan, MD; Koberlein, MD o [address], [place], [place] [address] o [phone] o Mon-Fri 8:00-5:00 o Babies seen by [facility] providers o Does NOT accept Medicaid . [facility] o Parker, MD; Hunter, MD; Wallace, DO o [address] [place], [place]  [address] o [phone] o Mon-Fri 8:00-5:00 o Babies seen by [facility] providers o Does NOT accept Medicaid . [facility] o Brandon, PA; Brecken, PA; Christy, NP; Dees, MD; DeClaire, MD; DeWeese, MD; Hansen, NP; Mills, NP; Parrish, NP; Smoot, NP; Summer, MD; Vapne, MD o [address] [place], [place] [address] o [phone] o Mon-Fri 8:30-5:00, Sat 10:00-1:00 o Providers come to see babies at [facility] o Does NOT accept Medicaid o Free prenatal information session Tuesdays at 4:45pm . [facility] o Bouska, MD; Gordon, PA; Jeffery, PA; [place], PA o [address] [place] [address] o [phone] o Mon-Fri 7:30-5:30 o Babies seen by [facility] providers . [facility] o [address], [place], [place]  [address] o [phone]   Fax - [phone]  [place] ([address] & [address]) . [facility] o Reese, MD o [address] [place], [place] [address] o [phone] o Mon-Thur 8:00-6:00 o Providers come to see babies at [facility] o Accepting Medicaid . [facility] o Anderson, NP; Badger, MD; Beal, PA; Spencer, PA o [address] [place], [place] [address] o [phone] o Mon-Thur 7:30-7:30, Fri 7:30-4:30 o Babies seen by [facility] providers o Accepting Medicaid . [facility] o Agbuya, MD; Klett, NP; Romgoolam, MD o [address], [place], [place] [address] o [phone] o Mon-Fri 8:30-5:00, Sat 8:30-12:00 o Providers come to see babies at [facility] o Accepting Medicaid o Must have "Meet & Greet" appointment at office prior to delivery . [facility] o McCord,   MD; Wallace, MD; Wood, MD o [address], [place], [place] [address] o [phone] o Mon-Wed 8:00-6:00, Thur-Fri 8:00-5:00, Sat 9:00-12:00 o Providers come to  see babies at [facility] o Does NOT accept Medicaid o Only accepting siblings of current patients . [facility]  o [address], [place], [place]  [address] o [phone]   Fax - [phone] . [facility] o [address], [place], [place]  [address] o [phone]   Fax - [phone]  [place] ([address] & [address]) . [facility] o Cirigliano, DO; Matthews, DO o [address] [place], [place] [address] o [phone] o Mon-Fri 7:00-5:00 o Babies seen by [facility] providers o Does NOT accept Medicaid . [facility] o Briscoe, MD; Howley, PA; Moreira, PA o [address], [place], [place] [address] o [phone] o Mon-Fri 8:00-5:00 o Babies seen by [facility] providers o Accepting Medicaid . [facility] o Boyd, MD; Church, PA; Jones, NP; Osborn, PA o [address], [place], [place] [address] o [phone] o Mon-Fri 8:00-5:00 o Babies seen by providers at [facility] o Accepting Medicaid  [place] ([address]) . [facility] [place] o Wendling, DO o [address] [place], [place] [address] o [phone] o Mon-Fri 8:00-5:00 o Babies seen by [facility] providers o Does NOT accept Medicaid o Limited availability, please call early in hospitalization to schedule follow-up . [facility] o Calderon, PA; Cummings, MD; Dillard, MD; Martin, PA; Olson, MD; VanDeven, PA o [address], [place], [place] [address] o [phone] o Mon-Fri 8:30-5:00, Sat 9:00-12:00 o Babies seen by providers at [facility] o Accepting Medicaid o Please register online then schedule online or call office o [email or web address] . [facility] ([facility] o Hunter, NP; Kumar, MD; Martin Rogers, PA o [address], [place], [place] [address] o [phone] o Mon-Fri 8:00-5:00 o Babies seen by providers at [facility] o Accepting Medicaid . [facility] ([facility] o Hillcrest, MD; Kristi Fleenor, NP; West, MD o [address], [place], [place] [address] o [phone] o Mon-Fri 8:00-5:30, Sat&Sun by appointment (phones open at 8:30) o Babies seen by [facility] providers o Accepting Medicaid o Must be a first-time baby or sibling of current patient . [facility] [place]  o [address], [place], [place]  [address] o [phone]   Fax - [phone]  [place] ([address]) . [facility] o Brown, PA; Cowen, PA; Rice, MD; Helton, PA; Spry, MD o [address] [place], [place] [address] o [phone] o Mon-Thur 8:00-7:00, Fri 8:00-5:00, Sat 8:00-12:00, Sun 9:00-12:00 o Babies seen by [facility] providers o Accepting Medicaid . [facility] o Coe-Goins, MD; Marshall, MD; Pierre-Louis, MD o [address] [place] [address], [place],  [address] o [phone] o Mon-Thur 8:00-5:00 o Babies seen by providers at [facility] o Accepting Medicaid . [facility] o Bratton, MD; Coe-Goins, MD; Hayes, MD; Lewis, MD; List, MD; Lott, MD; Marshall, MD; Moran, MD; O'Neal, MD; Pierre-Louis, MD; Pitonzo, MD; Scholer, MD; Spangle, MD o [address] [place],    [address] o [phone] o Mon-Fri 8:00-5:30, Sat (Oct.-Mar.) 9:00-1:00 o Babies seen by providers at [facility] o Accepting Medicaid o Must fill out new patient packet, available online at [email or web address] . [facility] ([facility] o Friddle, NP; Harris, NP; Kelly, NP; Logan, MD;  Melvin, PA; Poth, MD; Ramadoss, MD; Stanton, NP o [address], [place], [place] [address] o [phone] o Mon-Thur 8:00-5:30, Fri 8:00-5:00 o Babies seen by providers at [facility] o Accepting Medicaid  [place] ([address]) . [facility] o Dixon, PA; Wells, MD; Pickard, MD; Tapia, PA o [address], [place], [place] [address] o [phone] o Mon-Fri 8:00-5:00 o Babies seen by providers at [facility] o Accepting Medicaid   [place] ([address]) . [facility] [place] o Masneri, DO; Meyers, MD; Nelson, PA o [address], [place], [place] [address] o [phone] o Mon-Fri 8:00-5:00 o Babies seen by providers at [facility] o Does NOT accept Medicaid o Limited appointment availability, please call early in hospitalization  . [facility] [place] o Kunedd, DO; McGowen, MD o [address], [place], [place] [address] o [phone] o Mon-Fri 8:00-5:00 o Babies seen by [facility] providers o Does NOT accept Medicaid . [facility] [place] o Cameron, MD; MacDonald, MD; Michaels, PA; Nayak, MD o [address] [place] [address], [place], [place] [address] o [phone] o Mon-Fri 8:00-5:00 o After hours clinic ([address] [place], [place] [address]) [phone] Mon-Fri 5:00-8:00, Sat 12:00-6:00, Sun 10:00-4:00 o Babies seen by [facility] providers o Accepting Medicaid . [facility] [place] o [address], [place], [place]  [address] o [phone]   Fax - [phone]  [place] ([address]) . [facility] [place] [facility] o Andy, MD o [address], [place], [place] [address] o [phone] o Mon-Fri 8:00-5:00 o Babies seen by [facility] providers o Does NOT accept Medicaid . [facility] [place] ([facility] [place]) o Eksir, MD o [address], [place], [place]  [address] o [phone] o Mon-Thur 8:00-7:00, Fri 8:00-5:00, Sat 8:00-12:00 o Babies seen by providers at [facility] o Accepting Medicaid - but does not have vaccinations in office (must be received elsewhere) o Limited availability, please call early in hospitalization  [place] ([address]) . [facility]  o Charlene Flemming, MD o [address],  [place] [address] o [phone]  Fax [phone]   

## 2020-01-12 ENCOUNTER — Ambulatory Visit (HOSPITAL_COMMUNITY): Payer: 59 | Attending: Obstetrics and Gynecology

## 2020-01-12 ENCOUNTER — Other Ambulatory Visit: Payer: Self-pay

## 2020-01-12 DIAGNOSIS — O358XX0 Maternal care for other (suspected) fetal abnormality and damage, not applicable or unspecified: Secondary | ICD-10-CM | POA: Diagnosis not present

## 2020-01-12 DIAGNOSIS — Z3A32 32 weeks gestation of pregnancy: Secondary | ICD-10-CM

## 2020-01-12 DIAGNOSIS — Z362 Encounter for other antenatal screening follow-up: Secondary | ICD-10-CM | POA: Diagnosis not present

## 2020-01-12 DIAGNOSIS — O09293 Supervision of pregnancy with other poor reproductive or obstetric history, third trimester: Secondary | ICD-10-CM | POA: Diagnosis not present

## 2020-01-12 DIAGNOSIS — O2693 Pregnancy related conditions, unspecified, third trimester: Secondary | ICD-10-CM | POA: Diagnosis not present

## 2020-01-19 ENCOUNTER — Ambulatory Visit (INDEPENDENT_AMBULATORY_CARE_PROVIDER_SITE_OTHER): Payer: 59 | Admitting: Certified Nurse Midwife

## 2020-01-19 ENCOUNTER — Other Ambulatory Visit: Payer: Self-pay

## 2020-01-19 ENCOUNTER — Encounter: Payer: Self-pay | Admitting: Certified Nurse Midwife

## 2020-01-19 VITALS — BP 112/66 | HR 78 | Wt 147.7 lb

## 2020-01-19 DIAGNOSIS — O0993 Supervision of high risk pregnancy, unspecified, third trimester: Secondary | ICD-10-CM

## 2020-01-19 DIAGNOSIS — B009 Herpesviral infection, unspecified: Secondary | ICD-10-CM

## 2020-01-19 DIAGNOSIS — O0990 Supervision of high risk pregnancy, unspecified, unspecified trimester: Secondary | ICD-10-CM

## 2020-01-19 DIAGNOSIS — D473 Essential (hemorrhagic) thrombocythemia: Secondary | ICD-10-CM

## 2020-01-19 DIAGNOSIS — Z3A33 33 weeks gestation of pregnancy: Secondary | ICD-10-CM

## 2020-01-19 DIAGNOSIS — O99113 Other diseases of the blood and blood-forming organs and certain disorders involving the immune mechanism complicating pregnancy, third trimester: Secondary | ICD-10-CM

## 2020-01-19 DIAGNOSIS — D75839 Thrombocytosis, unspecified: Secondary | ICD-10-CM

## 2020-01-19 DIAGNOSIS — O98313 Other infections with a predominantly sexual mode of transmission complicating pregnancy, third trimester: Secondary | ICD-10-CM

## 2020-01-19 LAB — CBC
Hematocrit: 26.9 % — ABNORMAL LOW (ref 34.0–46.6)
Hemoglobin: 8.3 g/dL — ABNORMAL LOW (ref 11.1–15.9)
MCH: 24 pg — ABNORMAL LOW (ref 26.6–33.0)
MCHC: 30.9 g/dL — ABNORMAL LOW (ref 31.5–35.7)
MCV: 78 fL — ABNORMAL LOW (ref 79–97)
Platelets: 472 10*3/uL — ABNORMAL HIGH (ref 150–450)
RBC: 3.46 x10E6/uL — ABNORMAL LOW (ref 3.77–5.28)
RDW: 15.2 % (ref 11.7–15.4)
WBC: 13.8 10*3/uL — ABNORMAL HIGH (ref 3.4–10.8)

## 2020-01-19 MED ORDER — VALACYCLOVIR HCL 500 MG PO TABS: 500.0000 mg | ORAL_TABLET | Freq: Two times a day (BID) | ORAL | 6 refills | Status: DC

## 2020-01-19 NOTE — Patient Instructions (Signed)

## 2020-01-19 NOTE — Progress Notes (Signed)
   PRENATAL VISIT NOTE  Subjective:  Jody Robinson is a 29 y.o. G3P1011 at [redacted]w[redacted]d being seen today for ongoing prenatal care.  She is currently monitored for the following issues for this high-risk pregnancy and has History of IUFD; Abnormal LFTs; Supervision of high risk pregnancy, antepartum; Depression; Nausea/vomiting in pregnancy; Hyperemesis gravidarum; Seasonal allergies; Herpes simplex; History of abnormal cervical Pap smear; Thrombocytosis ([place]); Pancreatitis, acute; Gallbladder sludge; COVID-19; Low TSH level; and Abdominal cramping affecting pregnancy on their problem list.  Patient reports no complaints.  Contractions: Irritability. Vag. Bleeding: None.  Movement: Present. Denies leaking of fluid.   The following portions of the patient's history were reviewed and updated as appropriate: allergies, current medications, past family history, past medical history, past social history, past surgical history and problem list.   Objective:   Vitals:   01/19/20 1421  BP: 112/66  Pulse: 78  Weight: 147 lb 11.2 oz (67 kg)    Fetal Status: Fetal Heart Rate (bpm): 140 Fundal Height: 33 cm Movement: Present     General:  Alert, oriented and cooperative. Patient is in no acute distress.  Skin: Skin is warm and dry. No rash noted.   Cardiovascular: Normal heart rate noted  Respiratory: Normal respiratory effort, no problems with respiration noted  Abdomen: Soft, gravid, appropriate for gestational age.  Pain/Pressure: Present     Pelvic: Cervical exam deferred        Extremities: Normal range of motion.  Edema: None  Mental Status: Normal mood and affect. Normal behavior. Normal judgment and thought content.   Assessment and Plan:  Pregnancy: G3P1011 at [redacted]w[redacted]d 1. Supervision of high risk pregnancy, antepartum - Patient doing well, no complaints  - routine prenatal care - anticipatory guidance on upcoming appointments with next being in person for GBS  - Educated and discussed GBS  testing during pregnancy and recommendations of antibiotics during labor if positive, patient verbalizes understanding   2. Herpes simplex - Patient reports last delivery was around 37 weeks  - Discussed need for suppression, recommended patient start on suppression now d/t last delivery around 37 weeks, patient verbalizes understanding  - Patient does not want anyone continuing to as about HSV since it is in the chart  - valACYclovir (VALTREX) 500 MG tablet; Take 1 tablet (500 mg total) by mouth 2 (two) times daily.  Dispense: 60 tablet; Refill: 6  3. Thrombocytosis ([facility]) - hgb 8.5 on 5/11, repeat CBC obtained today  - continue iron supplementation  - CBC  Preterm labor symptoms and general obstetric precautions including but not limited to vaginal bleeding, contractions, leaking of fluid and fetal movement were reviewed in detail with the patient. Please refer to After Visit Summary for other counseling recommendations.   Return in about 3 weeks (around 02/09/2020) for ROB/GBS.  Future Appointments  Date Time Provider [place]  02/10/2020  3:55 PM Danielle Rankin [facility]    Lajean Manes, [place]

## 2020-01-20 ENCOUNTER — Other Ambulatory Visit: Payer: Self-pay | Admitting: Certified Nurse Midwife

## 2020-01-20 ENCOUNTER — Telehealth: Payer: Self-pay

## 2020-01-20 NOTE — Telephone Encounter (Addendum)
Per Darrol Poke, CNM pt needs a Fereheme infusion.  Orders has been placed.    Fereheme infusion scheduled for 01/28/20 @ 1300.  Pt notified of appt and that the location information is in [place].  Pt verbalized understanding.  Mel Almond, RN

## 2020-01-24 ENCOUNTER — Encounter (HOSPITAL_COMMUNITY): Payer: Self-pay | Admitting: Obstetrics & Gynecology

## 2020-01-24 ENCOUNTER — Inpatient Hospital Stay (HOSPITAL_COMMUNITY)
Admission: AD | Admit: 2020-01-24 | Discharge: 2020-01-24 | Disposition: A | Discharge status: Home / Self Care | Payer: 59 | Attending: Obstetrics & Gynecology | Admitting: Obstetrics & Gynecology

## 2020-01-24 ENCOUNTER — Other Ambulatory Visit: Payer: Self-pay

## 2020-01-24 DIAGNOSIS — Z79899 Other long term (current) drug therapy: Secondary | ICD-10-CM | POA: Diagnosis not present

## 2020-01-24 DIAGNOSIS — O99113 Other diseases of the blood and blood-forming organs and certain disorders involving the immune mechanism complicating pregnancy, third trimester: Secondary | ICD-10-CM | POA: Diagnosis not present

## 2020-01-24 DIAGNOSIS — R102 Pelvic and perineal pain: Secondary | ICD-10-CM | POA: Diagnosis not present

## 2020-01-24 DIAGNOSIS — O212 Late vomiting of pregnancy: Secondary | ICD-10-CM | POA: Diagnosis not present

## 2020-01-24 DIAGNOSIS — O26893 Other specified pregnancy related conditions, third trimester: Secondary | ICD-10-CM | POA: Insufficient documentation

## 2020-01-24 DIAGNOSIS — O219 Vomiting of pregnancy, unspecified: Secondary | ICD-10-CM | POA: Diagnosis not present

## 2020-01-24 DIAGNOSIS — O0990 Supervision of high risk pregnancy, unspecified, unspecified trimester: Secondary | ICD-10-CM

## 2020-01-24 DIAGNOSIS — Z3A33 33 weeks gestation of pregnancy: Secondary | ICD-10-CM | POA: Diagnosis not present

## 2020-01-24 DIAGNOSIS — Z3689 Encounter for other specified antenatal screening: Secondary | ICD-10-CM

## 2020-01-24 DIAGNOSIS — D473 Essential (hemorrhagic) thrombocythemia: Secondary | ICD-10-CM | POA: Insufficient documentation

## 2020-01-24 DIAGNOSIS — D75839 Thrombocytosis, unspecified: Secondary | ICD-10-CM

## 2020-01-24 DIAGNOSIS — R109 Unspecified abdominal pain: Secondary | ICD-10-CM

## 2020-01-24 LAB — URINALYSIS, ROUTINE W REFLEX MICROSCOPIC
Bacteria, UA: NONE SEEN
Bilirubin Urine: NEGATIVE
Glucose, UA: NEGATIVE mg/dL
Hgb urine dipstick: NEGATIVE
Ketones, ur: NEGATIVE mg/dL
Leukocytes,Ua: NEGATIVE
Nitrite: POSITIVE — AB
Protein, ur: NEGATIVE mg/dL
Specific Gravity, Urine: 1.009 (ref 1.005–1.030)
pH: 7 (ref 5.0–8.0)

## 2020-01-24 LAB — WET PREP, GENITAL
Clue Cells Wet Prep HPF POC: NONE SEEN
Sperm: NONE SEEN
Trich, Wet Prep: NONE SEEN
Yeast Wet Prep HPF POC: NONE SEEN

## 2020-01-24 LAB — FETAL FIBRONECTIN: Fetal Fibronectin: NEGATIVE

## 2020-01-24 NOTE — MAU Note (Signed)
Patient states she is having pelvic pressure that began 01/23/20 in the evening. Pressure is constant and no contractions or LOF at this time.

## 2020-01-24 NOTE — MAU Provider Note (Signed)
History     CSN: [id number]  Arrival date and time: 01/24/20 1327   First Provider Initiated Contact with Patient 01/24/20 1435      Chief Complaint  Patient presents with   Pelvic Pain   [name] is a 29 y.o. G3P1011 at [redacted]w[redacted]d who presents to MAU for PTL evaluation after pelvic pressure began 01/19/2020, which worsened last night. Patient describes pelvic pressure as "a lot, a lot of pressure" with pressure with standing and pressure with urination. Patient reports pressure is better when laying on side, but still present. Patient does have a pregnancy support belt at home, which she wears and reports it helps sometimes. Patient denies intercourse in the past 24 hours.  Pt denies ctx, change in vaginal discharge amount/color/consistency, VB, new onset backache, intermittent abdominal discomfort/pain, pelvic pressure/pain, cramping. Pt denies chest pain and SOB.  Pt denies constipation, diarrhea, or urinary problems. Pt denies fever, chills, fatigue, sweating or changes in appetite. Pt denies dizziness, light-headedness, weakness.  Pt denies VB, LOF and reports good FM.  Current pregnancy problems? Hx IUFD @15  weeks Blood Type? O Positive Allergies? NKDA Current medications? Zofran, Phenergan, Reglan, Valtrex Current [facility] & next appt? [facility], 02/10/2020   OB History    Gravida  3   Para  1   Term  1   Preterm      AB  1   Living  1     SAB  1   TAB      Ectopic      Multiple      Live Births  1           Past Medical History:  Diagnosis Date   Abnormal Pap smear of cervix    Depression     Past Surgical History:  Procedure Laterality Date   COLPOSCOPY      Family History  Problem Relation Age of Onset   Diabetes Mother     Social History   Tobacco Use   Smoking status: Never Smoker   Smokeless tobacco: Never Used  Vaping Use   Vaping Use: Never used  Substance Use Topics   Alcohol use: Not Currently    Comment:  occasional   Drug use: No    Allergies: No Known Allergies  Medications Prior to Admission  Medication Sig Dispense Refill Last Dose   ferrous sulfate 325 (65 FE) MG tablet Take 1 tablet (325 mg total) by mouth every other day. 30 tablet 1 Past Week at Unknown time   metoCLOPramide (REGLAN) 10 MG tablet Take 1 tablet (10 mg total) by mouth 3 (three) times daily with meals. 90 tablet 1 01/24/2020 at Unknown time   ondansetron (ZOFRAN) 4 MG tablet Take 1 tablet (4 mg total) by mouth daily as needed for nausea or vomiting. 30 tablet 1 01/24/2020 at Unknown time   Prenatal Vit-Fe Fumarate-FA (PRENATAL VITAMINS PO) Take by mouth.   01/24/2020 at Unknown time   valACYclovir (VALTREX) 500 MG tablet Take 1 tablet (500 mg total) by mouth 2 (two) times daily. 60 tablet 6 01/24/2020 at Unknown time   cyclobenzaprine (FLEXERIL) 5 MG tablet Take 1 tablet (5 mg total) by mouth 2 (two) times daily as needed for muscle spasms. 10 tablet 0    Elastic Bandages & Supports (COMFORT FIT MATERNITY SUPP SM) MISC 1 Units by Does not apply route daily as needed. (Patient not taking: Reported on 11/03/2019) 1 each 0     Review of Systems  Constitutional: Negative  for chills, diaphoresis, fatigue and fever.  Eyes: Negative for visual disturbance.  Respiratory: Negative for shortness of breath.   Cardiovascular: Negative for chest pain.  Gastrointestinal: Negative for abdominal pain, constipation, diarrhea, nausea and vomiting.  Genitourinary: Positive for pelvic pain (pt denies pain, reports pressure only). Negative for dysuria, flank pain, frequency, urgency, vaginal bleeding and vaginal discharge.  Neurological: Negative for dizziness, weakness, light-headedness and headaches.   Physical Exam   Blood pressure 104/65, pulse (!) 106, temperature 98.4 F (36.9 C), temperature source Oral, resp. rate 18, height 5\' 1"  (1.549 m), weight 66.6 kg, last menstrual period 06/02/2019, SpO2 100 %.  Patient Vitals for  the past 24 hrs:  BP Temp Temp src Pulse Resp SpO2 Height Weight  01/24/20 1352 104/65 98.4 F (36.9 C) Oral (!) 106 18 100 % 5\' 1"  (1.549 m) --  01/24/20 1338 -- -- -- -- -- -- -- 66.6 kg   Physical Exam  Constitutional: She is oriented to person, place, and time. She appears well-developed. No distress.  HENT:  Head: Normocephalic and atraumatic.  Respiratory: Effort normal.  GI: Soft.  Genitourinary:    Vulva normal.  There is no rash, tenderness or lesion on the right labia. There is no rash, tenderness or lesion on the left labia.    No vaginal discharge.     Genitourinary Comments: Dilation: 1 Effacement (%): Thick Cervical Position: Posterior Station: Ballotable Presentation: Undeterminable   Neurological: She is alert and oriented to person, place, and time.  Skin: Skin is warm and dry. She is not diaphoretic.  Psychiatric: Her behavior is normal. Judgment and thought content normal.   Results for orders placed or performed during the hospital encounter of 01/24/20 (from the past 24 hour(s))  Urinalysis, Routine w reflex microscopic     Status: Abnormal   Collection Time: 01/24/20  2:26 PM  Result Value Ref Range   Color, Urine YELLOW YELLOW   APPearance CLEAR CLEAR   Specific Gravity, Urine 1.009 1.005 - 1.030   pH 7.0 5.0 - 8.0   Glucose, UA NEGATIVE NEGATIVE mg/dL   Hgb urine dipstick NEGATIVE NEGATIVE   Bilirubin Urine NEGATIVE NEGATIVE   Ketones, ur NEGATIVE NEGATIVE mg/dL   Protein, ur NEGATIVE NEGATIVE mg/dL   Nitrite POSITIVE (A) NEGATIVE   Leukocytes,Ua NEGATIVE NEGATIVE   WBC, UA 0-5 0 - 5 WBC/hpf   Bacteria, UA NONE SEEN NONE SEEN   Squamous Epithelial / LPF 0-5 0 - 5  Fetal fibronectin     Status: None   Collection Time: 01/24/20  2:56 PM  Result Value Ref Range   Fetal Fibronectin NEGATIVE NEGATIVE  Wet prep, genital     Status: Abnormal   Collection Time: 01/24/20  2:58 PM  Result Value Ref Range   Yeast Wet Prep HPF POC NONE SEEN NONE SEEN    Trich, Wet Prep NONE SEEN NONE SEEN   Clue Cells Wet Prep HPF POC NONE SEEN NONE SEEN   WBC, Wet Prep HPF POC MANY (A) NONE SEEN   Sperm NONE SEEN     MAU Course  Procedures  MDM -pelvic pressure, better with lying, r/o PTL -UA: +nitrite, all else WNL, sending urine for culture, will wait to treat based on lack or urinary symptoms -CE: Dilation: 1 Effacement (%): Thick Cervical Position: Posterior Station: Ballotable Presentation: Undeterminable Exam by:: Baylyn Sickles -fFN: negative -WetPrep: WNL -GC/CT collected -EFM: reactive       -baseline: 145/135       -variability: moderate       -  accels: present, 15x15       -decels: absent       -TOCO: quiet -pt discharged to home in stable condition  Orders Placed This Encounter  Procedures   Wet prep, genital    Standing Status:   Standing    Number of Occurrences:   1   Culture, OB Urine    Standing Status:   Standing    Number of Occurrences:   1   Urinalysis, Routine w reflex microscopic    Standing Status:   Standing    Number of Occurrences:   1   Fetal fibronectin    Standing Status:   Standing    Number of Occurrences:   1   Discharge patient    Order Specific Question:   Discharge disposition    Answer:   01-Home or Self Care [1]    Order Specific Question:   Discharge patient date    Answer:   01/24/2020    Assessment and Plan   1. Pelvic pressure in pregnancy   2. Abdominal cramping affecting pregnancy   3. Thrombocytosis ([place])   4. Nausea/vomiting in pregnancy   5. Supervision of high risk pregnancy, antepartum   6. [redacted] weeks gestation of pregnancy   7. NST (non-stress test) reactive     Allergies as of 01/24/2020   No Known Allergies     Medication List    TAKE these medications   Comfort Fit Maternity Supp Sm Misc 1 Units by Does not apply route daily as needed.   cyclobenzaprine 5 MG tablet Commonly known as: FLEXERIL Take 1 tablet (5 mg total) by mouth 2 (two) times daily as needed for  muscle spasms.   ferrous sulfate 325 (65 FE) MG tablet Take 1 tablet (325 mg total) by mouth every other day.   metoCLOPramide 10 MG tablet Commonly known as: REGLAN Take 1 tablet (10 mg total) by mouth 3 (three) times daily with meals.   ondansetron 4 MG tablet Commonly known as: Zofran Take 1 tablet (4 mg total) by mouth daily as needed for nausea or vomiting.   PRENATAL VITAMINS PO Take by mouth.   valACYclovir 500 MG tablet Commonly known as: VALTREX Take 1 tablet (500 mg total) by mouth 2 (two) times daily.      -will call with culture results, if positive -pt notified about iron fusions and advised to call clinic to schedule -continue with pregnancy support belt -PTL precautions given -return MAU precautions given -pt discharged to home in stable condition  Elmyra Ricks E Sindy Mccune 01/24/2020, 3:44 PM

## 2020-01-24 NOTE — Discharge Instructions (Signed)
Abdominal Pain During Pregnancy  Abdominal pain is common during pregnancy, and has many possible causes. Some causes are more serious than others, and sometimes the cause is not known. Abdominal pain can be a sign that labor is starting. It can also be caused by normal growth and stretching of muscles and ligaments during pregnancy. Always tell your health care provider if you have any abdominal pain. Follow these instructions at home:  Do not have sex or put anything in your vagina until your pain goes away completely.  Get plenty of rest until your pain improves.  Drink enough fluid to keep your urine pale yellow.  Take over-the-counter and prescription medicines only as told by your health care provider.  Keep all follow-up visits as told by your health care provider. This is important. Contact a health care provider if:  Your pain continues or gets worse after resting.  You have lower abdominal pain that: ? Comes and goes at regular intervals. ? Spreads to your back. ? Is similar to menstrual cramps.  You have pain or burning when you urinate. Get help right away if:  You have a fever or chills.  You have vaginal bleeding.  You are leaking fluid from your vagina.  You are passing tissue from your vagina.  You have vomiting or diarrhea that lasts for more than 24 hours.  Your baby is moving less than usual.  You feel very weak or faint.  You have shortness of breath.  You develop severe pain in your upper abdomen. Summary  Abdominal pain is common during pregnancy, and has many possible causes.  If you experience abdominal pain during pregnancy, tell your health care provider right away.  Follow your health care provider's home care instructions and keep all follow-up visits as directed. This information is not intended to replace advice given to you by your health care provider. Make sure you discuss any questions you have with your health care  provider. Document Revised: 11/16/2018 Document Reviewed: 10/31/2016 Elsevier Patient Education  [place].        Preterm Labor and Birth Information  The normal length of a pregnancy is 39-41 weeks. Preterm labor is when labor starts before 37 completed weeks of pregnancy. What are the risk factors for preterm labor? Preterm labor is more likely to occur in women who:  Have certain infections during pregnancy such as a bladder infection, sexually transmitted infection, or infection inside the uterus (chorioamnionitis).  Have a shorter-than-normal cervix.  Have gone into preterm labor before.  Have had surgery on their cervix.  Are younger than age 57 or older than age 48.  Are African American.  Are pregnant with twins or multiple babies (multiple gestation).  Take street drugs or smoke while pregnant.  Do not gain enough weight while pregnant.  Became pregnant shortly after having been pregnant. What are the symptoms of preterm labor? Symptoms of preterm labor include:  Cramps similar to those that can happen during a menstrual period. The cramps may happen with diarrhea.  Pain in the abdomen or lower back.  Regular uterine contractions that may feel like tightening of the abdomen.  A feeling of increased pressure in the pelvis.  Increased watery or bloody mucus discharge from the vagina.  Water breaking (ruptured amniotic sac). Why is it important to recognize signs of preterm labor? It is important to recognize signs of preterm labor because babies who are born prematurely may not be fully developed. This can put them at  an increased risk for:  Long-term (chronic) heart and lung problems.  Difficulty immediately after birth with regulating body systems, including blood sugar, body temperature, heart rate, and breathing rate.  Bleeding in the brain.  Cerebral palsy.  Learning difficulties.  Death. These risks are highest for babies who are  born before 73 weeks of pregnancy. How is preterm labor treated? Treatment depends on the length of your pregnancy, your condition, and the health of your baby. It may involve:  Having a stitch (suture) placed in your cervix to prevent your cervix from opening too early (cerclage).  Taking or being given medicines, such as: ? Hormone medicines. These may be given early in pregnancy to help support the pregnancy. ? Medicine to stop contractions. ? Medicines to help mature the babys lungs. These may be prescribed if the risk of delivery is high. ? Medicines to prevent your baby from developing cerebral palsy. If the labor happens before 34 weeks of pregnancy, you may need to stay in the hospital. What should I do if I think I am in preterm labor? If you think that you are going into preterm labor, call your health care provider right away. How can I prevent preterm labor in future pregnancies? To increase your chance of having a full-term pregnancy:  Do not use any tobacco products, such as cigarettes, chewing tobacco, and e-cigarettes. If you need help quitting, ask your health care provider.  Do not use street drugs or medicines that have not been prescribed to you during your pregnancy.  Talk with your health care provider before taking any herbal supplements, even if you have been taking them regularly.  Make sure you gain a healthy amount of weight during your pregnancy.  Watch for infection. If you think that you might have an infection, get it checked right away.  Make sure to tell your health care provider if you have gone into preterm labor before. This information is not intended to replace advice given to you by your health care provider. Make sure you discuss any questions you have with your health care provider. Document Revised: 11/20/2018 Document Reviewed: 12/20/2015 Elsevier Patient Education  [place].

## 2020-01-25 LAB — GC/CHLAMYDIA PROBE AMP (~~LOC~~) NOT AT ARMC
Chlamydia: NEGATIVE
Comment: NEGATIVE
Comment: NORMAL
Neisseria Gonorrhea: NEGATIVE

## 2020-01-25 LAB — CULTURE, OB URINE: Culture: NO GROWTH

## 2020-01-26 NOTE — Discharge Instructions (Signed)

## 2020-01-28 ENCOUNTER — Other Ambulatory Visit: Payer: Self-pay

## 2020-01-28 ENCOUNTER — Ambulatory Visit (HOSPITAL_COMMUNITY)
Admission: RE | Admit: 2020-01-28 | Discharge: 2020-01-28 | Disposition: A | Discharge status: Home / Self Care | Payer: 59 | Source: Ambulatory Visit | Attending: Certified Nurse Midwife | Admitting: Certified Nurse Midwife

## 2020-01-28 DIAGNOSIS — O99019 Anemia complicating pregnancy, unspecified trimester: Secondary | ICD-10-CM | POA: Insufficient documentation

## 2020-01-28 DIAGNOSIS — Z3A00 Weeks of gestation of pregnancy not specified: Secondary | ICD-10-CM | POA: Diagnosis not present

## 2020-01-28 DIAGNOSIS — D649 Anemia, unspecified: Secondary | ICD-10-CM | POA: Insufficient documentation

## 2020-01-28 MED ORDER — SODIUM CHLORIDE 0.9 % IV SOLN
510.0000 mg | INTRAVENOUS | Status: DC
  Administered 2020-01-28: 510 mg via INTRAVENOUS
  Filled 2020-01-28: qty 17

## 2020-02-04 ENCOUNTER — Ambulatory Visit (HOSPITAL_COMMUNITY)
Admission: RE | Admit: 2020-02-04 | Discharge: 2020-02-04 | Disposition: A | Discharge status: Home / Self Care | Payer: 59 | Source: Ambulatory Visit | Attending: Certified Nurse Midwife | Admitting: Certified Nurse Midwife

## 2020-02-04 ENCOUNTER — Other Ambulatory Visit: Payer: Self-pay

## 2020-02-04 DIAGNOSIS — O99019 Anemia complicating pregnancy, unspecified trimester: Secondary | ICD-10-CM | POA: Diagnosis present

## 2020-02-04 DIAGNOSIS — D509 Iron deficiency anemia, unspecified: Secondary | ICD-10-CM | POA: Insufficient documentation

## 2020-02-04 MED ORDER — SODIUM CHLORIDE 0.9 % IV SOLN
510.0000 mg | INTRAVENOUS | Status: DC
  Administered 2020-02-04: 510 mg via INTRAVENOUS
  Filled 2020-02-04: qty 17

## 2020-02-04 MED ORDER — ONDANSETRON HCL 4 MG/2ML IJ SOLN
INTRAMUSCULAR | Status: AC
  Filled 2020-02-04: qty 2

## 2020-02-04 MED ORDER — ONDANSETRON HCL 4 MG/2ML IJ SOLN
4.0000 mg | Freq: Once | INTRAMUSCULAR | Status: AC
  Administered 2020-02-04: 4 mg via INTRAVENOUS

## 2020-02-04 MED ORDER — SODIUM CHLORIDE 0.9 % IV SOLN: Freq: Once | INTRAVENOUS | Status: AC

## 2020-02-04 NOTE — Progress Notes (Addendum)
Spoke with emergency OB doctor Silas [place] about patient having nausea and vomiting after second dose of feraheme.  Vital signs are stable, baby Heart beat is stable.  IVF Ns begun and received order to give IV zofran 4 mg.   Once IVF completed (545ml) and zofran given patient now states she feels better and nausea is gone.  Fetal HR 150, patient stated she was okay to walk out- did not want wheelchair at discharge.

## 2020-02-06 ENCOUNTER — Inpatient Hospital Stay (HOSPITAL_COMMUNITY)
Admission: AD | Admit: 2020-02-06 | Discharge: 2020-02-07 | Disposition: A | Discharge status: Home / Self Care | Payer: 59 | Attending: Obstetrics and Gynecology | Admitting: Obstetrics and Gynecology

## 2020-02-06 ENCOUNTER — Encounter (HOSPITAL_COMMUNITY): Payer: Self-pay | Admitting: Obstetrics and Gynecology

## 2020-02-06 ENCOUNTER — Other Ambulatory Visit: Payer: Self-pay

## 2020-02-06 DIAGNOSIS — R42 Dizziness and giddiness: Secondary | ICD-10-CM | POA: Diagnosis not present

## 2020-02-06 DIAGNOSIS — O219 Vomiting of pregnancy, unspecified: Secondary | ICD-10-CM

## 2020-02-06 DIAGNOSIS — Z79899 Other long term (current) drug therapy: Secondary | ICD-10-CM | POA: Diagnosis not present

## 2020-02-06 DIAGNOSIS — O2343 Unspecified infection of urinary tract in pregnancy, third trimester: Secondary | ICD-10-CM | POA: Diagnosis not present

## 2020-02-06 DIAGNOSIS — O212 Late vomiting of pregnancy: Secondary | ICD-10-CM | POA: Diagnosis not present

## 2020-02-06 DIAGNOSIS — O26893 Other specified pregnancy related conditions, third trimester: Secondary | ICD-10-CM | POA: Insufficient documentation

## 2020-02-06 DIAGNOSIS — Z3A35 35 weeks gestation of pregnancy: Secondary | ICD-10-CM

## 2020-02-06 DIAGNOSIS — Z3689 Encounter for other specified antenatal screening: Secondary | ICD-10-CM

## 2020-02-06 DIAGNOSIS — R0602 Shortness of breath: Secondary | ICD-10-CM | POA: Insufficient documentation

## 2020-02-06 LAB — CBC WITH DIFFERENTIAL/PLATELET
Abs Immature Granulocytes: 0.69 10*3/uL — ABNORMAL HIGH (ref 0.00–0.07)
Basophils Absolute: 0.1 10*3/uL (ref 0.0–0.1)
Basophils Relative: 0 %
Eosinophils Absolute: 0 10*3/uL (ref 0.0–0.5)
Eosinophils Relative: 0 %
HCT: 32.7 % — ABNORMAL LOW (ref 36.0–46.0)
Hemoglobin: 9.7 g/dL — ABNORMAL LOW (ref 12.0–15.0)
Immature Granulocytes: 4 %
Lymphocytes Relative: 11 %
Lymphs Abs: 1.8 10*3/uL (ref 0.7–4.0)
MCH: 24.1 pg — ABNORMAL LOW (ref 26.0–34.0)
MCHC: 29.7 g/dL — ABNORMAL LOW (ref 30.0–36.0)
MCV: 81.3 fL (ref 80.0–100.0)
Monocytes Absolute: 0.9 10*3/uL (ref 0.1–1.0)
Monocytes Relative: 5 %
Neutro Abs: 13.8 10*3/uL — ABNORMAL HIGH (ref 1.7–7.7)
Neutrophils Relative %: 80 %
Platelets: 483 10*3/uL — ABNORMAL HIGH (ref 150–400)
RBC: 4.02 MIL/uL (ref 3.87–5.11)
RDW: 21.1 % — ABNORMAL HIGH (ref 11.5–15.5)
WBC: 17.3 10*3/uL — ABNORMAL HIGH (ref 4.0–10.5)
nRBC: 0.3 % — ABNORMAL HIGH (ref 0.0–0.2)

## 2020-02-06 LAB — URINALYSIS, ROUTINE W REFLEX MICROSCOPIC
Glucose, UA: NEGATIVE mg/dL
Hgb urine dipstick: NEGATIVE
Ketones, ur: 5 mg/dL — AB
Leukocytes,Ua: NEGATIVE
Nitrite: POSITIVE — AB
Protein, ur: >=300 mg/dL — AB
Specific Gravity, Urine: 1.027 (ref 1.005–1.030)
pH: 6 (ref 5.0–8.0)

## 2020-02-06 LAB — COMPREHENSIVE METABOLIC PANEL
ALT: 12 U/L (ref 0–44)
AST: 23 U/L (ref 15–41)
Albumin: 2.5 g/dL — ABNORMAL LOW (ref 3.5–5.0)
Alkaline Phosphatase: 194 U/L — ABNORMAL HIGH (ref 38–126)
Anion gap: 14 (ref 5–15)
BUN: 5 mg/dL — ABNORMAL LOW (ref 6–20)
CO2: 22 mmol/L (ref 22–32)
Calcium: 8.6 mg/dL — ABNORMAL LOW (ref 8.9–10.3)
Chloride: 102 mmol/L (ref 98–111)
Creatinine, Ser: 0.83 mg/dL (ref 0.44–1.00)
GFR calc Af Amer: >60 mL/min (ref >60)
GFR calc non Af Amer: >60 mL/min (ref >60)
Glucose, Bld: 95 mg/dL (ref 70–99)
Potassium: 3.8 mmol/L (ref 3.5–5.1)
Sodium: 138 mmol/L (ref 135–145)
Total Bilirubin: 1.6 mg/dL — ABNORMAL HIGH (ref 0.3–1.2)
Total Protein: 6.4 g/dL — ABNORMAL LOW (ref 6.5–8.1)

## 2020-02-06 LAB — PROTEIN / CREATININE RATIO, URINE
Creatinine, Urine: 579.99 mg/dL
Protein Creatinine Ratio: 0.22 mg/mg{Cre} — ABNORMAL HIGH (ref 0.00–0.15)
Total Protein, Urine: 126 mg/dL

## 2020-02-06 MED ORDER — FAMOTIDINE 20 MG PO TABS
20.0000 mg | ORAL_TABLET | Freq: Once | ORAL | Status: AC
  Administered 2020-02-06: 20 mg via ORAL
  Filled 2020-02-06: qty 1

## 2020-02-06 MED ORDER — CEPHALEXIN 500 MG PO CAPS
500.0000 mg | ORAL_CAPSULE | Freq: Once | ORAL | Status: AC
  Administered 2020-02-06: 500 mg via ORAL
  Filled 2020-02-06: qty 1

## 2020-02-06 MED ORDER — ONDANSETRON 4 MG PO TBDP
8.0000 mg | ORAL_TABLET | Freq: Once | ORAL | Status: AC
  Administered 2020-02-06: 8 mg via ORAL
  Filled 2020-02-06: qty 2

## 2020-02-06 NOTE — MAU Provider Note (Addendum)
History     CSN: [id number]  Arrival date and time: 02/06/20 2031   First Provider Initiated Contact with Patient 02/06/20 2136     Chief Complaint  Patient presents with  . Nausea  . Emesis  . Dizziness  . Shortness of Breath   Jody Robinson is a 29 y.o. G3P1 at [redacted]w[redacted]d who presents to MAU with complaints of nausea, emesis, lightheaded/dizziness, and SOB. Patient reports symptoms started occurring today after her baby shower. Patient reports standing up and started having lightheadedness and dizziness, she denies seeing any flashes, spotting or floaters. She denies having a headache. Patient reports eating at her baby shower and eating once prior to baby shower. She reports shortly after shower was over she started having N/V and SOB, patient denies taking any medication at home prior to arrival. Patient reports SOB does not get worse when laying down, denies having chest pain. She denies VB, LOF, or vaginal discharge. Patient reports + FM. Patient denies urinary symptoms or any other complaints.   OB History    Gravida  3   Para  1   Term  1   Preterm      AB  1   Living  1     SAB  1   TAB      Ectopic      Multiple      Live Births  1           Past Medical History:  Diagnosis Date  . Abnormal Pap smear of cervix   . Depression     Past Surgical History:  Procedure Laterality Date  . COLPOSCOPY      Family History  Problem Relation Age of Onset  . Diabetes Mother     Social History   Tobacco Use  . Smoking status: Never Smoker  . Smokeless tobacco: Never Used  Vaping Use  . Vaping Use: Never used  Substance Use Topics  . Alcohol use: Not Currently    Comment: occasional  . Drug use: No    Allergies: No Known Allergies  Medications Prior to Admission  Medication Sig Dispense Refill Last Dose  . cyclobenzaprine (FLEXERIL) 5 MG tablet Take 1 tablet (5 mg total) by mouth 2 (two) times daily as needed for muscle spasms. 10 tablet 0  02/05/2020 at Unknown time  . metoCLOPramide (REGLAN) 10 MG tablet Take 1 tablet (10 mg total) by mouth 3 (three) times daily with meals. 90 tablet 1 02/05/2020 at Unknown time  . Prenatal Vit-Fe Fumarate-FA (PRENATAL VITAMINS PO) Take by mouth.   02/05/2020 at Unknown time  . valACYclovir (VALTREX) 500 MG tablet Take 1 tablet (500 mg total) by mouth 2 (two) times daily. 60 tablet 6 02/05/2020 at Unknown time  . Elastic Bandages & Supports (COMFORT FIT MATERNITY SUPP SM) MISC 1 Units by Does not apply route daily as needed. (Patient not taking: Reported on 11/03/2019) 1 each 0   . ferrous sulfate 325 (65 FE) MG tablet Take 1 tablet (325 mg total) by mouth every other day. 30 tablet 1   . ondansetron (ZOFRAN) 4 MG tablet Take 1 tablet (4 mg total) by mouth daily as needed for nausea or vomiting. 30 tablet 1     Review of Systems  Constitutional: Negative.   Respiratory: Positive for shortness of breath. Negative for cough and chest tightness.   Cardiovascular: Negative.   Gastrointestinal: Positive for nausea and vomiting. Negative for abdominal pain, constipation and diarrhea.  Genitourinary: Negative.  Musculoskeletal: Negative.   Neurological: Positive for dizziness and light-headedness. Negative for syncope, weakness and headaches.  Psychiatric/Behavioral: Negative.    Physical Exam   Blood pressure 114/87, pulse (!) 114, temperature 98.4 F (36.9 C), temperature source Oral, resp. rate 18, weight 65 kg, last menstrual period 06/02/2019, SpO2 100 %.  Physical Exam Vitals and nursing note reviewed.  HENT:     Head: Normocephalic.  Cardiovascular:     Rate and Rhythm: Normal rate and regular rhythm.  Pulmonary:     Effort: Pulmonary effort is normal. No respiratory distress.     Breath sounds: Normal breath sounds. No wheezing.  Abdominal:     Palpations: Abdomen is soft.     Tenderness: There is no abdominal tenderness. There is no right CVA tenderness or left CVA tenderness.      Comments: Gravid appropriate for gestational age  Musculoskeletal:     Right lower leg: No edema.     Left lower leg: No edema.  Skin:    General: Skin is warm and dry.  Neurological:     Mental Status: She is alert and oriented to person, place, and time.  Psychiatric:        Mood and Affect: Mood normal.        Behavior: Behavior normal.    Fetal monitoring:  FHR- 140/moderate/+accels/no decel TOCO- irregular mild contractions - patient denies having contractions   MAU Course  Procedures  MDM Orders Placed This Encounter  Procedures  . Culture, OB Urine  . Urinalysis, Routine w reflex microscopic  . Protein / creatinine ratio, urine  . CBC with Differential/Platelet  . Comprehensive metabolic panel  . Orthostatic vital signs  . Encourage/Reinforce Importance Po Fluids   Meds ordered this encounter  Medications  . ondansetron (ZOFRAN-ODT) disintegrating tablet 8 mg   UA noted nitrates and large amount of protein in urine, based on + proteinuria - PCR added onto UA  Patient denies s/s of PEC, BP stable in MAU, will obtain baseline PEC labs d/t patient's presentation of symptoms in MAU   Treatments in MAU included Zofran ODT and hydration.  Orthostatic vital signs WNL  Labs reviewed:  Results for orders placed or performed during the hospital encounter of 02/06/20 (from the past 24 hour(s))  Urinalysis, Routine w reflex microscopic     Status: Abnormal   Collection Time: 02/06/20  9:08 PM  Result Value Ref Range   Color, Urine AMBER (A) YELLOW   APPearance HAZY (A) CLEAR   Specific Gravity, Urine 1.027 1.005 - 1.030   pH 6.0 5.0 - 8.0   Glucose, UA NEGATIVE NEGATIVE mg/dL   Hgb urine dipstick NEGATIVE NEGATIVE   Bilirubin Urine SMALL (A) NEGATIVE   Ketones, ur 5 (A) NEGATIVE mg/dL   Protein, ur >=300 (A) NEGATIVE mg/dL   Nitrite POSITIVE (A) NEGATIVE   Leukocytes,Ua NEGATIVE NEGATIVE   RBC / HPF 0-5 0 - 5 RBC/hpf   WBC, UA 11-20 0 - 5 WBC/hpf   Bacteria, UA  RARE (A) NONE SEEN   Squamous Epithelial / LPF 0-5 0 - 5   Mucus PRESENT    Hyaline Casts, UA PRESENT   Protein / creatinine ratio, urine     Status: Abnormal   Collection Time: 02/06/20  9:08 PM  Result Value Ref Range   Creatinine, Urine 579.99 mg/dL   Total Protein, Urine 126 mg/dL   Protein Creatinine Ratio 0.22 (H) 0.00 - 0.15 mg/mg[Cre]  CBC with Differential/Platelet     Status: Abnormal  Collection Time: 02/06/20 10:03 PM  Result Value Ref Range   WBC 17.3 (H) 4.0 - 10.5 K/uL   RBC 4.02 3.87 - 5.11 MIL/uL   Hemoglobin 9.7 (L) 12.0 - 15.0 g/dL   HCT 32.7 (L) 36 - 46 %   MCV 81.3 80.0 - 100.0 fL   MCH 24.1 (L) 26.0 - 34.0 pg   MCHC 29.7 (L) 30.0 - 36.0 g/dL   RDW 21.1 (H) 11.5 - 15.5 %   Platelets 483 (H) 150 - 400 K/uL   nRBC 0.3 (H) 0.0 - 0.2 %   Neutrophils Relative % 80 %   Neutro Abs 13.8 (H) 1.7 - 7.7 K/uL   Lymphocytes Relative 11 %   Lymphs Abs 1.8 0.7 - 4.0 K/uL   Monocytes Relative 5 %   Monocytes Absolute 0.9 0 - 1 K/uL   Eosinophils Relative 0 %   Eosinophils Absolute 0.0 0 - 0 K/uL   Basophils Relative 0 %   Basophils Absolute 0.1 0 - 0 K/uL   Immature Granulocytes 4 %   Abs Immature Granulocytes 0.69 (H) 0.00 - 0.07 K/uL  Comprehensive metabolic panel     Status: Abnormal   Collection Time: 02/06/20 10:03 PM  Result Value Ref Range   Sodium 138 135 - 145 mmol/L   Potassium 3.8 3.5 - 5.1 mmol/L   Chloride 102 98 - 111 mmol/L   CO2 22 22 - 32 mmol/L   Glucose, Bld 95 70 - 99 mg/dL   BUN 5 (L) 6 - 20 mg/dL   Creatinine, Ser 0.83 0.44 - 1.00 mg/dL   Calcium 8.6 (L) 8.9 - 10.3 mg/dL   Total Protein 6.4 (L) 6.5 - 8.1 g/dL   Albumin 2.5 (L) 3.5 - 5.0 g/dL   AST 23 15 - 41 U/L   ALT 12 0 - 44 U/L   Alkaline Phosphatase 194 (H) 38 - 126 U/L   Total Bilirubin 1.6 (H) 0.3 - 1.2 mg/dL   GFR calc non Af Amer >60 >60 mL/min   GFR calc Af Amer >60 >60 mL/min   Anion gap 14 5 - 15   PEC labs normal  1st dose of Keflex given in MAU tonight in addition to  pepcid for heartburn as patient requested.  Patient repots symptoms have resolved after treatment in MAU.  Discussed reasons to return to MAU. Follow up as scheduled in the office. Return to MAU as needed. Pt stable at time of discharge. Continue iron supplementation at home. Rx for keflex, pepcid and zofran sent to pharmacy of choice.   NST reactive and reassuring   Assessment and Plan   1. Urinary tract infection in mother during third trimester of pregnancy   2. Nausea and vomiting in pregnancy   3. Dizziness   4. [redacted] weeks gestation of pregnancy   5. NST (non-stress test) reactive    Discharge home Follow up as scheduled in the office for prenatal care Return to MAU as needed for reasons discussed and/or emergencies  Rx for pepcid, zofran and keflex  Encouraged increase in hydration    Follow-up [place] [facility] Follow up.   Specialty: Obstetrics and Gynecology Why: Follow up as scheduled for prenatal care and return to MAU as needed Contact information: [address] [place] [id number] [phone]             Allergies as of 02/07/2020   No Known Allergies  Medication List    STOP taking these medications   metoCLOPramide 10 MG tablet Commonly known as: REGLAN     TAKE these medications   cephALEXin 500 MG capsule Commonly known as: KEFLEX Take 1 capsule (500 mg total) by mouth 4 (four) times daily.   Comfort Fit Maternity Supp Sm Misc 1 Units by Does not apply route daily as needed.   cyclobenzaprine 5 MG tablet Commonly known as: FLEXERIL Take 1 tablet (5 mg total) by mouth 2 (two) times daily as needed for muscle spasms.   famotidine 20 MG tablet Commonly known as: PEPCID Take 1 tablet (20 mg total) by mouth 2 (two) times daily.   ferrous sulfate 325 (65 FE) MG tablet Take 1 tablet (325 mg total) by mouth every other day.   ondansetron 4 MG tablet Commonly  known as: Zofran Take 1 tablet (4 mg total) by mouth daily as needed for nausea or vomiting.   PRENATAL VITAMINS PO Take by mouth.   valACYclovir 500 MG tablet Commonly known as: VALTREX Take 1 tablet (500 mg total) by mouth 2 (two) times daily.      Lajean Manes CNM 02/07/2020, 3:21 AM

## 2020-02-06 NOTE — MAU Note (Addendum)
Patient reports to MAU for shortness of breath, abdominal pain when standing, and feeling lightheaded. Denies VBV/lof, +movement.

## 2020-02-07 DIAGNOSIS — R42 Dizziness and giddiness: Secondary | ICD-10-CM

## 2020-02-07 DIAGNOSIS — O2343 Unspecified infection of urinary tract in pregnancy, third trimester: Secondary | ICD-10-CM

## 2020-02-07 DIAGNOSIS — O219 Vomiting of pregnancy, unspecified: Secondary | ICD-10-CM

## 2020-02-07 DIAGNOSIS — Z3A35 35 weeks gestation of pregnancy: Secondary | ICD-10-CM

## 2020-02-07 DIAGNOSIS — O26893 Other specified pregnancy related conditions, third trimester: Secondary | ICD-10-CM

## 2020-02-07 MED ORDER — ONDANSETRON HCL 4 MG PO TABS: 4.0000 mg | ORAL_TABLET | Freq: Every day | ORAL | 2 refills | Status: DC | PRN

## 2020-02-07 MED ORDER — FAMOTIDINE 20 MG PO TABS: 20.0000 mg | ORAL_TABLET | Freq: Two times a day (BID) | ORAL | 0 refills | Status: DC

## 2020-02-07 MED ORDER — CEPHALEXIN 500 MG PO CAPS
500.0000 mg | ORAL_CAPSULE | Freq: Four times a day (QID) | ORAL | 0 refills | Status: DC
Start: 2020-02-07 — End: 2020-03-01

## 2020-02-08 LAB — CULTURE, OB URINE

## 2020-02-10 ENCOUNTER — Ambulatory Visit (INDEPENDENT_AMBULATORY_CARE_PROVIDER_SITE_OTHER): Payer: 59 | Admitting: Medical

## 2020-02-10 ENCOUNTER — Other Ambulatory Visit (HOSPITAL_COMMUNITY)
Admission: RE | Admit: 2020-02-10 | Discharge: 2020-02-10 | Disposition: A | Discharge status: Home / Self Care | Payer: 59 | Source: Ambulatory Visit | Attending: Medical | Admitting: Medical

## 2020-02-10 ENCOUNTER — Other Ambulatory Visit: Payer: Self-pay

## 2020-02-10 VITALS — BP 110/77 | HR 108 | Wt 141.8 lb

## 2020-02-10 DIAGNOSIS — K859 Acute pancreatitis without necrosis or infection, unspecified: Secondary | ICD-10-CM

## 2020-02-10 DIAGNOSIS — O219 Vomiting of pregnancy, unspecified: Secondary | ICD-10-CM

## 2020-02-10 DIAGNOSIS — R7989 Other specified abnormal findings of blood chemistry: Secondary | ICD-10-CM

## 2020-02-10 DIAGNOSIS — K828 Other specified diseases of gallbladder: Secondary | ICD-10-CM

## 2020-02-10 DIAGNOSIS — B009 Herpesviral infection, unspecified: Secondary | ICD-10-CM

## 2020-02-10 DIAGNOSIS — Z3A36 36 weeks gestation of pregnancy: Secondary | ICD-10-CM

## 2020-02-10 DIAGNOSIS — O98513 Other viral diseases complicating pregnancy, third trimester: Secondary | ICD-10-CM

## 2020-02-10 DIAGNOSIS — O0990 Supervision of high risk pregnancy, unspecified, unspecified trimester: Secondary | ICD-10-CM | POA: Insufficient documentation

## 2020-02-10 DIAGNOSIS — O99613 Diseases of the digestive system complicating pregnancy, third trimester: Secondary | ICD-10-CM

## 2020-02-10 DIAGNOSIS — O0993 Supervision of high risk pregnancy, unspecified, third trimester: Secondary | ICD-10-CM

## 2020-02-10 DIAGNOSIS — R945 Abnormal results of liver function studies: Secondary | ICD-10-CM

## 2020-02-10 NOTE — Patient Instructions (Addendum)
Fetal Movement Counts °Patient Name: ________________________________________________ Patient Due Date: ____________________ °What is a fetal movement count? ° °A fetal movement count is the number of times that you feel your baby move during a certain amount of time. This may also be called a fetal kick count. A fetal movement count is recommended for every pregnant woman. You may be asked to start counting fetal movements as early as week 28 of your pregnancy. °Pay attention to when your baby is most active. You may notice your baby's sleep and wake cycles. You may also notice things that make your baby move more. You should do a fetal movement count: °· When your baby is normally most active. °· At the same time each day. °A good time to count movements is while you are resting, after having something to eat and drink. °How do I count fetal movements? °1. Find a quiet, comfortable area. Sit, or lie down on your side. °2. Write down the date, the start time and stop time, and the number of movements that you felt between those two times. Take this information with you to your health care visits. °3. Write down your start time when you feel the first movement. °4. Count kicks, flutters, swishes, rolls, and jabs. You should feel at least 10 movements. °5. You may stop counting after you have felt 10 movements, or if you have been counting for 2 hours. Write down the stop time. °6. If you do not feel 10 movements in 2 hours, contact your health care provider for further instructions. Your health care provider may want to do additional tests to assess your baby's well-being. °Contact a health care provider if: °· You feel fewer than 10 movements in 2 hours. °· Your baby is not moving like he or she usually does. °Date: ____________ Start time: ____________ Stop time: ____________ Movements: ____________ °Date: ____________ Start time: ____________ Stop time: ____________ Movements: ____________ °Date: ____________  Start time: ____________ Stop time: ____________ Movements: ____________ °Date: ____________ Start time: ____________ Stop time: ____________ Movements: ____________ °Date: ____________ Start time: ____________ Stop time: ____________ Movements: ____________ °Date: ____________ Start time: ____________ Stop time: ____________ Movements: ____________ °Date: ____________ Start time: ____________ Stop time: ____________ Movements: ____________ °Date: ____________ Start time: ____________ Stop time: ____________ Movements: ____________ °Date: ____________ Start time: ____________ Stop time: ____________ Movements: ____________ °This information is not intended to replace advice given to you by your health care provider. Make sure you discuss any questions you have with your health care provider. °Document Revised: 03/18/2019 Document Reviewed: 03/18/2019 °Elsevier Patient Education © 2020 Elsevier Inc. °Braxton Hicks Contractions °Contractions of the uterus can occur throughout pregnancy, but they are not always a sign that you are in labor. You may have practice contractions called Braxton Hicks contractions. These false labor contractions are sometimes confused with true labor. °What are Braxton Hicks contractions? °Braxton Hicks contractions are tightening movements that occur in the muscles of the uterus before labor. Unlike true labor contractions, these contractions do not result in opening (dilation) and thinning of the cervix. Toward the end of pregnancy (32-34 weeks), Braxton Hicks contractions can happen more often and may become stronger. These contractions are sometimes difficult to tell apart from true labor because they can be very uncomfortable. You should not feel embarrassed if you go to the hospital with false labor. °Sometimes, the only way to tell if you are in true labor is for your health care provider to look for changes in the cervix. The health care provider   will do a physical exam and may  monitor your contractions. If you are not in true labor, the exam should show that your cervix is not dilating and your water has not broken. °If there are no other health problems associated with your pregnancy, it is completely safe for you to be sent home with false labor. You may continue to have Braxton Hicks contractions until you go into true labor. °How to tell the difference between true labor and false labor °True labor °· Contractions last 30-70 seconds. °· Contractions become very regular. °· Discomfort is usually felt in the top of the uterus, and it spreads to the lower abdomen and low back. °· Contractions do not go away with walking. °· Contractions usually become more intense and increase in frequency. °· The cervix dilates and gets thinner. °False labor °· Contractions are usually shorter and not as strong as true labor contractions. °· Contractions are usually irregular. °· Contractions are often felt in the front of the lower abdomen and in the groin. °· Contractions may go away when you walk around or change positions while lying down. °· Contractions get weaker and are shorter-lasting as time goes on. °· The cervix usually does not dilate or become thin. °Follow these instructions at home: ° °· Take over-the-counter and prescription medicines only as told by your health care provider. °· Keep up with your usual exercises and follow other instructions from your health care provider. °· Eat and drink lightly if you think you are going into labor. °· If Braxton Hicks contractions are making you uncomfortable: °? Change your position from lying down or resting to walking, or change from walking to resting. °? Sit and rest in a tub of warm water. °? Drink enough fluid to keep your urine pale yellow. Dehydration may cause these contractions. °? Do slow and deep breathing several times an hour. °· Keep all follow-up prenatal visits as told by your health care provider. This is important. °Contact a  health care provider if: °· You have a fever. °· You have continuous pain in your abdomen. °Get help right away if: °· Your contractions become stronger, more regular, and closer together. °· You have fluid leaking or gushing from your vagina. °· You pass blood-tinged mucus (bloody show). °· You have bleeding from your vagina. °· You have low back pain that you never had before. °· You feel your baby’s head pushing down and causing pelvic pressure. °· Your baby is not moving inside you as much as it used to. °Summary °· Contractions that occur before labor are called Braxton Hicks contractions, false labor, or practice contractions. °· Braxton Hicks contractions are usually shorter, weaker, farther apart, and less regular than true labor contractions. True labor contractions usually become progressively stronger and regular, and they become more frequent. °· Manage discomfort from Braxton Hicks contractions by changing position, resting in a warm bath, drinking plenty of water, or practicing deep breathing. °This information is not intended to replace advice given to you by your health care provider. Make sure you discuss any questions you have with your health care provider. °Document Revised: 07/11/2017 Document Reviewed: 12/12/2016 °Elsevier Patient Education © 2020 Elsevier Inc. ° °AREA PEDIATRIC/FAMILY PRACTICE PHYSICIANS ° °[place] ([address]) °• [facility] °o Chambliss, MD; Eniola, MD; Hale, MD; Hensel, MD; McDiarmid, MD; McIntyer, MD; Neal, MD; Walden, MD °o [address] [place], [place] [address] °o [phone] °o Mon-Fri 8:30-12:30, 1:30-5:00 °o Providers come to see babies   at [facility] °o Accepting Medicaid °• [facility] °o Limited providers who accept newborns: Koirala, MD; Morrow, MD; Wolters, MD °o [address], [place], [place] [address] °o [phone] °o Mon-Fri 8:00-5:30 °o Babies seen by providers at  [facility] °o Does NOT accept Medicaid °o Please call early in hospitalization for appointment (limited availability)  °• [facility] °o Mulberry, MD °o [address] [place], [place] [address] °o [phone] °o Mon, Tue, Thur, Fri 8:30-5:00, Wed 10:00-7:00 (closed 1-2pm) °o Babies seen by [facility] providers °o Accepting Medicaid °• [facility] °o Rubin, MD °o [address], [place], [place] [address] °o [phone] °o Mon-Fri 8:30-5:00, Sat 8:30-12:00 °o Provider comes to see babies at [facility] °o Accepting Medicaid °o Must have been referred from current patients or contacted office prior to delivery °• [facility] ([facility]) °o Brown, MD; Chandler, MD; Ettefagh, MD; Grant, MD; Lester, MD; McCormick, MD; McQueen, MD; Prose, MD; Simha, MD; Stanley, MD; Stryffeler, NP; Tebben, NP °o [address], [place], [place] [address] °o [phone] °o Mon, Tue, Thur, Fri 8:30-5:30, Wed 9:30-5:30, Sat 8:30-12:30 °o Babies seen by [facility] providers °o Accepting Medicaid °o Only accepting infants of first-time parents or siblings of current patients °o Hospital discharge coordinator will make follow-up appointment °• Jack Amos °o [address], [place], [place]  [address] °o [phone]   Fax - [phone] °• [facility] °o [address], [place], [place]  [address] °o Phone - [phone]   Fax - [phone] °• Shilpa Gosrani °o [address], [place], [place]  [address] °o [phone] ° °[place] ([address]) °• [facility] °o Bates, MD; Brassfield, MD; Cooper, Cox, MD; MD; Davis, MD; Dovico, MD; Ettefaugh, MD; Little, MD; Lowe, MD; Keiffer, MD; Melvin, MD; Sumner, MD; Williams, MD °o [address], [place], [place] [address] °o [phone] °o Mon-Fri 8:30-5:00 (extended evenings Mon-Thur as needed), Sat-Sun  10:00-1:00 °o Providers come to see babies at [facility] °o Accepting Medicaid for families of first-time babies and families with all children in the household age 3 and under. Must register with office prior to making appointment (M-F only). °• [facility] °o Henson, NP; Knapp, MD; Lalonde, MD; Tysinger, PA °o [address] [place], [place] [address] °o [phone] °o Mon-Fri 8:00-5:00 °o Babies seen by providers at [facility] °o Does NOT accept Medicaid/Commercial Insurance Only °• [facility] ([facility]  °o Artis, MD; Barnes, MD; Bratton, MD; Coccaro, MD; Lockett Gardner, MD; Kramer, MD; Marshall, MD; Netherton, MD; Poleto, MD; Skinner, MD °o [address] [place], [place] [address] °o [phone] °o Mon-Fri 8:30-5:30, Sat (Oct.-Mar.) 9:00-1:00 °o Babies seen by providers at [facility] °o Accepting Medicaid ° °[place] ([address]) °• [facility] °o Reid, MD; Warner, MD °o [address], [place], [place] [address] °o [phone] °o Mon-Fri 8:30-5:00, Sat 8:30-12:00 °o Providers come to see babies at [facility] °o Does NOT accept Medicaid °• [facility] °o Becker, PA; Hagler, MD; Scifres, PA; Sun, MD; Swayne, MD °o [address], ,  [address] °o [phone] °o Mon-Fri 8:00-5:00 °o Babies seen by providers at [facility] °o Does NOT accept Medicaid °o Only accepting babies of parents who are patients °o Please call   early in hospitalization for appointment (limited availability) °• [facility] °o Clark, MD; Frye, MD; Kelleher, MD; Mack, NP; Miller, MD; O'Keller, MD; Patterson, NP; Pudlo, MD; Puzio, MD; Thomas, MD; Tucker, MD; Twiselton, MD °o [address], [place], [place] [address] °o [phone] °o Mon-Fri 8:00-5:00, Sat 9:00-12:00 °o Providers come to see babies at [facility] °o Does NOT accept  Medicaid ° °[place] ([address]) °• [facility] °o Limited providers accepting new patients: Brake, NP; Wharton, PA °o [address], [place], [place] [address] °o [phone] °o Mon-Fri 8:00-5:00 °o Babies seen by providers at [facility] °o Does NOT accept Medicaid °o Only accepting babies of parents who are patients °o Please call early in hospitalization for appointment (limited availability) °• [facility] °o Gay, MD; Quinlan, MD °o [address] [place], [place] [address] °o [phone] (press 1 to schedule appointment) °o Mon-Fri 8:00-5:00 °o Providers come to see babies at [facility] °o Does NOT accept Medicaid °• [facility] °o Mazer, MD °o [address] [place], [place] [address] °o [phone] °o Mon-Fri 8:30-5:00 (lunch 12:30-1:00), extended hours by appointment only Wed 5:00-6:30 °o Babies seen by [facility] providers °o Accepting Medicaid °• [facility] °o Banks, MD; Jordan, MD; Koberlein, MD °o [address], [place], [place] [address] °o [phone] °o Mon-Fri 8:00-5:00 °o Babies seen by [facility] providers °o Does NOT accept Medicaid °• [facility] °o Parker, MD; Hunter, MD; Wallace, DO °o [address] [place], [place] [address] °o [phone] °o Mon-Fri 8:00-5:00 °o Babies seen by [facility] providers °o Does NOT accept Medicaid °• [facility] °o Brandon, PA; Brecken, PA; Christy, NP; Dees, MD; DeClaire, MD; DeWeese, MD; Hansen, NP; Mills, NP; Parrish, NP; Smoot, NP; Summer, MD; Vapne, MD °o [address] [place], [place] [address] °o [phone] °o Mon-Fri 8:30-5:00, Sat 10:00-1:00 °o Providers come to see babies at [facility] °o Does NOT accept Medicaid °o Free prenatal information session Tuesdays at 4:45pm °• [facility] °o Bouska, MD; Gordon, PA; Jeffery, PA; Weber, PA °o [address] [place] [address] °o [phone] °o Mon-Fri 7:30-5:30 °o Babies seen by [facility] providers °• [facility] °o [address], [place], [place]  [address] °o [phone]   Fax - [phone] ° °[place] ([address]) °• [facility] °o Reese, MD °o [address] [place], [place] [address] °o [phone] °o Mon-Thur 8:00-6:00 °o Providers come to see babies at [facility] °o Accepting Medicaid °• [facility] °o Anderson, NP; Badger, MD; Beal, PA; [place], PA °o [address] [place], [place] [address] °o [phone] °o Mon-Thur 7:30-7:30, Fri 7:30-4:30 °o Babies seen by [facility] providers °o Accepting Medicaid °• [facility] °o Agbuya, MD; Klett, NP; Romgoolam, MD °o [address], [place], [place] [address] °o [phone] °o Mon-Fri 8:30-5:00, Sat 8:30-12:00 °o Providers come to see babies at [facility] °o Accepting Medicaid °o Must have “Meet & Greet” appointment at office prior to delivery °• [facility] ([facility]) °o McCord, MD; Wallace, MD; Wood, MD °o [address], [place],  [address] °o [phone] °o Mon-Wed 8:00-6:00, Thur-Fri 8:00-5:00, Sat 9:00-12:00 °o Providers come to see babies at [facility] °o Does NOT accept Medicaid °o Only accepting siblings of current patients °• [facility]   °  o [address], [place], [place]  [address] °o [phone]   Fax - [phone] °• [facility] °o [address], [place], [place]  [address] °o [phone]   Fax - [phone] ° °[place] ([address]) °• [facility] °o Cirigliano, DO; Matthews, DO °o [address] [place], [place] [address] °o [phone] °o Mon-Fri 7:00-5:00 °o Babies seen by [facility] providers °o Does NOT  accept Medicaid °• [facility] °o Briscoe, MD; Howley, PA; Moreira, PA °o [address], [place], [place] [address] °o [phone] °o Mon-Fri 8:00-5:00 °o Babies seen by [facility] providers °o Accepting Medicaid °• [facility] °o Boyd, MD; Church, PA; Jones, NP; Osborn, PA °o [address], [place], [place] [address] °o [phone] °o Mon-Fri 8:00-5:00 °o Babies seen by providers at [facility] °o Accepting Medicaid ° °[place] ([address]) °• [facility] [place] °o Wendling, DO °o [address] [place], [place] [address] °o [phone] °o Mon-Fri 8:00-5:00 °o Babies seen by [facility] providers °o Does NOT accept Medicaid °o Limited availability, please call early in hospitalization to schedule follow-up °• [facility] °o Calderon, PA; Cummings, MD; Dillard, MD; Martin, PA; Olson, MD; VanDeven, PA °o [address], [place], [place] [address] °o [phone] °o Mon-Fri 8:30-5:00, Sat 9:00-12:00 °o Babies seen by providers at [facility] °o Accepting Medicaid °o Please register online then schedule online or call office °o [email or web address] °• [facility] ([facility] °o Hunter, NP; Kumar, MD; Martin Rogers, PA °o [address], [place], [place] [address] °o [phone] °o Mon-Fri 8:00-5:00 °o Babies seen by providers at [facility] °o Accepting Medicaid °• [facility] ([facility] °o Camp Dennison, MD; Kristi Fleenor, NP; West, MD °o [address], [place], [place] [address] °o [phone] °o Mon-Fri 8:00-5:30, Sat&Sun by appointment (phones open at 8:30) °o Babies seen by [facility] providers °o Accepting Medicaid °o Must be a first-time baby or sibling of current patient °• [facility] [place]  °o [address], [place], [place]  [address] °o [phone]   Fax - [phone] ° °[place] ([address]) °• [place] [facility] °o Brown, PA; Cowen, PA; Rice, MD; Helton, PA; Spry, MD °o [address] [place], [place] [address] °o [phone] °o Mon-Thur 8:00-7:00, Fri 8:00-5:00, Sat 8:00-12:00, Sun 9:00-12:00 °o Babies seen by [facility] providers °o Accepting Medicaid °• [facility] [place] °o Coe-Goins, MD; Marshall, MD; Pierre-Louis, MD °o [address], [place],  [address] °o [phone] °o Mon-Thur 8:00-5:00 °o Babies seen by providers at [facility] °o Accepting Medicaid °• [facility] °o Bratton, MD; Coe-Goins, MD; Hayes, MD; Lewis, MD; List, MD; Lott, MD; Marshall, MD; Moran, MD; O'Neal, MD; Pierre-Louis, MD; Pitonzo, MD; Scholer, MD; Spangle, MD °o [address] [place],  [address] °o [phone] °o Mon-Fri 8:00-5:30, Sat (Oct.-Mar.) 9:00-1:00 °o Babies seen by providers at [facility] °o Accepting Medicaid °o Must fill out new patient packet, available online at [email or web address] °• [facility] ([facility]) °o Friddle, NP; Harris, NP; Kelly, NP; Logan,   MD; Melvin, PA; Poth, MD; Ramadoss, MD; Stanton, NP °o [address], [place], [place] [address] °o [phone] °o Mon-Thur 8:00-5:30, Fri 8:00-5:00 °o Babies seen by providers at [facility] °o Accepting Medicaid ° °[place] ([address]) °• [facility] °o Dixon, PA; Tennille, MD; Pickard, MD; Tapia, PA °o [address], [place], [place] [address] °o [phone] °o Mon-Fri 8:00-5:00 °o Babies seen by providers at [facility] °o Accepting Medicaid  ° °[place] ([address]) °• [facility] [place] °o Masneri, DO; Meyers, MD; Nelson, PA °o [address], [place], [place]  [address] °o [phone] °o Mon-Fri 8:00-5:00 °o Babies seen by providers at [facility] °o Does NOT accept Medicaid °o Limited appointment availability, please call early in hospitalization ° °• [facility] [place] °o Kunedd, DO; McGowen, MD °o [address], [place], [place] [address] °o [phone] °o Mon-Fri 8:00-5:00 °o Babies seen by [facility] providers °o Does NOT accept Medicaid °• [facility] [place] °o Cameron, MD; MacDonald, MD; Michaels, PA; Nayak, MD °o [address] [place] [address], [place], [place] [address] °o [phone] °o Mon-Fri 8:00-5:00 °o After hours clinic ([address] [place], [place] [address]) [phone] Mon-Fri 5:00-8:00, Sat 12:00-6:00, Sun 10:00-4:00 °o Babies seen by [facility] providers °o Accepting Medicaid °• [facility] [place] °o [address], [place], [place]  [address] °o [phone]   Fax - [phone] ° °[place] ([address]) °• [facility] [place] °o Andy, MD °o [address], [place], [place] [address] °o [phone] °o Mon-Fri 8:00-5:00 °o Babies seen by [facility] providers °o Does NOT accept Medicaid °• [facility] [place] ([facility] [place]) °o Eksir, MD °o [address], [place], [place] [address] °o [phone] °o Mon-Thur 8:00-7:00, Fri 8:00-5:00, Sat 8:00-12:00 °o Babies seen by providers at [facility] °o Accepting Medicaid - but does not have vaccinations in office (must be received elsewhere) °o Limited availability, please call early in hospitalization ° °[place] ([address]) °• [facility]  °o Charlene Flemming, MD °o [address], [place] [address] °o [phone]  Fax [phone] ° ° °

## 2020-02-10 NOTE — Progress Notes (Signed)
   PRENATAL VISIT NOTE  Subjective:  Jody Robinson is a 29 y.o. G3P1011 at [redacted]w[redacted]d being seen today for ongoing prenatal care.  She is currently monitored for the following issues for this high-risk pregnancy and has History of IUFD; Abnormal LFTs; Supervision of high risk pregnancy, antepartum; Depression; Nausea/vomiting in pregnancy; Hyperemesis gravidarum; Seasonal allergies; Herpes simplex; History of abnormal cervical Pap smear; Thrombocytosis ([place]); Pancreatitis, acute; Gallbladder sludge; COVID-19; and Low TSH level on their problem list.  Patient reports no complaints.  Contractions: Irritability. Vag. Bleeding: None.  Movement: Present. Denies leaking of fluid.   The following portions of the patient's history were reviewed and updated as appropriate: allergies, current medications, past family history, past medical history, past social history, past surgical history and problem list.   Objective:   Vitals:   02/10/20 1602  BP: 110/77  Pulse: (!) 108  Weight: 141 lb 12.8 oz (64.3 kg)    Fetal Status: Fetal Heart Rate (bpm): 162 Fundal Height: 36 cm Movement: Present  Presentation: Vertex  General:  Alert, oriented and cooperative. Patient is in no acute distress.  Skin: Skin is warm and dry. No rash noted.   Cardiovascular: Normal heart rate noted  Respiratory: Normal respiratory effort, no problems with respiration noted  Abdomen: Soft, gravid, appropriate for gestational age.  Pain/Pressure: Present     Pelvic: Cervical exam performed in the presence of a chaperone Dilation: 2.5 Effacement (%): 50 Station: -3  Extremities: Normal range of motion.  Edema: None  Mental Status: Normal mood and affect. Normal behavior. Normal judgment and thought content.   Assessment and Plan:  Pregnancy: G3P1011 at [redacted]w[redacted]d 1. Supervision of high risk pregnancy, antepartum - GC/Chlamydia probe amp ()not at [facility] - Culture, beta strep (group b only) - Peds list given and patient  encouraged to decide prior to delivery  2. Herpes simplex - Patient does not want to discuss  3. Abnormal LFTs - Due to HG  4. Nausea/vomiting in pregnancy - Improving, taking Reglan PRN   5. Acute pancreatitis, unspecified complication status, unspecified pancreatitis type   6. Gallbladder sludge  Preterm labor symptoms and general obstetric precautions including but not limited to vaginal bleeding, contractions, leaking of fluid and fetal movement were reviewed in detail with the patient. Please refer to After Visit Summary for other counseling recommendations.   No follow-ups on file.  No future appointments.  Kerry Hough, PA-C

## 2020-02-11 LAB — GC/CHLAMYDIA PROBE AMP (~~LOC~~) NOT AT ARMC
Chlamydia: NEGATIVE
Comment: NEGATIVE
Comment: NORMAL
Neisseria Gonorrhea: NEGATIVE

## 2020-02-13 LAB — CULTURE, BETA STREP (GROUP B ONLY): Strep Gp B Culture: POSITIVE — AB

## 2020-02-16 ENCOUNTER — Encounter: Payer: Self-pay | Admitting: Student

## 2020-02-21 ENCOUNTER — Ambulatory Visit (INDEPENDENT_AMBULATORY_CARE_PROVIDER_SITE_OTHER): Payer: 59 | Admitting: Medical

## 2020-02-21 ENCOUNTER — Other Ambulatory Visit: Payer: Self-pay

## 2020-02-21 VITALS — BP 108/76 | HR 96

## 2020-02-21 DIAGNOSIS — R7989 Other specified abnormal findings of blood chemistry: Secondary | ICD-10-CM

## 2020-02-21 DIAGNOSIS — O99613 Diseases of the digestive system complicating pregnancy, third trimester: Secondary | ICD-10-CM

## 2020-02-21 DIAGNOSIS — Z3A37 37 weeks gestation of pregnancy: Secondary | ICD-10-CM | POA: Diagnosis not present

## 2020-02-21 DIAGNOSIS — O99891 Other specified diseases and conditions complicating pregnancy: Secondary | ICD-10-CM

## 2020-02-21 DIAGNOSIS — O0993 Supervision of high risk pregnancy, unspecified, third trimester: Secondary | ICD-10-CM | POA: Diagnosis not present

## 2020-02-21 DIAGNOSIS — D473 Essential (hemorrhagic) thrombocythemia: Secondary | ICD-10-CM

## 2020-02-21 DIAGNOSIS — O99820 Streptococcus B carrier state complicating pregnancy: Secondary | ICD-10-CM

## 2020-02-21 DIAGNOSIS — O98513 Other viral diseases complicating pregnancy, third trimester: Secondary | ICD-10-CM

## 2020-02-21 DIAGNOSIS — K828 Other specified diseases of gallbladder: Secondary | ICD-10-CM

## 2020-02-21 DIAGNOSIS — D75839 Thrombocytosis, unspecified: Secondary | ICD-10-CM

## 2020-02-21 DIAGNOSIS — O280 Abnormal hematological finding on antenatal screening of mother: Secondary | ICD-10-CM

## 2020-02-21 DIAGNOSIS — K859 Acute pancreatitis without necrosis or infection, unspecified: Secondary | ICD-10-CM

## 2020-02-21 DIAGNOSIS — B009 Herpesviral infection, unspecified: Secondary | ICD-10-CM

## 2020-02-21 DIAGNOSIS — O368130 Decreased fetal movements, third trimester, not applicable or unspecified: Secondary | ICD-10-CM | POA: Diagnosis not present

## 2020-02-21 DIAGNOSIS — O98819 Other maternal infectious and parasitic diseases complicating pregnancy, unspecified trimester: Secondary | ICD-10-CM | POA: Insufficient documentation

## 2020-02-21 DIAGNOSIS — O0990 Supervision of high risk pregnancy, unspecified, unspecified trimester: Secondary | ICD-10-CM

## 2020-02-21 NOTE — Progress Notes (Signed)
   PRENATAL VISIT NOTE  Subjective:  Jody Robinson is a 29 y.o. G3P1011 at [redacted]w[redacted]d being seen today for ongoing prenatal care.  She is currently monitored for the following issues for this high-risk pregnancy and has History of IUFD; Abnormal LFTs; Supervision of high risk pregnancy, antepartum; Depression; Nausea/vomiting in pregnancy; Hyperemesis gravidarum; Seasonal allergies; Herpes simplex; History of abnormal cervical Pap smear; Thrombocytosis ([place]); Pancreatitis, acute; Gallbladder sludge; COVID-19; Low TSH level; and Group B streptococcal infection during pregnancy on their problem list.  Patient reports no complaints.  Contractions: Irritability. Vag. Bleeding: None.  Movement: (!) Decreased. Denies leaking of fluid.   The following portions of the patient's history were reviewed and updated as appropriate: allergies, current medications, past family history, past medical history, past social history, past surgical history and problem list.   Objective:   Vitals:   02/21/20 1108  BP: 108/76  Pulse: 96    Fetal Status: Fetal Heart Rate (bpm): 146 Fundal Height: 36 cm Movement: (!) Decreased     General:  Alert, oriented and cooperative. Patient is in no acute distress.  Skin: Skin is warm and dry. No rash noted.   Cardiovascular: Normal heart rate noted  Respiratory: Normal respiratory effort, no problems with respiration noted  Abdomen: Soft, gravid, appropriate for gestational age.  Pain/Pressure: Present     Pelvic: Cervical exam deferred        Extremities: Normal range of motion.  Edema: None  Mental Status: Normal mood and affect. Normal behavior. Normal judgment and thought content.   Assessment and Plan:  Pregnancy: G3P1011 at [redacted]w[redacted]d 1. Supervision of high risk pregnancy, antepartum - Has had both doses of Fereheme  - Encouraged again to chose pediatrician   2. Herpes simplex - patient does not want to discuss  3. Group B streptococcal infection during  pregnancy - +GBS at 36 weeks, treat in labor, patient advised   4. Decreased Fetal Movement - No movement two days ago, some movement yesterday, ~ 3 movements noted today   - NST today   Fetal Monitoring: Baseline: 140 bpm Variability: moderate Accelerations: 15 x 15 (few), multiple 10 x 10  Decelerations: none Contractions: none    Term labor symptoms and general obstetric precautions including but not limited to vaginal bleeding, contractions, leaking of fluid and fetal movement were reviewed in detail with the patient. Please refer to After Visit Summary for other counseling recommendations.   Return in about 1 week (around 02/28/2020) for LOB, In-Person - MD preferred .  No future appointments.  Kerry Hough, PA-C

## 2020-02-21 NOTE — Patient Instructions (Signed)
Fetal Movement Counts Patient Name: ________________________________________________ Patient Due Date: ____________________ What is a fetal movement count?  A fetal movement count is the number of times that you feel your baby move during a certain amount of time. This may also be called a fetal kick count. A fetal movement count is recommended for every pregnant woman. You may be asked to start counting fetal movements as early as week 28 of your pregnancy. Pay attention to when your baby is most active. You may notice your baby's sleep and wake cycles. You may also notice things that make your baby move more. You should do a fetal movement count:  When your baby is normally most active.  At the same time each day. A good time to count movements is while you are resting, after having something to eat and drink. How do I count fetal movements? 1. Find a quiet, comfortable area. Sit, or lie down on your side. 2. Write down the date, the start time and stop time, and the number of movements that you felt between those two times. Take this information with you to your health care visits. 3. Write down your start time when you feel the first movement. 4. Count kicks, flutters, swishes, rolls, and jabs. You should feel at least 10 movements. 5. You may stop counting after you have felt 10 movements, or if you have been counting for 2 hours. Write down the stop time. 6. If you do not feel 10 movements in 2 hours, contact your health care provider for further instructions. Your health care provider may want to do additional tests to assess your baby's well-being. Contact a health care provider if:  You feel fewer than 10 movements in 2 hours.  Your baby is not moving like he or she usually does. Date: ____________ Start time: ____________ Stop time: ____________ Movements: ____________ Date: ____________ Start time: ____________ Stop time: ____________ Movements: ____________ Date: ____________  Start time: ____________ Stop time: ____________ Movements: ____________ Date: ____________ Start time: ____________ Stop time: ____________ Movements: ____________ Date: ____________ Start time: ____________ Stop time: ____________ Movements: ____________ Date: ____________ Start time: ____________ Stop time: ____________ Movements: ____________ Date: ____________ Start time: ____________ Stop time: ____________ Movements: ____________ Date: ____________ Start time: ____________ Stop time: ____________ Movements: ____________ Date: ____________ Start time: ____________ Stop time: ____________ Movements: ____________ This information is not intended to replace advice given to you by your health care provider. Make sure you discuss any questions you have with your health care provider. Document Revised: 03/18/2019 Document Reviewed: 03/18/2019 Elsevier Patient Education  2020 Elsevier [employer] of the uterus can occur throughout pregnancy, but they are not always a sign that you are in labor. You may have practice contractions called Braxton Hicks contractions. These false labor contractions are sometimes confused with true labor. What are [place] contractions? Braxton Hicks contractions are tightening movements that occur in the muscles of the uterus before labor. Unlike true labor contractions, these contractions do not result in opening (dilation) and thinning of the cervix. Toward the end of pregnancy (32-34 weeks), Braxton Hicks contractions can happen more often and may become stronger. These contractions are sometimes difficult to tell apart from true labor because they can be very uncomfortable. You should not feel embarrassed if you go to the hospital with false labor. Sometimes, the only way to tell if you are in true labor is for your health care provider to look for changes in the cervix. The health care provider  will do a physical exam and may  monitor your contractions. If you are not in true labor, the exam should show that your cervix is not dilating and your water has not broken. If there are no other health problems associated with your pregnancy, it is completely safe for you to be sent home with false labor. You may continue to have Braxton Hicks contractions until you go into true labor. How to tell the difference between true labor and false labor True labor  Contractions last 30-70 seconds.  Contractions become very regular.  Discomfort is usually felt in the top of the uterus, and it spreads to the lower abdomen and low back.  Contractions do not go away with walking.  Contractions usually become more intense and increase in frequency.  The cervix dilates and gets thinner. False labor  Contractions are usually shorter and not as strong as true labor contractions.  Contractions are usually irregular.  Contractions are often felt in the front of the lower abdomen and in the groin.  Contractions may go away when you walk around or change positions while lying down.  Contractions get weaker and are shorter-lasting as time goes on.  The cervix usually does not dilate or become thin. Follow these instructions at home:   Take over-the-counter and prescription medicines only as told by your health care provider.  Keep up with your usual exercises and follow other instructions from your health care provider.  Eat and drink lightly if you think you are going into labor.  If Braxton Hicks contractions are making you uncomfortable: ? Change your position from lying down or resting to walking, or change from walking to resting. ? Sit and rest in a tub of warm water. ? Drink enough fluid to keep your urine pale yellow. Dehydration may cause these contractions. ? Do slow and deep breathing several times an hour.  Keep all follow-up prenatal visits as told by your health care provider. This is important. Contact a  health care provider if:  You have a fever.  You have continuous pain in your abdomen. Get help right away if:  Your contractions become stronger, more regular, and closer together.  You have fluid leaking or gushing from your vagina.  You pass blood-tinged mucus (bloody show).  You have bleeding from your vagina.  You have low back pain that you never had before.  You feel your baby's head pushing down and causing pelvic pressure.  Your baby is not moving inside you as much as it used to. Summary  Contractions that occur before labor are called Braxton Hicks contractions, false labor, or practice contractions.  Braxton Hicks contractions are usually shorter, weaker, farther apart, and less regular than true labor contractions. True labor contractions usually become progressively stronger and regular, and they become more frequent.  Manage discomfort from Braxton Hicks contractions by changing position, resting in a warm bath, drinking plenty of water, or practicing deep breathing. This information is not intended to replace advice given to you by your health care provider. Make sure you discuss any questions you have with your health care provider. Document Revised: 07/11/2017 Document Reviewed: 12/12/2016 Elsevier Patient Education  2020 Elsevier Inc.  

## 2020-02-22 NOTE — BH Specialist Note (Signed)
Integrated Behavioral Health via Telemedicine Video (Caregility) Visit  02/22/2020 Jody Robinson [id number]  Number of [place] visits: 1 (3 total) Session Start time: 8:15  Session End time: 8:25 Total time: 10  Referring Provider: Maye Hides, CNM Type of Visit: Video Patient/Family location: Home [facility] Provider location: [facility] [place] [facility]  All persons participating in visit: Patient Jody Robinson and [place]    Confirmed patient's address: Yes  Confirmed patient's phone number: Yes  Any changes to demographics: No   Confirmed patient's insurance: Yes  Any changes to patient's insurance: No   Discussed confidentiality: Yes   I connected with Jody Robinson by a video enabled telemedicine application ([place]) and verified that I am speaking with the correct person using two identifiers.     I discussed the limitations of evaluation and management by telemedicine and the availability of in person appointments.  I discussed that the purpose of this visit is to provide behavioral health care while limiting exposure to the novel coronavirus.   Discussed there is a possibility of technology failure and discussed alternative modes of communication if that failure occurs.  I discussed that engaging in this virtual visit, they consent to the provision of behavioral healthcare and the services will be billed under their insurance.  Patient and/or legal guardian expressed understanding and consented to virtual visit: Yes   PRESENTING CONCERNS: Patient and/or family reports the following symptoms/concerns: Pt states she feels good after giving birth to new baby girl last night, currently in the hospital, attributing symptoms of anxiety and depression to end of pregnancy; pt says she has not been able to sleep much since baby was born;  no other questions or concerns at this time.  Duration of  problem: Ongoing; Severity of problem: moderate  STRENGTHS (Protective Factors/Coping Skills): Positive outlook  GOALS ADDRESSED: Patient will: 1.  Reduce symptoms of: anxiety and depression  2.  Increase knowledge and/or ability of: healthy habits  3.  Demonstrate ability to: Increase healthy adjustment to current life circumstances  INTERVENTIONS: Interventions utilized:  Supportive Counseling and Psychoeducation and/or Health Education Standardized Assessments completed: PHQ/GAD given within past two weeks  ASSESSMENT: Patient currently experiencing Adjustment disorder with mixed anxiety and depression.   Patient may benefit from psychoeducation and brief therapeutic interventions regarding coping with symptoms of anxiety and depression .  PLAN: 1. Follow up with behavioral health clinician on : Two weeks for mood check postpartum 2. Behavioral recommendations:  -Continue taking prenatal vitamin until postpartum medical visit -Take a nap while baby sleeps this morning; continue to sleep when baby sleeps, as much as able, for remainder of hospital stay 3. Referral(s): [place] (In Clinic)  I discussed the assessment and treatment plan with the patient and/or parent/guardian. They were provided an opportunity to ask questions and all were answered. They agreed with the plan and demonstrated an understanding of the instructions.   They were advised to call back or seek an in-person evaluation if the symptoms worsen or if the condition fails to improve as anticipated.  Caroleen Hamman [facility]  Depression screen [facility] 2/9 03/01/2020 02/21/2020 02/10/2020 01/19/2020 12/14/2019  Decreased Interest 3 3 2 2 2   Down, Depressed, Hopeless 0 1 0 2 1  PHQ - 2 Score 3 4 2 4 3   Altered sleeping 3 3 3 3 3   Tired, decreased energy 3 3 3 2 2   Change in appetite 2 2 2 2 3   Feeling  bad or failure about yourself  0 0 0 0 1  Trouble concentrating 0 3 3 1 3   Moving slowly or  fidgety/restless 0 0 0 0 0  Suicidal thoughts 0 0 0 0 0  PHQ-9 Score 11 15 13 12 15   Some recent data might be hidden   GAD 7 : Generalized Anxiety Score 03/01/2020 02/21/2020 02/10/2020 01/19/2020  Nervous, Anxious, on Edge 2 0 2 2  Control/stop worrying 0 0 0 0  Worry too much - different things 0 0 0 0  Trouble relaxing 3 3 3 3   Restless 0 0 0 1  Easily annoyed or irritable 3 3 3 3   Afraid - awful might happen 0 0 0 0  Total GAD 7 Score 8 6 8  9

## 2020-03-01 ENCOUNTER — Ambulatory Visit (INDEPENDENT_AMBULATORY_CARE_PROVIDER_SITE_OTHER): Payer: 59 | Admitting: Obstetrics and Gynecology

## 2020-03-01 ENCOUNTER — Inpatient Hospital Stay (HOSPITAL_COMMUNITY): Payer: 59 | Admitting: Anesthesiology

## 2020-03-01 ENCOUNTER — Other Ambulatory Visit: Payer: Self-pay

## 2020-03-01 ENCOUNTER — Inpatient Hospital Stay (HOSPITAL_COMMUNITY)
Admission: AD | Admit: 2020-03-01 | Discharge: 2020-03-03 | DRG: 806 | Disposition: A | Discharge status: Home / Self Care | Payer: 59 | Attending: Obstetrics and Gynecology | Admitting: Obstetrics and Gynecology

## 2020-03-01 ENCOUNTER — Encounter (HOSPITAL_COMMUNITY): Payer: Self-pay | Admitting: Obstetrics and Gynecology

## 2020-03-01 ENCOUNTER — Encounter: Payer: Self-pay | Admitting: Obstetrics and Gynecology

## 2020-03-01 VITALS — BP 116/75 | HR 74 | Wt 142.6 lb

## 2020-03-01 DIAGNOSIS — D75839 Thrombocytosis, unspecified: Secondary | ICD-10-CM

## 2020-03-01 DIAGNOSIS — D473 Essential (hemorrhagic) thrombocythemia: Secondary | ICD-10-CM

## 2020-03-01 DIAGNOSIS — O99019 Anemia complicating pregnancy, unspecified trimester: Secondary | ICD-10-CM | POA: Diagnosis present

## 2020-03-01 DIAGNOSIS — O99013 Anemia complicating pregnancy, third trimester: Secondary | ICD-10-CM

## 2020-03-01 DIAGNOSIS — Z8616 Personal history of COVID-19: Secondary | ICD-10-CM | POA: Diagnosis not present

## 2020-03-01 DIAGNOSIS — O4202 Full-term premature rupture of membranes, onset of labor within 24 hours of rupture: Secondary | ICD-10-CM

## 2020-03-01 DIAGNOSIS — R7989 Other specified abnormal findings of blood chemistry: Secondary | ICD-10-CM | POA: Diagnosis present

## 2020-03-01 DIAGNOSIS — O0990 Supervision of high risk pregnancy, unspecified, unspecified trimester: Secondary | ICD-10-CM

## 2020-03-01 DIAGNOSIS — E0590 Thyrotoxicosis, unspecified without thyrotoxic crisis or storm: Secondary | ICD-10-CM | POA: Diagnosis present

## 2020-03-01 DIAGNOSIS — Z20822 Contact with and (suspected) exposure to covid-19: Secondary | ICD-10-CM | POA: Diagnosis present

## 2020-03-01 DIAGNOSIS — Z8759 Personal history of other complications of pregnancy, childbirth and the puerperium: Secondary | ICD-10-CM

## 2020-03-01 DIAGNOSIS — O0993 Supervision of high risk pregnancy, unspecified, third trimester: Secondary | ICD-10-CM

## 2020-03-01 DIAGNOSIS — Z3A39 39 weeks gestation of pregnancy: Secondary | ICD-10-CM

## 2020-03-01 DIAGNOSIS — F32A Depression, unspecified: Secondary | ICD-10-CM | POA: Diagnosis present

## 2020-03-01 DIAGNOSIS — B951 Streptococcus, group B, as the cause of diseases classified elsewhere: Secondary | ICD-10-CM | POA: Diagnosis present

## 2020-03-01 DIAGNOSIS — O99824 Streptococcus B carrier state complicating childbirth: Secondary | ICD-10-CM | POA: Diagnosis present

## 2020-03-01 DIAGNOSIS — A6000 Herpesviral infection of urogenital system, unspecified: Secondary | ICD-10-CM | POA: Diagnosis present

## 2020-03-01 DIAGNOSIS — O2662 Liver and biliary tract disorders in childbirth: Secondary | ICD-10-CM | POA: Diagnosis present

## 2020-03-01 DIAGNOSIS — O98513 Other viral diseases complicating pregnancy, third trimester: Secondary | ICD-10-CM

## 2020-03-01 DIAGNOSIS — O99820 Streptococcus B carrier state complicating pregnancy: Secondary | ICD-10-CM

## 2020-03-01 DIAGNOSIS — O26893 Other specified pregnancy related conditions, third trimester: Secondary | ICD-10-CM | POA: Diagnosis present

## 2020-03-01 DIAGNOSIS — O99891 Other specified diseases and conditions complicating pregnancy: Secondary | ICD-10-CM

## 2020-03-01 DIAGNOSIS — D649 Anemia, unspecified: Secondary | ICD-10-CM

## 2020-03-01 DIAGNOSIS — B009 Herpesviral infection, unspecified: Secondary | ICD-10-CM | POA: Diagnosis present

## 2020-03-01 DIAGNOSIS — F329 Major depressive disorder, single episode, unspecified: Secondary | ICD-10-CM | POA: Diagnosis present

## 2020-03-01 DIAGNOSIS — O9832 Other infections with a predominantly sexual mode of transmission complicating childbirth: Secondary | ICD-10-CM | POA: Diagnosis present

## 2020-03-01 HISTORY — DX: Anemia complicating pregnancy, unspecified trimester: O99.019

## 2020-03-01 LAB — CBC WITH DIFFERENTIAL/PLATELET
Abs Immature Granulocytes: 0.16 10*3/uL — ABNORMAL HIGH (ref 0.00–0.07)
Basophils Absolute: 0 10*3/uL (ref 0.0–0.1)
Basophils Relative: 0 %
Eosinophils Absolute: 0 10*3/uL (ref 0.0–0.5)
Eosinophils Relative: 0 %
HCT: 34.3 % — ABNORMAL LOW (ref 36.0–46.0)
Hemoglobin: 10.9 g/dL — ABNORMAL LOW (ref 12.0–15.0)
Immature Granulocytes: 1 %
Lymphocytes Relative: 20 %
Lymphs Abs: 2.2 10*3/uL (ref 0.7–4.0)
MCH: 26.1 pg (ref 26.0–34.0)
MCHC: 31.8 g/dL (ref 30.0–36.0)
MCV: 82.1 fL (ref 80.0–100.0)
Monocytes Absolute: 0.7 10*3/uL (ref 0.1–1.0)
Monocytes Relative: 6 %
Neutro Abs: 8.2 10*3/uL — ABNORMAL HIGH (ref 1.7–7.7)
Neutrophils Relative %: 73 %
Platelets: 394 10*3/uL (ref 150–400)
RBC: 4.18 MIL/uL (ref 3.87–5.11)
RDW: 22.3 % — ABNORMAL HIGH (ref 11.5–15.5)
WBC: 11.3 10*3/uL — ABNORMAL HIGH (ref 4.0–10.5)
nRBC: 0 % (ref 0.0–0.2)

## 2020-03-01 LAB — SARS CORONAVIRUS 2 BY RT PCR (HOSPITAL ORDER, PERFORMED IN ~~LOC~~ HOSPITAL LAB): SARS Coronavirus 2: NEGATIVE

## 2020-03-01 LAB — TYPE AND SCREEN
ABO/RH(D): O POS
Antibody Screen: NEGATIVE

## 2020-03-01 MED ORDER — PRENATAL MULTIVITAMIN CH
1.0000 | ORAL_TABLET | Freq: Every day | ORAL | Status: DC
  Administered 2020-03-02 – 2020-03-03 (×2): 1 via ORAL
  Filled 2020-03-01 (×2): qty 1

## 2020-03-01 MED ORDER — PHENYLEPHRINE 40 MCG/ML (10ML) SYRINGE FOR IV PUSH (FOR BLOOD PRESSURE SUPPORT): 80.0000 ug | PREFILLED_SYRINGE | INTRAVENOUS | Status: DC | PRN

## 2020-03-01 MED ORDER — LIDOCAINE HCL (PF) 1 % IJ SOLN: 30.0000 mL | INTRAMUSCULAR | Status: DC | PRN

## 2020-03-01 MED ORDER — TETANUS-DIPHTH-ACELL PERTUSSIS 5-2.5-18.5 LF-MCG/0.5 IM SUSP
0.5000 mL | Freq: Once | INTRAMUSCULAR | Status: AC
  Administered 2020-03-03: 0.5 mL via INTRAMUSCULAR
  Filled 2020-03-01: qty 0.5

## 2020-03-01 MED ORDER — ONDANSETRON HCL 4 MG PO TABS: 4.0000 mg | ORAL_TABLET | ORAL | Status: DC | PRN

## 2020-03-01 MED ORDER — SIMETHICONE 80 MG PO CHEW: 80.0000 mg | CHEWABLE_TABLET | ORAL | Status: DC | PRN

## 2020-03-01 MED ORDER — ACETAMINOPHEN 325 MG PO TABS
650.0000 mg | ORAL_TABLET | Freq: Four times a day (QID) | ORAL | Status: DC | PRN
  Administered 2020-03-02 – 2020-03-03 (×2): 650 mg via ORAL
  Filled 2020-03-01 (×2): qty 2

## 2020-03-01 MED ORDER — EPHEDRINE 5 MG/ML INJ: 10.0000 mg | INTRAVENOUS | Status: DC | PRN

## 2020-03-01 MED ORDER — COCONUT OIL OIL: 1.0000 "application " | TOPICAL_OIL | Status: DC | PRN

## 2020-03-01 MED ORDER — PENICILLIN G POT IN DEXTROSE 60000 UNIT/ML IV SOLN
3.0000 10*6.[IU] | INTRAVENOUS | Status: DC
  Administered 2020-03-01 (×2): 3 10*6.[IU] via INTRAVENOUS
  Filled 2020-03-01 (×2): qty 50

## 2020-03-01 MED ORDER — SODIUM CHLORIDE 0.9 % IV SOLN
5.0000 10*6.[IU] | Freq: Once | INTRAVENOUS | Status: AC
  Administered 2020-03-01: 5 10*6.[IU] via INTRAVENOUS
  Filled 2020-03-01: qty 5

## 2020-03-01 MED ORDER — DIBUCAINE (PERIANAL) 1 % EX OINT: 1.0000 "application " | TOPICAL_OINTMENT | CUTANEOUS | Status: DC | PRN

## 2020-03-01 MED ORDER — BENZOCAINE-MENTHOL 20-0.5 % EX AERO: 1.0000 "application " | INHALATION_SPRAY | CUTANEOUS | Status: DC | PRN

## 2020-03-01 MED ORDER — MEASLES, MUMPS & RUBELLA VAC IJ SOLR: 0.5000 mL | Freq: Once | INTRAMUSCULAR | Status: DC

## 2020-03-01 MED ORDER — ONDANSETRON HCL 4 MG/2ML IJ SOLN: 4.0000 mg | INTRAMUSCULAR | Status: DC | PRN

## 2020-03-01 MED ORDER — PHENYLEPHRINE 40 MCG/ML (10ML) SYRINGE FOR IV PUSH (FOR BLOOD PRESSURE SUPPORT)
80.0000 ug | PREFILLED_SYRINGE | INTRAVENOUS | Status: DC | PRN
  Filled 2020-03-01: qty 10

## 2020-03-01 MED ORDER — OXYTOCIN-SODIUM CHLORIDE 30-0.9 UT/500ML-% IV SOLN
2.5000 [IU]/h | INTRAVENOUS | Status: DC
  Administered 2020-03-01: 2.5 [IU]/h via INTRAVENOUS
  Filled 2020-03-01: qty 500

## 2020-03-01 MED ORDER — DIPHENHYDRAMINE HCL 50 MG/ML IJ SOLN: 12.5000 mg | INTRAMUSCULAR | Status: DC | PRN

## 2020-03-01 MED ORDER — SODIUM CHLORIDE (PF) 0.9 % IJ SOLN
INTRAMUSCULAR | Status: DC | PRN
  Administered 2020-03-01: 12 mL/h via EPIDURAL

## 2020-03-01 MED ORDER — ONDANSETRON HCL 4 MG/2ML IJ SOLN
4.0000 mg | Freq: Four times a day (QID) | INTRAMUSCULAR | Status: DC | PRN
  Administered 2020-03-01: 4 mg via INTRAVENOUS
  Filled 2020-03-01: qty 2

## 2020-03-01 MED ORDER — ACETAMINOPHEN 325 MG PO TABS: 650.0000 mg | ORAL_TABLET | ORAL | Status: DC | PRN

## 2020-03-01 MED ORDER — FENTANYL-BUPIVACAINE-NACL 0.5-0.125-0.9 MG/250ML-% EP SOLN
12.0000 mL/h | EPIDURAL | Status: DC | PRN
  Filled 2020-03-01: qty 250

## 2020-03-01 MED ORDER — SOD CITRATE-CITRIC ACID 500-334 MG/5ML PO SOLN: 30.0000 mL | ORAL | Status: DC | PRN

## 2020-03-01 MED ORDER — LACTATED RINGERS IV SOLN: INTRAVENOUS | Status: DC

## 2020-03-01 MED ORDER — WITCH HAZEL-GLYCERIN EX PADS: 1.0000 "application " | MEDICATED_PAD | CUTANEOUS | Status: DC | PRN

## 2020-03-01 MED ORDER — DIPHENHYDRAMINE HCL 25 MG PO CAPS: 25.0000 mg | ORAL_CAPSULE | Freq: Four times a day (QID) | ORAL | Status: DC | PRN

## 2020-03-01 MED ORDER — FENTANYL CITRATE (PF) 100 MCG/2ML IJ SOLN
100.0000 ug | INTRAMUSCULAR | Status: DC | PRN
  Administered 2020-03-01: 100 ug via INTRAVENOUS
  Filled 2020-03-01: qty 2

## 2020-03-01 MED ORDER — IBUPROFEN 600 MG PO TABS
600.0000 mg | ORAL_TABLET | Freq: Three times a day (TID) | ORAL | Status: DC | PRN
  Administered 2020-03-02 – 2020-03-03 (×4): 600 mg via ORAL
  Filled 2020-03-01 (×5): qty 1

## 2020-03-01 MED ORDER — LACTATED RINGERS IV SOLN
500.0000 mL | Freq: Once | INTRAVENOUS | Status: AC
  Administered 2020-03-01: 500 mL via INTRAVENOUS

## 2020-03-01 MED ORDER — LIDOCAINE HCL (PF) 1 % IJ SOLN
INTRAMUSCULAR | Status: DC | PRN
  Administered 2020-03-01: 5 mL via EPIDURAL
  Administered 2020-03-01: 3 mL via EPIDURAL

## 2020-03-01 MED ORDER — OXYTOCIN BOLUS FROM INFUSION
333.0000 mL | Freq: Once | INTRAVENOUS | Status: AC
  Administered 2020-03-01: 333 mL via INTRAVENOUS

## 2020-03-01 MED ORDER — LACTATED RINGERS IV SOLN: 500.0000 mL | INTRAVENOUS | Status: DC | PRN

## 2020-03-01 MED ORDER — SENNOSIDES-DOCUSATE SODIUM 8.6-50 MG PO TABS
2.0000 | ORAL_TABLET | ORAL | Status: DC
  Administered 2020-03-02 (×2): 2 via ORAL
  Filled 2020-03-01 (×2): qty 2

## 2020-03-01 NOTE — Progress Notes (Signed)
Labor Progress Note Jody Robinson is a 29 y.o. G3P1011 at [redacted]w[redacted]d presented for early labor.   S:  Comfortable without epidural. SROM @ 1600, clear moderate.  O:  BP 120/78   Pulse 95   Temp 98 F (36.7 C) (Oral)   Resp 15   Ht 5' (1.524 m)   Wt 64.4 kg   LMP 06/02/2019   BMI 27.73 kg/m    CVE: Dilation: 6 Effacement (%): 90 Cervical Position: Anterior Station: -2, -1 Presentation: Vertex Exam by:: Carter Kitten RNC   A&P: [name] is a 29 y.o. G3P1011 [redacted]w[redacted]d here for early labor.  #Labor: Expectant management. SROM @ 1600. Pitocin to be consider once GBS prophylaxis is completed.  #Pain: Patient now has requested epidural. #FWB: Category I, FHR 130 bpm, moderate variability, Accels present and absent decels. UC indifferent.  #GBS positive, PCN #Hx of HSV: patient does not want to discuss, extensive speculum exam done at clinic today by Dr. Ilda Basset- no lesions on vaginal exam at clinic, ok for vaginal delivery #Hx of Depression: stable, not on medication  #Thromocytosis: stable #Pancreatitis, acute: stable  #Gallbladder Sludge: stable  #COVID-19: In January, no precautions needed  Anticipate vaginal delivery.    Carmelina Noun, MD 4:13 PM

## 2020-03-01 NOTE — Progress Notes (Signed)
Prenatal Visit Note Date: 03/01/2020 Clinic: [facility]  Subjective:  Jody Robinson is a 29 y.o. G3P1011 at [redacted]w[redacted]d being seen today for ongoing prenatal care.  She is currently monitored for the following issues for this low-risk pregnancy and has History of IUFD; Supervision of high risk pregnancy, antepartum; Depression; Nausea/vomiting in pregnancy; Hyperemesis gravidarum; Seasonal allergies; Herpes simplex; History of abnormal cervical Pap smear; Thrombocytosis ([place]); Pancreatitis, acute; Gallbladder sludge; Low TSH level; Group B streptococcal infection during pregnancy; and Anemia in pregnancy on their problem list.  Patient reports occasional contractions.   Contractions: Irritability. Vag. Bleeding: None.  Movement: Present. Denies leaking of fluid.   The following portions of the patient's history were reviewed and updated as appropriate: allergies, current medications, past family history, past medical history, past social history, past surgical history and problem list. Problem list updated.  Objective:   Vitals:   03/01/20 0943  BP: 116/75  Pulse: 74  Weight: 142 lb 9.6 oz (64.7 kg)    Fetal Status: Fetal Heart Rate (bpm): 140 Fundal Height: 39 cm Movement: Present  Presentation: Vertex  General:  Alert, oriented and cooperative. Patient is in no acute distress.  Skin: Skin is warm and dry. No rash noted.   Cardiovascular: Normal heart rate noted  Respiratory: Normal respiratory effort, no problems with respiration noted  Abdomen: Soft, gravid, appropriate for gestational age. Pain/Pressure: Present     Pelvic:  Cervical exam performed Dilation: 4.5 Effacement (%): 50 Station: -2  Cervix, vagina: negative  Extremities: Normal range of motion.  Edema: None  Mental Status: Normal mood and affect. Normal behavior. Normal judgment and thought content.   Urinalysis:      Assessment and Plan:  Pregnancy: G3P1011 at [redacted]w[redacted]d  1. Thrombocytosis  ([place]) No current issues. F/u PP  2. Abnormal LFTs resolved  3. Group B streptococcal infection during pregnancy Start with IOL. See below  4. Low TSH level Normal on no meds in may 2021  5. Herpes simplex Denies any prodromal s/s and no lesions externally. Valtrex prescribed  6. Supervision of high risk pregnancy, antepartum Pt and L&D okay for IOL today given GBS pos, multip and 4-5cm.  Large ac and efw in the 62s in early June.   7. Anemia during pregnancy in third trimester S/p feraheme x 2.  Term labor symptoms and general obstetric precautions including but not limited to vaginal bleeding, contractions, leaking of fluid and fetal movement were reviewed in detail with the patient. Please refer to After Visit Summary for other counseling recommendations.  No follow-ups on file. Postpartum    Aletha Halim, MD

## 2020-03-01 NOTE — H&P (Addendum)
OBSTETRIC ADMISSION HISTORY AND PHYSICAL  Jody Robinson is a 29 y.o. female G74P1011 with IUP at [redacted]w[redacted]d by anatomy scan c/w LMP presenting for early labor. She reports +FMs, No LOF, no VB, no blurry vision, headaches or peripheral edema, and RUQ pain.  She plans on breast/bottle feeding. She request nexplanon for birth control.  She received her prenatal care at [place]: By LMP --->  Estimated Date of Delivery: 03/08/20  Sono:   @[redacted]w[redacted]d , CWD, normal anatomy, cephalic presentation, 42% lie, 2187g, 4lb 13 oz EFW, anterior placenta.  Prenatal History/Complications:  Hx of HSV, on Valtrex Hx of IUFD Hx of Depression Hx of abnormal cervical PAP smear  Thromocytosis Pancreatitis, acute Gallbladder Sludge COVID-19 (January) GBS Positive  Past Medical History: Past Medical History:  Diagnosis Date   Abnormal Pap smear of cervix    Depression    History of COVID-19 09/10/2019   COVID+ 09/08/19 symptomatic     Past Surgical History: Past Surgical History:  Procedure Laterality Date   COLPOSCOPY      Obstetrical History: OB History     Gravida  3   Para  1   Term  1   Preterm      AB  1   Living  1      SAB  1   TAB      Ectopic      Multiple      Live Births  1           Social History Social History   Socioeconomic History   Marital status: Single    Spouse name: Not on file   Number of children: Not on file   Years of education: Not on file   Highest education level: Not on file  Occupational History   Not on file  Tobacco Use   Smoking status: Never Smoker   Smokeless tobacco: Never Used  Vaping Use   Vaping Use: Never used  Substance and Sexual Activity   Alcohol use: Not Currently    Comment: occasional   Drug use: No   Sexual activity: Yes    Birth control/protection: None  Other Topics Concern   Not on file  Social History Narrative   Not on file   Social Determinants of Health   Financial Resource Strain:     Difficulty of Paying Living Expenses:   Food Insecurity: No Food Insecurity   Worried About Running Out of Food in the Last Year: Never true   Ran Out of Food in the Last Year: Never true  Transportation Needs: No Transportation Needs   Lack of Transportation (Medical): No   Lack of Transportation (Non-Medical): No  Physical Activity:    Days of Exercise per Week:    Minutes of Exercise per Session:   Stress:    Feeling of Stress :   Social Connections:    Frequency of Communication with Friends and Family:    Frequency of Social Gatherings with Friends and Family:    Attends Religious Services:    Active Member of Clubs or Organizations:    Attends Music therapist:    Marital Status:     Family History: Family History  Problem Relation Age of Onset   Diabetes Mother     Allergies: No Known Allergies  Medications Prior to Admission  Medication Sig Dispense Refill Last Dose   cyclobenzaprine (FLEXERIL) 5 MG tablet Take 1 tablet (5 mg total) by mouth 2 (two) times daily as needed  for muscle spasms. 10 tablet 0    Elastic Bandages & Supports (COMFORT FIT MATERNITY SUPP SM) MISC 1 Units by Does not apply route daily as needed. (Patient not taking: Reported on 11/03/2019) 1 each 0    famotidine (PEPCID) 20 MG tablet Take 1 tablet (20 mg total) by mouth 2 (two) times daily. (Patient not taking: Reported on 03/01/2020) 30 tablet 0    ferrous sulfate 325 (65 FE) MG tablet Take 1 tablet (325 mg total) by mouth every other day. (Patient not taking: Reported on 03/01/2020) 30 tablet 1    ondansetron (ZOFRAN) 4 MG tablet Take 1 tablet (4 mg total) by mouth daily as needed for nausea or vomiting. 30 tablet 2    Prenatal Vit-Fe Fumarate-FA (PRENATAL VITAMINS PO) Take by mouth.      valACYclovir (VALTREX) 500 MG tablet Take 1 tablet (500 mg total) by mouth 2 (two) times daily. 60 tablet 6      Review of Systems   All systems reviewed and negative except as stated in  HPI  Blood pressure 125/81, pulse 80, temperature 98 F (36.7 C), temperature source Oral, resp. rate 14, height 5' (1.524 m), weight 64.4 kg, last menstrual period 06/02/2019. General appearance: alert, cooperative, appears stated age and no distress Lungs: clear to auscultation bilaterally Heart: regular rate and rhythm Abdomen: soft, non-tender; bowel sounds normal Extremities: Homans sign is negative, no sign of DVT Presentation: cephalic Fetal monitoringBaseline: 140 bpm, Variability: Good {> 6 bpm), Accelerations: Reactive and Decelerations: Absent Uterine activityFrequency: Every 3-4 minutes Dilation: 5 Effacement (%): 90 Station: -2, -1 Exam by:: Carter Kitten RNC   Prenatal labs: ABO, Rh: O/Positive/-- (01/07 1335) Antibody: Negative (01/07 1335) Rubella: 2.65 (01/07 1335) RPR: Non Reactive (05/11 0920)  HBsAg: NON REACTIVE (01/09 0951)  HIV: Non Reactive (05/11 0920)  GBS: Positive/-- (07/01 1557)  1 hr Glucola 154 Genetic screening  AFP negative  Anatomy US normal  Prenatal Transfer Tool  Maternal Diabetes: No Genetic Screening: Normal Maternal Ultrasounds/Referrals: Normal Fetal Ultrasounds or other Referrals:  Referred to Materal Fetal Medicine  Maternal Substance Abuse:  No Significant Maternal Medications:  None Significant Maternal Lab Results: Group B Strep positive and Other: HSV Positive  No results found for this or any previous visit (from the past 24 hour(s)).  Patient Active Problem List   Diagnosis Date Noted   Anemia in pregnancy 03/01/2020   Normal labor 03/01/2020   Group B streptococcal infection during pregnancy 02/21/2020   Low TSH level 09/17/2019   Gallbladder sludge 08/22/2019   Pancreatitis, acute 08/20/2019   Thrombocytosis ([place]) 08/16/2019   Nausea/vomiting in pregnancy 08/04/2019   Supervision of high risk pregnancy, antepartum 07/13/2019   Depression    History of IUFD 02/06/2019   Hyperemesis gravidarum 01/15/2019   Seasonal  allergies 10/09/2017   History of abnormal cervical Pap smear 10/09/2017   Herpes simplex 01/16/2013    Assessment/Plan:  Jody Robinson is a 29 y.o. G3P1011 at [redacted]w[redacted]d here for early labor.   #Labor: Expectant management until GBS prophylaxis is complete, then consider pitocin at that time. #Pain: Per patient request #FWB: Category I #ID:  GBS Positive, PNC #MOF: Breast/bottle #MOC: Nexplanon #Hx of HSV: patient does not want to discuss, extensive speculum exam done at clinic today by Dr. Ilda Basset- no lesions on vaginal exam at clinic, ok for vaginal delivery #Hx of Depression: stable, not on medication  #Thromocytosis: stable #Pancreatitis, acute: stable  #Gallbladder Sludge: stable  #COVID-19: In January, no precautions needed  Anticipate vaginal delivery.   Carmelina Noun, MD  03/01/2020, 12:07 PM  GME ATTESTATION:  I saw and evaluated the patient. I agree with the findings and the plan of care as documented in the resident's note.  Merilyn Baba, DO OB Fellow, [place] for [place] 03/01/2020 1:48 PM

## 2020-03-01 NOTE — Anesthesia Procedure Notes (Signed)
Epidural Patient location during procedure: OB Start time: 03/01/2020 4:24 PM End time: 03/01/2020 4:32 PM  Staffing Anesthesiologist: Albertha Ghee, MD Performed: anesthesiologist   Preanesthetic Checklist Completed: patient identified, IV checked, site marked, risks and benefits discussed, monitors and equipment checked, pre-op evaluation and timeout performed  Epidural Patient position: sitting Prep: DuraPrep Patient monitoring: heart rate, cardiac monitor, continuous pulse ox and blood pressure Approach: midline Location: L2-L3 Injection technique: LOR saline  Needle:  Needle type: Tuohy  Needle gauge: 17 G Needle length: 9 cm Needle insertion depth: 5 cm Catheter type: closed end flexible Catheter size: 19 Gauge Catheter at skin depth: 11 cm Test dose: negative and Other  Assessment Events: blood not aspirated, injection not painful, no injection resistance and negative IV test  Additional Notes Informed consent obtained prior to proceeding including risk of failure, 1% risk of PDPH, risk of minor discomfort and bruising.  Discussed rare but serious complications including epidural abscess, permanent nerve injury, epidural hematoma.  Discussed alternatives to epidural analgesia and patient desires to proceed.  Timeout performed pre-procedure verifying patient name, procedure, and platelet count.  Patient tolerated procedure well. Reason for block:procedure for pain

## 2020-03-01 NOTE — Anesthesia Preprocedure Evaluation (Signed)
Anesthesia Evaluation  Patient identified by MRN, date of birth, ID band Patient awake    Reviewed: Allergy & Precautions, H&P , NPO status , Patient's Chart, lab work & pertinent test results  Airway Mallampati: II   Neck ROM: full    Dental   Pulmonary neg pulmonary ROS,    breath sounds clear to auscultation       Cardiovascular negative cardio ROS   Rhythm:regular Rate:Normal     Neuro/Psych PSYCHIATRIC DISORDERS Depression    GI/Hepatic   Endo/Other    Renal/GU      Musculoskeletal   Abdominal   Peds  Hematology  (+) Blood dyscrasia, anemia ,   Anesthesia Other Findings   Reproductive/Obstetrics (+) Pregnancy                             Anesthesia Physical Anesthesia Plan  ASA: II  Anesthesia Plan: Epidural   Post-op Pain Management:    Induction: Intravenous  PONV Risk Score and Plan: 2 and Treatment may vary due to age or medical condition  Airway Management Planned: Natural Airway  Additional Equipment:   Intra-op Plan:   Post-operative Plan:   Informed Consent: I have reviewed the patients History and Physical, chart, labs and discussed the procedure including the risks, benefits and alternatives for the proposed anesthesia with the patient or authorized representative who has indicated his/her understanding and acceptance.       Plan Discussed with: Anesthesiologist  Anesthesia Plan Comments:         Anesthesia Quick Evaluation

## 2020-03-01 NOTE — Discharge Summary (Signed)
Postpartum Discharge Summary     Patient Name: Jody Robinson DOB: 07-02-1991 MRN: [id number]  Date of admission: 03/01/2020 Delivery date:03/01/2020  Delivering provider: Genia Del  Date of discharge: 03/03/2020  Admitting diagnosis: Normal labor [O80, Z37.9] Intrauterine pregnancy: [redacted]w[redacted]d    Secondary diagnosis:  Active Problems:   History of IUFD   Depression   Herpes simplex   Thrombocytosis (HCC)   Low TSH level   Group B streptococcal infection during pregnancy   Anemia in pregnancy   Normal labor  Additional problems: None    Discharge diagnosis: Term Pregnancy Delivered                                              Post partum procedures:None Augmentation: none Complications: None  Hospital course: Onset of Labor With Vaginal Delivery      29y.o. yo G3P1011 at 342w0das admitted in Latent Labor on 03/01/2020. Patient had an uncomplicated labor course as follows: Initial SVE was 5/90/-2. Patient was expectantly managed and had SROM at 1600. Patient ultimately progressed to 10/100/+2 at [id number] uncomplicated delivery. Membrane Rupture Time/Date: 4:00 PM ,03/01/2020   Delivery Method:Vaginal, Spontaneous  Episiotomy: None  Lacerations:  None  Patient had an uncomplicated postpartum course.  She is ambulating, tolerating a regular diet, passing flatus, and urinating well. Patient is discharged home in stable condition on 03/03/20.  Newborn Data: Birth date:03/01/2020  Birth time:9:09 PM  Gender:Female  Living status:Living  Apgars:8 ,9  Weight:3810 g   Magnesium Sulfate received: No BMZ received: No Rhophylac:N/A MMR:N/A T-DaP:declined Flu: No Transfusion:No  Physical exam  Vitals:   03/02/20 0830 03/02/20 1326 03/02/20 1424 03/02/20 2155  BP: 106/79 114/85 118/82 120/84  Pulse: 60 48 63 52  Resp: _0 Temp: 98.3 F (36.8 C) 98.3 F (36.8 C)  98.1 F (36.7 C)  TempSrc:  Oral  Oral  SpO2: 100% 99% 100%   Weight:      Height:        General: alert, cooperative and no distress Lochia: appropriate Uterine Fundus: firm Incision: N/A DVT Evaluation: No evidence of DVT seen on physical exam. Labs: Lab Results  Component Value Date   WBC 11.3 (H) 03/01/2020   HGB 10.9 (L) 03/01/2020   HCT 34.3 (L) 03/01/2020   MCV 82.1 03/01/2020   PLT 394 03/01/2020   CMP Latest Ref Rng & Units 02/06/2020  Glucose 70 - 99 mg/dL 95  BUN 6 - 20 mg/dL 5(L)  Creatinine 0.44 - 1.00 mg/dL 0.83  Sodium 135 - 145 mmol/L 138  Potassium 3.5 - 5.1 mmol/L 3.8  Chloride 98 - 111 mmol/L 102  CO2 22 - 32 mmol/L 22  Calcium 8.9 - 10.3 mg/dL 8.6(L)  Total Protein 6.5 - 8.1 g/dL 6.4(L)  Total Bilirubin 0.3 - 1.2 mg/dL 1.6(H)  Alkaline Phos 38 - 126 U/L 194(H)  AST 15 - 41 U/L 23  ALT 0 - 44 U/L 12   Edinburgh Score: Edinburgh Postnatal Depression Scale Screening Tool 03/05/2019  I have been able to laugh and see the funny side of things. 3  I have looked forward with enjoyment to things. 2  I have blamed myself unnecessarily when things went wrong. 0  I have been anxious or worried for no good reason. 3  I have felt scared or panicky for  no good reason. 0  Things have been getting on top of me. 1  I have been so unhappy that I have had difficulty sleeping. 3  I have felt sad or miserable. 3  The thought of harming myself has occurred to me. 0     After visit meds:  Allergies as of 03/03/2020   No Known Allergies     Medication List    STOP taking these medications   Comfort Fit Maternity Supp Sm Misc   cyclobenzaprine 5 MG tablet Commonly known as: FLEXERIL   famotidine 20 MG tablet Commonly known as: PEPCID   ferrous sulfate 325 (65 FE) MG tablet   ondansetron 4 MG tablet Commonly known as: Zofran   valACYclovir 500 MG tablet Commonly known as: VALTREX     TAKE these medications   acetaminophen 325 MG tablet Commonly known as: Tylenol Take 2 tablets (650 mg total) by mouth every 6 (six) hours as needed (for  pain scale < 4).   ibuprofen 600 MG tablet Commonly known as: ADVIL Take 1 tablet (600 mg total) by mouth every 8 (eight) hours as needed for mild pain.        Discharge home in stable condition Infant Feeding: Bottle and Breast Infant Disposition:home with mother Discharge instruction: per After Visit Summary and Postpartum booklet. Activity: Advance as tolerated. Pelvic rest for 6 weeks.  Diet: routine diet Future Appointments: Future Appointments  Date Time Provider Department Center  03/16/2020  8:15 AM [facility]  04/06/2020  3:15 PM Emly, Jessica, CNM [facility]   Follow up Visit:   Please schedule this patient for a In person postpartum visit in 4 weeks with the following provider: Any provider. Additional Postpartum F/U:Postpartum Depression checkup and Nexplanon placement at 4-6 wk visit  Low risk pregnancy complicated by: LGA Delivery mode:  Vaginal, Spontaneous  Anticipated Birth Control:  Nexplanon outpatient as patient has private insurance   03/03/2020 Chelsea N Fair, MD   

## 2020-03-02 ENCOUNTER — Ambulatory Visit (INDEPENDENT_AMBULATORY_CARE_PROVIDER_SITE_OTHER): Payer: Self-pay | Admitting: Clinical

## 2020-03-02 DIAGNOSIS — O99345 Other mental disorders complicating the puerperium: Secondary | ICD-10-CM

## 2020-03-02 DIAGNOSIS — F4323 Adjustment disorder with mixed anxiety and depressed mood: Secondary | ICD-10-CM

## 2020-03-02 LAB — RPR: RPR Ser Ql: NONREACTIVE

## 2020-03-02 NOTE — Lactation Note (Addendum)
This note was copied from a baby's chart. Lactation Consultation Note  Patient Name: Jody Robinson HQION'G Date: 03/02/2020 Reason for consult: Follow-up assessment   P2, Baby 26 hours old.  First time breastfeeding. Mother wants to breastfeed and formula feed.  Nipples evert and compressible.  Reviewed hand expression and attempted latching baby but baby is sleepy at this time. Discussed breastfeeding basics.  Suggest mother call for assistance as needed. Feed on demand with cues.  Goal 8-12+ times per day after first 24 hrs.  Place baby STS if not cueing.  Mom made aware of O/P services, breastfeeding support groups, community resources, and our phone  for post-discharge questions.   LC returned to room and viewed latch after RN had assisted.  Intermittent swallows observed.     Maternal Data Has patient been taught Hand Expression?: Yes Does the patient have breastfeeding experience prior to this delivery?: No  Feeding Feeding Type: Breast Fed  LATCH Score                   Interventions Interventions: Breast feeding basics reviewed;Hand express  Lactation Tools Discussed/Used     Consult Status Consult Status: Follow-up Date: 03/03/20 Follow-up type: In-patient    Vivianne Master [facility] 03/02/2020, 1:22 PM

## 2020-03-02 NOTE — Progress Notes (Signed)
Jody Robinson was referred for history of depression. * Referral screened out by Clinical Social Worker because none of the following criteria appear to apply: ~ History of anxiety/depression during this pregnancy, or of post-partum depression following prior delivery. ~ Diagnosis of anxiety and/or depression within last 3 years OR * Jody Robinson's symptoms currently being treated with medication and/or therapy. Jody Robinson is currently seeing behavioral health specialist and is scheduled for a follow up appointment 03/16/2020.  Please contact the Clinical Social Worker if needs arise, by [facility] request, or if Jody Robinson scores greater than 9/yes to question 10 on Edinburgh Postpartum Depression Screen.  Abundio Miu, [place] Worker [facility] Cell#: [phone]

## 2020-03-02 NOTE — Anesthesia Postprocedure Evaluation (Signed)
Anesthesia Post Note  Patient: Jody Robinson  Procedure(s) Performed: AN AD [place]     Patient location during evaluation: Mother Baby Anesthesia Type: Epidural Level of consciousness: awake and alert Pain management: pain level controlled Vital Signs Assessment: post-procedure vital signs reviewed and stable Respiratory status: spontaneous breathing, nonlabored ventilation and respiratory function stable Cardiovascular status: stable Postop Assessment: no headache, no backache, epidural receding, no apparent nausea or vomiting, adequate PO intake, patient able to bend at knees and able to ambulate Anesthetic complications: no   No complications documented.  Last Vitals:  Vitals:   03/02/20 0020 03/02/20 0430  BP: 120/74 118/78  Pulse: (!) 59 60  Resp: 17 16  Temp: 36.7 C 36.6 C  SpO2: 100% 100%    Last Pain:  Vitals:   03/02/20 0519  TempSrc:   PainSc: 6    Pain Goal: Patients Stated Pain Goal: 1 (03/02/20 0519)                 Jabier Mutton

## 2020-03-02 NOTE — Progress Notes (Addendum)
POSTPARTUM PROGRESS NOTE  Post Partum Day 1  Subjective:  Jody Robinson is a 29 y.o. V3K1224 s/p NSVD at [redacted]w[redacted]d.  She reports she is doing well. No acute events overnight. She denies any problems with ambulating, voiding or PO intake.  Pain is well controlled.  Lochia is appropriate.  Objective: Blood pressure 118/78, pulse 60, temperature 97.8 F (36.6 C), temperature source Oral, resp. rate 16, height 5' (1.524 m), weight 64.4 kg, last menstrual period 06/02/2019, SpO2 100 %, unknown if currently breastfeeding.  Physical Exam:  General: Alert, cooperative and no distress Chest: No respiratory distress Heart: Regular rate Abdomen: Soft, nontender Uterine Fundus: Firm  DVT Evaluation: No calf swelling or tenderness Extremities: No edema Skin: Warm, dry  Recent Labs    03/01/20 1150  HGB 10.9*  HCT 34.3*    Assessment/Plan: Jody Robinson is a 29 y.o. S9P5300 s/p NSVD at [redacted]w[redacted]d   PPD#1 - Doing well. Continue routine postpartum care.   Contraception: Nexplanon  Feeding: Breast/Bottle; doing well Dispo: Plan for discharge PPD#2.   LOS: 1 day   Vilma Meckel, MD Family Medicine PGY-3 03/02/2020, 7:58 AM   I saw and evaluated the patient. I agree with the findings and the plan of care as documented in the resident's note.  Barrington Ellison, MD [facility] Family Medicine Fellow, [facility] for [employer], [place]

## 2020-03-03 MED ORDER — ACETAMINOPHEN 325 MG PO TABS: 650.0000 mg | ORAL_TABLET | Freq: Four times a day (QID) | ORAL | 0 refills | Status: DC | PRN

## 2020-03-03 MED ORDER — IBUPROFEN 600 MG PO TABS: 600.0000 mg | ORAL_TABLET | Freq: Three times a day (TID) | ORAL | 0 refills | Status: DC | PRN

## 2020-03-03 NOTE — Lactation Note (Signed)
This note was copied from a baby's chart. Lactation Consultation Note  Patient Name: Jody Robinson Today's Date: 03/03/2020   P1, Baby 21 hours old.  Mother is breastfeeding and supplementing with formula.  Mother states she is having more difficulty latching on L side than R side. Had mother prepump with manual pump and then [facility] assisted with latching. Baby latched easily.  Encouraged mother to breastfeed on demand before offering formula. Feed on demand with cues.  Goal 8-12+ times per day after first 24 hrs.  Place baby STS if not cueing.  Reviewed engorgement care and monitoring voids/stools.      Maternal Data    Feeding Feeding Type: Breast Fed  LATCH Score                   Interventions    Lactation Tools Discussed/Used     Consult Status      Carlye Grippe 03/03/2020, 7:55 AM

## 2020-03-03 NOTE — Discharge Instructions (Signed)

## 2020-03-08 ENCOUNTER — Encounter: Payer: Self-pay | Admitting: Obstetrics and Gynecology

## 2020-03-09 NOTE — BH Specialist Note (Deleted)
Pt did not arrive to video visit and did not answer the phone ; Voicemail was not set up, so unable to leave voice message; left MyChart message for patient. ***

## 2020-03-16 ENCOUNTER — Encounter: Payer: Self-pay | Admitting: Clinical

## 2020-04-06 ENCOUNTER — Ambulatory Visit (INDEPENDENT_AMBULATORY_CARE_PROVIDER_SITE_OTHER): Payer: 59

## 2020-04-06 ENCOUNTER — Other Ambulatory Visit: Payer: Self-pay

## 2020-04-06 DIAGNOSIS — Z30017 Encounter for initial prescription of implantable subdermal contraceptive: Secondary | ICD-10-CM

## 2020-04-06 MED ORDER — ETONOGESTREL 68 MG ~~LOC~~ IMPL
68.0000 mg | DRUG_IMPLANT | Freq: Once | SUBCUTANEOUS | Status: AC
  Administered 2020-04-06: 68 mg via SUBCUTANEOUS

## 2020-04-06 NOTE — Progress Notes (Signed)
.     [place] Partum Visit Note  Jody Robinson is a 29 y.o. G25P2012 female who presents for a postpartum visit. She is 4 weeks postpartum following a normal spontaneous vaginal delivery.  I have fully reviewed the prenatal and intrapartum course. The delivery was at 38 gestational weeks.  Anesthesia: epidural. Postpartum course has been uncomplicated. Baby is doing well. Baby is feeding by both breast and bottle - Gerber Probiotics. Bleeding no bleeding. Bowel function is normal. Bladder function is normal. Patient is not sexually active. Contraception method is Nexplanon. Postpartum depression screening: negative.   The pregnancy intention screening data noted above was reviewed. Potential methods of contraception were discussed. The patient elected to proceed with Hormonal Implant.    The following portions of the patient's history were reviewed and updated as appropriate: allergies, current medications, past family history, past medical history, past social history, past surgical history and problem list.  Review of Systems Pertinent items noted in HPI and remainder of comprehensive ROS otherwise negative.    Objective:  Last menstrual period 06/02/2019, unknown if currently breastfeeding.  General:  alert, cooperative and no distress   Breasts:  Not examined formally.  Appears soft.   Lungs: clear to auscultation bilaterally  Heart:  regular rate and rhythm  Abdomen: soft, non-tender; bowel sounds normal; no masses,  no organomegaly   Vulva:  not evaluated  Vagina: not evaluated  Cervix:  Not examined  Corpus: not examined  Adnexa:  not evaluated  Rectal Exam: Not performed.        Assessment:   4 postpartum exam  Pap smear UTD Breastfeeding Desires Nexplanon  Plan:   -Reviewed return to normal activities including sexual activity and recommendation for lubrication while breastfeeding. -Discussed risks and benefits of Nexplanon.  Patient has had in past. -Instructed to  continue PNV as long as breastfeeding.  Essential components of care per ACOG recommendations:  1.  Mood and well being: Patient with negative depression screening today. Reviewed local resources for support.  - Patient does not use tobacco. - hx of drug use? No    2. Infant care and feeding:  -Patient currently breastmilk feeding? Yes Reviewed importance of draining breast regularly to support lactation. -Social determinants of health (SDOH) reviewed in EPIC. No concerns  3. Sexuality, contraception and birth spacing - Patient does not want a pregnancy in the next year.  Desired family size is undecided of how many children.  - Reviewed forms of contraception in tiered fashion. Patient desired Nexplanon today.   - Discussed birth spacing of 18 months  4. Sleep and fatigue -Encouraged family/partner/community support of 4 hrs of uninterrupted sleep to help with mood and fatigue  5. Physical Recovery  - Discussed patients delivery and complications - Patient had no lacerations. - Patient has urinary incontinence? No - Patient is safe to resume physical and sexual activity  6.  Health Maintenance - Last pap smear done July 2020 and was normal with negative HPV. No Mammogram  7. No Chronic Disease - PCP follow up  Maryann Conners, [place] for [place]

## 2020-04-06 NOTE — Patient Instructions (Signed)
Nexplanon Instructions After Insertion  Keep bandage clean and dry for 24 hours  May use ice/Tylenol/Ibuprofen for soreness or pain  If you develop fever, drainage or increased warmth from incision site-contact office immediately   

## 2020-04-06 NOTE — Progress Notes (Signed)
  Nexplanon Insertion Procedure:  Jody Robinson requests insertion of Nexplanon for contraception method.  She understands the risks associated with insertion including pain, bleeding, infection, and paresthesias of the arm.  Patient also understands that Nexplanon can cause change in bleeding.  However, patient accepts and understands all these risks and desires to proceed.  Nexplanon inserted as below.  Informed consent signed.   Appropriate time out taken.  Patient's non-dominant left arm was identified prepped and draped in the usual sterile fashion. The previous insertion site was identified ~10 cm from epicondyle and 3cm posterior to the sulcus between the biceps and tricep muscles. The area was prepped with alcohol swab and then injected with 2.83mL of 1% lidocaine along the anticipated insertion route.  The area was then prepped with betadine and allowed 60 seconds before being wiped away with sterile gauze. Nexplanon was removed from packaging and device was confirmed within needle by provider visualization.   The device was then inserted per manufacturers instruction without complications.  The device was then palpated in the patient's arm by patient and provider. The insertion area was hemostatic with pressure and covered with steri strip and band-aid.  The arm was then wrapped with pressure dressing.  The patient tolerated the procedure well and was given post procedure instructions to include: *Remove of pressure dressing and band-aid in 12-24 hours. *Remove of steri-strips after no more than 7 days. *Condom usage or abstinence for the next 7 days. *Tylenol or ibuprofen for insertion pain/discomfort. *Call for any other questions, concerns, or complications.   Maryann Conners MSN, CNM 04/06/2020

## 2020-04-20 ENCOUNTER — Encounter: Payer: Self-pay | Admitting: General Practice

## 2020-08-09 IMAGING — US US OB < 14 WEEKS - US OB TV
1 series · 15 of 21 positions shown · non-contrast
Comparison: None.

CLINICAL DATA: Pelvic pain and cramping

EXAM:
OBSTETRIC <14 WK US AND TRANSVAGINAL OB US
TECHNIQUE: Both transabdominal and transvaginal ultrasound examinations were
performed for complete evaluation of the gestation as well as the
maternal uterus, adnexal regions, and pelvic cul-de-sac.
Transvaginal technique was performed to assess early pregnancy.

[Series 1: us ob < 14 weeks - us ob tv · 21 acquisitions, 15 frames shown]
[im 1/21]
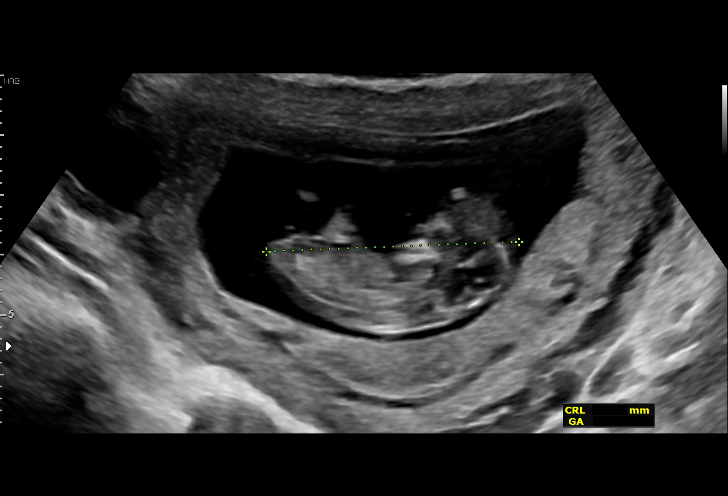
[im 3/21]
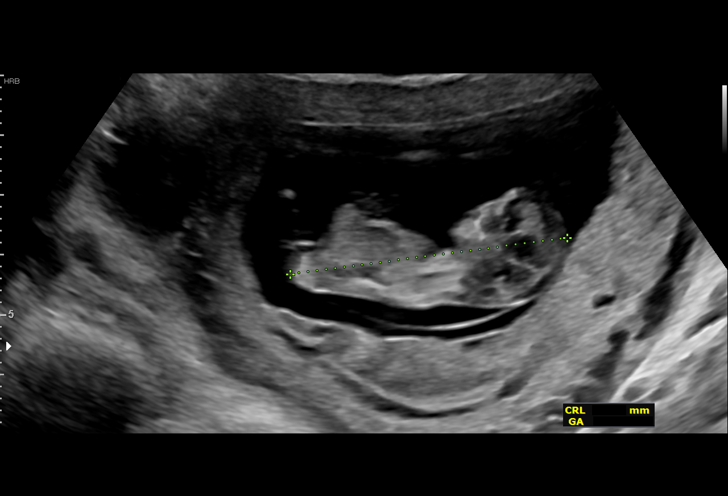
[im 4/21]
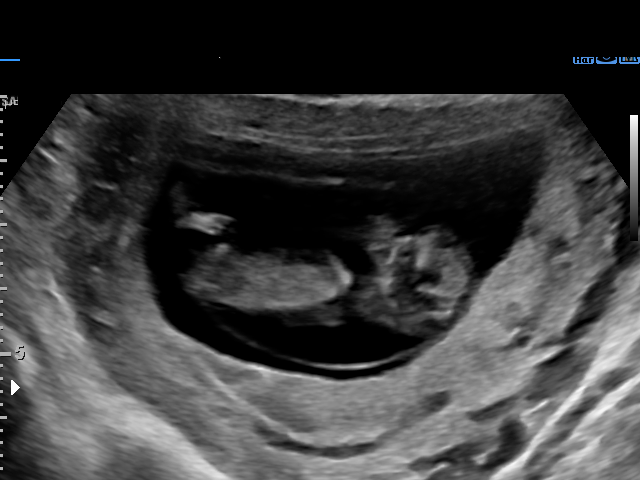
[im 5/21]
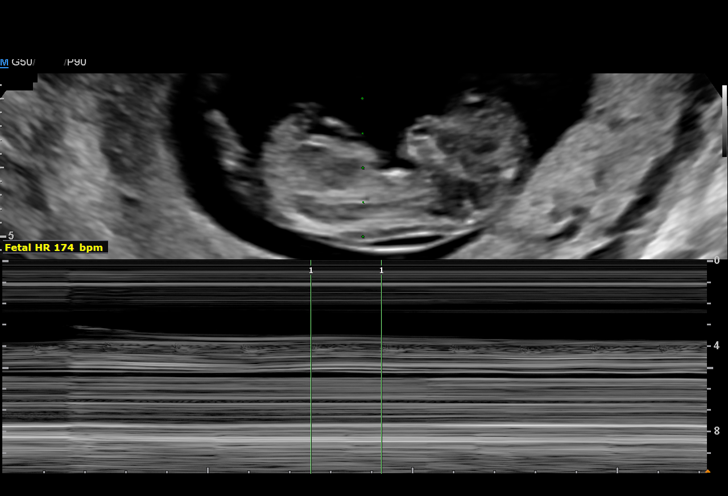
[im 7/21]
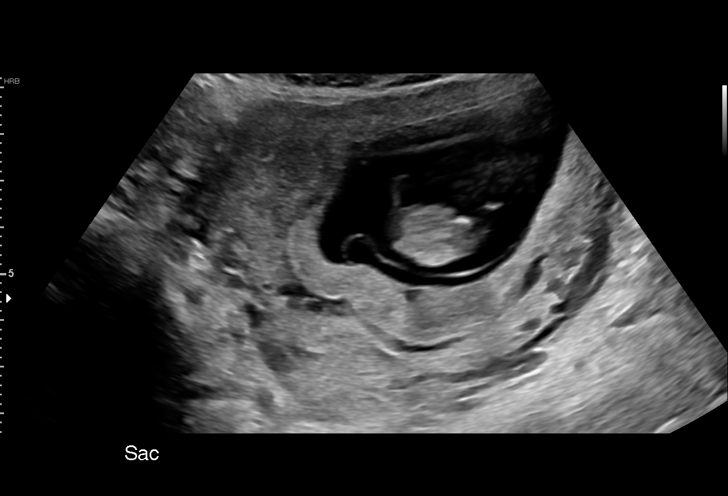
[im 8/21]
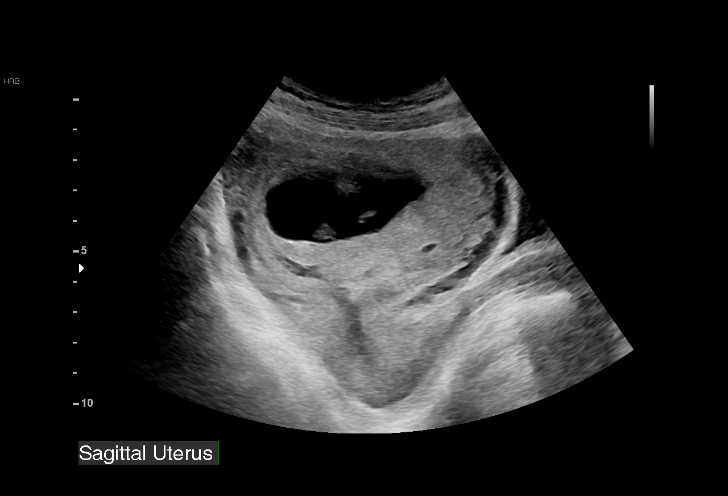
[im 10/21]
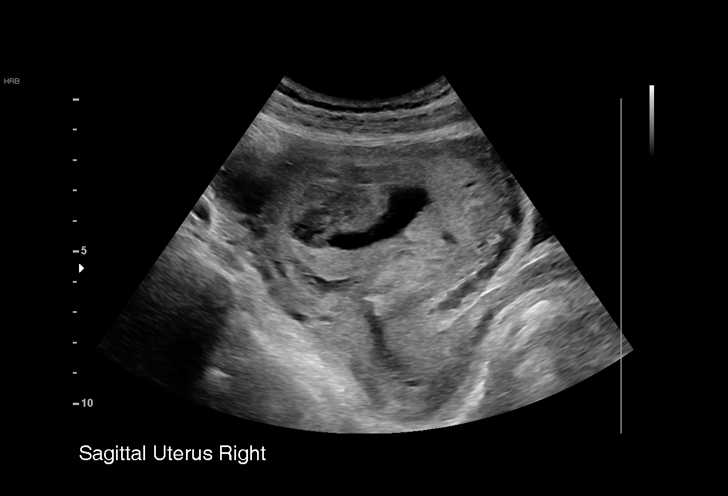
[im 11/21]
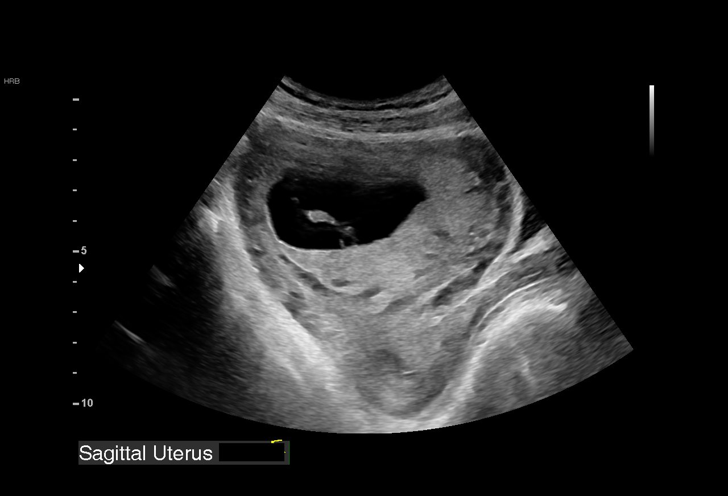
[im 12/21]
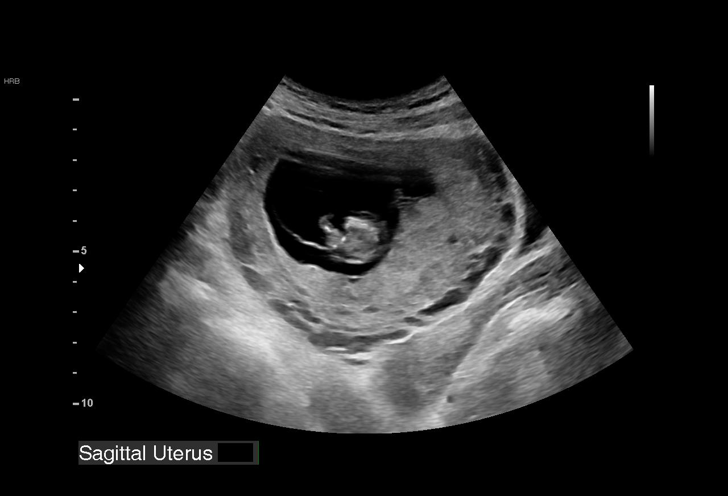
[im 14/21]
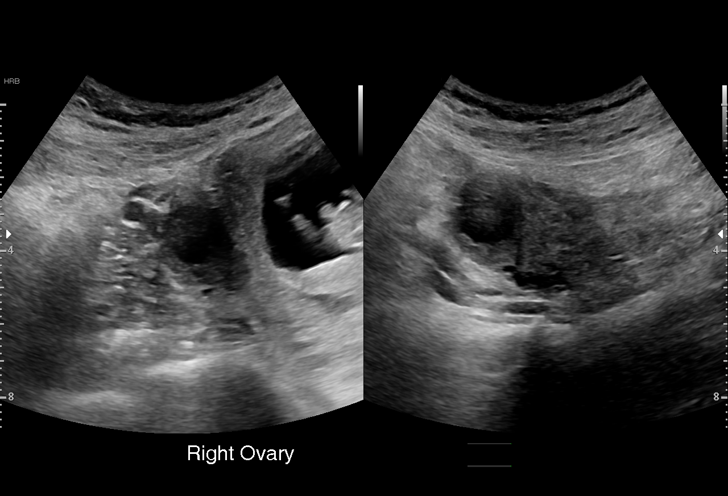
[im 15/21]
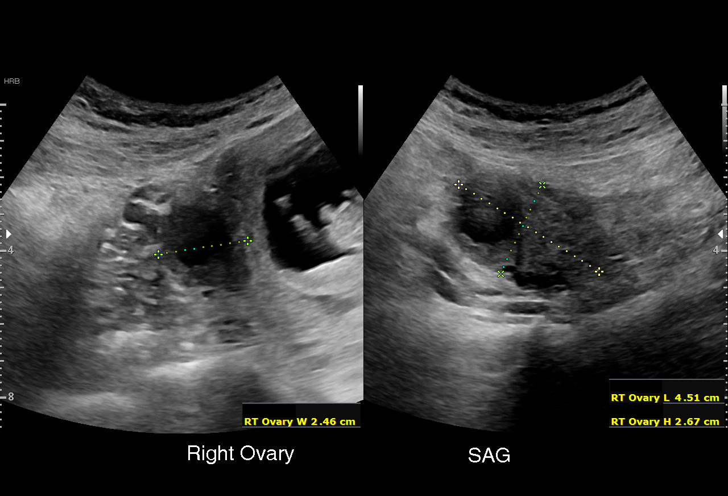
[im 17/21]
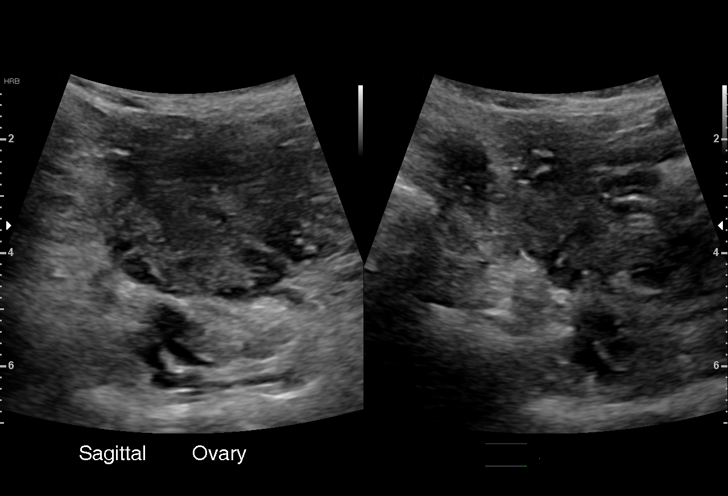
[im 18/21]
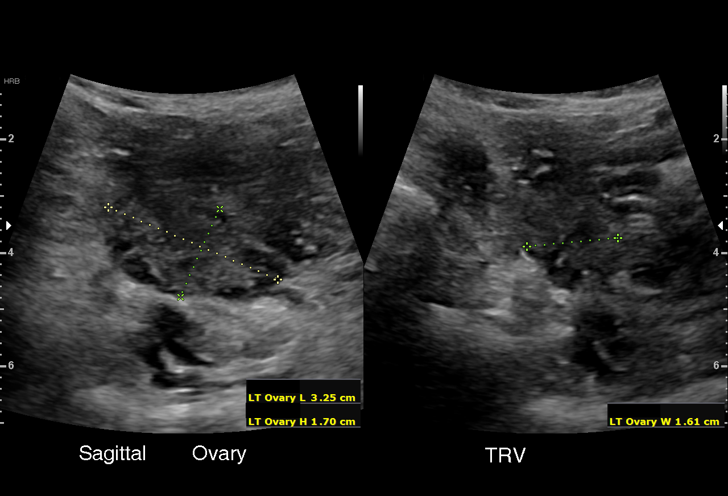
[im 19/21]
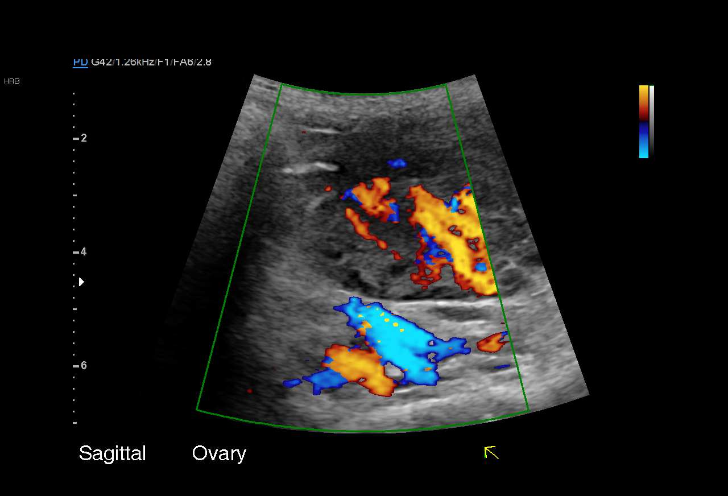
[im 21/21]
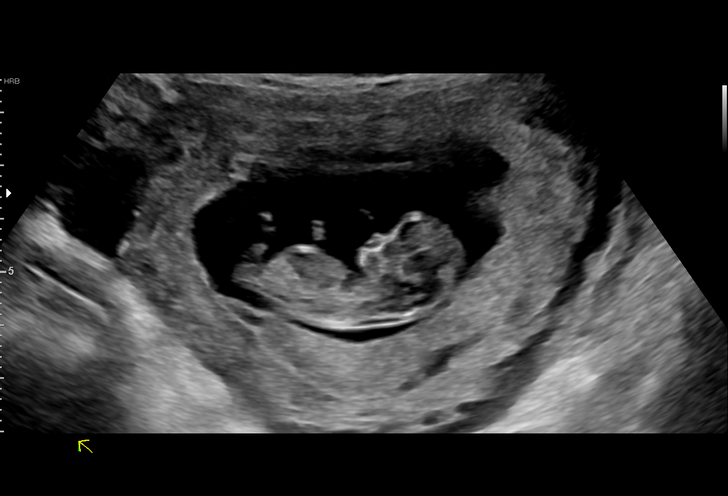

[15 of 21 positions shown; findings below may reference images not displayed]

FINDINGS: Intrauterine gestational sac: Present

Yolk sac:  Present

Embryo:  Present

Cardiac Activity: Present

Heart Rate: 174 bpm

CRL:  43.5 mm   11 w   1 d                  US EDC: 03/05/2020

Subchorionic hemorrhage:  None visualized.

Maternal uterus/adnexae: Within normal limits.
IMPRESSION: Single live intrauterine gestation at 11 weeks 1 day. Follow-up can
be performed as clinically indicated.

## 2020-08-13 IMAGING — US US ABDOMEN COMPLETE
1 series · 15 of 25 positions shown · non-contrast
Comparison: Right upper quadrant ultrasound dated 01/25/2019.

CLINICAL DATA: 28-year-old pregnant female with abdominal pain and
vomiting.

EXAM:
ABDOMEN ULTRASOUND COMPLETE

[Series 1: us abdomen complete · 15 of 90 slices shown]
[im 1/90]
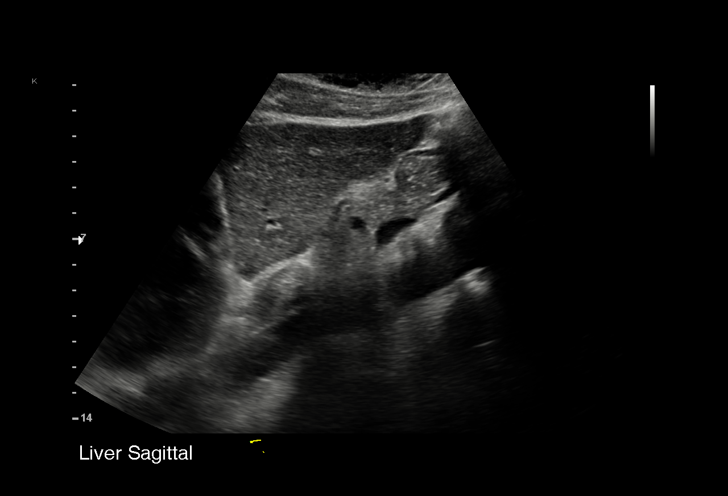
[im 8/90]
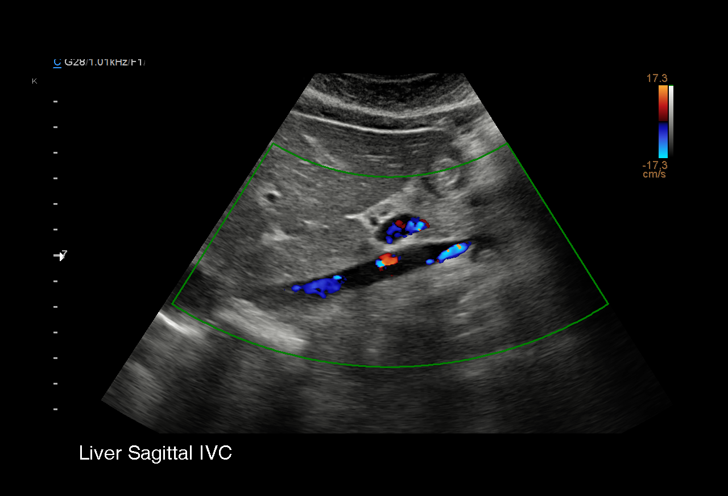
[im 15/90]
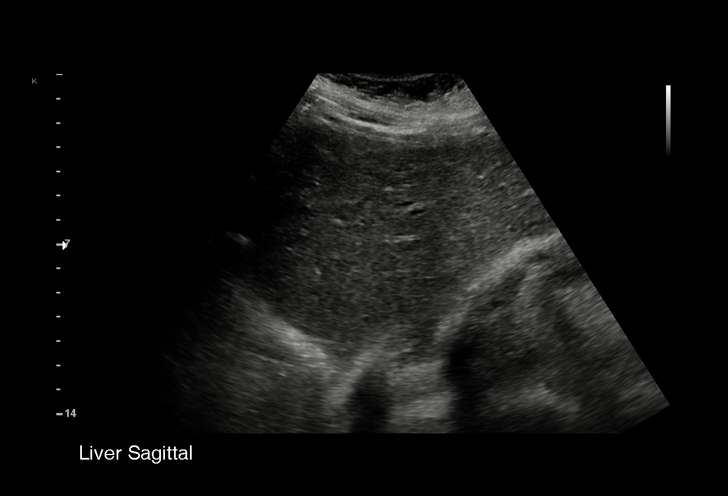
[im 19/90]
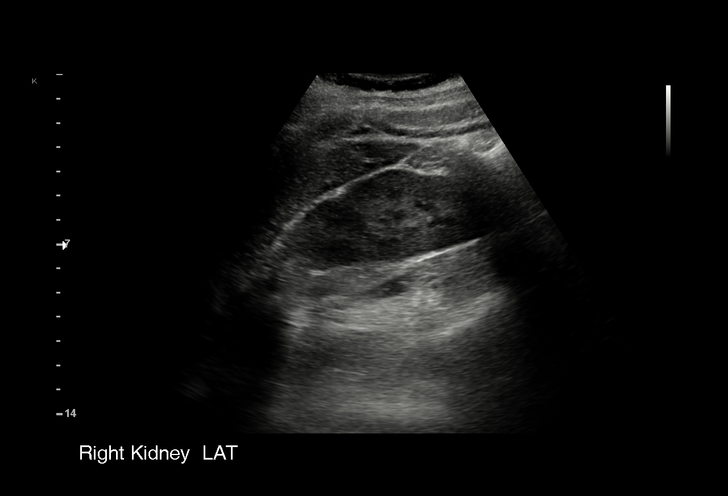
[im 26/90]
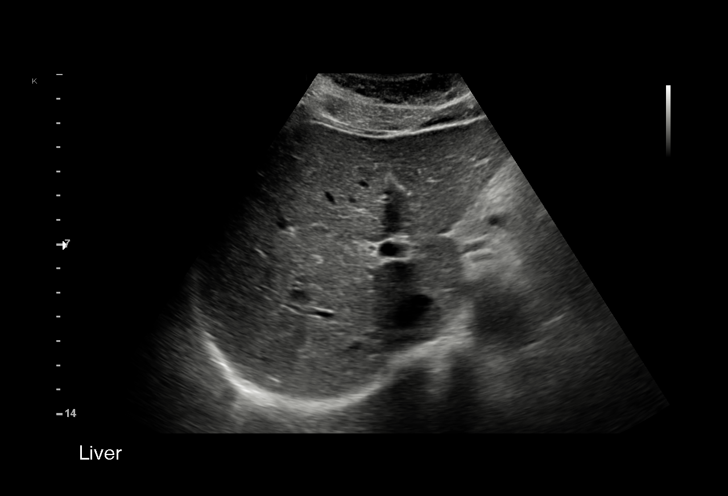
[im 34/90]
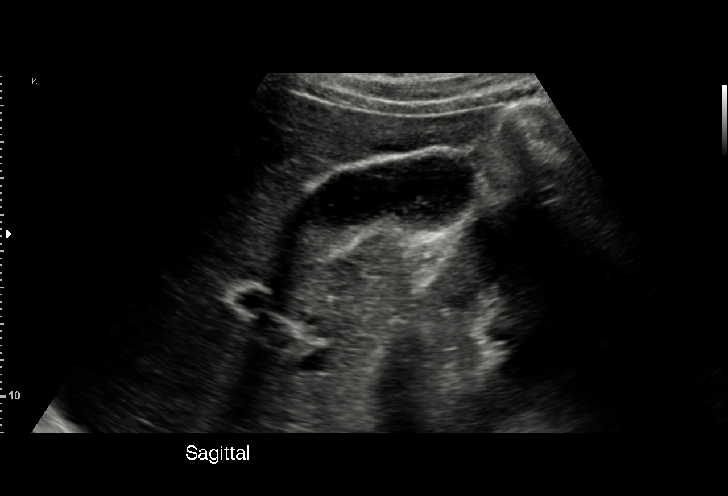
[im 38/90]
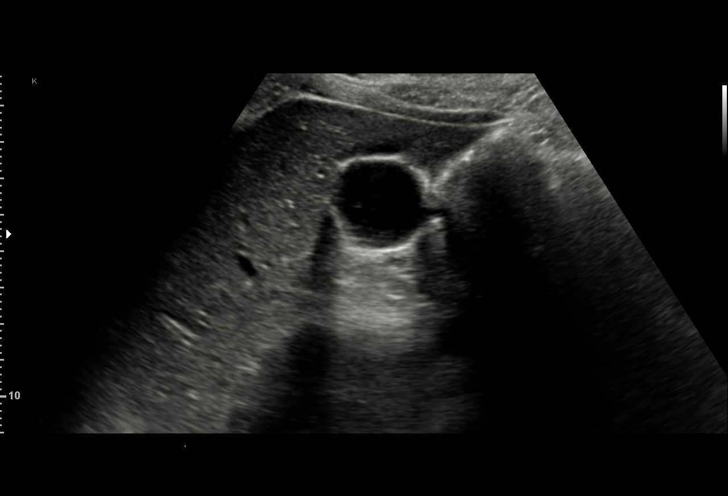
[im 45/90]
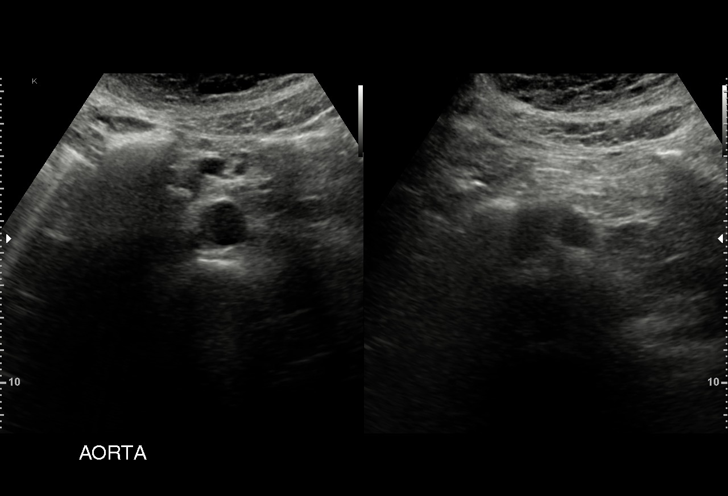
[im 52/90]
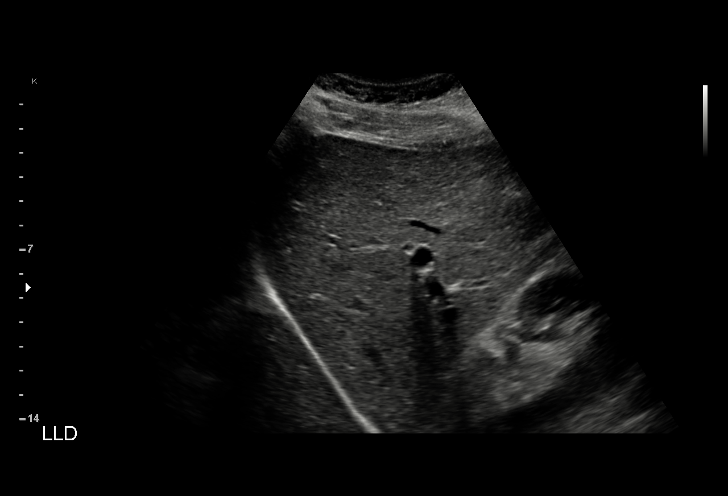
[im 56/90]
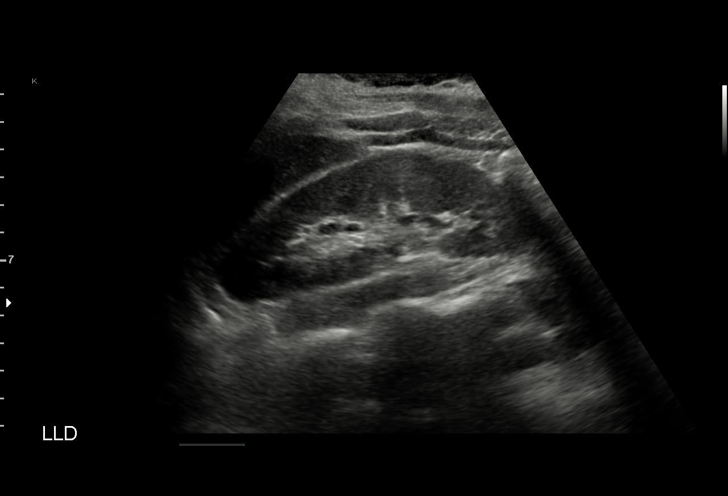
[im 64/90]
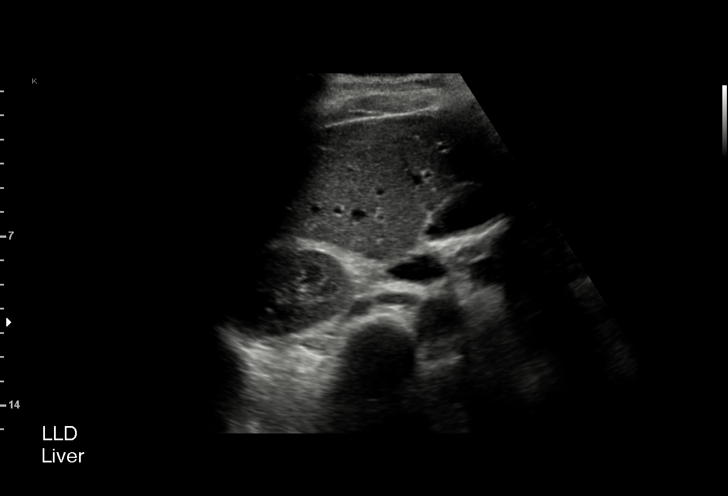
[im 71/90]
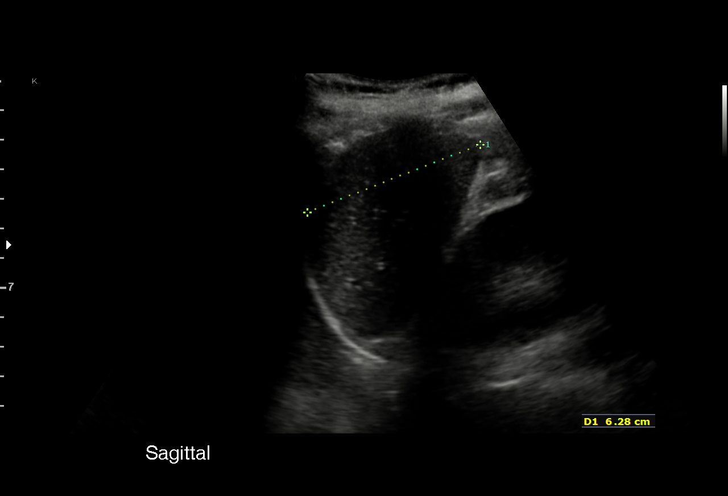
[im 75/90]
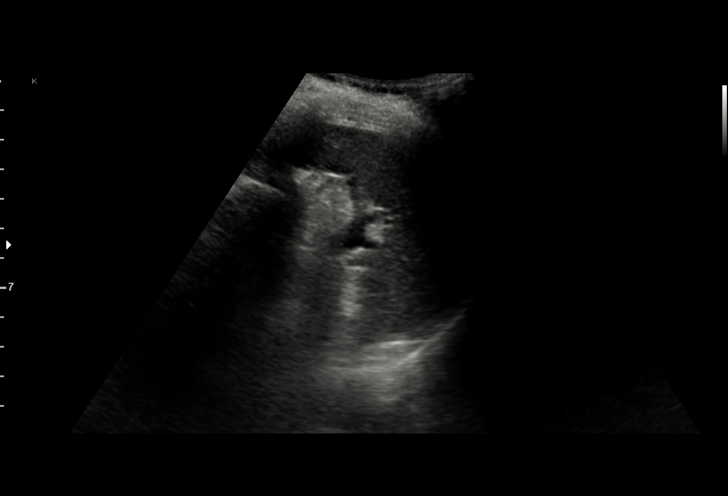
[im 82/90]
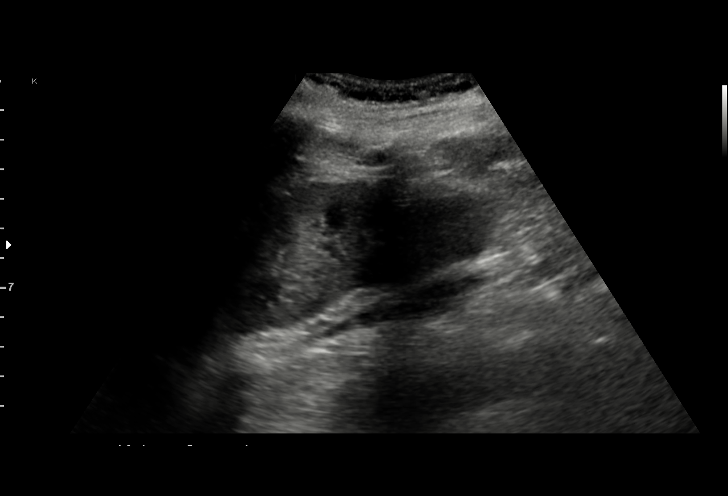
[im 90/90]
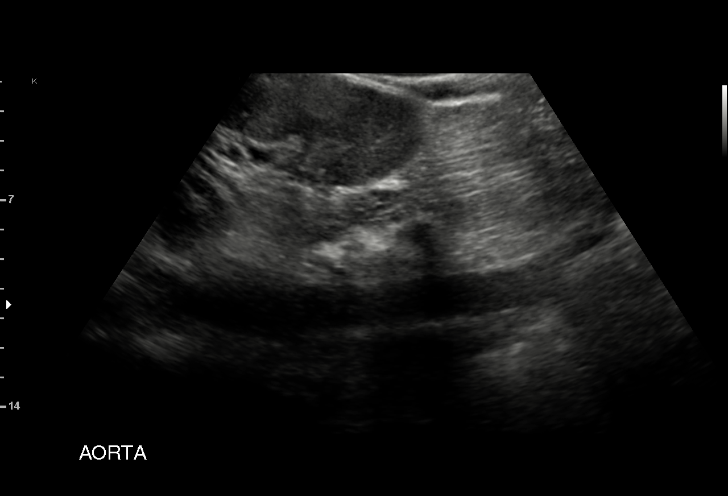

[15 of 25 positions shown; findings below may reference images not displayed]

FINDINGS: Gallbladder: There is small amount of sludge within the gallbladder.
No gallbladder wall thickening or pericholecystic fluid. Negative
sonographic Murphy's sign.

Common bile duct: Diameter: 4 mm

Liver: The liver is unremarkable as visualized. Portal vein is
patent on color Doppler imaging with normal direction of blood flow
towards the liver.

IVC: No abnormality visualized.

Pancreas: Visualized portion unremarkable.

Spleen: Size and appearance within normal limits.

Right Kidney: Length: 12 cm. Normal echogenicity. No hydronephrosis
or shadowing stone.

Left Kidney: Length: 10 cm. Normal echogenicity. No hydronephrosis
or shadowing stone.

Abdominal aorta: No aneurysm visualized.

Other findings: None.
IMPRESSION: Small gallbladder sludge, otherwise unremarkable abdominal
ultrasound.

## 2020-11-03 IMAGING — US US MFM OB FOLLOW-UP
1 series · 14 of 28 positions shown · non-contrast
Comparison: none

[Series 1: us mfm ob follow-up · 57 acquisitions, 14 frames shown]
[im 3/57]
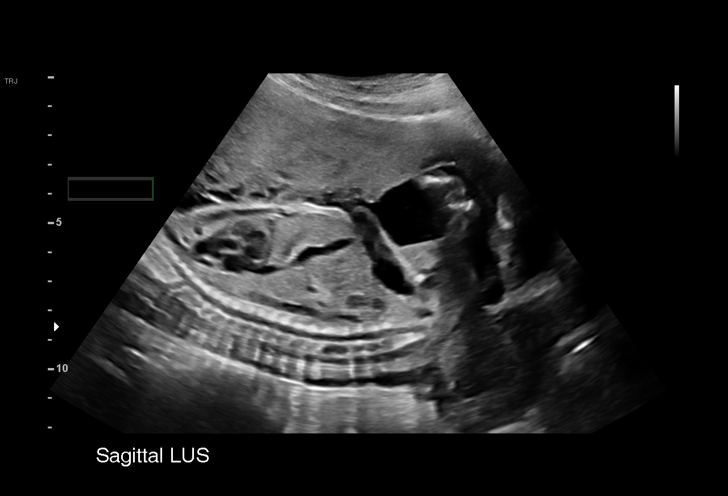
[im 7/57]
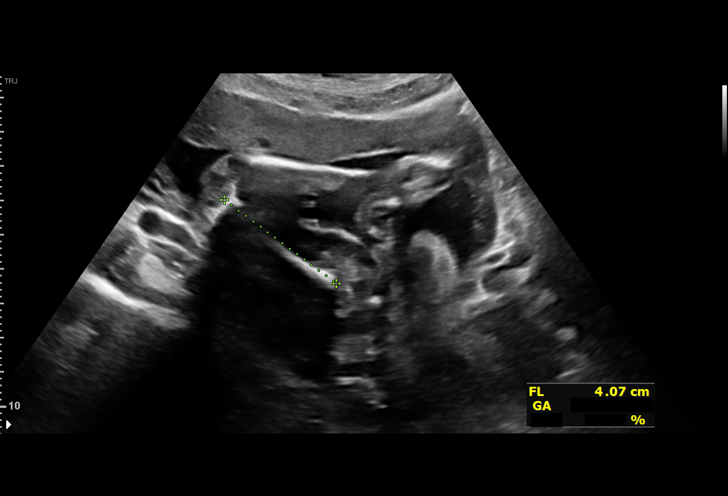
[im 11/57]
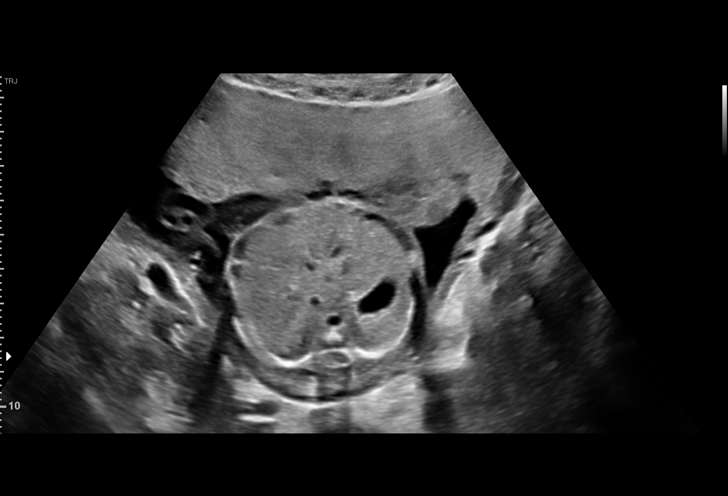
[im 15/57]
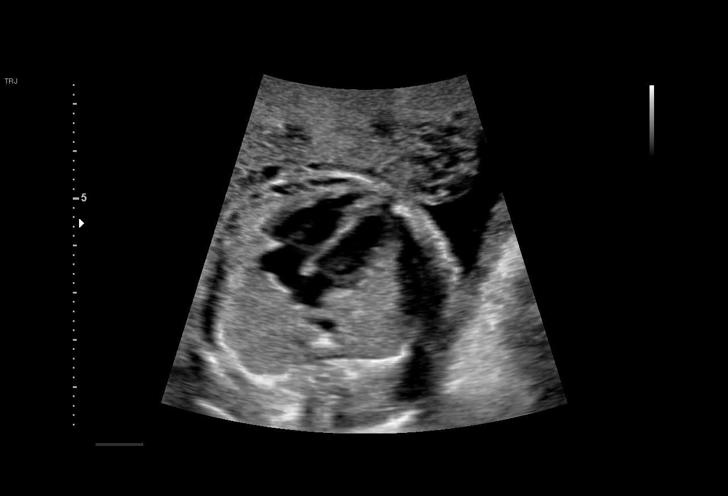
[im 19/57]
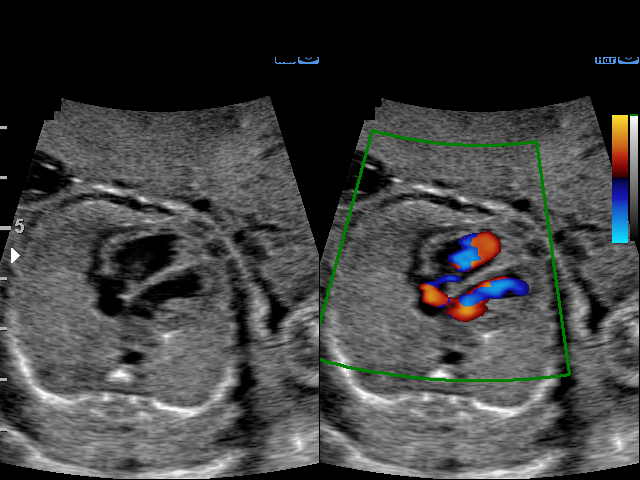
[im 23/57]
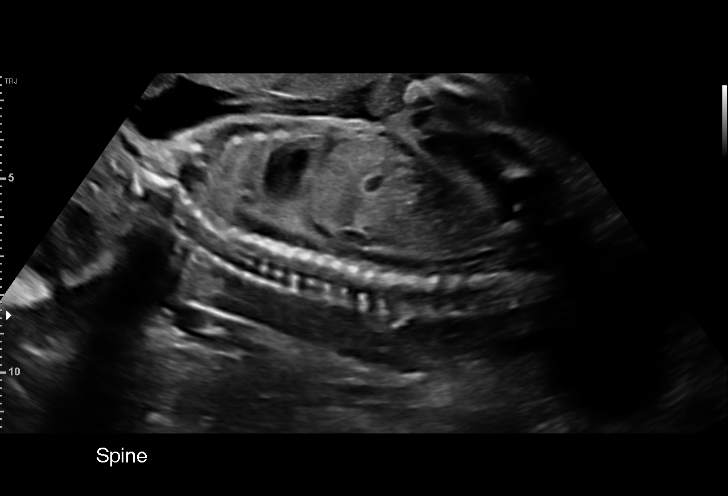
[im 27/57]
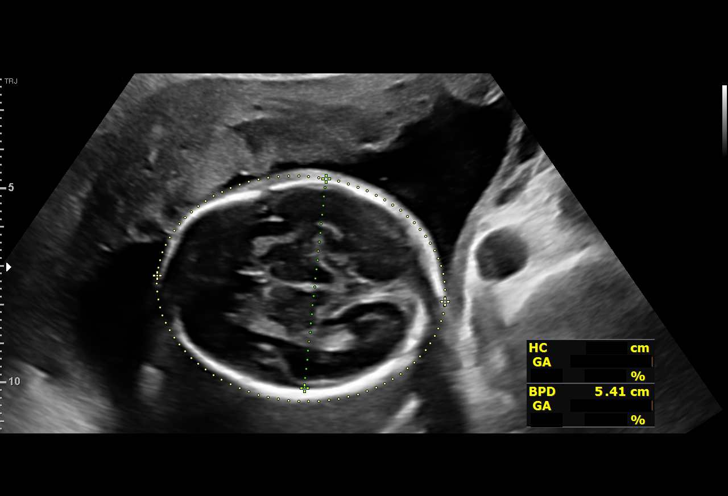
[im 32/57]
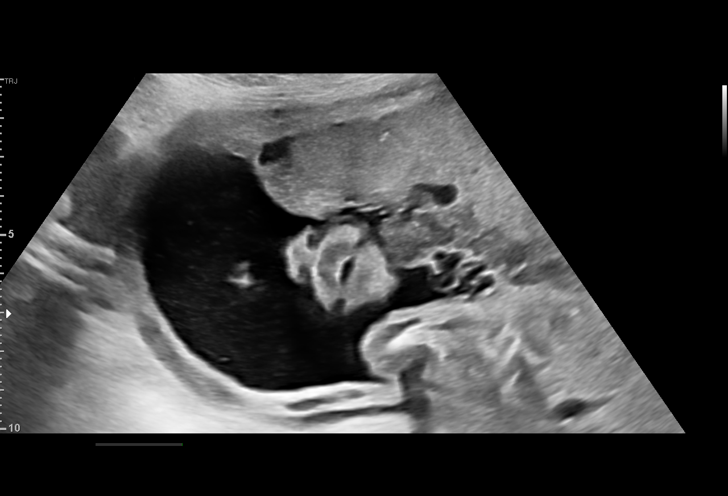
[im 36/57]
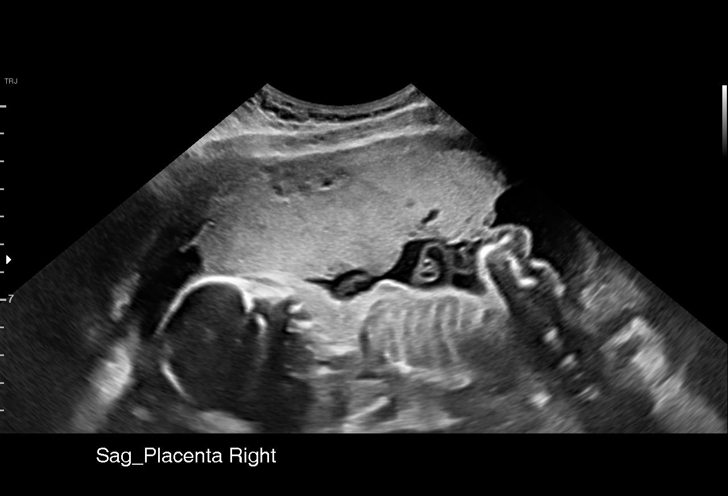
[im 40/57]
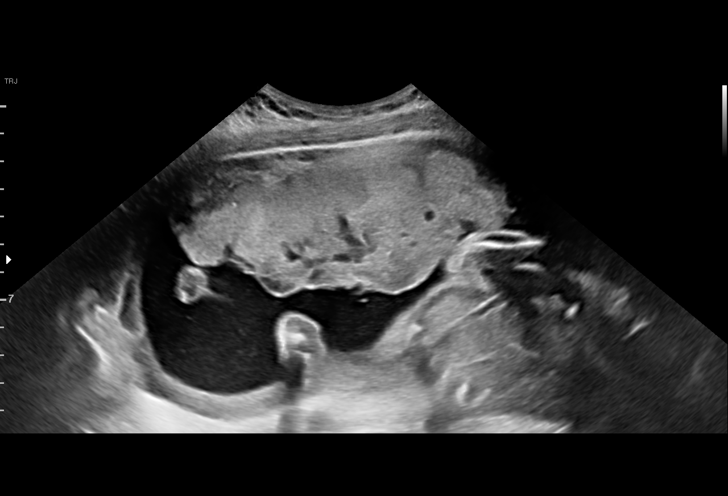
[im 44/57]
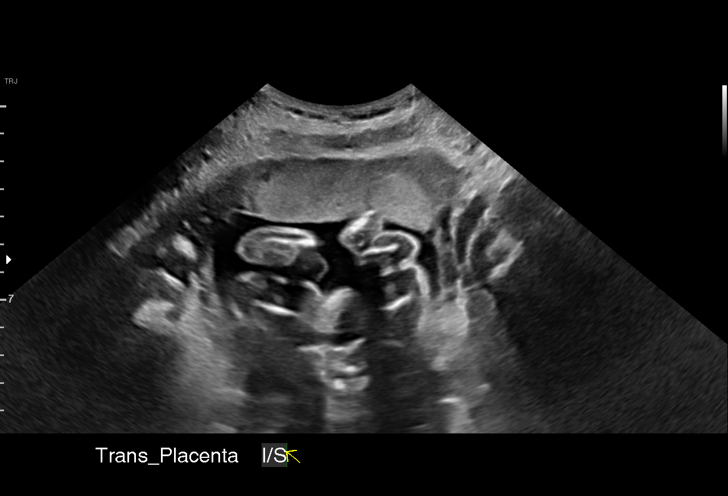
[im 48/57]
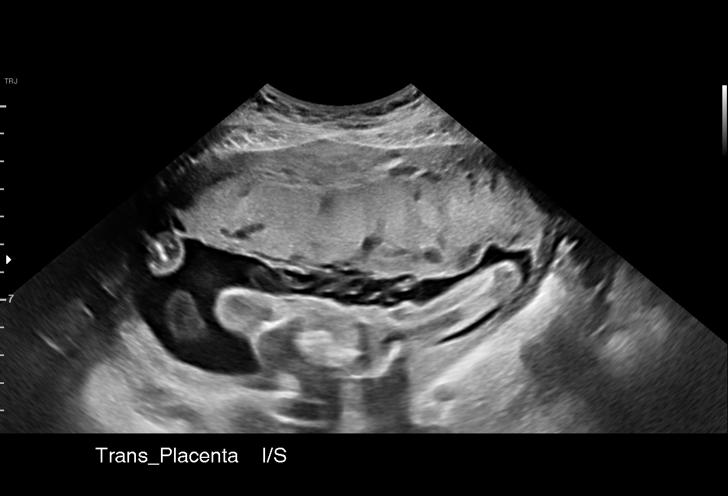
[im 52/57]
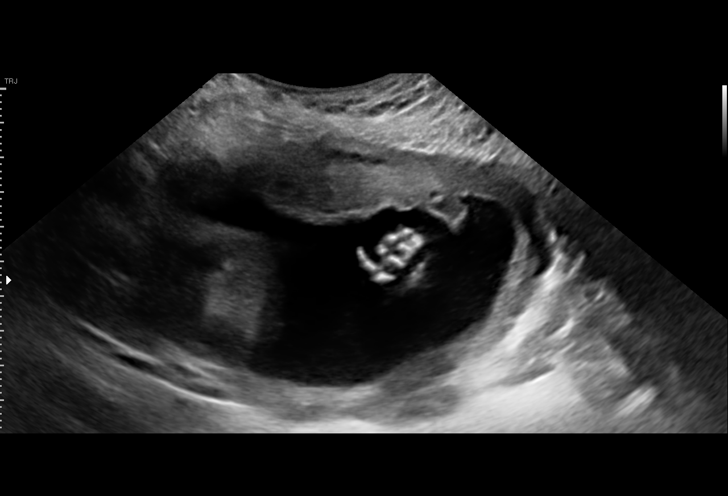
[im 57/57]
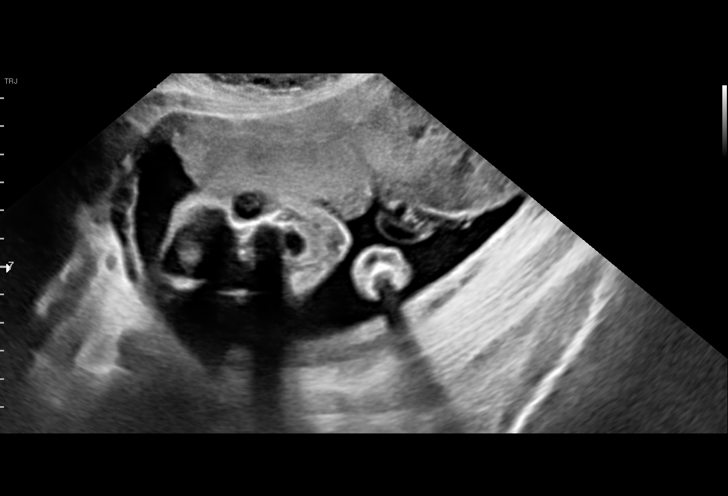

[14 of 28 positions shown; findings below may reference images not displayed]

[facility] [HOSP[facility]]
                   ROGEL CNM

                                                       ADONYS
 ----------------------------------------------------------------------

 ----------------------------------------------------------------------
Indications

  Poor obstetric history: Previous IUFD (@ 15
  weeks)
  Antenatal follow-up for nonvisualized fetal
  anatomy
  23 weeks gestation of pregnancy
 ----------------------------------------------------------------------
Fetal Evaluation

 Num Of Fetuses:         1
 Fetal Heart Rate(bpm):  158
 Cardiac Activity:       Observed
 Presentation:           Breech
 Placenta:               Anterior
 P. Cord Insertion:      Previously Visualized

 Amniotic Fluid
 AFI FV:      Within normal limits

                             Largest Pocket(cm)

Biometry

 BPD:      53.6  mm     G. Age:  22w 2d         20  %    CI:        70.99   %    70 - 86
                                                         FL/HC:      19.7   %    19.2 -
 HC:      202.7  mm     G. Age:  22w 3d         15  %    HC/AC:      1.08        1.05 -
 AC:      187.9  mm     G. Age:  23w 4d         60  %    FL/BPD:     74.4   %    71 - 87
 FL:       39.9  mm     G. Age:  22w 6d         34  %    FL/AC:      21.2   %    20 - 24

 Est. FW:     565  gm      1 lb 4 oz     49  %
OB History

 Gravidity:    3         Term:   1         SAB:   1
 Living:       1
Gestational Age

 LMP:           23w 0d        Date:  06/02/19                 EDD:   03/08/20
 U/S Today:     22w 6d                                        EDD:   03/09/20
 Best:          23w 0d     Det. By:  LMP  (06/02/19)          EDD:   03/08/20
Anatomy

 Cranium:               Appears normal         Aortic Arch:            Previously seen
 Cavum:                 Appears normal         Ductal Arch:            Previously seen
 Ventricles:            Appears normal         Diaphragm:              Previously seen
 Choroid Plexus:        Appears normal         Stomach:                Appears normal, left
                                                                       sided
 Cerebellum:            Appears normal         Abdomen:                Previously seen
 Posterior Fossa:       Appears normal         Abdominal Wall:         Previously seen
 Nuchal Fold:           Not applicable (>20    Cord Vessels:           Previously seen
                        wks GA)
 Face:                  Orbits and profile     Kidneys:                Appear normal
                        previously seen
 Lips:                  Appears normal         Bladder:                Appears normal
 Palate:                Appears normal         Spine:                  Ltd views no
                                                                       intracranial signs of
                                                                       NTD
 Heart:                 Appears normal         Upper Extremities:      Previously seen
                        (4CH, axis, and
                        situs)
 RVOT:                  Appears normal         Lower Extremities:      Previously seen
 LVOT:                  Previously seen
Cervix Uterus Adnexa

 Cervix
 Normal appearance by transabdominal scan.
Impression

 Patient returned for completion of fetal anatomy. Amniotic
 fluid is normal and good fetal activity is seen. Fetal biometry
 is consistent with her previously-established dates. Fetal
 anatomical survey was completed and appears normal.
 History of IUFD at 15 weeks.
Recommendations

 Fetal growth assessment at 32 weeks.
                 [place], [place]

## 2021-06-07 ENCOUNTER — Ambulatory Visit (INDEPENDENT_AMBULATORY_CARE_PROVIDER_SITE_OTHER): Payer: Medicaid Other | Admitting: General Practice

## 2021-06-07 ENCOUNTER — Other Ambulatory Visit: Payer: Self-pay

## 2021-06-07 ENCOUNTER — Other Ambulatory Visit (HOSPITAL_COMMUNITY)
Admission: RE | Admit: 2021-06-07 | Discharge: 2021-06-07 | Disposition: A | Discharge status: Home / Self Care | Payer: 59 | Source: Ambulatory Visit | Attending: Family Medicine | Admitting: Family Medicine

## 2021-06-07 VITALS — BP 122/77 | HR 86 | Ht 60.0 in | Wt 173.0 lb

## 2021-06-07 DIAGNOSIS — N898 Other specified noninflammatory disorders of vagina: Secondary | ICD-10-CM

## 2021-06-07 DIAGNOSIS — B851 Pediculosis due to Pediculus humanus corporis: Secondary | ICD-10-CM | POA: Diagnosis not present

## 2021-06-07 NOTE — Progress Notes (Signed)
Patient reports discharge with stronger odor for the past few days. Denies itching or irritation. Patient would also like STD testing. Patient was instructed in self swab & specimen collected. Advised results will be back in 24-48 hours and available via mychart. Told patient we will then contact you with any abnormal results and medication will be sent to pharmacy. Patient verbalized understanding.  Koren Bound RN BSN 06/07/21

## 2021-06-08 LAB — CERVICOVAGINAL ANCILLARY ONLY
Bacterial Vaginitis (gardnerella): POSITIVE — AB
Candida Glabrata: NEGATIVE
Candida Vaginitis: NEGATIVE
Chlamydia: NEGATIVE
Comment: NEGATIVE
Comment: NEGATIVE
Comment: NEGATIVE
Comment: NEGATIVE
Comment: NEGATIVE
Comment: NORMAL
Neisseria Gonorrhea: NEGATIVE
Trichomonas: POSITIVE — AB

## 2021-06-08 NOTE — Progress Notes (Signed)
Attestation of Attending Supervision of clinical support staff: I agree with the care provided to this patient and was available for any consultation.  I have reviewed the RN's note and chart. I was available for consult and to see the patient if needed.   Briane Birden MD MPH Attending Physician Faculty Practice- [facility]  

## 2021-06-11 ENCOUNTER — Other Ambulatory Visit: Payer: Self-pay | Admitting: Family Medicine

## 2021-06-11 ENCOUNTER — Telehealth: Payer: Self-pay

## 2021-06-11 DIAGNOSIS — A5901 Trichomonal vulvovaginitis: Secondary | ICD-10-CM

## 2021-06-11 MED ORDER — METRONIDAZOLE 500 MG PO TABS: 500.0000 mg | ORAL_TABLET | Freq: Two times a day (BID) | ORAL | 0 refills | Status: DC

## 2021-06-11 NOTE — Telephone Encounter (Signed)
Call placed to pt. Spoke with pt. Pt given results and Recommendations per Dr Ernestina Patches. Pt verbalized understanding and agreeable to plan of care. Pt advised getting partner treated at [facility] and no intercourse for 1 week after both have been treated. Pt also advised no ETOH use during treatment. Pt verbalized understanding.  Colletta Maryland, RN

## 2021-06-11 NOTE — Telephone Encounter (Signed)
-----   Message from Caren Macadam, MD sent at 06/11/2021 10:11 AM EDT ----- Trich positive and BV positive.

## 2021-07-17 ENCOUNTER — Ambulatory Visit (INDEPENDENT_AMBULATORY_CARE_PROVIDER_SITE_OTHER): Payer: Medicaid Other | Admitting: Advanced Practice Midwife

## 2021-07-17 ENCOUNTER — Other Ambulatory Visit: Payer: Self-pay

## 2021-07-17 ENCOUNTER — Other Ambulatory Visit (HOSPITAL_COMMUNITY)
Admission: RE | Admit: 2021-07-17 | Discharge: 2021-07-17 | Disposition: A | Discharge status: Home / Self Care | Payer: 59 | Source: Ambulatory Visit | Attending: Advanced Practice Midwife | Admitting: Advanced Practice Midwife

## 2021-07-17 ENCOUNTER — Encounter: Payer: Self-pay | Admitting: Advanced Practice Midwife

## 2021-07-17 VITALS — BP 128/81 | HR 85 | Wt 171.5 lb

## 2021-07-17 DIAGNOSIS — Z01419 Encounter for gynecological examination (general) (routine) without abnormal findings: Secondary | ICD-10-CM | POA: Insufficient documentation

## 2021-07-17 NOTE — Progress Notes (Signed)
GYNECOLOGY ANNUAL PREVENTATIVE CARE ENCOUNTER NOTE  Subjective:   Jody Robinson is a 30 y.o. [phone] female here for a routine annual gynecologic exam.  Current complaints: feels like there is a muscle or something in her vagina that protrudes occasionally.   Denies abnormal vaginal bleeding, discharge, pelvic pain, problems with intercourse or other gynecologic concerns.    Gynecologic History No LMP recorded (lmp unknown). Contraception: Nexplanon Last Pap: 02/2019. Results were: normal Last mammogram: NA. Results were: NA  Obstetric History OB History  Gravida Para Term Preterm AB Living  3 2 2   1 2   SAB IAB Ectopic Multiple Live Births  1     0 2    # Outcome Date GA Lbr Len/2nd Weight Sex Delivery Anes PTL Lv  3 Term 03/01/20 [redacted]w[redacted]d 03:23 / 00:15 3.81 kg F Vag-Spont EPI  LIV     Birth Comments: WDL   2 SAB 01/2019 [redacted]w[redacted]d         1 Term 03/08/13 [redacted]w[redacted]d  2.495 kg  Vag-Spont EPI  LIV     Birth Comments: no complications    Past Medical History:  Diagnosis Date   Abnormal Pap smear of cervix    Anemia in pregnancy 03/01/2020   S/p IV feraheme 6/18 and 02/04/2020  CBC Latest Ref Rng & Units 02/06/2020 01/19/2020 12/21/2019 WBC 4.0 - 10.5 K/uL 17.3(H) 13.8(H) 14.9(H) Hemoglobin 12.0 - 15.0 g/dL 9.7(L) 8.3(L) 8.5(L) Hematocrit 36 - 46 % 32.7(L) 26.9(L) 26.7(L) Platelets 150 - 400 K/uL 483(H) 472(H) 559(H)     Depression    History of COVID-19 09/10/2019   COVID+ 09/08/19 symptomatic    History of IUFD 02/06/2019   Less than 20 weeks    Past Surgical History:  Procedure Laterality Date   COLPOSCOPY      Current Outpatient Medications on File Prior to Visit  Medication Sig Dispense Refill   metroNIDAZOLE (FLAGYL) 500 MG tablet Take 1 tablet (500 mg total) by mouth 2 (two) times daily. 14 tablet 0   No current facility-administered medications on file prior to visit.    No Known Allergies  Social History   Socioeconomic History   Marital status: Single    Spouse name: Not  on file   Number of children: Not on file   Years of education: Not on file   Highest education level: Not on file  Occupational History   Not on file  Tobacco Use   Smoking status: Never   Smokeless tobacco: Never  Vaping Use   Vaping Use: Never used  Substance and Sexual Activity   Alcohol use: Not Currently    Comment: occasional   Drug use: No   Sexual activity: Yes    Birth control/protection: None  Other Topics Concern   Not on file  Social History Narrative   Not on file   Social Determinants of Health   Financial Resource Strain: Not on file  Food Insecurity: Not on file  Transportation Needs: Not on file  Physical Activity: Not on file  Stress: Not on file  Social Connections: Not on file  Intimate Partner Violence: Not on file    Family History  Problem Relation Age of Onset   Diabetes Mother     The following portions of the patient's history were reviewed and updated as appropriate: allergies, current medications, past family history, past medical history, past social history, past surgical history and problem list.  Review of Systems Pertinent items noted in HPI and remainder of comprehensive ROS  otherwise negative.   Objective:  BP 128/81   Pulse 85   Wt 77.8 kg   LMP  (LMP Unknown)   BMI 33.49 kg/m  CONSTITUTIONAL: Well-developed, well-nourished female in no acute distress.  HENT:  Normocephalic, atraumatic, External right and left ear normal. Oropharynx is clear and moist EYES: Conjunctivae and EOM are normal. Pupils are equal, round, and reactive to light. No scleral icterus.  NECK: Normal range of motion, supple, no masses.  Normal thyroid.  SKIN: Skin is warm and dry. No rash noted. Not diaphoretic. No erythema. No pallor. NEUROLOGIC: Alert and oriented to person, place, and time. Normal reflexes, muscle tone coordination. No cranial nerve deficit noted. PSYCHIATRIC: Normal mood and affect. Normal behavior. Normal judgment and thought  content. CARDIOVASCULAR: Normal heart rate noted, regular rhythm RESPIRATORY: Clear to auscultation bilaterally. Effort and breath sounds normal, no problems with respiration noted. BREASTS: Symmetric in size. No masses, skin changes, nipple drainage, or lymphadenopathy. ABDOMEN: Soft, normal bowel sounds, no distention noted.  No tenderness, rebound or guarding.  PELVIC: Normal appearing external genitalia; normal appearing vaginal mucosa and cervix.  No abnormal discharge noted.  Pap smear obtained.  Normal uterine size, no other palpable masses, no uterine or adnexal tenderness. MUSCULOSKELETAL: Normal range of motion. No tenderness.  No cyanosis, clubbing, or edema.  2+ distal pulses.   Assessment and Plan:  1. Encounter for annual routine gynecological examination - Cervicovaginal ancillary only( [facility]) - HIV Antibody (routine testing w rflx) - RPR - Hepatitis B Surface AntiGEN  2. Rectocele Offered pelvic floor PT, patient declines.   Not due for pap until next year  Routine preventative health maintenance measures emphasized. Please refer to After Visit Summary for other counseling recommendations.    Marcille Buffy DNP, CNM  07/17/21  3:41 PM

## 2021-07-18 LAB — HIV ANTIBODY (ROUTINE TESTING W REFLEX): HIV Screen 4th Generation wRfx: NONREACTIVE

## 2021-07-18 LAB — CERVICOVAGINAL ANCILLARY ONLY
Chlamydia: NEGATIVE
Comment: NEGATIVE
Comment: NEGATIVE
Comment: NORMAL
Neisseria Gonorrhea: NEGATIVE
Trichomonas: NEGATIVE

## 2021-07-18 LAB — HEPATITIS B SURFACE ANTIGEN: Hepatitis B Surface Ag: NEGATIVE

## 2021-07-18 LAB — RPR: RPR Ser Ql: NONREACTIVE

## 2021-12-04 ENCOUNTER — Other Ambulatory Visit: Payer: Self-pay

## 2021-12-04 ENCOUNTER — Encounter (HOSPITAL_BASED_OUTPATIENT_CLINIC_OR_DEPARTMENT_OTHER): Payer: Self-pay | Admitting: Emergency Medicine

## 2021-12-04 DIAGNOSIS — B379 Candidiasis, unspecified: Secondary | ICD-10-CM | POA: Insufficient documentation

## 2021-12-04 DIAGNOSIS — N898 Other specified noninflammatory disorders of vagina: Secondary | ICD-10-CM | POA: Diagnosis present

## 2021-12-04 LAB — URINALYSIS, MICROSCOPIC (REFLEX)

## 2021-12-04 LAB — URINALYSIS, ROUTINE W REFLEX MICROSCOPIC
Bilirubin Urine: NEGATIVE
Glucose, UA: NEGATIVE mg/dL
Ketones, ur: NEGATIVE mg/dL
Nitrite: NEGATIVE
Protein, ur: 30 mg/dL — AB
Specific Gravity, Urine: 1.025 (ref 1.005–1.030)
pH: 7 (ref 5.0–8.0)

## 2021-12-04 LAB — PREGNANCY, URINE: Preg Test, Ur: NEGATIVE

## 2021-12-04 NOTE — ED Triage Notes (Addendum)
White vaginal discharge since yesterday. Denies pelvic pain/abdominal pain. Would like to be tested for STDs also.  ?

## 2021-12-05 ENCOUNTER — Emergency Department (HOSPITAL_BASED_OUTPATIENT_CLINIC_OR_DEPARTMENT_OTHER)
Admission: EM | Admit: 2021-12-05 | Discharge: 2021-12-05 | Disposition: A | Discharge status: Home / Self Care | Payer: 59 | Attending: Emergency Medicine | Admitting: Emergency Medicine

## 2021-12-05 ENCOUNTER — Encounter (HOSPITAL_BASED_OUTPATIENT_CLINIC_OR_DEPARTMENT_OTHER): Payer: Self-pay

## 2021-12-05 DIAGNOSIS — B3731 Acute candidiasis of vulva and vagina: Secondary | ICD-10-CM

## 2021-12-05 DIAGNOSIS — B379 Candidiasis, unspecified: Secondary | ICD-10-CM | POA: Diagnosis not present

## 2021-12-05 LAB — WET PREP, GENITAL
Clue Cells Wet Prep HPF POC: NONE SEEN
Sperm: NONE SEEN
Trich, Wet Prep: NONE SEEN
WBC, Wet Prep HPF POC: <10 (ref <10)

## 2021-12-05 MED ORDER — FLUCONAZOLE 150 MG PO TABS
150.0000 mg | ORAL_TABLET | Freq: Once | ORAL | Status: AC
  Administered 2021-12-05: 150 mg via ORAL

## 2021-12-05 MED ORDER — FLUCONAZOLE 150 MG PO TABS
150.0000 mg | ORAL_TABLET | Freq: Once | ORAL | Status: DC
Start: 2021-12-05 — End: 2021-12-05
  Filled 2021-12-05: qty 1

## 2021-12-05 MED ORDER — FLUCONAZOLE 200 MG PO TABS: 200.0000 mg | ORAL_TABLET | Freq: Every day | ORAL | 0 refills | Status: DC

## 2021-12-05 NOTE — ED Provider Notes (Signed)
?[facility] EMERGENCY DEPARTMENT ?Provider Note ? ? ?CSN: [id number] ?Arrival date & time: 12/04/21  2217 ? ?  ? ?History ? ?Chief Complaint  ?Patient presents with  ? Vaginal Discharge  ? ? ?Jody Robinson is a 31 y.o. female. ? ?The history is provided by the patient.  ?Vaginal Discharge ?Quality:  White ?Severity:  Mild ?Onset quality:  Gradual ?Duration:  1 day ?Timing:  Sporadic ?Progression:  Unchanged ?Chronicity:  New ?Context: spontaneously   ?Relieved by:  Nothing ?Worsened by:  Nothing ?Ineffective treatments:  None tried ?Associated symptoms: no abdominal pain, no dysuria, no fever and no genital lesions   ?Risk factors: no endometriosis and no new sexual partner   ? ?  ? ?Home Medications ?Prior to Admission medications   ?Medication Sig Start Date End Date Taking? Authorizing Provider  ?metroNIDAZOLE (FLAGYL) 500 MG tablet Take 1 tablet (500 mg total) by mouth 2 (two) times daily. 06/11/21   Caren Macadam, MD  ?   ? ?Allergies    ?Patient has no known allergies.   ? ?Review of Systems   ?Review of Systems  ?Constitutional:  Negative for fever.  ?HENT:  Negative for congestion.   ?Eyes:  Negative for redness.  ?Respiratory:  Negative for wheezing and stridor.   ?Cardiovascular:  Negative for leg swelling.  ?Gastrointestinal:  Negative for abdominal pain.  ?Genitourinary:  Positive for vaginal discharge. Negative for dysuria.  ?Neurological:  Negative for facial asymmetry.  ?Psychiatric/Behavioral:  Negative for agitation.   ?All other systems reviewed and are negative. ? ?Physical Exam ?Updated Vital Signs ?BP 118/80   Pulse 86   Temp 98 ?F (36.7 ?C) (Oral)   Ht 5' (1.524 m)   Wt 77.8 kg   LMP 11/18/2021 (Approximate)   SpO2 100%   BMI 33.50 kg/m?  ?Physical Exam ?Vitals and nursing note reviewed.  ?Constitutional:   ?   General: She is not in acute distress. ?   Appearance: Normal appearance.  ?HENT:  ?   Head: Normocephalic and atraumatic.  ?   Nose: Nose normal.  ?Eyes:  ?    Conjunctiva/sclera: Conjunctivae normal.  ?   Pupils: Pupils are equal, round, and reactive to light.  ?Cardiovascular:  ?   Rate and Rhythm: Normal rate and regular rhythm.  ?   Pulses: Normal pulses.  ?   Heart sounds: Normal heart sounds.  ?Pulmonary:  ?   Effort: Pulmonary effort is normal.  ?   Breath sounds: Normal breath sounds.  ?Abdominal:  ?   Palpations: Abdomen is soft.  ?   Tenderness: There is no abdominal tenderness. There is no guarding.  ?Musculoskeletal:     ?   General: Normal range of motion.  ?   Cervical back: Normal range of motion and neck supple.  ?Skin: ?   General: Skin is warm and dry.  ?   Capillary Refill: Capillary refill takes less than 2 seconds.  ?Neurological:  ?   General: No focal deficit present.  ?   Mental Status: She is alert and oriented to person, place, and time.  ?   Deep Tendon Reflexes: Reflexes normal.  ?Psychiatric:     ?   Mood and Affect: Mood normal.     ?   Behavior: Behavior normal.  ? ? ?ED Results / Procedures / Treatments   ?Labs ?(all labs ordered are listed, but only abnormal results are displayed) ?Results for orders placed or performed during the hospital encounter of 12/05/21  ?  Wet prep, genital  ? Specimen: Cervix  ?Result Value Ref Range  ? Yeast Wet Prep HPF POC PRESENT (A) NONE SEEN  ? Trich, Wet Prep NONE SEEN NONE SEEN  ? Clue Cells Wet Prep HPF POC NONE SEEN NONE SEEN  ? WBC, Wet Prep HPF POC <10 <10  ? Sperm NONE SEEN   ?Pregnancy, urine  ?Result Value Ref Range  ? Preg Test, Ur NEGATIVE NEGATIVE  ?Urinalysis, Routine w reflex microscopic  ?Result Value Ref Range  ? Color, Urine YELLOW YELLOW  ? APPearance HAZY (A) CLEAR  ? Specific Gravity, Urine 1.025 1.005 - 1.030  ? pH 7.0 5.0 - 8.0  ? Glucose, UA NEGATIVE NEGATIVE mg/dL  ? Hgb urine dipstick MODERATE (A) NEGATIVE  ? Bilirubin Urine NEGATIVE NEGATIVE  ? Ketones, ur NEGATIVE NEGATIVE mg/dL  ? Protein, ur 30 (A) NEGATIVE mg/dL  ? Nitrite NEGATIVE NEGATIVE  ? Leukocytes,Ua SMALL (A) NEGATIVE   ?Urinalysis, Microscopic (reflex)  ?Result Value Ref Range  ? RBC / HPF 6-10 0 - 5 RBC/hpf  ? WBC, UA 6-10 0 - 5 WBC/hpf  ? Bacteria, UA FEW (A) NONE SEEN  ? Squamous Epithelial / LPF 11-20 0 - 5  ? Mucus PRESENT   ? ?No results found. ? ?Radiology ?No results found. ? ?Procedures ?Procedures  ? ? ?Medications Ordered in ED ?Medications  ?fluconazole (DIFLUCAN) tablet 100 mg (has no administration in time range)  ? ? ?ED Course/ Medical Decision Making/ A&P ?  ?                        ?Medical Decision Making ?Scant white discharge today.  No associated symptoms  ? ?Amount and/or Complexity of Data Reviewed ?External Data Reviewed: notes. ?   Details: previous ED notes ?Labs: ordered. ?   Details: all labs reviewed by me: pregnancy is negative, urine without UTI; wet prep is consistent with yeast infection ? ?Risk ?Prescription drug management. ?Risk Details: Well appearing with normal exam and vitals.  Will treat for yeast infection.  Please follow up with your GYN for ongoing care.   ? ? ? ?Final Clinical Impression(s) / ED Diagnoses ?Final diagnoses:  ?None  ?Return for intractable cough, coughing up blood, fevers > 100.4 unrelieved by medication, shortness of breath, intractable vomiting, chest pain, shortness of breath, weakness, numbness, changes in speech, facial asymmetry, abdominal pain, passing out, Inability to tolerate liquids or food, cough, altered mental status or any concerns. No signs of systemic illness or infection. The patient is nontoxic-appearing on exam and vital signs are within normal limits.  ?I have reviewed the triage vital signs and the nursing notes. Pertinent labs & imaging results that were available during my care of the patient were reviewed by me and considered in my medical decision making (see chart for details). After history, exam, and medical workup I feel the patient has been appropriately medically screened and is safe for discharge home. Pertinent diagnoses were discussed  with the patient. Patient was given return precautions. ?  ?  ? ?Rx / DC Orders ?ED Discharge Orders   ? ? None  ? ?  ? ? ?  ?Kelleigh Skerritt, MD ?12/05/21 0052 ? ?

## 2021-12-05 NOTE — ED Notes (Signed)
Patient discharged to home.  All discharge instructions reviewed.  Patient verbalized understanding via teachback method.  VS WDL.  Respirations even and unlabored.  Ambulatory out of ED.   °

## 2021-12-06 LAB — GC/CHLAMYDIA PROBE AMP (~~LOC~~) NOT AT ARMC
Chlamydia: NEGATIVE
Comment: NEGATIVE
Comment: NORMAL
Neisseria Gonorrhea: NEGATIVE

## 2022-01-16 ENCOUNTER — Encounter: Payer: Self-pay | Admitting: Family Medicine

## 2022-01-16 ENCOUNTER — Ambulatory Visit (INDEPENDENT_AMBULATORY_CARE_PROVIDER_SITE_OTHER): Payer: 59 | Admitting: Family Medicine

## 2022-01-16 DIAGNOSIS — Z3046 Encounter for surveillance of implantable subdermal contraceptive: Secondary | ICD-10-CM | POA: Diagnosis not present

## 2022-01-16 NOTE — Progress Notes (Signed)
     GYNECOLOGY OFFICE PROCEDURE NOTE  Jody Robinson is a 31 y.o. [id number] here for Nexplanon removal.  Last pap smear was on  Lab Results  Component Value Date   DIAGPAP  03/05/2019    NEGATIVE FOR INTRAEPITHELIAL LESIONS OR MALIGNANCY.   DIAGPAP TRICHOMONAS VAGINALIS PRESENT. 03/05/2019   DIAGPAP  03/05/2019    FUNGAL ORGANISMS PRESENT CONSISTENT WITH CANDIDA SPP.    and was normal. No other gynecologic concerns.  Nexplanon removal Procedure Patient identified, informed consent performed, consent signed.   Appropriate time out taken. Nexplanon site identified.  Area prepped in usual sterile fashon. One ml of 1% lidocaine was used to anesthetize the area at the distal end of the implant. A small stab incision was made right beside the implant on the distal portion.  The Nexplanon rod was grasped using hemostats and removed without difficulty.  There was minimal blood loss. There were no complications.  3 ml of 1% lidocaine was injected around the incision for post-procedure analgesia.  Steri-strips were applied over the small incision.  A pressure bandage was applied to reduce any bruising.  The patient tolerated the procedure well and was given post procedure instructions.  Patient declines other conception.  Clarnce Flock, MD/[facility], [facility] for [employer], [place]

## 2022-03-07 ENCOUNTER — Emergency Department (HOSPITAL_COMMUNITY)
Admission: EM | Admit: 2022-03-07 | Discharge: 2022-03-07 | Disposition: A | Discharge status: Home / Self Care | Payer: 59 | Attending: Emergency Medicine | Admitting: Emergency Medicine

## 2022-03-07 ENCOUNTER — Encounter (HOSPITAL_COMMUNITY): Payer: Self-pay

## 2022-03-07 ENCOUNTER — Other Ambulatory Visit: Payer: Self-pay

## 2022-03-07 DIAGNOSIS — R1033 Periumbilical pain: Secondary | ICD-10-CM | POA: Diagnosis not present

## 2022-03-07 DIAGNOSIS — D72829 Elevated white blood cell count, unspecified: Secondary | ICD-10-CM | POA: Insufficient documentation

## 2022-03-07 DIAGNOSIS — R519 Headache, unspecified: Secondary | ICD-10-CM

## 2022-03-07 DIAGNOSIS — R112 Nausea with vomiting, unspecified: Secondary | ICD-10-CM | POA: Diagnosis present

## 2022-03-07 LAB — URINALYSIS, ROUTINE W REFLEX MICROSCOPIC
Bilirubin Urine: NEGATIVE
Glucose, UA: NEGATIVE mg/dL
Ketones, ur: NEGATIVE mg/dL
Nitrite: NEGATIVE
Protein, ur: NEGATIVE mg/dL
Specific Gravity, Urine: 1.015 (ref 1.005–1.030)
pH: 6 (ref 5.0–8.0)

## 2022-03-07 LAB — CBC WITH DIFFERENTIAL/PLATELET
Abs Immature Granulocytes: 0.02 10*3/uL (ref 0.00–0.07)
Basophils Absolute: 0 10*3/uL (ref 0.0–0.1)
Basophils Relative: 0 %
Eosinophils Absolute: 0.1 10*3/uL (ref 0.0–0.5)
Eosinophils Relative: 1 %
HCT: 37.7 % (ref 36.0–46.0)
Hemoglobin: 11.9 g/dL — ABNORMAL LOW (ref 12.0–15.0)
Immature Granulocytes: 0 %
Lymphocytes Relative: 20 %
Lymphs Abs: 1.6 10*3/uL (ref 0.7–4.0)
MCH: 26.2 pg (ref 26.0–34.0)
MCHC: 31.6 g/dL (ref 30.0–36.0)
MCV: 82.9 fL (ref 80.0–100.0)
Monocytes Absolute: 0.7 10*3/uL (ref 0.1–1.0)
Monocytes Relative: 8 %
Neutro Abs: 5.6 10*3/uL (ref 1.7–7.7)
Neutrophils Relative %: 71 %
Platelets: 618 10*3/uL — ABNORMAL HIGH (ref 150–400)
RBC: 4.55 MIL/uL (ref 3.87–5.11)
RDW: 14.6 % (ref 11.5–15.5)
WBC: 8 10*3/uL (ref 4.0–10.5)
nRBC: 0 % (ref 0.0–0.2)

## 2022-03-07 LAB — COMPREHENSIVE METABOLIC PANEL
ALT: 14 U/L (ref 0–44)
AST: 17 U/L (ref 15–41)
Albumin: 3.7 g/dL (ref 3.5–5.0)
Alkaline Phosphatase: 80 U/L (ref 38–126)
Anion gap: 9 (ref 5–15)
BUN: 11 mg/dL (ref 6–20)
CO2: 21 mmol/L — ABNORMAL LOW (ref 22–32)
Calcium: 8.6 mg/dL — ABNORMAL LOW (ref 8.9–10.3)
Chloride: 109 mmol/L (ref 98–111)
Creatinine, Ser: 0.69 mg/dL (ref 0.44–1.00)
GFR, Estimated: >60 mL/min (ref >60)
Glucose, Bld: 105 mg/dL — ABNORMAL HIGH (ref 70–99)
Potassium: 3.3 mmol/L — ABNORMAL LOW (ref 3.5–5.1)
Sodium: 139 mmol/L (ref 135–145)
Total Bilirubin: 1.4 mg/dL — ABNORMAL HIGH (ref 0.3–1.2)
Total Protein: 7.8 g/dL (ref 6.5–8.1)

## 2022-03-07 LAB — I-STAT CHEM 8, ED
BUN: 11 mg/dL (ref 6–20)
Calcium, Ion: 1.11 mmol/L — ABNORMAL LOW (ref 1.15–1.40)
Chloride: 103 mmol/L (ref 98–111)
Creatinine, Ser: 0.7 mg/dL (ref 0.44–1.00)
Glucose, Bld: 105 mg/dL — ABNORMAL HIGH (ref 70–99)
HCT: 37 % (ref 36.0–46.0)
Hemoglobin: 12.6 g/dL (ref 12.0–15.0)
Potassium: 3.4 mmol/L — ABNORMAL LOW (ref 3.5–5.1)
Sodium: 139 mmol/L (ref 135–145)
TCO2: 24 mmol/L (ref 22–32)

## 2022-03-07 LAB — I-STAT BETA HCG BLOOD, ED (MC, WL, AP ONLY): I-stat hCG, quantitative: <5 m[IU]/mL (ref <5)

## 2022-03-07 LAB — LIPASE, BLOOD: Lipase: 26 U/L (ref 11–51)

## 2022-03-07 MED ORDER — METOCLOPRAMIDE HCL 5 MG/ML IJ SOLN
10.0000 mg | Freq: Once | INTRAMUSCULAR | Status: AC
  Administered 2022-03-07: 10 mg via INTRAVENOUS
  Filled 2022-03-07: qty 2

## 2022-03-07 MED ORDER — ONDANSETRON 4 MG PO TBDP: 4.0000 mg | ORAL_TABLET | Freq: Three times a day (TID) | ORAL | 0 refills | Status: DC | PRN

## 2022-03-07 MED ORDER — DIPHENHYDRAMINE HCL 50 MG/ML IJ SOLN
25.0000 mg | Freq: Once | INTRAMUSCULAR | Status: AC
  Administered 2022-03-07: 25 mg via INTRAVENOUS
  Filled 2022-03-07: qty 1

## 2022-03-07 MED ORDER — SODIUM CHLORIDE 0.9 % IV BOLUS
1000.0000 mL | Freq: Once | INTRAVENOUS | Status: AC
  Administered 2022-03-07: 1000 mL via INTRAVENOUS

## 2022-03-07 NOTE — ED Provider Notes (Signed)
[facility] DEPT Provider Note   CSN: [id number] Arrival date & time: 03/07/22  3818     History  Chief Complaint  Patient presents with   Emesis   Headache    Jody Robinson is a 31 y.o. female.  She presents to the ED with abdominal pain, nausea, vomiting x 2 days, and HA that began this morning. She reports the pain began prior to the vomiting, and the pain is periumbilical. She denies the pain has moved from the periumbilical area. She states the pain is "mild." She reports vomiting throughout yesterday, and has a loss of appetite. She states she has been trying to drink fluids, although not water. She reports taking a pregnancy test yesterday that was negative. She denies any abnormalities in her appetite prior to the symptoms. She denies any prior abdominal surgeries, although does reports a history of pancreatitis while pregnant. She denies recent alcohol use or drug use.  Headache is frontal, mild.  No confusion or vision change.       Home Medications Prior to Admission medications   Medication Sig Start Date End Date Taking? Authorizing Provider  fluconazole (DIFLUCAN) 200 MG tablet Take 1 tablet (200 mg total) by mouth daily. Patient not taking: Reported on 01/16/2022 12/05/21   Palumbo, April, MD  metroNIDAZOLE (FLAGYL) 500 MG tablet Take 1 tablet (500 mg total) by mouth 2 (two) times daily. Patient not taking: Reported on 01/16/2022 06/11/21   Caren Macadam, MD      Allergies    Patient has no known allergies.    Review of Systems   Review of Systems  Physical Exam Updated Vital Signs BP 123/78 (BP Location: Left Arm)   Pulse 98   Temp 98.4 F (36.9 C) (Oral)   Resp 16   Ht 5' (1.524 m)   Wt 76.7 kg   LMP 02/15/2022 (Approximate)   SpO2 100%   BMI 33.01 kg/m  Physical Exam Vitals and nursing note reviewed.  Constitutional:      General: She is not in acute distress.    Appearance: She is well-developed.  HENT:      Head: Normocephalic and atraumatic.     Right Ear: External ear normal.     Left Ear: External ear normal.     Nose: Nose normal.  Eyes:     Conjunctiva/sclera: Conjunctivae normal.  Cardiovascular:     Rate and Rhythm: Normal rate and regular rhythm.     Heart sounds: No murmur heard. Pulmonary:     Effort: No respiratory distress.     Breath sounds: No wheezing, rhonchi or rales.  Abdominal:     Palpations: Abdomen is soft.     Tenderness: There is abdominal tenderness. There is no guarding or rebound.     Comments: Mild periumbilical tenderness to palpation without rebound or guarding.  Patient does not wince or react when I palpate the mid abdomen.  Musculoskeletal:     Cervical back: Normal range of motion and neck supple.     Right lower leg: No edema.     Left lower leg: No edema.  Skin:    General: Skin is warm and dry.     Findings: No rash.  Neurological:     General: No focal deficit present.     Mental Status: She is alert. Mental status is at baseline.     Motor: No weakness.  Psychiatric:        Mood and Affect: Mood normal.  ED Results / Procedures / Treatments   Labs (all labs ordered are listed, but only abnormal results are displayed) Labs Reviewed  CBC WITH DIFFERENTIAL/PLATELET - Abnormal; Notable for the following components:      Result Value   Hemoglobin 11.9 (*)    Platelets 618 (*)    All other components within normal limits  COMPREHENSIVE METABOLIC PANEL - Abnormal; Notable for the following components:   Potassium 3.3 (*)    CO2 21 (*)    Glucose, Bld 105 (*)    Calcium 8.6 (*)    Total Bilirubin 1.4 (*)    All other components within normal limits  URINALYSIS, ROUTINE W REFLEX MICROSCOPIC - Abnormal; Notable for the following components:   Hgb urine dipstick MODERATE (*)    Leukocytes,Ua TRACE (*)    Bacteria, UA RARE (*)    All other components within normal limits  I-STAT CHEM 8, ED - Abnormal; Notable for the following  components:   Potassium 3.4 (*)    Glucose, Bld 105 (*)    Calcium, Ion 1.11 (*)    All other components within normal limits  LIPASE, BLOOD  I-STAT BETA HCG BLOOD, ED ([facility], [facility], [facility] ONLY)    EKG None  Radiology No results found.  Procedures Procedures    Medications Ordered in ED Medications  metoCLOPramide (REGLAN) injection 10 mg (has no administration in time range)  diphenhydrAMINE (BENADRYL) injection 25 mg (has no administration in time range)  sodium chloride 0.9 % bolus 1,000 mL (has no administration in time range)    ED Course/ Medical Decision Making/ A&P    Patient seen and examined. History obtained directly from patient.   Labs/EKG: Ordered CBC, CMP, lipase, pregnancy test, UA.  Imaging: None ordered  Medications/Fluids: Ordered: IV Reglan/Benadryl, IV fluid bolus  Most recent vital signs reviewed and are as follows: BP 123/78 (BP Location: Left Arm)   Pulse 98   Temp 98.4 F (36.9 C) (Oral)   Resp 16   Ht 5' (1.524 m)   Wt 76.7 kg   LMP 02/15/2022 (Approximate)   SpO2 100%   BMI 33.01 kg/m   Initial impression: Abdominal pain with vomiting, no diarrhea, benign sounding headache without neuro deficits.  12:51 PM Reassessment performed. Patient appears stable.  States that she feels feeling a little better.  Labs personally reviewed and interpreted including: CBC with elevated platelets, normal white blood cell count, minimal anemia at 11.9 hemoglobin; CMP with mild hypokalemia 3.3, glucose 105 otherwise unremarkable; lipase normal; negative pregnancy; UA with some blood, no indication of infection.  Reviewed pertinent lab work and imaging with patient at bedside. Questions answered.  Noted blood.  Symptoms are not currently consistent with ureteral colic.  Denies current menstrual bleeding.  Encourage patient to have this monitored.  Most current vital signs reviewed and are as follows: BP 106/79   Pulse 78   Temp 98.4 F (36.9 C) (Oral)    Resp 18   Ht 5' (1.524 m)   Wt 76.7 kg   LMP 02/15/2022 (Approximate)   SpO2 100%   BMI 33.01 kg/m   Plan: Discharge to home.   Prescriptions written for: Zofran  Other home care instructions discussed: Criss Rosales diet, avoidance of foods that make symptoms worse  ED return instructions discussed: The patient was urged to return to the Emergency Department immediately with worsening of current symptoms, worsening abdominal pain, persistent vomiting, blood noted in stools, fever, or any other concerns. The patient verbalized understanding.  Follow-up instructions discussed: Patient encouraged to follow-up with their PCP in 2 days.                            Medical Decision Making Amount and/or Complexity of Data Reviewed Labs: ordered.  Risk Prescription drug management.   For this patient's complaint of abdominal pain, the following conditions were considered on the differential diagnosis: gastritis/PUD, enteritis/duodenitis, appendicitis, cholelithiasis/cholecystitis, cholangitis, pancreatitis, ruptured viscus, colitis, diverticulitis, small/large bowel obstruction, proctitis, cystitis, pyelonephritis, ureteral colic, aortic dissection, aortic aneurysm. In women, ectopic pregnancy, pelvic inflammatory disease, ovarian cysts, and tubo-ovarian abscess were also considered. Atypical chest etiologies were also considered including ACS, PE, and pneumonia.   The patient's vital signs, pertinent lab work and imaging were reviewed and interpreted as discussed in the ED course. Hospitalization was considered for further testing, treatments, or serial exams/observation. However as patient is well-appearing, has a stable exam, and reassuring studies today, I do not feel that they warrant admission at this time. This plan was discussed with the patient who verbalizes agreement and comfort with this plan and seems reliable and able to return to the Emergency Department with worsening or changing  symptoms.          Final Clinical Impression(s) / ED Diagnoses Final diagnoses:  Periumbilical abdominal pain  Nausea and vomiting, unspecified vomiting type  Acute nonintractable headache, unspecified headache type    Rx / DC Orders ED Discharge Orders          Ordered    ondansetron (ZOFRAN-ODT) 4 MG disintegrating tablet  Every 8 hours PRN        03/07/22 1249              Carlisle Cater, PA-C 03/07/22 [place], [place], DO 03/07/22 1307

## 2022-03-07 NOTE — Discharge Instructions (Signed)
Please read and follow all provided instructions.  Your diagnoses today include:  1. Periumbilical abdominal pain   2. Nausea and vomiting, unspecified vomiting type   3. Acute nonintractable headache, unspecified headache type     Tests performed today include: Blood cell counts and platelets: showed normal white cell count and near normal red blood cell count Kidney and liver function tests: normal Pancreas function test (called lipase) Urine test to look for infection: small amount of blood but no infection A blood or urine test for pregnancy (women only): was negative Vital signs. See below for your results today.   Medications prescribed:  Zofran (ondansetron) - for nausea and vomiting  Take any prescribed medications only as directed.  Home care instructions:  Follow any educational materials contained in this packet.  Follow-up instructions: Please follow-up with your primary care provider in the next 2 days for further evaluation of your symptoms.    Return instructions:  SEEK IMMEDIATE MEDICAL ATTENTION IF: The pain does not go away or becomes severe  A temperature above 101F develops  Repeated vomiting occurs (multiple episodes)  The pain becomes localized to portions of the abdomen. The right side could possibly be appendicitis. In an adult, the left lower portion of the abdomen could be colitis or diverticulitis.  Blood is being passed in stools or vomit (bright red or black tarry stools)  You develop chest pain, difficulty breathing, dizziness or fainting, or become confused, poorly responsive, or inconsolable (young children) If you have any other emergent concerns regarding your health  Additional Information: Abdominal (belly) pain can be caused by many things. Your caregiver performed an examination and possibly ordered blood/urine tests and imaging (CT scan, x-rays, ultrasound). Many cases can be observed and treated at home after initial evaluation in the  emergency department. Even though you are being discharged home, abdominal pain can be unpredictable. Therefore, you need a repeated exam if your pain does not resolve, returns, or worsens. Most patients with abdominal pain don't have to be admitted to the hospital or have surgery, but serious problems like appendicitis and gallbladder attacks can start out as nonspecific pain. Many abdominal conditions cannot be diagnosed in one visit, so follow-up evaluations are very important.  Your vital signs today were: BP 106/79   Pulse 78   Temp 98.4 F (36.9 C) (Oral)   Resp 18   Ht 5' (1.524 m)   Wt 76.7 kg   LMP 02/15/2022 (Approximate)   SpO2 100%   BMI 33.01 kg/m  If your blood pressure (bp) was elevated above 135/85 this visit, please have this repeated by your doctor within one month. --------------

## 2022-03-07 NOTE — ED Triage Notes (Signed)
Patient c/o N/V and headache since waking yesterday. Patient denies any blurred vision or sensitivity to light.

## 2022-03-27 ENCOUNTER — Encounter (HOSPITAL_BASED_OUTPATIENT_CLINIC_OR_DEPARTMENT_OTHER): Payer: Self-pay | Admitting: Emergency Medicine

## 2022-03-27 ENCOUNTER — Other Ambulatory Visit: Payer: Self-pay

## 2022-03-27 ENCOUNTER — Emergency Department (HOSPITAL_BASED_OUTPATIENT_CLINIC_OR_DEPARTMENT_OTHER)
Admission: EM | Admit: 2022-03-27 | Discharge: 2022-03-27 | Disposition: A | Discharge status: Home / Self Care | Payer: 59 | Attending: Emergency Medicine | Admitting: Emergency Medicine

## 2022-03-27 DIAGNOSIS — R112 Nausea with vomiting, unspecified: Secondary | ICD-10-CM | POA: Insufficient documentation

## 2022-03-27 DIAGNOSIS — H53149 Visual discomfort, unspecified: Secondary | ICD-10-CM | POA: Insufficient documentation

## 2022-03-27 DIAGNOSIS — R519 Headache, unspecified: Secondary | ICD-10-CM | POA: Diagnosis present

## 2022-03-27 DIAGNOSIS — R42 Dizziness and giddiness: Secondary | ICD-10-CM | POA: Insufficient documentation

## 2022-03-27 LAB — CBC WITH DIFFERENTIAL/PLATELET
Abs Immature Granulocytes: 0.05 10*3/uL (ref 0.00–0.07)
Basophils Absolute: 0.1 10*3/uL (ref 0.0–0.1)
Basophils Relative: 0 %
Eosinophils Absolute: 0 10*3/uL (ref 0.0–0.5)
Eosinophils Relative: 0 %
HCT: 37.1 % (ref 36.0–46.0)
Hemoglobin: 11.8 g/dL — ABNORMAL LOW (ref 12.0–15.0)
Immature Granulocytes: 0 %
Lymphocytes Relative: 12 %
Lymphs Abs: 1.6 10*3/uL (ref 0.7–4.0)
MCH: 26.2 pg (ref 26.0–34.0)
MCHC: 31.8 g/dL (ref 30.0–36.0)
MCV: 82.3 fL (ref 80.0–100.0)
Monocytes Absolute: 0.5 10*3/uL (ref 0.1–1.0)
Monocytes Relative: 4 %
Neutro Abs: 10.9 10*3/uL — ABNORMAL HIGH (ref 1.7–7.7)
Neutrophils Relative %: 84 %
Platelets: 682 10*3/uL — ABNORMAL HIGH (ref 150–400)
RBC: 4.51 MIL/uL (ref 3.87–5.11)
RDW: 14.3 % (ref 11.5–15.5)
WBC: 13.1 10*3/uL — ABNORMAL HIGH (ref 4.0–10.5)
nRBC: 0 % (ref 0.0–0.2)

## 2022-03-27 LAB — COMPREHENSIVE METABOLIC PANEL
ALT: 14 U/L (ref 0–44)
AST: 18 U/L (ref 15–41)
Albumin: 4.4 g/dL (ref 3.5–5.0)
Alkaline Phosphatase: 92 U/L (ref 38–126)
Anion gap: 9 (ref 5–15)
BUN: 10 mg/dL (ref 6–20)
CO2: 25 mmol/L (ref 22–32)
Calcium: 9.1 mg/dL (ref 8.9–10.3)
Chloride: 103 mmol/L (ref 98–111)
Creatinine, Ser: 0.73 mg/dL (ref 0.44–1.00)
GFR, Estimated: >60 mL/min (ref >60)
Glucose, Bld: 119 mg/dL — ABNORMAL HIGH (ref 70–99)
Potassium: 3.9 mmol/L (ref 3.5–5.1)
Sodium: 137 mmol/L (ref 135–145)
Total Bilirubin: 0.8 mg/dL (ref 0.3–1.2)
Total Protein: 8.8 g/dL — ABNORMAL HIGH (ref 6.5–8.1)

## 2022-03-27 LAB — HCG, SERUM, QUALITATIVE: Preg, Serum: NEGATIVE

## 2022-03-27 LAB — CBG MONITORING, ED: Glucose-Capillary: 89 mg/dL (ref 70–99)

## 2022-03-27 MED ORDER — DEXAMETHASONE 4 MG PO TABS
10.0000 mg | ORAL_TABLET | Freq: Once | ORAL | Status: AC
  Administered 2022-03-27: 10 mg via ORAL
  Filled 2022-03-27: qty 3

## 2022-03-27 MED ORDER — PROCHLORPERAZINE EDISYLATE 10 MG/2ML IJ SOLN
10.0000 mg | Freq: Once | INTRAMUSCULAR | Status: AC
Start: 2022-03-27 — End: 2022-03-27
  Administered 2022-03-27: 10 mg via INTRAVENOUS
  Filled 2022-03-27: qty 2

## 2022-03-27 MED ORDER — SODIUM CHLORIDE 0.9 % IV BOLUS
1000.0000 mL | Freq: Once | INTRAVENOUS | Status: AC
Start: 2022-03-27 — End: 2022-03-27
  Administered 2022-03-27: 1000 mL via INTRAVENOUS

## 2022-03-27 MED ORDER — DIPHENHYDRAMINE HCL 50 MG/ML IJ SOLN
25.0000 mg | Freq: Once | INTRAMUSCULAR | Status: AC
  Administered 2022-03-27: 25 mg via INTRAVENOUS
  Filled 2022-03-27: qty 1

## 2022-03-27 NOTE — Discharge Instructions (Signed)
I have placed an order for the neurologist to call you to set up an appointment to see you in the office.  Please return for worsening headache fever one-sided numbness or weakness difficulty speech or swallowing.

## 2022-03-27 NOTE — ED Provider Notes (Signed)
[facility] EMERGENCY DEPARTMENT Provider Note   CSN: [id number] Arrival date & time: 03/27/22  1309     History  Chief Complaint  Patient presents with   Dizziness    Jody Robinson is a 31 y.o. female.  31 yo F with a chief complaints of headache.  This started a little bit after lunchtime.  She had a break and went back into work and develops a headache.  Started slow and then got progressively worse.  Has a history of headaches but has never had 1 this bad.  Had some nausea and vomiting with it.  She tells me she has had an episode similar to this where her potassium was noted to be low.  She is wondering if that is occurred again.  Denies any difficulty eating or drinking other than this episode today.  She denies any trauma to the head of the neck.  Denies any neck pain.  Pain is worse with bright lights and loud noises.  No fevers no cough or congestion.   Dizziness      Home Medications Prior to Admission medications   Medication Sig Start Date End Date Taking? Authorizing Provider  fluconazole (DIFLUCAN) 200 MG tablet Take 1 tablet (200 mg total) by mouth daily. Patient not taking: Reported on 01/16/2022 12/05/21   Palumbo, April, MD  metroNIDAZOLE (FLAGYL) 500 MG tablet Take 1 tablet (500 mg total) by mouth 2 (two) times daily. Patient not taking: Reported on 01/16/2022 06/11/21   Caren Macadam, MD  ondansetron (ZOFRAN-ODT) 4 MG disintegrating tablet Take 1 tablet (4 mg total) by mouth every 8 (eight) hours as needed for nausea or vomiting. 03/07/22   Carlisle Cater, PA-C      Allergies    Patient has no known allergies.    Review of Systems   Review of Systems  Neurological:  Positive for dizziness.    Physical Exam Updated Vital Signs BP 96/64 (BP Location: Left Arm)   Pulse 69   Resp 18   Ht 5' (1.524 m)   Wt 78 kg   LMP 03/09/2022 (Approximate)   SpO2 98%   BMI 33.59 kg/m  Physical Exam Vitals and nursing note reviewed.   Constitutional:      General: She is not in acute distress.    Appearance: She is well-developed. She is not diaphoretic.  HENT:     Head: Normocephalic and atraumatic.  Eyes:     Pupils: Pupils are equal, round, and reactive to light.  Cardiovascular:     Rate and Rhythm: Normal rate and regular rhythm.     Heart sounds: No murmur heard.    No friction rub. No gallop.  Pulmonary:     Effort: Pulmonary effort is normal.     Breath sounds: No wheezing or rales.  Abdominal:     General: There is no distension.     Palpations: Abdomen is soft.     Tenderness: There is no abdominal tenderness.  Musculoskeletal:        General: No tenderness.     Cervical back: Normal range of motion and neck supple.  Skin:    General: Skin is warm and dry.  Neurological:     Mental Status: She is alert and oriented to person, place, and time.     GCS: GCS eye subscore is 4. GCS verbal subscore is 5. GCS motor subscore is 6.     Cranial Nerves: Cranial nerves 2-12 are intact.     Sensory:  Sensation is intact.     Motor: Motor function is intact.     Coordination: Coordination is intact.     Comments: Severe photophobia which limits exam otherwise no obvious noted neurologic deficit.  Psychiatric:        Behavior: Behavior normal.     ED Results / Procedures / Treatments   Labs (all labs ordered are listed, but only abnormal results are displayed) Labs Reviewed  CBC WITH DIFFERENTIAL/PLATELET - Abnormal; Notable for the following components:      Result Value   WBC 13.1 (*)    Hemoglobin 11.8 (*)    Platelets 682 (*)    Neutro Abs 10.9 (*)    All other components within normal limits  COMPREHENSIVE METABOLIC PANEL - Abnormal; Notable for the following components:   Glucose, Bld 119 (*)    Total Protein 8.8 (*)    All other components within normal limits  HCG, SERUM, QUALITATIVE  CBG MONITORING, ED    EKG EKG Interpretation  Date/Time:  Wednesday March 27 2022 13:30:44  EDT Ventricular Rate:  85 PR Interval:  166 QRS Duration: 84 QT Interval:  362 QTC Calculation: 430 R Axis:   53 Text Interpretation: Normal sinus rhythm No old tracing to compare Confirmed by Deno Etienne [phone]) on 03/27/2022 3:35:10 PM  Radiology No results found.  Procedures Procedures    Medications Ordered in ED Medications  dexamethasone (DECADRON) tablet 10 mg (has no administration in time range)  sodium chloride 0.9 % bolus 1,000 mL (1,000 mLs Intravenous New Bag/Given 03/27/22 1624)  prochlorperazine (COMPAZINE) injection 10 mg (10 mg Intravenous Given 03/27/22 1625)  diphenhydrAMINE (BENADRYL) injection 25 mg (25 mg Intravenous Given 03/27/22 1624)    ED Course/ Medical Decision Making/ A&P                           Medical Decision Making Amount and/or Complexity of Data Reviewed Labs: ordered.  Risk Prescription drug management.   30 yo F with a chief complaints of headache.  She has had a history of the same.  Was slow in onset and has gotten progressively worse.  Photophobia caused her to vomit.  Atraumatic.  No infectious symptoms.  She tells me she had an episode like this before that brought her to the hospital and she was noted to be hypokalemic.  We will check basic blood work.  Bolus of IV fluids.  Headache cocktail.  Reassess.  Patient is feeling a bit better on reassessment.  Blood work is largely unremarkable no hypokalemia is the patient was concerned about that.  Pregnancy test negative.  Will discharge home.  PCP follow-up.  Patient was given neurology follow-up as well.  5:11 PM:  I have discussed the diagnosis/risks/treatment options with the patient.  Evaluation and diagnostic testing in the emergency department does not suggest an emergent condition requiring admission or immediate intervention beyond what has been performed at this time.  They will follow up with neuro, PCP. We also discussed returning to the ED immediately if new or worsening sx  occur. We discussed the sx which are most concerning (e.g., sudden worsening pain, fever, inability to tolerate by mouth) that necessitate immediate return. Medications administered to the patient during their visit and any new prescriptions provided to the patient are listed below.  Medications given during this visit Medications  dexamethasone (DECADRON) tablet 10 mg (has no administration in time range)  sodium chloride 0.9 % bolus 1,000 mL (  1,000 mLs Intravenous New Bag/Given 03/27/22 1624)  prochlorperazine (COMPAZINE) injection 10 mg (10 mg Intravenous Given 03/27/22 1625)  diphenhydrAMINE (BENADRYL) injection 25 mg (25 mg Intravenous Given 03/27/22 1624)     The patient appears reasonably screen and/or stabilized for discharge and I doubt any other medical condition or other [facility] requiring further screening, evaluation, or treatment in the ED at this time prior to discharge.          Final Clinical Impression(s) / ED Diagnoses Final diagnoses:  Bad headache    Rx / DC Orders ED Discharge Orders          Ordered    Ambulatory referral to Neurology       Comments: New headache syndrome   03/27/22 [place], [place], DO 03/27/22 1711

## 2022-03-27 NOTE — ED Notes (Signed)
Attempt x 1 for labs by Catalina [employer]

## 2022-03-27 NOTE — ED Triage Notes (Addendum)
Pt reports HA, light-headedness that started today at work around [place]; sxs are not present with eyes closed

## 2022-04-03 ENCOUNTER — Encounter (HOSPITAL_BASED_OUTPATIENT_CLINIC_OR_DEPARTMENT_OTHER): Payer: Self-pay

## 2022-04-03 ENCOUNTER — Other Ambulatory Visit: Payer: Self-pay

## 2022-04-03 ENCOUNTER — Emergency Department (HOSPITAL_BASED_OUTPATIENT_CLINIC_OR_DEPARTMENT_OTHER)
Admission: EM | Admit: 2022-04-03 | Discharge: 2022-04-03 | Disposition: A | Discharge status: Home / Self Care | Payer: 59 | Attending: Emergency Medicine | Admitting: Emergency Medicine

## 2022-04-03 DIAGNOSIS — B9689 Other specified bacterial agents as the cause of diseases classified elsewhere: Secondary | ICD-10-CM | POA: Diagnosis not present

## 2022-04-03 DIAGNOSIS — N760 Acute vaginitis: Secondary | ICD-10-CM | POA: Diagnosis not present

## 2022-04-03 DIAGNOSIS — Z113 Encounter for screening for infections with a predominantly sexual mode of transmission: Secondary | ICD-10-CM | POA: Diagnosis present

## 2022-04-03 LAB — PREGNANCY, URINE: Preg Test, Ur: NEGATIVE

## 2022-04-03 LAB — HIV ANTIBODY (ROUTINE TESTING W REFLEX): HIV Screen 4th Generation wRfx: NONREACTIVE

## 2022-04-03 LAB — WET PREP, GENITAL
Sperm: NONE SEEN
Trich, Wet Prep: NONE SEEN
WBC, Wet Prep HPF POC: <10 (ref <10)
Yeast Wet Prep HPF POC: NONE SEEN

## 2022-04-03 MED ORDER — METRONIDAZOLE 500 MG PO TABS: 500.0000 mg | ORAL_TABLET | Freq: Two times a day (BID) | ORAL | 0 refills | Status: DC

## 2022-04-03 NOTE — ED Provider Notes (Signed)
[facility] EMERGENCY DEPARTMENT Provider Note   CSN: [id number] Arrival date & time: 04/03/22  1451     History  Chief Complaint  Patient presents with   Exposure to STD    Jody Robinson is a 31 y.o. female who presents emergency department for evaluation of STD check and pregnancy test.  Patient states that she had unprotected sex 2 weeks ago and she is 1 daily on her..  She denies symptoms of STD or vaginal infection including dysuria, hematuria, vaginal discharge, vaginal discomfort and abnormal vaginal bleeding.  She has no other complaints.   Exposure to STD       Home Medications Prior to Admission medications   Medication Sig Start Date End Date Taking? Authorizing Provider  metroNIDAZOLE (FLAGYL) 500 MG tablet Take 1 tablet (500 mg total) by mouth 2 (two) times daily. 04/03/22  Yes Kathe Becton R, PA-C  fluconazole (DIFLUCAN) 200 MG tablet Take 1 tablet (200 mg total) by mouth daily. Patient not taking: Reported on 01/16/2022 12/05/21   Palumbo, April, MD  metroNIDAZOLE (FLAGYL) 500 MG tablet Take 1 tablet (500 mg total) by mouth 2 (two) times daily. Patient not taking: Reported on 01/16/2022 06/11/21   Caren Macadam, MD  ondansetron (ZOFRAN-ODT) 4 MG disintegrating tablet Take 1 tablet (4 mg total) by mouth every 8 (eight) hours as needed for nausea or vomiting. 03/07/22   Carlisle Cater, PA-C      Allergies    Patient has no known allergies.    Review of Systems   Review of Systems  Genitourinary:  Negative for dysuria, pelvic pain, vaginal bleeding, vaginal discharge and vaginal pain.    Physical Exam Updated Vital Signs BP 124/82 (BP Location: Left Arm)   Pulse 98   Temp 98.7 F (37.1 C) (Oral)   Resp 16   Ht 5' (1.524 m)   Wt 77.1 kg   LMP 03/01/2022 (Approximate)   SpO2 100%   BMI 33.20 kg/m  Physical Exam Vitals and nursing note reviewed.  Constitutional:      General: She is not in acute distress.    Appearance: She is not  ill-appearing.  HENT:     Head: Atraumatic.  Eyes:     Conjunctiva/sclera: Conjunctivae normal.  Cardiovascular:     Rate and Rhythm: Normal rate and regular rhythm.     Pulses: Normal pulses.     Heart sounds: No murmur heard. Pulmonary:     Effort: Pulmonary effort is normal. No respiratory distress.     Breath sounds: Normal breath sounds.  Abdominal:     General: Abdomen is flat. There is no distension.     Palpations: Abdomen is soft.     Tenderness: There is no abdominal tenderness.  Musculoskeletal:        General: Normal range of motion.     Cervical back: Normal range of motion.  Skin:    General: Skin is warm and dry.     Capillary Refill: Capillary refill takes less than 2 seconds.  Neurological:     General: No focal deficit present.     Mental Status: She is alert.  Psychiatric:        Mood and Affect: Mood normal.     ED Results / Procedures / Treatments   Labs (all labs ordered are listed, but only abnormal results are displayed) Labs Reviewed  WET PREP, GENITAL - Abnormal; Notable for the following components:      Result Value   Clue Cells  Wet Prep HPF POC PRESENT (*)    All other components within normal limits  PREGNANCY, URINE  RPR  HIV ANTIBODY (ROUTINE TESTING W REFLEX)  GC/CHLAMYDIA PROBE AMP ([place]) NOT AT [facility]    EKG None  Radiology No results found.  Procedures Procedures    Medications Ordered in ED Medications - No data to display  ED Course/ Medical Decision Making/ A&P                           Medical Decision Making Amount and/or Complexity of Data Reviewed Labs: ordered.  Risk Prescription drug management.   31 year old female presents emergency department requesting pregnancy test and STD evaluation. Vitals without abnormality. Physical exam was benign. I ordered tests to include pregnancy test, STD screening and wet prep. Results of wet prep concerning for BV. PT given prescription for metronidazole BID x7  days. Advised to complete entire treatment. Pending results of STD screening. Discharged home in stable condition.   Final Clinical Impression(s) / ED Diagnoses Final diagnoses:  Bacterial vaginosis    Rx / DC Orders ED Discharge Orders          Ordered    metroNIDAZOLE (FLAGYL) 500 MG tablet  2 times daily        04/03/22 1620              Tonye Pearson, [place] 04/03/22 1623    Gareth Morgan, MD 04/04/22 2354

## 2022-04-03 NOTE — ED Notes (Signed)
ED Provider at bedside. 

## 2022-04-03 NOTE — Discharge Instructions (Signed)
Your pregnancy test was negative and your wet prep shows evidence of bacterial vaginosis. I have sent you a prescription for an antibiotic that you can you pick - please take twice daily for 7 days and finish the entire course of treatment to prevent recurrence and antibiotic resistance. The rest of your STD tests will return over the next few days. You will receive a call if anything is abnormal.

## 2022-04-03 NOTE — ED Triage Notes (Signed)
Patient states she hand unprotected sex 2 weeks ago and wants a pregnancy test and an STD screening. Patient has no symptoms.

## 2022-04-04 ENCOUNTER — Telehealth (HOSPITAL_COMMUNITY): Payer: Self-pay

## 2022-04-04 LAB — GC/CHLAMYDIA PROBE AMP (~~LOC~~) NOT AT ARMC
Chlamydia: POSITIVE — AB
Comment: NEGATIVE
Comment: NORMAL
Neisseria Gonorrhea: NEGATIVE

## 2022-04-04 LAB — RPR: RPR Ser Ql: NONREACTIVE

## 2022-04-04 MED ORDER — DOXYCYCLINE HYCLATE 100 MG PO CAPS: 100.0000 mg | ORAL_CAPSULE | Freq: Two times a day (BID) | ORAL | 0 refills | Status: AC

## 2022-05-10 DIAGNOSIS — F952 Tourette's disorder: Secondary | ICD-10-CM | POA: Insufficient documentation

## 2022-05-10 DIAGNOSIS — G43009 Migraine without aura, not intractable, without status migrainosus: Secondary | ICD-10-CM

## 2022-05-10 HISTORY — DX: Tourette's disorder: F95.2

## 2022-05-10 HISTORY — DX: Migraine without aura, not intractable, without status migrainosus: G43.009

## 2022-08-12 NOTE — L&D Delivery Note (Signed)
OB/GYN Faculty Practice Delivery Note  Jody Robinson is a 32 y.o. [id number] s/p NSVD at [redacted]w[redacted]d. She was admitted for SROM.   ROM: 14h 78m with clear fluid GBS Status: Negative/-- (08/20 1253) Maximum Maternal Temperature: Temp (24hrs), Avg:98.2 F (36.8 C), Min:98.1 F (36.7 C), Max:98.2 F (36.8 C)   Labor Progress: Initial SVE: 4/50/-2.  No augmentation  required. She then progressed to complete.   Delivery Date/Time: 9/11 0457 Delivery: Called to room and patient was complete and pushing. Head delivered ROA. No nuchal cord present. Shoulder and body delivered in usual fashion. Infant with spontaneous cry, placed on mother's abdomen, dried and stimulated. Cord clamped x 2 after 1-minute delay, and cut by grandma. Cord blood drawn. Placenta delivered spontaneously with gentle cord traction. Fundus firm with massage and Pitocin. Labia, perineum, vagina, and cervix inspected with no lacerations.  Baby Weight: pending  Placenta: 3 vessel, intact. Sent to L&D Complications: None Lacerations: None EBL: 98 mL Anesthesia: epidural  Joanne Gavel, MD [facility] Fellow, [facility] for [facility], [facility] 04/23/2023, 5:15 AM

## 2022-08-14 ENCOUNTER — Emergency Department (HOSPITAL_BASED_OUTPATIENT_CLINIC_OR_DEPARTMENT_OTHER): Payer: 59

## 2022-08-14 ENCOUNTER — Other Ambulatory Visit: Payer: Self-pay

## 2022-08-14 ENCOUNTER — Encounter (HOSPITAL_BASED_OUTPATIENT_CLINIC_OR_DEPARTMENT_OTHER): Payer: Self-pay | Admitting: Emergency Medicine

## 2022-08-14 ENCOUNTER — Emergency Department (HOSPITAL_BASED_OUTPATIENT_CLINIC_OR_DEPARTMENT_OTHER)
Admission: EM | Admit: 2022-08-14 | Discharge: 2022-08-14 | Disposition: A | Discharge status: Home / Self Care | Payer: 59 | Attending: Emergency Medicine | Admitting: Emergency Medicine

## 2022-08-14 DIAGNOSIS — R110 Nausea: Secondary | ICD-10-CM | POA: Diagnosis not present

## 2022-08-14 DIAGNOSIS — O4411 Placenta previa with hemorrhage, first trimester: Secondary | ICD-10-CM | POA: Diagnosis present

## 2022-08-14 DIAGNOSIS — Z3201 Encounter for pregnancy test, result positive: Secondary | ICD-10-CM | POA: Diagnosis not present

## 2022-08-14 DIAGNOSIS — O209 Hemorrhage in early pregnancy, unspecified: Secondary | ICD-10-CM

## 2022-08-14 HISTORY — DX: Tourette's disorder: F95.2

## 2022-08-14 LAB — URINALYSIS, ROUTINE W REFLEX MICROSCOPIC
Bilirubin Urine: NEGATIVE
Glucose, UA: NEGATIVE mg/dL
Ketones, ur: NEGATIVE mg/dL
Nitrite: NEGATIVE
Protein, ur: 30 mg/dL — AB
Specific Gravity, Urine: 1.02 (ref 1.005–1.030)
pH: 6.5 (ref 5.0–8.0)

## 2022-08-14 LAB — CBC WITH DIFFERENTIAL/PLATELET
Abs Immature Granulocytes: 0.04 10*3/uL (ref 0.00–0.07)
Basophils Absolute: 0 10*3/uL (ref 0.0–0.1)
Basophils Relative: 0 %
Eosinophils Absolute: 0 10*3/uL (ref 0.0–0.5)
Eosinophils Relative: 0 %
HCT: 37.8 % (ref 36.0–46.0)
Hemoglobin: 12.3 g/dL (ref 12.0–15.0)
Immature Granulocytes: 0 %
Lymphocytes Relative: 25 %
Lymphs Abs: 2.6 10*3/uL (ref 0.7–4.0)
MCH: 26.3 pg (ref 26.0–34.0)
MCHC: 32.5 g/dL (ref 30.0–36.0)
MCV: 80.9 fL (ref 80.0–100.0)
Monocytes Absolute: 0.7 10*3/uL (ref 0.1–1.0)
Monocytes Relative: 7 %
Neutro Abs: 6.9 10*3/uL (ref 1.7–7.7)
Neutrophils Relative %: 68 %
Platelets: 517 10*3/uL — ABNORMAL HIGH (ref 150–400)
RBC: 4.67 MIL/uL (ref 3.87–5.11)
RDW: 14.5 % (ref 11.5–15.5)
WBC: 10.3 10*3/uL (ref 4.0–10.5)
nRBC: 0 % (ref 0.0–0.2)

## 2022-08-14 LAB — COMPREHENSIVE METABOLIC PANEL
ALT: 11 U/L (ref 0–44)
AST: 19 U/L (ref 15–41)
Albumin: 3.9 g/dL (ref 3.5–5.0)
Alkaline Phosphatase: 91 U/L (ref 38–126)
Anion gap: 11 (ref 5–15)
BUN: 11 mg/dL (ref 6–20)
CO2: 19 mmol/L — ABNORMAL LOW (ref 22–32)
Calcium: 8.8 mg/dL — ABNORMAL LOW (ref 8.9–10.3)
Chloride: 106 mmol/L (ref 98–111)
Creatinine, Ser: 0.74 mg/dL (ref 0.44–1.00)
GFR, Estimated: >60 mL/min (ref >60)
Glucose, Bld: 104 mg/dL — ABNORMAL HIGH (ref 70–99)
Potassium: 3.3 mmol/L — ABNORMAL LOW (ref 3.5–5.1)
Sodium: 136 mmol/L (ref 135–145)
Total Bilirubin: 0.9 mg/dL (ref 0.3–1.2)
Total Protein: 8.4 g/dL — ABNORMAL HIGH (ref 6.5–8.1)

## 2022-08-14 LAB — URINALYSIS, MICROSCOPIC (REFLEX)

## 2022-08-14 LAB — HCG, QUANTITATIVE, PREGNANCY: hCG, Beta Chain, Quant, S: 79 m[IU]/mL — ABNORMAL HIGH (ref <5)

## 2022-08-14 NOTE — Discharge Instructions (Signed)
You are seen in the emergency department today for lower abdominal pain and vaginal bleeding.  As we discussed your blood test for pregnancy today was positive.  However it is not specific enough for [name] to know exactly how far along you are.  The ultrasound that we performed did not show anything significant.  It is too early to be able to see a pregnancy on ultrasound.  At this time it is difficult for [name] to know if you could possibly have miscarried, which is causing bleeding, or if this is normal bleeding in first trimester pregnancy.  It is incredibly important that you see an OB/GYN.  Call them first thing tomorrow and tell them you had a positive blood pregnancy test and need to be reevaluated.  Continue to monitor how you're doing and return to the ER for new or worsening symptoms.

## 2022-08-14 NOTE — ED Provider Notes (Signed)
[facility] EMERGENCY DEPARTMENT Provider Note   CSN: [id number] Arrival date & time: 08/14/22  1502     History  Chief Complaint  Patient presents with   Abdominal Pain    Jody Robinson is a 32 y.o. female G4P2 with history of Tourette's, depression, pancreatitis who presents the emergency department complaining of lower abdominal pain and vaginal bleeding for the past week.  Start date of last menstrual period was 07/22/2022.  For the past week she has had lower abdominal cramping, and small amounts of bleeding.  Complaining of mild nausea, no vomiting.  States that she took a home pregnancy test yesterday which was positive.  Had unprotected sex on 12/17 and 12/22 with different partners.  Follows with [facility] health gynecology.   Abdominal Pain Associated symptoms: nausea and vaginal bleeding        Home Medications Prior to Admission medications   Medication Sig Start Date End Date Taking? Authorizing Provider  fluconazole (DIFLUCAN) 200 MG tablet Take 1 tablet (200 mg total) by mouth daily. Patient not taking: Reported on 01/16/2022 12/05/21   Palumbo, April, MD  metroNIDAZOLE (FLAGYL) 500 MG tablet Take 1 tablet (500 mg total) by mouth 2 (two) times daily. Patient not taking: Reported on 01/16/2022 06/11/21   Caren Macadam, MD  metroNIDAZOLE (FLAGYL) 500 MG tablet Take 1 tablet (500 mg total) by mouth 2 (two) times daily. 04/03/22   Tonye Pearson, PA-C  ondansetron (ZOFRAN-ODT) 4 MG disintegrating tablet Take 1 tablet (4 mg total) by mouth every 8 (eight) hours as needed for nausea or vomiting. 03/07/22   Carlisle Cater, PA-C      Allergies    Patient has no known allergies.    Review of Systems   Review of Systems  Gastrointestinal:  Positive for abdominal pain and nausea.  Genitourinary:  Positive for vaginal bleeding.  All other systems reviewed and are negative.   Physical Exam Updated Vital Signs BP 109/61   Pulse 87   Temp 98.3 F (36.8 C)    Resp 18   Ht 5' (1.524 m)   Wt 73.5 kg   LMP 07/22/2022 (Exact Date)   SpO2 100%   BMI 31.64 kg/m  Physical Exam Vitals and nursing note reviewed.  Constitutional:      Appearance: Normal appearance.  HENT:     Head: Normocephalic and atraumatic.  Eyes:     Conjunctiva/sclera: Conjunctivae normal.  Cardiovascular:     Rate and Rhythm: Normal rate and regular rhythm.  Pulmonary:     Effort: Pulmonary effort is normal. No respiratory distress.     Breath sounds: Normal breath sounds.  Abdominal:     General: There is no distension.     Palpations: Abdomen is soft.     Tenderness: There is no abdominal tenderness.  Skin:    General: Skin is warm and dry.  Neurological:     General: No focal deficit present.     Mental Status: She is alert.     ED Results / Procedures / Treatments   Labs (all labs ordered are listed, but only abnormal results are displayed) Labs Reviewed  URINALYSIS, ROUTINE W REFLEX MICROSCOPIC - Abnormal; Notable for the following components:      Result Value   APPearance CLOUDY (*)    Hgb urine dipstick LARGE (*)    Protein, ur 30 (*)    Leukocytes,Ua MODERATE (*)    All other components within normal limits  CBC WITH DIFFERENTIAL/PLATELET - Abnormal; Notable  for the following components:   Platelets 517 (*)    All other components within normal limits  COMPREHENSIVE METABOLIC PANEL - Abnormal; Notable for the following components:   Potassium 3.3 (*)    CO2 19 (*)    Glucose, Bld 104 (*)    Calcium 8.8 (*)    Total Protein 8.4 (*)    All other components within normal limits  HCG, QUANTITATIVE, PREGNANCY - Abnormal; Notable for the following components:   hCG, Beta Chain, Quant, S 79 (*)    All other components within normal limits  URINALYSIS, MICROSCOPIC (REFLEX) - Abnormal; Notable for the following components:   Bacteria, UA RARE (*)    All other components within normal limits    EKG None  Radiology US OB LESS THAN 14 WEEKS  WITH OB TRANSVAGINAL  Result Date: 08/14/2022 CLINICAL DATA:  [id number] Vaginal bleeding [id number] EXAM: OBSTETRIC <14 WK ULTRASOUND TECHNIQUE: Transabdominal ultrasound was performed for evaluation of the gestation as well as the maternal uterus and adnexal regions. COMPARISON:  None Available. FINDINGS: Intrauterine gestational sac: None Yolk sac:  Not Visualized. Embryo:  Not Visualized. Cardiac Activity: Not Visualized. Maternal uterus/adnexae: Questionable 0.4 x 0.2 x 0.4 cm area of fluid within the endometrium left fundal region that resolved with pressure in movement. Uterus is otherwise unremarkable. Likely corpus luteum cyst within the right ovary. The left ovary is unremarkable. Bilateral adnexa are unremarkable. Other: No free fluid. IMPRESSION: Non-localization of the pregnancy on this scan. No intrauterine gestational sac. No abnormal ovarian or adnexal masses. 1. Differential diagnosis includes intrauterine gestation too early to visualize, spontaneous abortion, or occult ectopic gestation. Recommend close clinical follow-up and serial serum beta-HCG monitoring, with repeat obstetric scan as warranted by beta-HCG levels and clinical assessment. 2.  Likely corpus luteum cyst within the right ovary. Electronically Signed   By: Iven Finn M.D.   On: 08/14/2022 18:49    Procedures Procedures    Medications Ordered in ED Medications - No data to display  ED Course/ Medical Decision Making/ A&P                           Medical Decision Making Amount and/or Complexity of Data Reviewed Labs: ordered. Radiology: ordered.   This patient is a 32 y.o. female  who presents to the ED for concern of lower abdominal pain and vaginal spotting x 1 week.   Differential diagnoses prior to evaluation: The emergent differential diagnosis includes, but is not limited to,  Abnormal uterine bleeding, threatened miscarriage, incomplete miscarriage, normal bleeding from an early trimester pregnancy, ectopic  pregnancy, vaginal/cervical trauma, subchorionic hemorrhage/hematoma. This is not an exhaustive differential.   Past Medical History / Co-morbidities: G4P2; Tourette's, depression, pancreatitis  Additional history: Chart reviewed. Pertinent results include: Patient seen at urgent care on 12/28 for vaginal discharge and odor.  Was treated for bacterial vaginosis and yeast infection.  Physical Exam: Physical exam performed. The pertinent findings include: Normal vital signs.  No acute distress.  Abdomen soft, nontender.  Lab Tests/Imaging studies: I personally interpreted labs/imaging and the pertinent results include: CBC and CMP grossly unremarkable.  Urinalysis with large hemoglobin and moderate leukocytes.  Beta hCG 79.  Transvaginal ultrasound with no intrauterine gestational sac visualized.  Commended correlation with symptoms and serial hCG. I agree with the radiologist interpretation.  Disposition: After consideration of the diagnostic results and the patients response to treatment, I feel that emergency department workup does not suggest an  emergent condition requiring admission or immediate intervention beyond what has been performed at this time. The plan is: Discharged home with recommendation to follow-up with GYN for serial hCG and repeat ultrasound.  Explained to the patient that her results are unequivocal today.  Her cramping and bleeding could be related to first trimester bleeding versus threatened miscarriage.  Overall clinically well-appearing and rest of workup unremarkable.  The patient is safe for discharge and has been instructed to return immediately for worsening symptoms, change in symptoms or any other concerns.  Final Clinical Impression(s) / ED Diagnoses Final diagnoses:  First trimester bleeding  Positive pregnancy test    Rx / DC Orders ED Discharge Orders     None      Portions of this report may have been transcribed using voice recognition software.  Every effort was made to ensure accuracy; however, inadvertent computerized transcription errors may be present.    Estill Cotta 08/14/22 1943    Deno Etienne, DO 08/14/22 2240

## 2022-08-14 NOTE — ED Triage Notes (Signed)
Pt c/o generalized lower abd/pelvic pain x 1 wk; +preg test yesterday and is spotting today

## 2022-08-14 NOTE — ED Notes (Signed)
Discharge paperwork reviewed entirely with patient, including Rx's and follow up care. Pain was under control. Pt verbalized understanding as well as all parties involved. No questions or concerns voiced at the time of discharge. No acute distress noted.   Pt ambulated out to PVA without incident or assistance.  

## 2022-08-16 ENCOUNTER — Ambulatory Visit (INDEPENDENT_AMBULATORY_CARE_PROVIDER_SITE_OTHER): Payer: 59

## 2022-08-16 ENCOUNTER — Other Ambulatory Visit: Payer: Self-pay

## 2022-08-16 VITALS — BP 128/83 | HR 80 | Wt 162.6 lb

## 2022-08-16 DIAGNOSIS — O3680X0 Pregnancy with inconclusive fetal viability, not applicable or unspecified: Secondary | ICD-10-CM

## 2022-08-16 DIAGNOSIS — Z3A00 Weeks of gestation of pregnancy not specified: Secondary | ICD-10-CM

## 2022-08-16 LAB — BETA HCG QUANT (REF LAB): hCG Quant: 196 m[IU]/mL

## 2022-08-16 NOTE — Progress Notes (Signed)
Beta HCG Follow-up Visit  Jody Robinson presents to [facility] for follow-up beta HCG lab. She was seen at [facility] ED on 08/14/22 for abdominal cramping and light bleeding for approx week prior in setting of positive UPT. [name] that day did not confirm IUP. Patient denies pain or bleeding today. Discussed with patient that we are following beta HCG levels today.  Beta HCG results: 08/14/22 1525 79  08/16/22 0833 196   Results and patient history reviewed with Emeterio Reeve, MD, who states this is appropriate rise in HCG and pt should return for [name] in approx 2 weeks. Patient notified via MyChart message.  Annabell Howells 08/16/2022 8:31 AM

## 2022-08-19 ENCOUNTER — Other Ambulatory Visit: Payer: Self-pay

## 2022-08-19 DIAGNOSIS — O3680X0 Pregnancy with inconclusive fetal viability, not applicable or unspecified: Secondary | ICD-10-CM

## 2022-09-02 ENCOUNTER — Ambulatory Visit: Payer: 59

## 2022-09-02 ENCOUNTER — Ambulatory Visit
Admission: RE | Admit: 2022-09-02 | Discharge: 2022-09-02 | Disposition: A | Discharge status: Home / Self Care | Payer: 59 | Source: Ambulatory Visit | Attending: Obstetrics & Gynecology | Admitting: Obstetrics & Gynecology

## 2022-09-02 DIAGNOSIS — O3680X0 Pregnancy with inconclusive fetal viability, not applicable or unspecified: Secondary | ICD-10-CM | POA: Diagnosis not present

## 2022-09-03 ENCOUNTER — Encounter (HOSPITAL_BASED_OUTPATIENT_CLINIC_OR_DEPARTMENT_OTHER): Payer: Self-pay

## 2022-09-03 ENCOUNTER — Other Ambulatory Visit: Payer: Self-pay

## 2022-09-03 ENCOUNTER — Emergency Department (HOSPITAL_BASED_OUTPATIENT_CLINIC_OR_DEPARTMENT_OTHER)
Admission: EM | Admit: 2022-09-03 | Discharge: 2022-09-03 | Disposition: A | Discharge status: Home / Self Care | Payer: 59 | Attending: Emergency Medicine | Admitting: Emergency Medicine

## 2022-09-03 DIAGNOSIS — O219 Vomiting of pregnancy, unspecified: Secondary | ICD-10-CM | POA: Diagnosis not present

## 2022-09-03 DIAGNOSIS — Z3A01 Less than 8 weeks gestation of pregnancy: Secondary | ICD-10-CM | POA: Insufficient documentation

## 2022-09-03 DIAGNOSIS — Z8616 Personal history of COVID-19: Secondary | ICD-10-CM | POA: Insufficient documentation

## 2022-09-03 DIAGNOSIS — R112 Nausea with vomiting, unspecified: Secondary | ICD-10-CM

## 2022-09-03 LAB — COMPREHENSIVE METABOLIC PANEL
ALT: 10 U/L (ref 0–44)
AST: 15 U/L (ref 15–41)
Albumin: 3.8 g/dL (ref 3.5–5.0)
Alkaline Phosphatase: 77 U/L (ref 38–126)
Anion gap: 11 (ref 5–15)
BUN: 10 mg/dL (ref 6–20)
CO2: 22 mmol/L (ref 22–32)
Calcium: 8.5 mg/dL — ABNORMAL LOW (ref 8.9–10.3)
Chloride: 99 mmol/L (ref 98–111)
Creatinine, Ser: 0.58 mg/dL (ref 0.44–1.00)
GFR, Estimated: >60 mL/min (ref >60)
Glucose, Bld: 98 mg/dL (ref 70–99)
Potassium: 3.3 mmol/L — ABNORMAL LOW (ref 3.5–5.1)
Sodium: 132 mmol/L — ABNORMAL LOW (ref 135–145)
Total Bilirubin: 0.9 mg/dL (ref 0.3–1.2)
Total Protein: 7.6 g/dL (ref 6.5–8.1)

## 2022-09-03 LAB — URINALYSIS, ROUTINE W REFLEX MICROSCOPIC
Glucose, UA: NEGATIVE mg/dL
Ketones, ur: 15 mg/dL — AB
Nitrite: NEGATIVE
Protein, ur: 30 mg/dL — AB
Specific Gravity, Urine: >=1.03 (ref 1.005–1.030)
pH: 6 (ref 5.0–8.0)

## 2022-09-03 LAB — CBC
HCT: 35.2 % — ABNORMAL LOW (ref 36.0–46.0)
Hemoglobin: 11.4 g/dL — ABNORMAL LOW (ref 12.0–15.0)
MCH: 26.1 pg (ref 26.0–34.0)
MCHC: 32.4 g/dL (ref 30.0–36.0)
MCV: 80.7 fL (ref 80.0–100.0)
Platelets: 698 10*3/uL — ABNORMAL HIGH (ref 150–400)
RBC: 4.36 MIL/uL (ref 3.87–5.11)
RDW: 14 % (ref 11.5–15.5)
WBC: 13.1 10*3/uL — ABNORMAL HIGH (ref 4.0–10.5)
nRBC: 0 % (ref 0.0–0.2)

## 2022-09-03 LAB — LIPASE, BLOOD: Lipase: 29 U/L (ref 11–51)

## 2022-09-03 LAB — HCG, QUANTITATIVE, PREGNANCY: hCG, Beta Chain, Quant, S: 40821 m[IU]/mL — ABNORMAL HIGH (ref <5)

## 2022-09-03 LAB — URINALYSIS, MICROSCOPIC (REFLEX)

## 2022-09-03 MED ORDER — ONDANSETRON HCL 4 MG/2ML IJ SOLN
4.0000 mg | Freq: Once | INTRAMUSCULAR | Status: AC
  Administered 2022-09-03: 4 mg via INTRAVENOUS
  Filled 2022-09-03: qty 2

## 2022-09-03 MED ORDER — SODIUM CHLORIDE 0.9 % IV BOLUS
1000.0000 mL | Freq: Once | INTRAVENOUS | Status: AC
  Administered 2022-09-03: 1000 mL via INTRAVENOUS

## 2022-09-03 MED ORDER — POTASSIUM CHLORIDE CRYS ER 20 MEQ PO TBCR
20.0000 meq | EXTENDED_RELEASE_TABLET | Freq: Once | ORAL | Status: AC
  Administered 2022-09-03: 20 meq via ORAL
  Filled 2022-09-03: qty 1

## 2022-09-03 MED ORDER — DOXYLAMINE-PYRIDOXINE 10-10 MG PO TBEC: 2.0000 | DELAYED_RELEASE_TABLET | Freq: Every day | ORAL | 0 refills | Status: DC

## 2022-09-03 NOTE — ED Provider Notes (Signed)
[facility] [place] Provider Note   CSN: [id number] Arrival date & time: 09/03/22  1455     History  Chief Complaint  Patient presents with   Emesis    Jody Robinson is a 32 y.o. female.  Mrs. Jody Robinson is a 32 year old female who is about [redacted] weeks pregnant who presents today for evaluation of nausea and vomiting for the past 3 days.  She states her last p.o. intake that she was able to keep down was 2 days ago.  Denies fever, abdominal pain, dysuria, vaginal bleeding, vaginal discharge.  She states she had a visit with her OB yesterday where she underwent OB ultrasound which showed intrauterine pregnancy.  She is concerned regarding dehydration given the amount of emesis she has had.  Denies other complaints at this time.  The history is provided by the patient. No language interpreter was used.       Home Medications Prior to Admission medications   Medication Sig Start Date End Date Taking? Authorizing Provider  fluconazole (DIFLUCAN) 200 MG tablet Take 1 tablet (200 mg total) by mouth daily. Patient not taking: Reported on 01/16/2022 12/05/21   Palumbo, April, MD  metroNIDAZOLE (FLAGYL) 500 MG tablet Take 1 tablet (500 mg total) by mouth 2 (two) times daily. Patient not taking: Reported on 01/16/2022 06/11/21   Caren Macadam, MD  metroNIDAZOLE (FLAGYL) 500 MG tablet Take 1 tablet (500 mg total) by mouth 2 (two) times daily. 04/03/22   Tonye Pearson, PA-C  ondansetron (ZOFRAN-ODT) 4 MG disintegrating tablet Take 1 tablet (4 mg total) by mouth every 8 (eight) hours as needed for nausea or vomiting. 03/07/22   Carlisle Cater, PA-C      Allergies    Patient has no known allergies.    Review of Systems   Review of Systems  Constitutional:  Negative for chills and fever.  Eyes:  Negative for visual disturbance.  Gastrointestinal:  Positive for nausea and vomiting. Negative for abdominal pain.  Genitourinary:  Negative for dysuria, flank  pain, pelvic pain, vaginal bleeding and vaginal discharge.  Neurological:  Negative for light-headedness.  All other systems reviewed and are negative.   Physical Exam Updated Vital Signs BP 117/80 (BP Location: Right Arm)   Pulse 92   Temp 98.4 F (36.9 C) (Oral)   Resp 18   Ht 5' (1.524 m)   Wt 73.3 kg   LMP 07/22/2022 (Exact Date)   SpO2 99%   BMI 31.54 kg/m  Physical Exam Vitals and nursing note reviewed.  Constitutional:      General: She is not in acute distress.    Appearance: Normal appearance. She is not ill-appearing.  HENT:     Head: Normocephalic and atraumatic.     Nose: Nose normal.  Eyes:     General: No scleral icterus.    Extraocular Movements: Extraocular movements intact.     Conjunctiva/sclera: Conjunctivae normal.  Cardiovascular:     Rate and Rhythm: Normal rate and regular rhythm.     Pulses: Normal pulses.  Pulmonary:     Effort: Pulmonary effort is normal. No respiratory distress.     Breath sounds: Normal breath sounds. No wheezing or rales.  Abdominal:     General: There is no distension.     Palpations: Abdomen is soft.     Tenderness: There is no abdominal tenderness. There is no right CVA tenderness, left CVA tenderness or guarding.  Musculoskeletal:  General: Normal range of motion.     Cervical back: Normal range of motion.  Skin:    General: Skin is warm and dry.  Neurological:     General: No focal deficit present.     Mental Status: She is alert. Mental status is at baseline.     ED Results / Procedures / Treatments   Labs (all labs ordered are listed, but only abnormal results are displayed) Labs Reviewed  COMPREHENSIVE METABOLIC PANEL - Abnormal; Notable for the following components:      Result Value   Sodium 132 (*)    Potassium 3.3 (*)    Calcium 8.5 (*)    All other components within normal limits  CBC - Abnormal; Notable for the following components:   WBC 13.1 (*)    Hemoglobin 11.4 (*)    HCT 35.2 (*)     Platelets 698 (*)    All other components within normal limits  URINALYSIS, ROUTINE W REFLEX MICROSCOPIC - Abnormal; Notable for the following components:   Hgb urine dipstick MODERATE (*)    Bilirubin Urine SMALL (*)    Ketones, ur 15 (*)    Protein, ur 30 (*)    Leukocytes,Ua TRACE (*)    All other components within normal limits  HCG, QUANTITATIVE, PREGNANCY - Abnormal; Notable for the following components:   hCG, Beta Chain, Quant, S 40,821 (*)    All other components within normal limits  URINALYSIS, MICROSCOPIC (REFLEX) - Abnormal; Notable for the following components:   Bacteria, UA FEW (*)    All other components within normal limits  LIPASE, BLOOD    EKG None  Radiology US OB Transvaginal  Result Date: 09/02/2022 CLINICAL DATA:  Pregnant EXAM: TRANSVAGINAL OB ULTRASOUND TECHNIQUE: Transvaginal ultrasound was performed for complete evaluation of the gestation as well as the maternal uterus, adnexal regions, and pelvic cul-de-sac. COMPARISON:  None Available. FINDINGS: Intrauterine gestational sac: Single Yolk sac:  Visualized. Embryo:  Visualized. Cardiac Activity: Visualized. Heart Rate: 124 bpm CRL:   0.6 cm = 6 weeks 3 days [name] EDC: 04/25/2023 Subchorionic hemorrhage:  None visualized. Adnexa: No masses or fluid collections. IMPRESSION: Single viable intrauterine pregnancy measuring 6 weeks 3 days with fetal heart motion. Ultrasound [facility] 04/25/2023. Electronically Signed   By: Sammie Bench M.D.   On: 09/02/2022 15:16    Procedures Procedures    Medications Ordered in ED Medications  sodium chloride 0.9 % bolus 1,000 mL (has no administration in time range)  ondansetron (ZOFRAN) injection 4 mg (has no administration in time range)    ED Course/ Medical Decision Making/ A&P Clinical Course as of 09/03/22 1920  Tue Sep 03, 2022  1901 On reevaluation following Zofran, and fluid bolus patient reports improvement in symptoms.  Will p.o. challenge and reevaluate. [AA]     Clinical Course User Index [AA] Evlyn Courier, PA-C                             Medical Decision Making Amount and/or Complexity of Data Reviewed Labs: ordered.  Risk Prescription drug management.   Medical Decision Making / ED Course   This patient presents to the ED for concern of emesis, this involves an extensive number of treatment options, and is a complaint that carries with it a high risk of complications and morbidity.  The differential diagnosis includes emesis related to pregnancy, gastroenteritis  MDM: 32 year old female who is [redacted] weeks pregnant presents today for  evaluation of nausea and vomiting for the past 3 days.  Denies any significant p.o. intake.  Without abdominal pain, dysuria, vaginal bleeding, or vaginal discharge.  She did undergo an OB ultrasound yesterday which showed successful intrauterine pregnancy without any complications.  She is concerned regarding dehydration.  Will provide fluid bolus, dose of Zofran.  Will reevaluate. Given her symptoms have been ongoing for 3 days, she had an OB ultrasound yesterday did not feel she needs to have a repeat ultrasound today.  Patient successfully passed p.o. challenge.  Patient is appropriate for discharge.  Diclegis prescribed.  Return precaution discussed.  Discussed follow-up with PCP.  Patient is appropriate for discharge.  Discharged in stable condition.  Patient voices understanding and is in agreement with plan.   Lab Tests: -I ordered, reviewed, and interpreted labs.   The pertinent results include:   Labs Reviewed  COMPREHENSIVE METABOLIC PANEL - Abnormal; Notable for the following components:      Result Value   Sodium 132 (*)    Potassium 3.3 (*)    Calcium 8.5 (*)    All other components within normal limits  CBC - Abnormal; Notable for the following components:   WBC 13.1 (*)    Hemoglobin 11.4 (*)    HCT 35.2 (*)    Platelets 698 (*)    All other components within normal limits  URINALYSIS,  ROUTINE W REFLEX MICROSCOPIC - Abnormal; Notable for the following components:   Hgb urine dipstick MODERATE (*)    Bilirubin Urine SMALL (*)    Ketones, ur 15 (*)    Protein, ur 30 (*)    Leukocytes,Ua TRACE (*)    All other components within normal limits  HCG, QUANTITATIVE, PREGNANCY - Abnormal; Notable for the following components:   hCG, Beta Chain, Quant, S 40,821 (*)    All other components within normal limits  URINALYSIS, MICROSCOPIC (REFLEX) - Abnormal; Notable for the following components:   Bacteria, UA FEW (*)    All other components within normal limits  LIPASE, BLOOD      EKG  EKG Interpretation  Date/Time:    Ventricular Rate:    PR Interval:    QRS Duration:   QT Interval:    QTC Calculation:   R Axis:     Text Interpretation:           Medicines ordered and prescription drug management: Meds ordered this encounter  Medications   sodium chloride 0.9 % bolus 1,000 mL   ondansetron (ZOFRAN) injection 4 mg   Doxylamine-Pyridoxine 10-10 MG TBEC    Sig: Take 2 tablets by mouth at bedtime. Take 2 tablets at bedtime.  If symptoms persist after 2 days increase to 1 tablet in the morning, and 2 tablets at bedtime.  Take on empty stomach.    Dispense:  60 tablet    Refill:  0    Order Specific Question:   Supervising Provider    Answer:   Sabra Heck, BRIAN [[id number]]    -I have reviewed the patients home medicines and have made adjustments as needed   Reevaluation: After the interventions noted above, I reevaluated the patient and found that they have :improved  Co morbidities that complicate the patient evaluation  Past Medical History:  Diagnosis Date   Abnormal Pap smear of cervix    Anemia in pregnancy 03/01/2020   S/p IV feraheme 6/18 and 02/04/2020  CBC Latest Ref Rng & Units 02/06/2020 01/19/2020 12/21/2019 WBC 4.0 - 10.5 K/uL 17.3(H) 13.8(H) 14.9(H)  Hemoglobin 12.0 - 15.0 g/dL 9.7(L) 8.3(L) 8.5(L) Hematocrit 36 - 46 % 32.7(L) 26.9(L) 26.7(L) Platelets 150 -  400 K/uL 483(H) 472(H) 559(H)     Depression    History of COVID-19 09/10/2019   COVID+ 09/08/19 symptomatic    History of IUFD 02/06/2019   Less than 20 weeks   Tourette's syndrome       Dispostion: Patient is appropriate for discharge.  Discharged in stable condition.  Return precautions discussed.   Final Clinical Impression(s) / ED Diagnoses Final diagnoses:  Nausea and vomiting, unspecified vomiting type    Rx / DC Orders ED Discharge Orders          Ordered    Doxylamine-Pyridoxine 10-10 MG TBEC  Daily at bedtime        09/03/22 1920              Evlyn Courier, PA-C 09/03/22 [address], [place] Skye, DO 09/03/22 2238

## 2022-09-03 NOTE — Discharge Instructions (Signed)
Your workup today was overall reassuring.  You mention he had an ultrasound done yesterday at your [facility] office which showed an intrauterine pregnancy.  You were given some nausea medication as well as fluids in the emergency department with improvement in your symptoms.  You are able to drink water, and eat crackers in the emergency department without difficulty.  I have sent medication called likely just into your pharmacy.  If this medication is too expensive I have also attached a list of medications that she can buy over-the-counter and combine that function essentially the same as diclegis.  For any concerning symptoms return to the emergency department.  Otherwise follow-up with your OB.  Go to a drugstore and buy unisom and vitaminb6(pyridoxine) Take 1/2 a tab of unisom(12.'5mg'$ ) and '25mg'$  of vitamin b6.  Take this at night before bed.  If you continue to have nausea and vomiting take twice a day.  [facility] or [facility]

## 2022-09-03 NOTE — ED Triage Notes (Signed)
Pt reports emesis x 3 days, denies abd pain or diarrhea. She reports "dibbling" when urinating.  She reports she is [redacted] weeks pregnant.

## 2022-09-13 ENCOUNTER — Inpatient Hospital Stay (HOSPITAL_COMMUNITY)
Admission: AD | Admit: 2022-09-13 | Discharge: 2022-09-13 | Disposition: A | Discharge status: Home / Self Care | Payer: 59 | Attending: Obstetrics and Gynecology | Admitting: Obstetrics and Gynecology

## 2022-09-13 ENCOUNTER — Encounter (HOSPITAL_COMMUNITY): Payer: Self-pay

## 2022-09-13 DIAGNOSIS — O99611 Diseases of the digestive system complicating pregnancy, first trimester: Secondary | ICD-10-CM | POA: Diagnosis not present

## 2022-09-13 DIAGNOSIS — Z3A01 Less than 8 weeks gestation of pregnancy: Secondary | ICD-10-CM | POA: Diagnosis not present

## 2022-09-13 DIAGNOSIS — K5900 Constipation, unspecified: Secondary | ICD-10-CM | POA: Insufficient documentation

## 2022-09-13 DIAGNOSIS — O219 Vomiting of pregnancy, unspecified: Secondary | ICD-10-CM

## 2022-09-13 DIAGNOSIS — O26891 Other specified pregnancy related conditions, first trimester: Secondary | ICD-10-CM | POA: Diagnosis present

## 2022-09-13 DIAGNOSIS — O09291 Supervision of pregnancy with other poor reproductive or obstetric history, first trimester: Secondary | ICD-10-CM | POA: Insufficient documentation

## 2022-09-13 DIAGNOSIS — R109 Unspecified abdominal pain: Secondary | ICD-10-CM | POA: Diagnosis present

## 2022-09-13 LAB — URINALYSIS, ROUTINE W REFLEX MICROSCOPIC
Bilirubin Urine: NEGATIVE
Glucose, UA: NEGATIVE mg/dL
Hgb urine dipstick: NEGATIVE
Ketones, ur: 80 mg/dL — AB
Nitrite: NEGATIVE
Protein, ur: 100 mg/dL — AB
Specific Gravity, Urine: 1.024 (ref 1.005–1.030)
pH: 7 (ref 5.0–8.0)

## 2022-09-13 MED ORDER — FAMOTIDINE IN NACL 20-0.9 MG/50ML-% IV SOLN
20.0000 mg | Freq: Once | INTRAVENOUS | Status: AC
  Administered 2022-09-13: 20 mg via INTRAVENOUS
  Filled 2022-09-13: qty 50

## 2022-09-13 MED ORDER — METOCLOPRAMIDE HCL 5 MG/ML IJ SOLN
10.0000 mg | Freq: Once | INTRAMUSCULAR | Status: AC
  Administered 2022-09-13: 10 mg via INTRAVENOUS
  Filled 2022-09-13: qty 2

## 2022-09-13 MED ORDER — DIPHENHYDRAMINE HCL 50 MG/ML IJ SOLN
INTRAMUSCULAR | Status: AC
  Filled 2022-09-13: qty 1

## 2022-09-13 MED ORDER — FAMOTIDINE 20 MG PO TABS: 20.0000 mg | ORAL_TABLET | Freq: Every day | ORAL | 0 refills | Status: DC

## 2022-09-13 MED ORDER — DIPHENHYDRAMINE HCL 50 MG/ML IJ SOLN
25.0000 mg | Freq: Once | INTRAMUSCULAR | Status: AC
  Administered 2022-09-13: 25 mg via INTRAVENOUS

## 2022-09-13 MED ORDER — LACTATED RINGERS IV BOLUS
1000.0000 mL | Freq: Once | INTRAVENOUS | Status: AC
  Administered 2022-09-13: 1000 mL via INTRAVENOUS

## 2022-09-13 MED ORDER — PROMETHAZINE HCL 25 MG PO TABS: 25.0000 mg | ORAL_TABLET | Freq: Four times a day (QID) | ORAL | 2 refills | Status: DC | PRN

## 2022-09-13 NOTE — Discharge Instructions (Signed)

## 2022-09-13 NOTE — MAU Provider Note (Addendum)
History     CSN: [id number]  Arrival date and time: 09/13/22 1544   Event Date/Time   First Provider Initiated Contact with Patient 09/13/22 1654     Chief Complaint  Patient presents with   Emesis   Emesis  Associated symptoms include abdominal pain. Pertinent negatives include no diarrhea, dizziness or fever.   Jody Robinson is a 32 yo F who is G4P2012 at 16w4dwho presented to the MAU with yellow-green emesis. Pt reports that the vomiting began 3 days ago. She says that she hasn't been able to "keep any food or liquid down". She also complains of some nausea, abdominal pain and constipation. She denies ay diarrhea, fever, and dizziness. She reports taking some Miralax at home for a couple of days for constipation which has not helped. She says she has lost weight during this pregnancy but is unsure of how much exactly. She reports that she was able to eat a little yesterday but not at all today.   PMhx - hypertension with last pregnancy  OB History     Gravida  4   Para  2   Term  2   Preterm      AB  1   Living  2      SAB  1   IAB      Ectopic      Multiple  0   Live Births  2          Past Medical History:  Diagnosis Date   Abnormal Pap smear of cervix    Anemia in pregnancy 03/01/2020   S/p IV feraheme 6/18 and 02/04/2020  CBC Latest Ref Rng & Units 02/06/2020 01/19/2020 12/21/2019 WBC 4.0 - 10.5 K/uL 17.3(H) 13.8(H) 14.9(H) Hemoglobin 12.0 - 15.0 g/dL 9.7(L) 8.3(L) 8.5(L) Hematocrit 36 - 46 % 32.7(L) 26.9(L) 26.7(L) Platelets 150 - 400 K/uL 483(H) 472(H) 559(H)     Depression    History of COVID-19 09/10/2019   COVID+ 09/08/19 symptomatic    History of IUFD 02/06/2019   Less than 20 weeks   Tourette's syndrome    Past Surgical History:  Procedure Laterality Date   COLPOSCOPY     Family History  Problem Relation Age of Onset   Diabetes Mother    Social History   Tobacco Use   Smoking status: Never   Smokeless tobacco: Never  Vaping Use    Vaping Use: Never used  Substance Use Topics   Alcohol use: Not Currently    Comment: occasional   Drug use: Not Currently   Allergies: No Known Allergies  Medications Prior to Admission  Medication Sig Dispense Refill Last Dose   topiramate (TOPAMAX) 25 MG tablet Take 1 tab po qhs x 1wk, then 1 tab bid x 1wk, then 1 tab AM, 2 tabs PM x 1wk, then 2 tabs po bid thereafter.      Doxylamine-Pyridoxine 10-10 MG TBEC Take 2 tablets by mouth at bedtime. Take 2 tablets at bedtime.  If symptoms persist after 2 days increase to 1 tablet in the morning, and 2 tablets at bedtime.  Take on empty stomach. 60 tablet 0 Unknown   fluconazole (DIFLUCAN) 200 MG tablet Take 1 tablet (200 mg total) by mouth daily. (Patient not taking: Reported on 01/16/2022) 6 tablet 0 Unknown   Review of Systems  Constitutional:  Negative for fever.       Weight loss  Gastrointestinal:  Positive for abdominal pain, constipation, nausea and vomiting (yellow-green). Negative for diarrhea.  Neurological:  Negative for dizziness.   Physical Exam   Blood pressure (!) 100/57, pulse 75, temperature 98.7 F (37.1 C), temperature source Oral, resp. rate 16, height 5' (1.524 m), weight 71.3 kg, last menstrual period 07/22/2022, SpO2 100 %.  Results for orders placed or performed during the hospital encounter of 09/13/22 (from the past 24 hour(s))  Urinalysis, Routine w reflex microscopic -Urine, Clean Catch     Status: Abnormal   Collection Time: 09/13/22  4:18 PM  Result Value Ref Range   Color, Urine AMBER (A) YELLOW   APPearance CLOUDY (A) CLEAR   Specific Gravity, Urine 1.024 1.005 - 1.030   pH 7.0 5.0 - 8.0   Glucose, UA NEGATIVE NEGATIVE mg/dL   Hgb urine dipstick NEGATIVE NEGATIVE   Bilirubin Urine NEGATIVE NEGATIVE   Ketones, ur 80 (A) NEGATIVE mg/dL   Protein, ur 100 (A) NEGATIVE mg/dL   Nitrite NEGATIVE NEGATIVE   Leukocytes,Ua MODERATE (A) NEGATIVE   RBC / HPF 11-20 0 - 5 RBC/hpf   WBC, UA 0-5 0 - 5 WBC/hpf    Bacteria, UA RARE (A) NONE SEEN   Squamous Epithelial / HPF 21-50 0 - 5 /HPF   Mucus PRESENT    Physical Exam Constitutional:      Comments: Spitting up/ vomiting in room  Cardiovascular:     Rate and Rhythm: Normal rate and regular rhythm.  Pulmonary:     Breath sounds: Normal breath sounds.  Abdominal:     Tenderness: There is no guarding or rebound.    MAU Course  Procedures - none Results for orders placed or performed during the hospital encounter of 09/13/22 (from the past 24 hour(s))  Urinalysis, Routine w reflex microscopic -Urine, Clean Catch     Status: Abnormal   Collection Time: 09/13/22  4:18 PM  Result Value Ref Range   Color, Urine AMBER (A) YELLOW   APPearance CLOUDY (A) CLEAR   Specific Gravity, Urine 1.024 1.005 - 1.030   pH 7.0 5.0 - 8.0   Glucose, UA NEGATIVE NEGATIVE mg/dL   Hgb urine dipstick NEGATIVE NEGATIVE   Bilirubin Urine NEGATIVE NEGATIVE   Ketones, ur 80 (A) NEGATIVE mg/dL   Protein, ur 100 (A) NEGATIVE mg/dL   Nitrite NEGATIVE NEGATIVE   Leukocytes,Ua MODERATE (A) NEGATIVE   RBC / HPF 11-20 0 - 5 RBC/hpf   WBC, UA 0-5 0 - 5 WBC/hpf   Bacteria, UA RARE (A) NONE SEEN   Squamous Epithelial / HPF 21-50 0 - 5 /HPF   Mucus PRESENT     MDM - Famotidine + Metoclopramide for Nausea/Vomiting - LR fluid bolus  Assessment and Plan  Pregnancy-related Emesis - Pt was given Reglan in the MAU. She had a reaction and was given Benedryl. However, pt reported resolved nausea and vomiting. Will dc patient with alternative anti-emetic.  -Pt has not eaten well for the past few days given emesis, so was given LR bolus.  Pregnancy-related Constipation -Pt complained of constipation that has not resolved with Miralax. Advised the pt to try Colace BID along with Miralax.  Given return precautions.  Angelique Holm, [place] 09/13/2022, 8:08 PM    Attestation of Supervision of Student:  I confirm that I have verified the information documented in the medical student's  note and that I have also personally reperformed the history, physical exam and all medical decision making activities.  I have verified that all services and findings are accurately documented in this student's note; and I agree with management and  plan as outlined in the documentation. I have also made any necessary editorial changes.  History Silvana Holecek is a 32 y.o. G5O0370 at 44w4dwho presents with nausea & vomiting. Symptoms started 3 days ago. States she vomits whenever she tries to eat. Was prescribed diclegis but it required a prior authorization so it hasn't been filled yet. Also reports issue with constipation. Has had one dose of stool softener without relief. Denies abdominal pain  Physical exam Patient Vitals for the past 24 hrs:  BP Temp Temp src Pulse Resp SpO2 Height Weight  09/13/22 2001 (!) 100/57 -- Oral 75 16 100 % -- --  09/13/22 1631 131/89 98.7 F (37.1 C) Oral 100 16 100 % 5' (1.524 m) 71.3 kg  09/13/22 1630 -- -- -- -- -- 100 % -- --   Physical Examination: General appearance - alert, well appearing, and in no distress Mental status - normal mood, behavior, speech, dress, motor activity, and thought processes Eyes - sclera anicteric Chest - normal respiratory effort Abdomen - soft, nontender, nondistended, no masses or organomegaly   MDM Treatment in MAU included IV fluids, reglan, & pepcid. Patient had extrapyramidal symptoms after reglan so was given benadryl. Reports improvement in all symptoms & ready for discharge home.   Assessment/Plan 1. Nausea and vomiting during pregnancy prior to [redacted] weeks gestation  -Rx phenergan & pepcid -Reviewed reasons to return to MAU  2. Constipation during pregnancy in first trimester  -Discussed management of constipation at home including OTC meds  3. [redacted] weeks gestation of pregnancy     EJorje Guild NCollyerfor [employer] CConstantine2/09/2022 8:09 PM

## 2022-09-27 ENCOUNTER — Emergency Department (HOSPITAL_BASED_OUTPATIENT_CLINIC_OR_DEPARTMENT_OTHER)
Admission: EM | Admit: 2022-09-27 | Discharge: 2022-09-27 | Disposition: A | Discharge status: Home / Self Care | Payer: 59 | Attending: Emergency Medicine | Admitting: Emergency Medicine

## 2022-09-27 ENCOUNTER — Other Ambulatory Visit: Payer: Self-pay

## 2022-09-27 ENCOUNTER — Encounter (HOSPITAL_BASED_OUTPATIENT_CLINIC_OR_DEPARTMENT_OTHER): Payer: Self-pay | Admitting: Emergency Medicine

## 2022-09-27 DIAGNOSIS — O219 Vomiting of pregnancy, unspecified: Secondary | ICD-10-CM | POA: Diagnosis present

## 2022-09-27 DIAGNOSIS — E875 Hyperkalemia: Secondary | ICD-10-CM | POA: Diagnosis not present

## 2022-09-27 DIAGNOSIS — D72829 Elevated white blood cell count, unspecified: Secondary | ICD-10-CM | POA: Insufficient documentation

## 2022-09-27 DIAGNOSIS — Z3A09 9 weeks gestation of pregnancy: Secondary | ICD-10-CM | POA: Diagnosis not present

## 2022-09-27 DIAGNOSIS — E871 Hypo-osmolality and hyponatremia: Secondary | ICD-10-CM | POA: Insufficient documentation

## 2022-09-27 LAB — CBC WITH DIFFERENTIAL/PLATELET
Abs Immature Granulocytes: 0.04 10*3/uL (ref 0.00–0.07)
Basophils Absolute: 0 10*3/uL (ref 0.0–0.1)
Basophils Relative: 0 %
Eosinophils Absolute: 0 10*3/uL (ref 0.0–0.5)
Eosinophils Relative: 0 %
HCT: 40.5 % (ref 36.0–46.0)
Hemoglobin: 13.4 g/dL (ref 12.0–15.0)
Immature Granulocytes: 0 %
Lymphocytes Relative: 13 %
Lymphs Abs: 1.6 10*3/uL (ref 0.7–4.0)
MCH: 26.8 pg (ref 26.0–34.0)
MCHC: 33.1 g/dL (ref 30.0–36.0)
MCV: 81 fL (ref 80.0–100.0)
Monocytes Absolute: 0.6 10*3/uL (ref 0.1–1.0)
Monocytes Relative: 4 %
Neutro Abs: 10.7 10*3/uL — ABNORMAL HIGH (ref 1.7–7.7)
Neutrophils Relative %: 83 %
Platelets: 663 10*3/uL — ABNORMAL HIGH (ref 150–400)
RBC: 5 MIL/uL (ref 3.87–5.11)
RDW: 13.9 % (ref 11.5–15.5)
WBC: 12.9 10*3/uL — ABNORMAL HIGH (ref 4.0–10.5)
nRBC: 0 % (ref 0.0–0.2)

## 2022-09-27 LAB — URINALYSIS, ROUTINE W REFLEX MICROSCOPIC
Glucose, UA: NEGATIVE mg/dL
Ketones, ur: >=80 mg/dL — AB
Leukocytes,Ua: NEGATIVE
Nitrite: NEGATIVE
Protein, ur: 100 mg/dL — AB
Specific Gravity, Urine: >=1.03 (ref 1.005–1.030)
pH: 5.5 (ref 5.0–8.0)

## 2022-09-27 LAB — COMPREHENSIVE METABOLIC PANEL
ALT: 27 U/L (ref 0–44)
AST: 26 U/L (ref 15–41)
Albumin: 4.3 g/dL (ref 3.5–5.0)
Alkaline Phosphatase: 89 U/L (ref 38–126)
Anion gap: 14 (ref 5–15)
BUN: 11 mg/dL (ref 6–20)
CO2: 21 mmol/L — ABNORMAL LOW (ref 22–32)
Calcium: 9.1 mg/dL (ref 8.9–10.3)
Chloride: 95 mmol/L — ABNORMAL LOW (ref 98–111)
Creatinine, Ser: 0.78 mg/dL (ref 0.44–1.00)
GFR, Estimated: >60 mL/min (ref >60)
Glucose, Bld: 87 mg/dL (ref 70–99)
Potassium: 3.6 mmol/L (ref 3.5–5.1)
Sodium: 130 mmol/L — ABNORMAL LOW (ref 135–145)
Total Bilirubin: 2.1 mg/dL — ABNORMAL HIGH (ref 0.3–1.2)
Total Protein: 9.2 g/dL — ABNORMAL HIGH (ref 6.5–8.1)

## 2022-09-27 LAB — URINALYSIS, MICROSCOPIC (REFLEX)

## 2022-09-27 LAB — MAGNESIUM: Magnesium: 2.4 mg/dL (ref 1.7–2.4)

## 2022-09-27 MED ORDER — METOCLOPRAMIDE HCL 5 MG/ML IJ SOLN
10.0000 mg | Freq: Once | INTRAMUSCULAR | Status: AC
  Administered 2022-09-27: 10 mg via INTRAVENOUS
  Filled 2022-09-27: qty 2

## 2022-09-27 MED ORDER — SODIUM CHLORIDE 0.9 % IV BOLUS
1000.0000 mL | Freq: Once | INTRAVENOUS | Status: AC
  Administered 2022-09-27: 1000 mL via INTRAVENOUS

## 2022-09-27 MED ORDER — DEXTROSE 5 % AND 0.9 % NACL IV BOLUS
1000.0000 mL | Freq: Once | INTRAVENOUS | Status: AC
  Administered 2022-09-27: 1000 mL via INTRAVENOUS

## 2022-09-27 MED ORDER — ONDANSETRON 4 MG PO TBDP: 4.0000 mg | ORAL_TABLET | Freq: Three times a day (TID) | ORAL | 0 refills | Status: DC | PRN

## 2022-09-27 MED ORDER — METOCLOPRAMIDE HCL 10 MG PO TABS: 10.0000 mg | ORAL_TABLET | Freq: Four times a day (QID) | ORAL | 0 refills | Status: DC | PRN

## 2022-09-27 NOTE — Discharge Instructions (Addendum)
As discussed, we will send in 2 additional medications to take as needed for nausea/vomiting.  When is called Zofran to take every 8 hours as needed and what is called Reglan to take every 6 hours as needed.  Recommend oral hydration be electrolyte rich fluids such as sugar-free Gatorade, body armor, liquid IV, Pedialyte.  [facility] with if symptoms return.  Please see OB/GYN injuries convenience next week for reevaluation.  Please do not hesitate to return to emergency department for worrisome signs and symptoms we discussed become apparent.

## 2022-09-27 NOTE — ED Triage Notes (Signed)
10 wks preg , morning sickness and emesis since yesterday , lower back  pain . Denies vaginal bleeding . Reports lightheaded and lethargic .

## 2022-09-27 NOTE — ED Provider Notes (Signed)
[facility] Provider Note   CSN: [id number] Arrival date & time: 09/27/22  1024     History  Chief Complaint  Patient presents with   Emesis    10 wks preg     Jody Robinson is a 32 y.o. female.   Emesis   32 year old female presents emergency department with complaints of nausea, vomiting.  Patient approximately 9 weeks and 4 days pregnant.  G4P2. patient states that symptoms been present over the past 2 to 3 days.  Reports vomiting throughout the day with some worsening of symptoms in the am.  Was recently seen in late January and prescribed Diclegis which she says has been helping some.  Was most recently seen on 09/13/2022 at MAU and given Phenergan of which she also states has helped some.  Denies fever, chest pain, shortness of breath, abdominal pain, urinary/vaginal symptoms, change in bowel habits, hematemesis.  Patient reports smoking weed the day before symptom onset.  Past medical history significant for Tourette's disease, agree otitis, migraine  Home Medications Prior to Admission medications   Medication Sig Start Date End Date Taking? Authorizing Provider  metoCLOPramide (REGLAN) 10 MG tablet Take 1 tablet (10 mg total) by mouth every 6 (six) hours as needed for nausea or vomiting. 09/27/22  Yes Dion Saucier A, PA  ondansetron (ZOFRAN-ODT) 4 MG disintegrating tablet Take 1 tablet (4 mg total) by mouth every 8 (eight) hours as needed for nausea or vomiting. 09/27/22  Yes Dion Saucier A, PA  famotidine (PEPCID) 20 MG tablet Take 1 tablet (20 mg total) by mouth daily. 09/13/22 10/13/22  Jorje Guild, NP  promethazine (PHENERGAN) 25 MG tablet Take 1 tablet (25 mg total) by mouth every 6 (six) hours as needed for nausea or vomiting. 09/13/22   Jorje Guild, NP      Allergies    Patient has no known allergies.    Review of Systems   Review of Systems  Gastrointestinal:  Positive for vomiting.  All other systems  reviewed and are negative.   Physical Exam Updated Vital Signs BP 104/82   Pulse (!) 113   Temp 98.6 F (37 C) (Oral)   Resp 16   Wt 71.2 kg   LMP 07/22/2022 (Exact Date)   SpO2 98%   BMI 30.66 kg/m  Physical Exam Vitals and nursing note reviewed.  Constitutional:      General: She is not in acute distress.    Appearance: She is well-developed.  HENT:     Head: Normocephalic and atraumatic.  Eyes:     Conjunctiva/sclera: Conjunctivae normal.  Cardiovascular:     Rate and Rhythm: Normal rate and regular rhythm.     Heart sounds: No murmur heard. Pulmonary:     Effort: Pulmonary effort is normal. No respiratory distress.     Breath sounds: Normal breath sounds. No wheezing, rhonchi or rales.  Abdominal:     Palpations: Abdomen is soft.     Tenderness: There is no abdominal tenderness. There is no right CVA tenderness, left CVA tenderness, guarding or rebound.  Musculoskeletal:        General: No swelling.     Cervical back: Neck supple. No rigidity or tenderness.     Right lower leg: No edema.     Left lower leg: No edema.  Skin:    General: Skin is warm and dry.     Capillary Refill: Capillary refill takes less than 2 seconds.  Neurological:  Mental Status: She is alert.  Psychiatric:        Mood and Affect: Mood normal.     ED Results / Procedures / Treatments   Labs (all labs ordered are listed, but only abnormal results are displayed) Labs Reviewed  COMPREHENSIVE METABOLIC PANEL - Abnormal; Notable for the following components:      Result Value   Sodium 130 (*)    Chloride 95 (*)    CO2 21 (*)    Total Protein 9.2 (*)    Total Bilirubin 2.1 (*)    All other components within normal limits  CBC WITH DIFFERENTIAL/PLATELET - Abnormal; Notable for the following components:   WBC 12.9 (*)    Platelets 663 (*)    Neutro Abs 10.7 (*)    All other components within normal limits  URINALYSIS, ROUTINE W REFLEX MICROSCOPIC - Abnormal; Notable for the  following components:   APPearance HAZY (*)    Hgb urine dipstick MODERATE (*)    Bilirubin Urine MODERATE (*)    Ketones, ur >=80 (*)    Protein, ur 100 (*)    All other components within normal limits  URINALYSIS, MICROSCOPIC (REFLEX) - Abnormal; Notable for the following components:   Bacteria, UA MANY (*)    All other components within normal limits  MAGNESIUM    EKG None  Radiology No results found.  Procedures Procedures    Medications Ordered in ED Medications  sodium chloride 0.9 % bolus 1,000 mL (0 mLs Intravenous Stopped 09/27/22 1333)  metoCLOPramide (REGLAN) injection 10 mg (10 mg Intravenous Given 09/27/22 1137)  dextrose 5 % and 0.9% NaCl 5-0.9 % bolus 1,000 mL (0 mLs Intravenous Stopped 09/27/22 1333)    ED Course/ Medical Decision Making/ A&P Clinical Course as of 09/27/22 1454  Fri Sep 27, 2022  1240 Consulted MAU APP regarding the patient.  Pending p.o. trial.  As long as passed p.o. trial, recommendation is to send home with additional antiemetics in the form of Zofran and Reglan and additional refill of Phenergan to try serially rest antiemetics.  To call back if feeling of p.o. challenge. [CR]  1319 Reevaluation of the patient showed patient tolerating p.o. > 30 minutes.  Patient seems safe for discharge.  Will send an antiemetic. [CR]    Clinical Course User Index [CR] Wilnette Kales, PA                             Medical Decision Making Amount and/or Complexity of Data Reviewed Labs: ordered.  Risk Prescription drug management.   This patient presents to the ED for concern of nausea, vomiting, this involves an extensive number of treatment options, and is a complaint that carries with it a high risk of complications and morbidity.  The differential diagnosis includes hyperemesis cannabinoid syndrome, hyperemesis gravidarum, morning sickness   Co morbidities that complicate the patient evaluation  See HPI   Additional history  obtained:  Additional history obtained from EMR External records from outside source obtained and reviewed including hospital records   Lab Tests:  I Ordered, and personally interpreted labs.  The pertinent results include: Leukocytosis of 12.9.  No evidence of anemia.  Increase in platelets of 663.  Electrolyte abnormalities with hyponatremia, hyperkalemia and decreased bicarb of 30, 95 and 21 respectively.  No transaminitis.  No renal dysfunction.  UA significant for moderate hemoglobin, moderate bilirubin, greater than 80 ketones 100 proteins.  Magnesium within normal limits.  Imaging Studies ordered:  N/a   Cardiac Monitoring: / EKG:  The patient was maintained on a cardiac monitor.  I personally viewed and interpreted the cardiac monitored which showed an underlying rhythm of: Sinus rhythm   Consultations Obtained:  I requested consultation with attending physician Dr. Nechama Guard is in agreement with treatment plan going forward.   Problem List / ED Course / Critical interventions / Medication management  Nausea/vomiting of pregnancy I ordered medication including Reglan, 1 L normal saline, 1 L of 5% dextrose and normal saline   Reevaluation of the patient after these medicines showed that the patient improved I have reviewed the patients home medicines and have made adjustments as needed   Social Determinants of Health:  Denies tobacco,   Test / Admission - Considered:  Nausea and vomiting in pregnancy Vitals signs significant for tachycardia with a heart rate of 113 initially of this decreased with time elapsed and medicines administered while emergency department. Otherwise within normal range and stable throughout visit. Laboratory studies significant for: See above Patient overall with symptoms most consistent with hyperemesis gravidarum given current presentation and laboratory findings.  Patient noted resolution of symptoms with administration of Reglan while  emergency department.  Contacted MAU APP regarding the patient given her multiple ER visits for the same symptoms and she recommended changing antiemetic outpatient given that she is tolerating p.o. well emergency department with close follow-up outpatient with OB/GYN.  Patient tolerated p.o. without difficulty and observed for 30 to 45 minutes without repeat bouts of emesis.  Will send in additional antiemetic to use as needed.  Fetal heart tones within normal limits.  Treatment plan discussed at length with patient and she acknowledged understanding was agreeable to said plan. Worrisome signs and symptoms were discussed with the patient, and the patient acknowledged understanding to return to the ED if noticed. Patient was stable upon discharge.          Final Clinical Impression(s) / ED Diagnoses Final diagnoses:  Nausea and vomiting in pregnancy    Rx / DC Orders ED Discharge Orders          Ordered    ondansetron (ZOFRAN-ODT) 4 MG disintegrating tablet  Every 8 hours PRN        09/27/22 1318    metoCLOPramide (REGLAN) 10 MG tablet  Every 6 hours PRN        09/27/22 1318              Wilnette Kales, [place] 09/27/22 1454    Elgie Congo, MD 09/27/22 1512

## 2022-09-30 ENCOUNTER — Other Ambulatory Visit: Payer: Self-pay | Admitting: Family Medicine

## 2022-09-30 DIAGNOSIS — O210 Mild hyperemesis gravidarum: Secondary | ICD-10-CM

## 2022-09-30 NOTE — Progress Notes (Signed)
Patient was been seen in ERx3 due to hyperemesis.  Will set up twice weekly IV infusions to help with HG and hopefully avoid ER visits.

## 2022-10-02 ENCOUNTER — Other Ambulatory Visit: Payer: Self-pay

## 2022-10-02 ENCOUNTER — Inpatient Hospital Stay (HOSPITAL_COMMUNITY)
Admission: AD | Admit: 2022-10-02 | Discharge: 2022-10-04 | Disposition: A | Discharge status: Home / Self Care | Payer: 59 | Attending: Obstetrics & Gynecology | Admitting: Obstetrics & Gynecology

## 2022-10-02 ENCOUNTER — Encounter (HOSPITAL_COMMUNITY): Payer: Self-pay | Admitting: Obstetrics and Gynecology

## 2022-10-02 DIAGNOSIS — O210 Mild hyperemesis gravidarum: Secondary | ICD-10-CM

## 2022-10-02 DIAGNOSIS — O211 Hyperemesis gravidarum with metabolic disturbance: Secondary | ICD-10-CM | POA: Insufficient documentation

## 2022-10-02 DIAGNOSIS — N179 Acute kidney failure, unspecified: Secondary | ICD-10-CM | POA: Insufficient documentation

## 2022-10-02 DIAGNOSIS — O2301 Infections of kidney in pregnancy, first trimester: Secondary | ICD-10-CM | POA: Insufficient documentation

## 2022-10-02 DIAGNOSIS — O26831 Pregnancy related renal disease, first trimester: Secondary | ICD-10-CM | POA: Diagnosis not present

## 2022-10-02 DIAGNOSIS — Z3A10 10 weeks gestation of pregnancy: Secondary | ICD-10-CM | POA: Insufficient documentation

## 2022-10-02 LAB — CBC WITH DIFFERENTIAL/PLATELET
Abs Immature Granulocytes: 0.13 10*3/uL — ABNORMAL HIGH (ref 0.00–0.07)
Abs Immature Granulocytes: 0.1 10*3/uL — ABNORMAL HIGH (ref 0.00–0.07)
Basophils Absolute: 0.1 10*3/uL (ref 0.0–0.1)
Basophils Absolute: 0 10*3/uL (ref 0.0–0.1)
Basophils Relative: 0 %
Basophils Relative: 0 %
Eosinophils Absolute: 0 10*3/uL (ref 0.0–0.5)
Eosinophils Absolute: 0 10*3/uL (ref 0.0–0.5)
Eosinophils Relative: 0 %
Eosinophils Relative: 0 %
HCT: 45 % (ref 36.0–46.0)
HCT: 36.1 % (ref 36.0–46.0)
Hemoglobin: 15.9 g/dL — ABNORMAL HIGH (ref 12.0–15.0)
Hemoglobin: 12.2 g/dL (ref 12.0–15.0)
Immature Granulocytes: 1 %
Immature Granulocytes: 1 %
Lymphocytes Relative: 9 %
Lymphocytes Relative: 10 %
Lymphs Abs: 1.9 10*3/uL (ref 0.7–4.0)
Lymphs Abs: 1.7 10*3/uL (ref 0.7–4.0)
MCH: 27.7 pg (ref 26.0–34.0)
MCH: 26.9 pg (ref 26.0–34.0)
MCHC: 35.3 g/dL (ref 30.0–36.0)
MCHC: 33.8 g/dL (ref 30.0–36.0)
MCV: 78.4 fL — ABNORMAL LOW (ref 80.0–100.0)
MCV: 79.5 fL — ABNORMAL LOW (ref 80.0–100.0)
Monocytes Absolute: 1.2 10*3/uL — ABNORMAL HIGH (ref 0.1–1.0)
Monocytes Absolute: 1.1 10*3/uL — ABNORMAL HIGH (ref 0.1–1.0)
Monocytes Relative: 6 %
Monocytes Relative: 6 %
Neutro Abs: 18.2 10*3/uL — ABNORMAL HIGH (ref 1.7–7.7)
Neutro Abs: 15 10*3/uL — ABNORMAL HIGH (ref 1.7–7.7)
Neutrophils Relative %: 84 %
Neutrophils Relative %: 83 %
Platelets: 851 10*3/uL — ABNORMAL HIGH (ref 150–400)
RBC: 5.74 MIL/uL — ABNORMAL HIGH (ref 3.87–5.11)
RBC: 4.54 MIL/uL (ref 3.87–5.11)
RDW: 14.1 % (ref 11.5–15.5)
RDW: 13.9 % (ref 11.5–15.5)
WBC: 21.5 10*3/uL — ABNORMAL HIGH (ref 4.0–10.5)
WBC: 18 10*3/uL — ABNORMAL HIGH (ref 4.0–10.5)
nRBC: 0 % (ref 0.0–0.2)
nRBC: 0 % (ref 0.0–0.2)

## 2022-10-02 LAB — COMPREHENSIVE METABOLIC PANEL
ALT: 53 U/L — ABNORMAL HIGH (ref 0–44)
ALT: 50 U/L — ABNORMAL HIGH (ref 0–44)
AST: 54 U/L — ABNORMAL HIGH (ref 15–41)
AST: 60 U/L — ABNORMAL HIGH (ref 15–41)
Albumin: 4.4 g/dL (ref 3.5–5.0)
Albumin: 3.5 g/dL (ref 3.5–5.0)
Alkaline Phosphatase: 113 U/L (ref 38–126)
Alkaline Phosphatase: 83 U/L (ref 38–126)
Anion gap: 22 — ABNORMAL HIGH (ref 5–15)
Anion gap: 19 — ABNORMAL HIGH (ref 5–15)
BUN: 43 mg/dL — ABNORMAL HIGH (ref 6–20)
BUN: 40 mg/dL — ABNORMAL HIGH (ref 6–20)
CO2: 17 mmol/L — ABNORMAL LOW (ref 22–32)
CO2: 19 mmol/L — ABNORMAL LOW (ref 22–32)
Calcium: 9.6 mg/dL (ref 8.9–10.3)
Calcium: 8.9 mg/dL (ref 8.9–10.3)
Chloride: 93 mmol/L — ABNORMAL LOW (ref 98–111)
Chloride: 99 mmol/L (ref 98–111)
Creatinine, Ser: 3.16 mg/dL — ABNORMAL HIGH (ref 0.44–1.00)
Creatinine, Ser: 2.58 mg/dL — ABNORMAL HIGH (ref 0.44–1.00)
GFR, Estimated: 19 mL/min — ABNORMAL LOW (ref >60)
GFR, Estimated: 25 mL/min — ABNORMAL LOW (ref >60)
Glucose, Bld: 141 mg/dL — ABNORMAL HIGH (ref 70–99)
Glucose, Bld: 175 mg/dL — ABNORMAL HIGH (ref 70–99)
Potassium: 3.1 mmol/L — ABNORMAL LOW (ref 3.5–5.1)
Potassium: 2.7 mmol/L — CL (ref 3.5–5.1)
Sodium: 132 mmol/L — ABNORMAL LOW (ref 135–145)
Sodium: 137 mmol/L (ref 135–145)
Total Bilirubin: 4 mg/dL — ABNORMAL HIGH (ref 0.3–1.2)
Total Bilirubin: 3 mg/dL — ABNORMAL HIGH (ref 0.3–1.2)
Total Protein: 9.5 g/dL — ABNORMAL HIGH (ref 6.5–8.1)
Total Protein: 7.4 g/dL (ref 6.5–8.1)

## 2022-10-02 LAB — URINALYSIS, MICROSCOPIC (REFLEX)

## 2022-10-02 LAB — TYPE AND SCREEN
ABO/RH(D): O POS
Antibody Screen: NEGATIVE

## 2022-10-02 LAB — URINALYSIS, ROUTINE W REFLEX MICROSCOPIC
Glucose, UA: 100 mg/dL — AB
Ketones, ur: NEGATIVE mg/dL
Nitrite: POSITIVE — AB
Protein, ur: 30 mg/dL — AB
Specific Gravity, Urine: >1.03 — ABNORMAL HIGH (ref 1.005–1.030)
pH: 5 (ref 5.0–8.0)

## 2022-10-02 LAB — TSH: TSH: 0.06 u[IU]/mL — ABNORMAL LOW (ref 0.350–4.500)

## 2022-10-02 MED ORDER — SODIUM CHLORIDE 0.9 % IV SOLN
25.0000 mg | Freq: Once | INTRAVENOUS | Status: AC
  Administered 2022-10-02: 25 mg via INTRAVENOUS
  Filled 2022-10-02: qty 25

## 2022-10-02 MED ORDER — SCOPOLAMINE 1 MG/3DAYS TD PT72: 1.0000 | MEDICATED_PATCH | TRANSDERMAL | Status: DC

## 2022-10-02 MED ORDER — ONDANSETRON 4 MG PO TBDP: 4.0000 mg | ORAL_TABLET | Freq: Four times a day (QID) | ORAL | Status: DC | PRN

## 2022-10-02 MED ORDER — SODIUM CHLORIDE 0.9 % IV SOLN
25.0000 mg | Freq: Once | INTRAVENOUS | Status: DC
  Filled 2022-10-02: qty 1

## 2022-10-02 MED ORDER — PANTOPRAZOLE SODIUM 40 MG IV SOLR
40.0000 mg | INTRAVENOUS | Status: DC
  Administered 2022-10-02 – 2022-10-03 (×2): 40 mg via INTRAVENOUS
  Filled 2022-10-02 (×2): qty 10

## 2022-10-02 MED ORDER — ZOLPIDEM TARTRATE 5 MG PO TABS
5.0000 mg | ORAL_TABLET | Freq: Every evening | ORAL | Status: DC | PRN
  Administered 2022-10-02 – 2022-10-03 (×2): 5 mg via ORAL
  Filled 2022-10-02 (×2): qty 1

## 2022-10-02 MED ORDER — ONDANSETRON HCL 4 MG/2ML IJ SOLN
4.0000 mg | Freq: Once | INTRAMUSCULAR | Status: AC
  Administered 2022-10-02: 4 mg via INTRAVENOUS
  Filled 2022-10-02: qty 2

## 2022-10-02 MED ORDER — INFUVITE ADULT IV SOLN
Freq: Once | INTRAVENOUS | Status: AC
  Filled 2022-10-02: qty 10

## 2022-10-02 MED ORDER — POTASSIUM CHLORIDE 10 MEQ/100ML IV SOLN
10.0000 meq | INTRAVENOUS | Status: DC
  Filled 2022-10-02 (×4): qty 100

## 2022-10-02 MED ORDER — PYRIDOXINE HCL 100 MG/ML IJ SOLN
100.0000 mg | Freq: Every day | INTRAMUSCULAR | Status: DC
  Administered 2022-10-02 – 2022-10-04 (×3): 100 mg via INTRAVENOUS
  Filled 2022-10-02 (×3): qty 1

## 2022-10-02 MED ORDER — POTASSIUM CHLORIDE 10 MEQ/100ML IV SOLN
10.0000 meq | INTRAVENOUS | Status: AC
  Administered 2022-10-02 – 2022-10-03 (×4): 10 meq via INTRAVENOUS

## 2022-10-02 MED ORDER — SCOPOLAMINE 1 MG/3DAYS TD PT72
1.0000 | MEDICATED_PATCH | TRANSDERMAL | Status: DC
  Administered 2022-10-02: 1.5 mg via TRANSDERMAL
  Filled 2022-10-02: qty 1

## 2022-10-02 MED ORDER — DOCUSATE SODIUM 100 MG PO CAPS
100.0000 mg | ORAL_CAPSULE | Freq: Every day | ORAL | Status: DC
  Administered 2022-10-03 – 2022-10-04 (×2): 100 mg via ORAL
  Filled 2022-10-02 (×2): qty 1

## 2022-10-02 MED ORDER — PRENATAL MULTIVITAMIN CH
1.0000 | ORAL_TABLET | Freq: Every day | ORAL | Status: DC
  Administered 2022-10-03 – 2022-10-04 (×2): 1 via ORAL
  Filled 2022-10-02 (×2): qty 1

## 2022-10-02 MED ORDER — LACTATED RINGERS IV BOLUS
1000.0000 mL | Freq: Once | INTRAVENOUS | Status: AC
  Administered 2022-10-02: 1000 mL via INTRAVENOUS

## 2022-10-02 MED ORDER — SODIUM CHLORIDE 0.9 % IV SOLN
2.0000 g | INTRAVENOUS | Status: DC
  Administered 2022-10-02 – 2022-10-03 (×2): 2 g via INTRAVENOUS
  Filled 2022-10-02 (×2): qty 20

## 2022-10-02 MED ORDER — PANTOPRAZOLE SODIUM 40 MG IV SOLR: 40.0000 mg | INTRAVENOUS | Status: DC

## 2022-10-02 MED ORDER — SODIUM CHLORIDE 0.9 % IV SOLN: 25.0000 mg | Freq: Once | INTRAVENOUS | Status: DC

## 2022-10-02 MED ORDER — CALCIUM CARBONATE ANTACID 500 MG PO CHEW: 2.0000 | CHEWABLE_TABLET | ORAL | Status: DC | PRN

## 2022-10-02 MED ORDER — ONDANSETRON HCL 4 MG/2ML IJ SOLN: 4.0000 mg | INTRAMUSCULAR | Status: DC

## 2022-10-02 MED ORDER — SODIUM CHLORIDE 0.9 % IV SOLN: 12.5000 mg | INTRAVENOUS | Status: DC

## 2022-10-02 MED ORDER — SODIUM CHLORIDE 0.9 % IV SOLN
25.0000 mg | INTRAVENOUS | Status: DC
  Administered 2022-10-02 – 2022-10-04 (×5): 25 mg via INTRAVENOUS
  Filled 2022-10-02 (×5): qty 1

## 2022-10-02 MED ORDER — LACTATED RINGERS IV SOLN: 125.0000 mL/h | INTRAVENOUS | Status: AC

## 2022-10-02 MED ORDER — ONDANSETRON 4 MG PO TBDP
4.0000 mg | ORAL_TABLET | Freq: Four times a day (QID) | ORAL | Status: DC | PRN
  Administered 2022-10-02 – 2022-10-04 (×3): 4 mg via ORAL
  Filled 2022-10-02 (×3): qty 1

## 2022-10-02 MED ORDER — ACETAMINOPHEN 325 MG PO TABS
650.0000 mg | ORAL_TABLET | ORAL | Status: DC | PRN
  Administered 2022-10-02: 650 mg via ORAL
  Filled 2022-10-02: qty 2

## 2022-10-02 MED ORDER — LACTATED RINGERS IV BOLUS: 1000.0000 mL | INTRAVENOUS | Status: DC

## 2022-10-02 NOTE — Progress Notes (Signed)
Date and time results received: 10/02/22 1830   Test: Potassium Critical Value: 2.7  Name of Provider Notified: Dr. Elly Modena  Orders Received? Or Actions Taken?: No new orders received.

## 2022-10-02 NOTE — H&P (Addendum)
History    CSN: [id number]   Arrival date and time: 10/02/22 1245    Event Date/Time   First Provider Initiated Contact with Patient 10/02/22 1337          Chief Complaint  Patient presents with   Nausea    Jody Robinson , a  32 y.o. [id number] at 60w2dpresents to MAU with complaints on on-going nausea and vomiting since early pregnancy and insomnia for the last 2 weeks. Patient reports that she has been unable to keep anything down for weeks. She states that she has tried phenergan and reglan for N/A without relief. She endorses greater than 10 episodes of vomiting in the last 24 hours. She states she has lost weight during this pregnancy and just overall does not feel well. States she was ordered PO Zofran but states she has not been to pharmacy to pick up due to transportation. She states the last dose of phenergan was this morning at 5am. She states she last had a few sliced peaches this morning but quickly threw them up. She states she is not eating and drinking much d/t vomiting.    She also reports abdominal pain for the last week. Describes pain as "painful pressure" and states it does not go away. She denies burning with urination but states she feels the need to go often. She denies vaginal bleeding and abnormal vaginal discharge.    She also states that she has not be able to sleep at home for the last 2 weeks. Says "my body will just not go to sleep." She denies attempting to trying anything to help her sleep. She states she wont be able to keep it down. She endorses a headache but has not taken anything for it. Believes it is caused by not eating.                OB History       Gravida  4   Para  2   Term  2   Preterm      AB  1   Living  2        SAB  1   IAB      Ectopic      Multiple  0   Live Births  2                   Past Medical History:  Diagnosis Date   Abnormal Pap smear of cervix     Anemia in pregnancy 03/01/2020    S/p IV  feraheme 6/18 and 02/04/2020  CBC       Latest Ref Rng & Units             02/06/2020             01/19/2020             12/21/2019 WBC    4.0 - 10.5 K/uL        17.3(H)             13.8(H)             14.9(H) Hemoglobin      12.0 - 15.0 g/dL        9.7(L)             8.3(L)             8.5(L) Hematocrit        36 - 46 %  32.7(L)  26.9(L)             26.7(L) Platelets            150 - 400 K/uL         483(H)             472(H)             559(H)     Depression     History of COVID-19 09/10/2019    COVID+ 09/08/19 symptomatic    History of IUFD 02/06/2019    Less than 20 weeks   Tourette's syndrome             Past Surgical History:  Procedure Laterality Date   COLPOSCOPY               Family History  Problem Relation Age of Onset   Diabetes Mother        Social History         Tobacco Use   Smoking status: Never   Smokeless tobacco: Never  Vaping Use   Vaping Use: Never used  Substance Use Topics   Alcohol use: Not Currently      Comment: occasional   Drug use: Not Currently      Allergies: No Known Allergies          Medications Prior to Admission  Medication Sig Dispense Refill Last Dose   famotidine (PEPCID) 20 MG tablet Take 1 tablet (20 mg total) by mouth daily. 30 tablet 0 10/01/2022   promethazine (PHENERGAN) 25 MG tablet Take 1 tablet (25 mg total) by mouth every 6 (six) hours as needed for nausea or vomiting. 30 tablet 2 10/02/2022   metoCLOPramide (REGLAN) 10 MG tablet Take 1 tablet (10 mg total) by mouth every 6 (six) hours as needed for nausea or vomiting. 30 tablet 0 Unknown   ondansetron (ZOFRAN-ODT) 4 MG disintegrating tablet Take 1 tablet (4 mg total) by mouth every 8 (eight) hours as needed for nausea or vomiting. 20 tablet 0 Unknown      Review of Systems  Constitutional:  Positive for fatigue. Negative for chills and fever.  Eyes:  Negative for pain and visual disturbance.  Respiratory:  Negative for apnea, shortness of breath and wheezing.    Cardiovascular:  Negative for chest pain and palpitations.  Gastrointestinal:  Positive for nausea and vomiting. Negative for abdominal pain, constipation and diarrhea.  Genitourinary:  Positive for pelvic pain. Negative for difficulty urinating, dysuria, vaginal bleeding, vaginal discharge and vaginal pain.  Musculoskeletal:  Negative for back pain.  Neurological:  Positive for weakness and headaches. Negative for seizures and light-headedness.  Psychiatric/Behavioral:  Positive for sleep disturbance. Negative for suicidal ideas.     Physical Exam    Blood pressure 108/87, pulse (!) 124, temperature 98 F (36.7 C), temperature source Oral, resp. rate 16, height 5' (1.524 m), weight 64.2 kg, last menstrual period 07/22/2022, SpO2 97 %.   Physical Exam Vitals and nursing note reviewed.  Constitutional:      Appearance: Normal appearance. She is ill-appearing. Pale in the face.   HENT:     Head: Normocephalic.  Cardiovascular:     Rate and Rhythm: Normal rate.  Pulmonary:     Effort: Pulmonary effort is normal.  Musculoskeletal:     Cervical back: Normal range of motion. No CVA tenderness   Skin:    General: Skin is warm and dry.     Capillary Refill: Capillary refill takes more than 3 seconds.  Neurological:     Mental Status: She is alert and oriented to person, place, and time.  Psychiatric:        Mood and Affect: Mood normal.      Bedside US completed to assess viability. Pt informed that the ultrasound is considered a limited OB ultrasound and is not intended to be a complete ultrasound exam.  Patient also informed that the ultrasound is not being completed with the intent of assessing for fetal or placental anomalies or any pelvic abnormalities.  Explained that the purpose of today's ultrasound is to assess for  viability.  Patient acknowledges the purpose of the exam and the limitations of the study.    FHT via bedside scan 170bpm.   MAU Course  Procedures    Orders  Placed This Encounter  Procedures   Culture, OB Urine   Urinalysis, Routine w reflex microscopic -Urine, Clean Catch   Urinalysis, Microscopic (reflex)   CBC with Differential/Platelet   Comprehensive metabolic panel   CBC with Differential/Platelet   TSH   Notify physician (specify)   Vital signs   Defer vaginal exam for vaginal bleeding or PROM <37 weeks   Apply Antepartum Care Plan   Initiate Oral Care Protocol   Initiate Carrier Fluid Protocol   Place TED hose   Assess fetal heart tones by doppler   Bed rest with bathroom privileges   Advance diet as tolerated   Full code   Type and screen [place] peripheral IV   Insert peripheral IV      MDM - UA positive for nitrites, suspicion for UTI - Specific gravity high consistent with dehydration.  - Patient difficult stick for IV and fluids.  - RNs and Lab techs attempted to get enough blood for her labs and were unsuccessful.  - Creatine level 3.16 with a GFR of 19.  - CBC still pending  - Call placed to Dr. Elly Modena, for admission for hyperemesis. Discussed current patient presentation and resulted lab work and MD agrees that patient should be admitted.  - Orders placed by MD.   - Bladder scan - 37 mL. Low suspicion for obstruction   Assessment and Plan  - Admit to [facility].  - Report given to RN.    Jacquiline Doe, MSN CNM  10/02/2022, 1:37 PM

## 2022-10-02 NOTE — MAU Note (Signed)
Jody Robinson is a 32 y.o. at 26w2dhere in MAU reporting: ongoing nausea and emesis. Took phenergan today. Has not picked up zofran due to transportation issues. Would like to be admitted.  Onset of complaint: ongoing  Pain score: 7/10  Vitals:   10/02/22 1301  BP: 108/87  Pulse: (!) 124  Resp: 16  Temp: 98 F (36.7 C)  SpO2: 97%     [id number] obtain FHT  Lab orders placed from triage: ua

## 2022-10-02 NOTE — MAU Provider Note (Signed)
History     CSN: [id number]  Arrival date and time: 10/02/22 1245   Event Date/Time   First Provider Initiated Contact with Patient 10/02/22 1410      Chief Complaint  Patient presents with   Nausea   HPI 32 year old 63w2dG4P2 female who presents to MAU with CC of nausea and vomiting. Pt states the nausea and vomiting has been going on for the last 2-3 days but that she has struggled with it her entire pregnancy. She states she is vomiting multiple times per day throughout the entire day. She states her vomiting is mostly clear liquid. She is not eating much. She was able to eat some peaches and crackers this morning but was not able to keep them down. She is not drinking much due to it making her nausea worse. She states she has tried taking phenergan, she feels as if it does not help. She has not tried Diclegis or zofran. She states she can not afford Diclegis. She states she has not tried zofran because she does not have transportation to get to the pharmacy. She was brought to MAU today by her mother. When asked if her mother could taker her to the pharmacy, she states "she has weird hours with her job." Pt is asking to be admitted. She states she has not slept in 2 weeks. She states she has an increase in urinary urgency but no dysuria. She endorses feeling lightheaded. She denies any fever or CVA tenderness.  OB History     Gravida  4   Para  2   Term  2   Preterm      AB  1   Living  2      SAB  1   IAB      Ectopic      Multiple  0   Live Births  2           Past Medical History:  Diagnosis Date   Abnormal Pap smear of cervix    Anemia in pregnancy 03/01/2020   S/p IV feraheme 6/18 and 02/04/2020  CBC Latest Ref Rng & Units 02/06/2020 01/19/2020 12/21/2019 WBC 4.0 - 10.5 K/uL 17.3(H) 13.8(H) 14.9(H) Hemoglobin 12.0 - 15.0 g/dL 9.7(L) 8.3(L) 8.5(L) Hematocrit 36 - 46 % 32.7(L) 26.9(L) 26.7(L) Platelets 150 - 400 K/uL 483(H) 472(H) 559(H)     Depression     History of COVID-19 09/10/2019   COVID+ 09/08/19 symptomatic    History of IUFD 02/06/2019   Less than 20 weeks   Tourette's syndrome     Past Surgical History:  Procedure Laterality Date   COLPOSCOPY      Family History  Problem Relation Age of Onset   Diabetes Mother     Social History   Tobacco Use   Smoking status: Never   Smokeless tobacco: Never  Vaping Use   Vaping Use: Never used  Substance Use Topics   Alcohol use: Not Currently    Comment: occasional   Drug use: Not Currently    Allergies: No Known Allergies  Medications Prior to Admission  Medication Sig Dispense Refill Last Dose   famotidine (PEPCID) 20 MG tablet Take 1 tablet (20 mg total) by mouth daily. 30 tablet 0 10/01/2022   promethazine (PHENERGAN) 25 MG tablet Take 1 tablet (25 mg total) by mouth every 6 (six) hours as needed for nausea or vomiting. 30 tablet 2 10/02/2022   metoCLOPramide (REGLAN) 10 MG tablet Take 1 tablet (10 mg  total) by mouth every 6 (six) hours as needed for nausea or vomiting. 30 tablet 0 Unknown   ondansetron (ZOFRAN-ODT) 4 MG disintegrating tablet Take 1 tablet (4 mg total) by mouth every 8 (eight) hours as needed for nausea or vomiting. 20 tablet 0 Unknown    Review of Systems  Constitutional:  Positive for activity change, appetite change and fatigue. Negative for fever.  Respiratory:  Negative for cough and shortness of breath.   Cardiovascular:  Negative for chest pain and leg swelling.  Gastrointestinal:  Positive for abdominal pain, nausea and vomiting.  Genitourinary:  Positive for decreased urine volume and difficulty urinating. Negative for flank pain, hematuria, pelvic pain and vaginal bleeding.  Musculoskeletal: Negative.   Skin: Negative.   Neurological:  Positive for light-headedness. Negative for syncope and numbness.  Psychiatric/Behavioral:  Positive for sleep disturbance.    Physical Exam   Blood pressure 108/87, pulse (!) 124, temperature 98 F (36.7 C),  temperature source Oral, resp. rate 16, height 5' (1.524 m), weight 64.2 kg, last menstrual period 07/22/2022, SpO2 97 %.  Physical Exam Constitutional:      General: She is not in acute distress.    Appearance: She is ill-appearing.  HENT:     Head: Normocephalic.  Eyes:     Extraocular Movements: Extraocular movements intact.     Pupils: Pupils are equal, round, and reactive to light.  Cardiovascular:     Rate and Rhythm: Normal rate.     Heart sounds: Normal heart sounds. No murmur heard.    No gallop.  Pulmonary:     Effort: Pulmonary effort is normal.     Breath sounds: Normal breath sounds.  Abdominal:     General: Bowel sounds are normal.     Palpations: Abdomen is soft.     Tenderness: There is no right CVA tenderness or left CVA tenderness.  Musculoskeletal:        General: No swelling.     Right lower leg: No edema.     Left lower leg: No edema.  Skin:    Capillary Refill: Capillary refill takes more than 3 seconds.  Neurological:     General: No focal deficit present.     Mental Status: She is alert and oriented to person, place, and time.     MAU Course  Procedures  MDM low  Assessment and Plan  Nausea and Vomiting  Gain IV access. Give LR bolus, Multivitamin infusion. Give zofran IV due to frequent emesis, pt has not tried zofran this pregnancy. Scopolamine patch ordered. Ordered CMP to check electrolyte imbalances, kidney function. Due to frequent ED visits for nausea and vomiting, inability to tolerate food/liquids by mouth concern for hyperemesis gravidarum.  Urinary Tract Infection Positive Leukocytes and Nitrates on UA. Complaints of suprapubic tenderness, increased urinary urgency. Start [place]. Ordered CBC to check WBC. No concern for pyelonephritis of bacteremia at this time  Dehydration Elevated Specific Gravity, mild tachycardia at 106. IV fluid replacement. Encouraged oral intake as tolerated.  Ustin Cruickshank 10/02/2022, 2:10 PM

## 2022-10-03 ENCOUNTER — Encounter (HOSPITAL_COMMUNITY): Payer: Self-pay | Admitting: Obstetrics and Gynecology

## 2022-10-03 ENCOUNTER — Encounter (HOSPITAL_COMMUNITY): Payer: 59

## 2022-10-03 DIAGNOSIS — Z3A10 10 weeks gestation of pregnancy: Secondary | ICD-10-CM | POA: Diagnosis not present

## 2022-10-03 DIAGNOSIS — O211 Hyperemesis gravidarum with metabolic disturbance: Secondary | ICD-10-CM

## 2022-10-03 DIAGNOSIS — N179 Acute kidney failure, unspecified: Secondary | ICD-10-CM | POA: Diagnosis present

## 2022-10-03 DIAGNOSIS — O2301 Infections of kidney in pregnancy, first trimester: Secondary | ICD-10-CM | POA: Diagnosis present

## 2022-10-03 HISTORY — DX: Acute kidney failure, unspecified: N17.9

## 2022-10-03 HISTORY — DX: Hyperemesis gravidarum with metabolic disturbance: O21.1

## 2022-10-03 LAB — T4, FREE: Free T4: 1.26 ng/dL — ABNORMAL HIGH (ref 0.61–1.12)

## 2022-10-03 LAB — CBC WITH DIFFERENTIAL/PLATELET
Abs Immature Granulocytes: 0.06 10*3/uL (ref 0.00–0.07)
Basophils Absolute: 0.1 10*3/uL (ref 0.0–0.1)
Basophils Relative: 1 %
Eosinophils Absolute: 0.1 10*3/uL (ref 0.0–0.5)
Eosinophils Relative: 1 %
HCT: 36.1 % (ref 36.0–46.0)
Hemoglobin: 11.6 g/dL — ABNORMAL LOW (ref 12.0–15.0)
Immature Granulocytes: 1 %
Lymphocytes Relative: 13 %
Lymphs Abs: 1.6 10*3/uL (ref 0.7–4.0)
MCH: 26.9 pg (ref 26.0–34.0)
MCHC: 32.1 g/dL (ref 30.0–36.0)
MCV: 83.8 fL (ref 80.0–100.0)
Monocytes Absolute: 1.1 10*3/uL — ABNORMAL HIGH (ref 0.1–1.0)
Monocytes Relative: 9 %
Neutro Abs: 9.6 10*3/uL — ABNORMAL HIGH (ref 1.7–7.7)
Neutrophils Relative %: 75 %
Platelets: 548 10*3/uL — ABNORMAL HIGH (ref 150–400)
RBC: 4.31 MIL/uL (ref 3.87–5.11)
RDW: 14 % (ref 11.5–15.5)
WBC: 12.5 10*3/uL — ABNORMAL HIGH (ref 4.0–10.5)
nRBC: 0 % (ref 0.0–0.2)

## 2022-10-03 LAB — COMPREHENSIVE METABOLIC PANEL
ALT: 78 U/L — ABNORMAL HIGH (ref 0–44)
AST: 81 U/L — ABNORMAL HIGH (ref 15–41)
Albumin: 3.2 g/dL — ABNORMAL LOW (ref 3.5–5.0)
Alkaline Phosphatase: 93 U/L (ref 38–126)
Anion gap: 14 (ref 5–15)
BUN: 20 mg/dL (ref 6–20)
CO2: 20 mmol/L — ABNORMAL LOW (ref 22–32)
Calcium: 8.6 mg/dL — ABNORMAL LOW (ref 8.9–10.3)
Chloride: 100 mmol/L (ref 98–111)
Creatinine, Ser: 1.32 mg/dL — ABNORMAL HIGH (ref 0.44–1.00)
GFR, Estimated: 55 mL/min — ABNORMAL LOW (ref >60)
Glucose, Bld: 100 mg/dL — ABNORMAL HIGH (ref 70–99)
Potassium: 3 mmol/L — ABNORMAL LOW (ref 3.5–5.1)
Sodium: 134 mmol/L — ABNORMAL LOW (ref 135–145)
Total Bilirubin: 2.4 mg/dL — ABNORMAL HIGH (ref 0.3–1.2)
Total Protein: 7 g/dL (ref 6.5–8.1)

## 2022-10-03 LAB — MAGNESIUM: Magnesium: 2.2 mg/dL (ref 1.7–2.4)

## 2022-10-03 LAB — CULTURE, OB URINE

## 2022-10-03 MED ORDER — SALINE SPRAY 0.65 % NA SOLN
2.0000 | NASAL | Status: DC | PRN
  Administered 2022-10-03: 2 via NASAL
  Filled 2022-10-03: qty 44

## 2022-10-03 MED ORDER — POTASSIUM CHLORIDE 10 MEQ/100ML IV SOLN
10.0000 meq | INTRAVENOUS | Status: DC
  Filled 2022-10-03: qty 100

## 2022-10-03 MED ORDER — POTASSIUM CHLORIDE 10 MEQ/100ML IV SOLN
10.0000 meq | INTRAVENOUS | Status: AC
  Administered 2022-10-03 (×4): 10 meq via INTRAVENOUS
  Filled 2022-10-03 (×3): qty 100

## 2022-10-03 NOTE — Progress Notes (Signed)
[place] PROGRESS NOTE  Jody Robinson is a 32 y.o. [id number] at 84w3dwho is admitted for hyperemesis, pyelonephritis.  Estimated Date of Delivery: 04/28/23  Length of Stay:  0 Days. Admitted 10/02/2022  Subjective: Reports that she tolerated clear liquids. No other concerns.  Patient denies bleeding or loss of fluid per vagina.  Vitals:  Blood pressure 114/67, pulse 97, temperature 97.9 F (36.6 C), temperature source Oral, resp. rate 18, height 5' (1.524 m), weight 64.2 kg, last menstrual period 07/22/2022, SpO2 100 %. Physical Examination: CONSTITUTIONAL: Well-developed, well-nourished female in no acute distress.  NEUROLOGIC: Alert and oriented to person, place, and time. No cranial nerve deficit noted. PSYCHIATRIC: Normal mood and affect. Normal behavior. Normal judgment and thought content. CARDIOVASCULAR: Normal heart rate noted, regular rhythm RESPIRATORY: Effort and breath sounds normal, no problems with respiration noted MUSCULOSKELETAL: Normal range of motion. No edema and no tenderness. 2+ distal pulses. ABDOMEN: Soft, nontender, nondistended CERVIX:  Deferred  Results for orders placed or performed during the hospital encounter of 10/02/22 (from the past 48 hour(s))  Urinalysis, Routine w reflex microscopic -Urine, Clean Catch     Status: Abnormal   Collection Time: 10/02/22  1:12 PM  Result Value Ref Range   Color, Urine AMBER (A) YELLOW    Comment: BIOCHEMICALS MAY BE AFFECTED BY COLOR   APPearance CLEAR CLEAR   Specific Gravity, Urine >1.030 (H) 1.005 - 1.030   pH 5.0 5.0 - 8.0   Glucose, UA 100 (A) NEGATIVE mg/dL   Hgb urine dipstick MODERATE (A) NEGATIVE   Bilirubin Urine LARGE (A) NEGATIVE   Ketones, ur NEGATIVE NEGATIVE mg/dL   Protein, ur 30 (A) NEGATIVE mg/dL   Nitrite POSITIVE (A) NEGATIVE   Leukocytes,Ua SMALL (A) NEGATIVE    Comment: Performed at [facility] [address], [place] [address] Urinalysis,  Microscopic (reflex)     Status: Abnormal   Collection Time: 10/02/22  1:12 PM  Result Value Ref Range   RBC / HPF 0-5 0 - 5 RBC/hpf   WBC, UA 0-5 0 - 5 WBC/hpf   Bacteria, UA MANY (A) NONE SEEN   Squamous Epithelial / HPF 6-10 0 - 5 /HPF   Urine-Other MICROSCOPIC EXAM PERFORMED ON UNCONCENTRATED URINE     Comment: LESS THAN 10 mL OF URINE SUBMITTED Performed at [facility] [address] [place] [address]  Comprehensive metabolic panel     Status: Abnormal   Collection Time: 10/02/22  2:41 PM  Result Value Ref Range   Sodium 132 (L) 135 - 145 mmol/L   Potassium 3.1 (L) 3.5 - 5.1 mmol/L   Chloride 93 (L) 98 - 111 mmol/L   CO2 17 (L) 22 - 32 mmol/L   Glucose, Bld 141 (H) 70 - 99 mg/dL    Comment: Glucose reference range applies only to samples taken after fasting for at least 8 hours.   BUN 43 (H) 6 - 20 mg/dL   Creatinine, Ser 3.16 (H) 0.44 - 1.00 mg/dL   Calcium 9.6 8.9 - 10.3 mg/dL   Total Protein 9.5 (H) 6.5 - 8.1 g/dL   Albumin 4.4 3.5 - 5.0 g/dL   AST 54 (H) 15 - 41 U/L   ALT 53 (H) 0 - 44 U/L   Alkaline Phosphatase 113 38 - 126 U/L   Total Bilirubin 4.0 (H) 0.3 - 1.2 mg/dL   GFR, Estimated 19 (L) >60 mL/min    Comment: (NOTE) Calculated using  the CKD-EPI Creatinine Equation (2021)    Anion gap 22 (H) 5 - 15    Comment: Electrolytes repeated to confirm. Electrolytes repeated to confirm. Performed at [facility], [place] [address], [place], [place] [address]   CBC with Differential/Platelet     Status: Abnormal   Collection Time: 10/02/22  2:41 PM  Result Value Ref Range   WBC 21.5 (H) 4.0 - 10.5 K/uL   RBC 5.74 (H) 3.87 - 5.11 MIL/uL   Hemoglobin 15.9 (H) 12.0 - 15.0 g/dL   HCT 45.0 36.0 - 46.0 %   MCV 78.4 (L) 80.0 - 100.0 fL   MCH 27.7 26.0 - 34.0 pg   MCHC 35.3 30.0 - 36.0 g/dL   RDW 14.1 11.5 - 15.5 %   Platelets 851 (H) 150 - 400 K/uL   nRBC 0.0 0.0 - 0.2 %   Neutrophils Relative % 84 %   Neutro Abs 18.2 (H) 1.7 - 7.7 K/uL    Lymphocytes Relative 9 %   Lymphs Abs 1.9 0.7 - 4.0 K/uL   Monocytes Relative 6 %   Monocytes Absolute 1.2 (H) 0.1 - 1.0 K/uL   Eosinophils Relative 0 %   Eosinophils Absolute 0.0 0.0 - 0.5 K/uL   Basophils Relative 0 %   Basophils Absolute 0.1 0.0 - 0.1 K/uL   Immature Granulocytes 1 %   Abs Immature Granulocytes 0.13 (H) 0.00 - 0.07 K/uL    Comment: Performed at [facility] [address] [place], [place] [address]  Type and screen [place]     Status: None   Collection Time: 10/02/22  5:45 PM  Result Value Ref Range   ABO/RH(D) O POS    Antibody Screen NEG    Sample Expiration      10/05/2022,2359 Performed at [facility], [place] [address], [place], [place] [address]   TSH     Status: Abnormal   Collection Time: 10/02/22  5:45 PM  Result Value Ref Range   TSH 0.060 (L) 0.350 - 4.500 uIU/mL    Comment: Performed by a 3rd Generation assay with a functional sensitivity of <=0.01 uIU/mL. Performed at [facility], [place] [address] [place], [place] [address]   CBC with Differential/Platelet     Status: Abnormal   Collection Time: 10/02/22  5:45 PM  Result Value Ref Range   WBC 18.0 (H) 4.0 - 10.5 K/uL   RBC 4.54 3.87 - 5.11 MIL/uL   Hemoglobin 12.2 12.0 - 15.0 g/dL   HCT 36.1 36.0 - 46.0 %   MCV 79.5 (L) 80.0 - 100.0 fL   MCH 26.9 26.0 - 34.0 pg   MCHC 33.8 30.0 - 36.0 g/dL   RDW 13.9 11.5 - 15.5 %   Platelets  150 - 400 K/uL    PLATELET CLUMPS NOTED ON SMEAR, COUNT APPEARS INCREASED   nRBC 0.0 0.0 - 0.2 %   Neutrophils Relative % 83 %   Neutro Abs 15.0 (H) 1.7 - 7.7 K/uL   Lymphocytes Relative 10 %   Lymphs Abs 1.7 0.7 - 4.0 K/uL   Monocytes Relative 6 %   Monocytes Absolute 1.1 (H) 0.1 - 1.0 K/uL   Eosinophils Relative 0 %   Eosinophils Absolute 0.0 0.0 - 0.5 K/uL   Basophils Relative 0 %   Basophils Absolute 0.0 0.0 - 0.1 K/uL   Immature Granulocytes 1 %   Abs Immature Granulocytes 0.10 (H) 0.00 - 0.07 K/uL  Comment:  Performed at [facility], [place] [address], [place], [place] [address]  Comprehensive metabolic panel     Status: Abnormal   Collection Time: 10/02/22  5:45 PM  Result Value Ref Range   Sodium 137 135 - 145 mmol/L   Potassium 2.7 (LL) 3.5 - 5.1 mmol/L    Comment: CRITICAL RESULT CALLED TO, READ BACK BY AND VERIFIED WITH J.COOK,RN @1827$  10/02/2022 [email or web address].J   Chloride 99 98 - 111 mmol/L   CO2 19 (L) 22 - 32 mmol/L   Glucose, Bld 175 (H) 70 - 99 mg/dL    Comment: Glucose reference range applies only to samples taken after fasting for at least 8 hours.   BUN 40 (H) 6 - 20 mg/dL   Creatinine, Ser 2.58 (H) 0.44 - 1.00 mg/dL   Calcium 8.9 8.9 - 10.3 mg/dL   Total Protein 7.4 6.5 - 8.1 g/dL   Albumin 3.5 3.5 - 5.0 g/dL   AST 60 (H) 15 - 41 U/L   ALT 50 (H) 0 - 44 U/L   Alkaline Phosphatase 83 38 - 126 U/L   Total Bilirubin 3.0 (H) 0.3 - 1.2 mg/dL   GFR, Estimated 25 (L) >60 mL/min    Comment: (NOTE) Calculated using the CKD-EPI Creatinine Equation (2021)    Anion gap 19 (H) 5 - 15    Comment: Performed at [facility], [place] [address], [place], [place] [address]  CBC with Differential/Platelet     Status: Abnormal   Collection Time: 10/03/22  4:51 AM  Result Value Ref Range   WBC 12.5 (H) 4.0 - 10.5 K/uL   RBC 4.31 3.87 - 5.11 MIL/uL   Hemoglobin 11.6 (L) 12.0 - 15.0 g/dL   HCT 36.1 36.0 - 46.0 %   MCV 83.8 80.0 - 100.0 fL   MCH 26.9 26.0 - 34.0 pg   MCHC 32.1 30.0 - 36.0 g/dL   RDW 14.0 11.5 - 15.5 %   Platelets 548 (H) 150 - 400 K/uL    Comment: REPEATED TO VERIFY   nRBC 0.0 0.0 - 0.2 %   Neutrophils Relative % 75 %   Neutro Abs 9.6 (H) 1.7 - 7.7 K/uL   Lymphocytes Relative 13 %   Lymphs Abs 1.6 0.7 - 4.0 K/uL   Monocytes Relative 9 %   Monocytes Absolute 1.1 (H) 0.1 - 1.0 K/uL   Eosinophils Relative 1 %   Eosinophils Absolute 0.1 0.0 - 0.5 K/uL   Basophils Relative 1 %   Basophils Absolute 0.1 0.0 - 0.1 K/uL   Immature Granulocytes 1 %   Abs Immature Granulocytes  0.06 0.00 - 0.07 K/uL    Comment: Performed at [facility], [place] [address] [place], [place] [address]  T4, free     Status: Abnormal   Collection Time: 10/03/22  4:51 AM  Result Value Ref Range   Free T4 1.26 (H) 0.61 - 1.12 ng/dL    Comment: (NOTE) Biotin ingestion may interfere with free T4 tests. If the results are inconsistent with the TSH level, previous test results, or the clinical presentation, then consider biotin interference. If needed, order repeat testing after stopping biotin. Performed at [facility], [place] [address], [place], [place] [address]   Comprehensive metabolic panel     Status: Abnormal   Collection Time: 10/03/22  6:34 AM  Result Value Ref Range   Sodium 134 (L) 135 - 145 mmol/L   Potassium 3.0 (L) 3.5 - 5.1 mmol/L  Chloride 100 98 - 111 mmol/L   CO2 20 (L) 22 - 32 mmol/L   Glucose, Bld 100 (H) 70 - 99 mg/dL    Comment: Glucose reference range applies only to samples taken after fasting for at least 8 hours.   BUN 20 6 - 20 mg/dL   Creatinine, Ser 1.32 (H) 0.44 - 1.00 mg/dL    Comment: DELTA CHECK NOTED   Calcium 8.6 (L) 8.9 - 10.3 mg/dL   Total Protein 7.0 6.5 - 8.1 g/dL   Albumin 3.2 (L) 3.5 - 5.0 g/dL   AST 81 (H) 15 - 41 U/L   ALT 78 (H) 0 - 44 U/L   Alkaline Phosphatase 93 38 - 126 U/L   Total Bilirubin 2.4 (H) 0.3 - 1.2 mg/dL   GFR, Estimated 55 (L) >60 mL/min    Comment: (NOTE) Calculated using the CKD-EPI Creatinine Equation (2021)    Anion gap 14 5 - 15    Comment: Performed at [facility] [address] [place], [place] [address]  Magnesium     Status: None   Collection Time: 10/03/22  6:34 AM  Result Value Ref Range   Magnesium 2.2 1.7 - 2.4 mg/dL    Comment: Performed at [facility] [address] [place], [place] [address]    No results found.  Current scheduled medications  docusate sodium  100 mg Oral Daily   pantoprazole (PROTONIX) IV  40 mg Intravenous Q24H   prenatal multivitamin  1 tablet  Oral Q1200   pyridOXINE  100 mg Intravenous Daily   scopolamine  1 patch Transdermal Q72H    I have reviewed the patient's current medications.  ASSESSMENT: Principal Problem:   Hyperemesis gravidarum with electrolyte imbalance Active Problems:   Pyelonephritis affecting pregnancy in first trimester   Acute prerenal failure due to hyperemesis and pyelnonephritis(HCC)   PLAN: Patient is able to tolerate clears for now, will advance diet as tolerated. Continue antiemetics as needed. Continue Rocephin for pyelonephritis. Follow up pending urine culture. Cr is improved to 1.32 after hydration and treatment of pyelonephritis, continue to monitor. K increased to 3.0, will continue repletion as needed. Normal magnesium. Continue routine antenatal care.   Verita Schneiders, MD, [place] for [employer], [place]

## 2022-10-04 ENCOUNTER — Encounter (HOSPITAL_COMMUNITY): Payer: Self-pay | Admitting: Obstetrics and Gynecology

## 2022-10-04 DIAGNOSIS — Z3A10 10 weeks gestation of pregnancy: Secondary | ICD-10-CM

## 2022-10-04 DIAGNOSIS — O211 Hyperemesis gravidarum with metabolic disturbance: Secondary | ICD-10-CM | POA: Diagnosis not present

## 2022-10-04 LAB — CBC WITH DIFFERENTIAL/PLATELET
Abs Immature Granulocytes: 0.05 10*3/uL (ref 0.00–0.07)
Basophils Absolute: 0 10*3/uL (ref 0.0–0.1)
Basophils Relative: 0 %
Eosinophils Absolute: 0.1 10*3/uL (ref 0.0–0.5)
Eosinophils Relative: 1 %
HCT: 30.4 % — ABNORMAL LOW (ref 36.0–46.0)
Hemoglobin: 9.9 g/dL — ABNORMAL LOW (ref 12.0–15.0)
Immature Granulocytes: 1 %
Lymphocytes Relative: 18 %
Lymphs Abs: 1.4 10*3/uL (ref 0.7–4.0)
MCH: 26.8 pg (ref 26.0–34.0)
MCHC: 32.6 g/dL (ref 30.0–36.0)
MCV: 82.4 fL (ref 80.0–100.0)
Monocytes Absolute: 0.9 10*3/uL (ref 0.1–1.0)
Monocytes Relative: 11 %
Neutro Abs: 5.4 10*3/uL (ref 1.7–7.7)
Neutrophils Relative %: 69 %
Platelets: 458 10*3/uL — ABNORMAL HIGH (ref 150–400)
RBC: 3.69 MIL/uL — ABNORMAL LOW (ref 3.87–5.11)
RDW: 14 % (ref 11.5–15.5)
WBC: 7.9 10*3/uL (ref 4.0–10.5)
nRBC: 0 % (ref 0.0–0.2)

## 2022-10-04 LAB — COMPREHENSIVE METABOLIC PANEL
ALT: 60 U/L — ABNORMAL HIGH (ref 0–44)
AST: 48 U/L — ABNORMAL HIGH (ref 15–41)
Albumin: 2.7 g/dL — ABNORMAL LOW (ref 3.5–5.0)
Alkaline Phosphatase: 79 U/L (ref 38–126)
Anion gap: 10 (ref 5–15)
BUN: <5 mg/dL — ABNORMAL LOW (ref 6–20)
CO2: 24 mmol/L (ref 22–32)
Calcium: 8.4 mg/dL — ABNORMAL LOW (ref 8.9–10.3)
Chloride: 102 mmol/L (ref 98–111)
Creatinine, Ser: 0.75 mg/dL (ref 0.44–1.00)
GFR, Estimated: >60 mL/min (ref >60)
Glucose, Bld: 103 mg/dL — ABNORMAL HIGH (ref 70–99)
Potassium: 3.4 mmol/L — ABNORMAL LOW (ref 3.5–5.1)
Sodium: 136 mmol/L (ref 135–145)
Total Bilirubin: 0.8 mg/dL (ref 0.3–1.2)
Total Protein: 5.7 g/dL — ABNORMAL LOW (ref 6.5–8.1)

## 2022-10-04 MED ORDER — ONDANSETRON 4 MG PO TBDP: 4.0000 mg | ORAL_TABLET | Freq: Four times a day (QID) | ORAL | 2 refills | Status: DC | PRN

## 2022-10-04 MED ORDER — SCOPOLAMINE 1 MG/3DAYS TD PT72: 1.0000 | MEDICATED_PATCH | TRANSDERMAL | 12 refills | Status: DC

## 2022-10-04 MED ORDER — CEFADROXIL 500 MG PO CAPS: 500.0000 mg | ORAL_CAPSULE | Freq: Two times a day (BID) | ORAL | 0 refills | Status: AC

## 2022-10-04 MED ORDER — CEFADROXIL 500 MG PO CAPS
500.0000 mg | ORAL_CAPSULE | Freq: Two times a day (BID) | ORAL | Status: DC
  Administered 2022-10-04: 500 mg via ORAL
  Filled 2022-10-04 (×2): qty 1

## 2022-10-04 MED ORDER — FAMOTIDINE 20 MG PO TABS: 20.0000 mg | ORAL_TABLET | Freq: Two times a day (BID) | ORAL | 2 refills | Status: DC

## 2022-10-04 MED ORDER — PROMETHAZINE HCL 25 MG PO TABS: 25.0000 mg | ORAL_TABLET | Freq: Four times a day (QID) | ORAL | 2 refills | Status: DC | PRN

## 2022-10-04 MED ORDER — METOCLOPRAMIDE HCL 10 MG PO TABS: 10.0000 mg | ORAL_TABLET | Freq: Four times a day (QID) | ORAL | 2 refills | Status: DC | PRN

## 2022-10-04 NOTE — Discharge Summary (Signed)
Antenatal Physician Discharge Summary  Patient ID: Jody Robinson MRN: [id number] DOB/AGE: 09-17-1990 32 y.o.  Admit date: 10/02/2022 Discharge date: 10/04/2022  Admission Diagnoses: Principal Problem:   Hyperemesis gravidarum with electrolyte imbalance Active Problems:   Pyelonephritis affecting pregnancy in first trimester   Acute prerenal failure due to hyperemesis and pyelnonephritis(HCC)   [redacted] weeks gestation of pregnancy  Discharge Diagnoses:  The same  Prenatal Procedures: NST  Consults: None  Hospital Course:  Jody Robinson is a 32 y.o. [id number] with IUP at [id number] for hyperemesis with electrolyte imbalance and presumed pyelonephritis.  Also noted to have prerenal acute kidney injury with admission Cr of 2.6.  Patient was admitted and started on multiple antiemetics, IV hydration, potassium repletion. Also started on Rocephin for her infection.  She improved over time, and her diet was advanced as tolerated.  Her WBC improved from 21 to 7.9 on day of discharge; K went from 2.7 to 3.4, Cr 2.6  to 0.75.  She was noted to be tolerating regular diet and oral medications by day of discharge.  Her urine culture came back with mixed flora but she was continued on oral Duricef to complete the course of therapy.  There were no other concerns.  She was deemed stable for discharge to home with outpatient follow up.  Discharge Exam: Temp:  [98 F (36.7 C)-99 F (37.2 C)] 98 F (36.7 C) (02/23 0808) Pulse Rate:  [93-101] 101 (02/23 0808) Resp:  [18-19] 18 (02/23 0808) BP: (110-130)/(72-82) 110/72 (02/23 0808) SpO2:  [97 %-100 %] 97 % (02/23 [id number] Physical Examination: CONSTITUTIONAL: Well-developed, well-nourished female in no acute distress.  HENT:  Normocephalic, atraumatic, External right and left ear normal.  EYES: Conjunctivae and EOM are normal. Pupils are equal, round, and reactive to light. No scleral icterus.  NECK: Normal range of motion, supple, no masses SKIN:  Skin is warm and dry. No rash noted. Not diaphoretic. No erythema. No pallor. NEUROLOGIC: Alert and oriented to person, place, and time. Normal reflexes, muscle tone coordination. No cranial nerve deficit noted. PSYCHIATRIC: Normal mood and affect. Normal behavior. Normal judgment and thought content. CARDIOVASCULAR: Normal heart rate noted, regular rhythm RESPIRATORY: Effort and breath sounds normal, no problems with respiration noted MUSCULOSKELETAL: Normal range of motion. No edema and no tenderness. 2+ distal pulses. ABDOMEN: Soft, nontender, nondistended CERVIX:  Deferred  Fetal monitoring: FHR: 155 bpm on bedside ultrasound  Significant Diagnostic Studies:  Results for orders placed or performed during the hospital encounter of 10/02/22 (from the past 168 hour(s))  Culture, OB Urine   Collection Time: 10/02/22  1:12 PM   Specimen: Urine, Random  Result Value Ref Range   Specimen Description URINE, RANDOM    Special Requests NONE    Culture (A)     MULTIPLE SPECIES PRESENT, SUGGEST RECOLLECTION NO GROUP B STREP (S.AGALACTIAE) ISOLATED Performed at [address] [place] [address]   Report Status 10/03/2022 FINAL   Urinalysis, Routine w reflex microscopic -Urine, Clean Catch   Collection Time: 10/02/22  1:12 PM  Result Value Ref Range   Color, Urine AMBER (A) YELLOW   APPearance CLEAR CLEAR   Specific Gravity, Urine >1.030 (H) 1.005 - 1.030   pH 5.0 5.0 - 8.0   Glucose, UA 100 (A) NEGATIVE mg/dL   Hgb urine dipstick MODERATE (A) NEGATIVE   Bilirubin Urine LARGE (A) NEGATIVE   Ketones, ur NEGATIVE NEGATIVE mg/dL   Protein, ur 30 (A) NEGATIVE mg/dL   Nitrite  POSITIVE (A) NEGATIVE   Leukocytes,Ua SMALL (A) NEGATIVE  Urinalysis, Microscopic (reflex)   Collection Time: 10/02/22  1:12 PM  Result Value Ref Range   RBC / HPF 0-5 0 - 5 RBC/hpf   WBC, UA 0-5 0 - 5 WBC/hpf   Bacteria, UA MANY (A) NONE SEEN   Squamous Epithelial / HPF 6-10 0 - 5  /HPF   Urine-Other MICROSCOPIC EXAM PERFORMED ON UNCONCENTRATED URINE   Comprehensive metabolic panel   Collection Time: 10/02/22  2:41 PM  Result Value Ref Range   Sodium 132 (L) 135 - 145 mmol/L   Potassium 3.1 (L) 3.5 - 5.1 mmol/L   Chloride 93 (L) 98 - 111 mmol/L   CO2 17 (L) 22 - 32 mmol/L   Glucose, Bld 141 (H) 70 - 99 mg/dL   BUN 43 (H) 6 - 20 mg/dL   Creatinine, Ser 3.16 (H) 0.44 - 1.00 mg/dL   Calcium 9.6 8.9 - 10.3 mg/dL   Total Protein 9.5 (H) 6.5 - 8.1 g/dL   Albumin 4.4 3.5 - 5.0 g/dL   AST 54 (H) 15 - 41 U/L   ALT 53 (H) 0 - 44 U/L   Alkaline Phosphatase 113 38 - 126 U/L   Total Bilirubin 4.0 (H) 0.3 - 1.2 mg/dL   GFR, Estimated 19 (L) >60 mL/min   Anion gap 22 (H) 5 - 15  CBC with Differential/Platelet   Collection Time: 10/02/22  2:41 PM  Result Value Ref Range   WBC 21.5 (H) 4.0 - 10.5 K/uL   RBC 5.74 (H) 3.87 - 5.11 MIL/uL   Hemoglobin 15.9 (H) 12.0 - 15.0 g/dL   HCT 45.0 36.0 - 46.0 %   MCV 78.4 (L) 80.0 - 100.0 fL   MCH 27.7 26.0 - 34.0 pg   MCHC 35.3 30.0 - 36.0 g/dL   RDW 14.1 11.5 - 15.5 %   Platelets 851 (H) 150 - 400 K/uL   nRBC 0.0 0.0 - 0.2 %   Neutrophils Relative % 84 %   Neutro Abs 18.2 (H) 1.7 - 7.7 K/uL   Lymphocytes Relative 9 %   Lymphs Abs 1.9 0.7 - 4.0 K/uL   Monocytes Relative 6 %   Monocytes Absolute 1.2 (H) 0.1 - 1.0 K/uL   Eosinophils Relative 0 %   Eosinophils Absolute 0.0 0.0 - 0.5 K/uL   Basophils Relative 0 %   Basophils Absolute 0.1 0.0 - 0.1 K/uL   Immature Granulocytes 1 %   Abs Immature Granulocytes 0.13 (H) 0.00 - 0.07 K/uL  TSH   Collection Time: 10/02/22  5:45 PM  Result Value Ref Range   TSH 0.060 (L) 0.350 - 4.500 uIU/mL  CBC with Differential/Platelet   Collection Time: 10/02/22  5:45 PM  Result Value Ref Range   WBC 18.0 (H) 4.0 - 10.5 K/uL   RBC 4.54 3.87 - 5.11 MIL/uL   Hemoglobin 12.2 12.0 - 15.0 g/dL   HCT 36.1 36.0 - 46.0 %   MCV 79.5 (L) 80.0 - 100.0 fL   MCH 26.9 26.0 - 34.0 pg   MCHC 33.8 30.0 -  36.0 g/dL   RDW 13.9 11.5 - 15.5 %   Platelets  150 - 400 K/uL    PLATELET CLUMPS NOTED ON SMEAR, COUNT APPEARS INCREASED   nRBC 0.0 0.0 - 0.2 %   Neutrophils Relative % 83 %   Neutro Abs 15.0 (H) 1.7 - 7.7 K/uL   Lymphocytes Relative 10 %   Lymphs Abs 1.7 0.7 - 4.0  K/uL   Monocytes Relative 6 %   Monocytes Absolute 1.1 (H) 0.1 - 1.0 K/uL   Eosinophils Relative 0 %   Eosinophils Absolute 0.0 0.0 - 0.5 K/uL   Basophils Relative 0 %   Basophils Absolute 0.0 0.0 - 0.1 K/uL   Immature Granulocytes 1 %   Abs Immature Granulocytes 0.10 (H) 0.00 - 0.07 K/uL  Comprehensive metabolic panel   Collection Time: 10/02/22  5:45 PM  Result Value Ref Range   Sodium 137 135 - 145 mmol/L   Potassium 2.7 (LL) 3.5 - 5.1 mmol/L   Chloride 99 98 - 111 mmol/L   CO2 19 (L) 22 - 32 mmol/L   Glucose, Bld 175 (H) 70 - 99 mg/dL   BUN 40 (H) 6 - 20 mg/dL   Creatinine, Ser 2.58 (H) 0.44 - 1.00 mg/dL   Calcium 8.9 8.9 - 10.3 mg/dL   Total Protein 7.4 6.5 - 8.1 g/dL   Albumin 3.5 3.5 - 5.0 g/dL   AST 60 (H) 15 - 41 U/L   ALT 50 (H) 0 - 44 U/L   Alkaline Phosphatase 83 38 - 126 U/L   Total Bilirubin 3.0 (H) 0.3 - 1.2 mg/dL   GFR, Estimated 25 (L) >60 mL/min   Anion gap 19 (H) 5 - 15  Type and screen [place]   Collection Time: 10/02/22  5:45 PM  Result Value Ref Range   ABO/RH(D) O POS    Antibody Screen NEG    Sample Expiration      10/05/2022,2359 Performed at [facility], [address], [place], [place] [address]   CBC with Differential/Platelet   Collection Time: 10/03/22  4:51 AM  Result Value Ref Range   WBC 12.5 (H) 4.0 - 10.5 K/uL   RBC 4.31 3.87 - 5.11 MIL/uL   Hemoglobin 11.6 (L) 12.0 - 15.0 g/dL   HCT 36.1 36.0 - 46.0 %   MCV 83.8 80.0 - 100.0 fL   MCH 26.9 26.0 - 34.0 pg   MCHC 32.1 30.0 - 36.0 g/dL   RDW 14.0 11.5 - 15.5 %   Platelets 548 (H) 150 - 400 K/uL   nRBC 0.0 0.0 - 0.2 %   Neutrophils Relative % 75 %   Neutro Abs 9.6 (H) 1.7 - 7.7 K/uL    Lymphocytes Relative 13 %   Lymphs Abs 1.6 0.7 - 4.0 K/uL   Monocytes Relative 9 %   Monocytes Absolute 1.1 (H) 0.1 - 1.0 K/uL   Eosinophils Relative 1 %   Eosinophils Absolute 0.1 0.0 - 0.5 K/uL   Basophils Relative 1 %   Basophils Absolute 0.1 0.0 - 0.1 K/uL   Immature Granulocytes 1 %   Abs Immature Granulocytes 0.06 0.00 - 0.07 K/uL  T4, free   Collection Time: 10/03/22  4:51 AM  Result Value Ref Range   Free T4 1.26 (H) 0.61 - 1.12 ng/dL  Comprehensive metabolic panel   Collection Time: 10/03/22  6:34 AM  Result Value Ref Range   Sodium 134 (L) 135 - 145 mmol/L   Potassium 3.0 (L) 3.5 - 5.1 mmol/L   Chloride 100 98 - 111 mmol/L   CO2 20 (L) 22 - 32 mmol/L   Glucose, Bld 100 (H) 70 - 99 mg/dL   BUN 20 6 - 20 mg/dL   Creatinine, Ser 1.32 (H) 0.44 - 1.00 mg/dL   Calcium 8.6 (L) 8.9 - 10.3 mg/dL   Total Protein 7.0 6.5 - 8.1 g/dL  Albumin 3.2 (L) 3.5 - 5.0 g/dL   AST 81 (H) 15 - 41 U/L   ALT 78 (H) 0 - 44 U/L   Alkaline Phosphatase 93 38 - 126 U/L   Total Bilirubin 2.4 (H) 0.3 - 1.2 mg/dL   GFR, Estimated 55 (L) >60 mL/min   Anion gap 14 5 - 15  Magnesium   Collection Time: 10/03/22  6:34 AM  Result Value Ref Range   Magnesium 2.2 1.7 - 2.4 mg/dL  Comprehensive metabolic panel   Collection Time: 10/04/22  5:13 AM  Result Value Ref Range   Sodium 136 135 - 145 mmol/L   Potassium 3.4 (L) 3.5 - 5.1 mmol/L   Chloride 102 98 - 111 mmol/L   CO2 24 22 - 32 mmol/L   Glucose, Bld 103 (H) 70 - 99 mg/dL   BUN <5 (L) 6 - 20 mg/dL   Creatinine, Ser 0.75 0.44 - 1.00 mg/dL   Calcium 8.4 (L) 8.9 - 10.3 mg/dL   Total Protein 5.7 (L) 6.5 - 8.1 g/dL   Albumin 2.7 (L) 3.5 - 5.0 g/dL   AST 48 (H) 15 - 41 U/L   ALT 60 (H) 0 - 44 U/L   Alkaline Phosphatase 79 38 - 126 U/L   Total Bilirubin 0.8 0.3 - 1.2 mg/dL   GFR, Estimated >60 >60 mL/min   Anion gap 10 5 - 15  CBC with Differential/Platelet   Collection Time: 10/04/22  5:13 AM  Result Value Ref Range   WBC 7.9 4.0 - 10.5 K/uL    RBC 3.69 (L) 3.87 - 5.11 MIL/uL   Hemoglobin 9.9 (L) 12.0 - 15.0 g/dL   HCT 30.4 (L) 36.0 - 46.0 %   MCV 82.4 80.0 - 100.0 fL   MCH 26.8 26.0 - 34.0 pg   MCHC 32.6 30.0 - 36.0 g/dL   RDW 14.0 11.5 - 15.5 %   Platelets 458 (H) 150 - 400 K/uL   nRBC 0.0 0.0 - 0.2 %   Neutrophils Relative % 69 %   Neutro Abs 5.4 1.7 - 7.7 K/uL   Lymphocytes Relative 18 %   Lymphs Abs 1.4 0.7 - 4.0 K/uL   Monocytes Relative 11 %   Monocytes Absolute 0.9 0.1 - 1.0 K/uL   Eosinophils Relative 1 %   Eosinophils Absolute 0.1 0.0 - 0.5 K/uL   Basophils Relative 0 %   Basophils Absolute 0.0 0.0 - 0.1 K/uL   Immature Granulocytes 1 %   Abs Immature Granulocytes 0.05 0.00 - 0.07 K/uL  Results for orders placed or performed during the hospital encounter of 09/27/22 (from the past 168 hour(s))  Urinalysis, Routine w reflex microscopic -Urine, Clean Catch   Collection Time: 09/27/22 10:51 AM  Result Value Ref Range   Color, Urine YELLOW YELLOW   APPearance HAZY (A) CLEAR   Specific Gravity, Urine >=1.030 1.005 - 1.030   pH 5.5 5.0 - 8.0   Glucose, UA NEGATIVE NEGATIVE mg/dL   Hgb urine dipstick MODERATE (A) NEGATIVE   Bilirubin Urine MODERATE (A) NEGATIVE   Ketones, ur >=80 (A) NEGATIVE mg/dL   Protein, ur 100 (A) NEGATIVE mg/dL   Nitrite NEGATIVE NEGATIVE   Leukocytes,Ua NEGATIVE NEGATIVE  Urinalysis, Microscopic (reflex)   Collection Time: 09/27/22 10:51 AM  Result Value Ref Range   RBC / HPF 11-20 0 - 5 RBC/hpf   WBC, UA 6-10 0 - 5 WBC/hpf   Bacteria, UA MANY (A) NONE SEEN   Squamous Epithelial / HPF 0-5 0 -  5 /HPF   Mucus PRESENT    Hyaline Casts, UA PRESENT   Comprehensive metabolic panel   Collection Time: 09/27/22 11:38 AM  Result Value Ref Range   Sodium 130 (L) 135 - 145 mmol/L   Potassium 3.6 3.5 - 5.1 mmol/L   Chloride 95 (L) 98 - 111 mmol/L   CO2 21 (L) 22 - 32 mmol/L   Glucose, Bld 87 70 - 99 mg/dL   BUN 11 6 - 20 mg/dL   Creatinine, Ser 0.78 0.44 - 1.00 mg/dL   Calcium 9.1 8.9  - 10.3 mg/dL   Total Protein 9.2 (H) 6.5 - 8.1 g/dL   Albumin 4.3 3.5 - 5.0 g/dL   AST 26 15 - 41 U/L   ALT 27 0 - 44 U/L   Alkaline Phosphatase 89 38 - 126 U/L   Total Bilirubin 2.1 (H) 0.3 - 1.2 mg/dL   GFR, Estimated >60 >60 mL/min   Anion gap 14 5 - 15  CBC with Differential   Collection Time: 09/27/22 11:38 AM  Result Value Ref Range   WBC 12.9 (H) 4.0 - 10.5 K/uL   RBC 5.00 3.87 - 5.11 MIL/uL   Hemoglobin 13.4 12.0 - 15.0 g/dL   HCT 40.5 36.0 - 46.0 %   MCV 81.0 80.0 - 100.0 fL   MCH 26.8 26.0 - 34.0 pg   MCHC 33.1 30.0 - 36.0 g/dL   RDW 13.9 11.5 - 15.5 %   Platelets 663 (H) 150 - 400 K/uL   nRBC 0.0 0.0 - 0.2 %   Neutrophils Relative % 83 %   Neutro Abs 10.7 (H) 1.7 - 7.7 K/uL   Lymphocytes Relative 13 %   Lymphs Abs 1.6 0.7 - 4.0 K/uL   Monocytes Relative 4 %   Monocytes Absolute 0.6 0.1 - 1.0 K/uL   Eosinophils Relative 0 %   Eosinophils Absolute 0.0 0.0 - 0.5 K/uL   Basophils Relative 0 %   Basophils Absolute 0.0 0.0 - 0.1 K/uL   Immature Granulocytes 0 %   Abs Immature Granulocytes 0.04 0.00 - 0.07 K/uL  Magnesium   Collection Time: 09/27/22 11:38 AM  Result Value Ref Range   Magnesium 2.4 1.7 - 2.4 mg/dL   No results found.  Future Appointments  Date Time Provider [place]  10/07/2022 10:30 AM [facility] RM 1 [facility] None  10/08/2022  9:15 AM [facility]TA[facility] [place] [facility]  10/09/2022  9:55 AM Aletha Halim, MD [facility]    Discharge Condition: Stable  Discharge disposition: 01-Home or Self Care        Allergies as of 10/04/2022   No Known Allergies      Medication List     TAKE these medications    cefadroxil 500 MG capsule Commonly known as: DURICEF Take 1 capsule (500 mg total) by mouth 2 (two) times daily for 12 days.   famotidine 20 MG tablet Commonly known as: Pepcid Take 1 tablet (20 mg total) by mouth 2 (two) times daily. What changed: when to take this   metoCLOPramide 10 MG tablet Commonly known as:  Reglan Take 1 tablet (10 mg total) by mouth every 6 (six) hours as needed for nausea or vomiting.   ondansetron 4 MG disintegrating tablet Commonly known as: ZOFRAN-ODT Take 1 tablet (4 mg total) by mouth every 6 (six) hours as needed for nausea or vomiting. What changed: when to take this   promethazine 25 MG tablet Commonly known as: PHENERGAN Take 1 tablet (25 mg  total) by mouth every 6 (six) hours as needed for nausea, vomiting or refractory nausea / vomiting. What changed: reasons to take this   scopolamine 1 MG/3DAYS Commonly known as: TRANSDERM-SCOP Place 1 patch (1.5 mg total) onto the skin every 3 (three) days. Start taking on: October 05, 2022         Total discharge time: 15 minutes   Signed: Verita Schneiders M.D. 10/04/2022, 10:44 AM

## 2022-10-05 LAB — CULTURE, OB URINE: Culture: NO GROWTH

## 2022-10-07 ENCOUNTER — Other Ambulatory Visit: Payer: Self-pay | Admitting: Family Medicine

## 2022-10-07 ENCOUNTER — Non-Acute Institutional Stay (HOSPITAL_COMMUNITY)
Admit: 2022-10-07 | Discharge: 2022-10-07 | Disposition: A | Discharge status: Home / Self Care | Payer: 59 | Source: Ambulatory Visit | Attending: Internal Medicine | Admitting: Internal Medicine

## 2022-10-07 DIAGNOSIS — O99011 Anemia complicating pregnancy, first trimester: Secondary | ICD-10-CM

## 2022-10-07 DIAGNOSIS — N179 Acute kidney failure, unspecified: Secondary | ICD-10-CM

## 2022-10-07 DIAGNOSIS — O211 Hyperemesis gravidarum with metabolic disturbance: Secondary | ICD-10-CM

## 2022-10-07 DIAGNOSIS — O26831 Pregnancy related renal disease, first trimester: Secondary | ICD-10-CM | POA: Diagnosis not present

## 2022-10-07 LAB — COMPREHENSIVE METABOLIC PANEL
ALT: 81 U/L — ABNORMAL HIGH (ref 0–44)
AST: 48 U/L — ABNORMAL HIGH (ref 15–41)
Albumin: 3.2 g/dL — ABNORMAL LOW (ref 3.5–5.0)
Alkaline Phosphatase: 96 U/L (ref 38–126)
Anion gap: 8 (ref 5–15)
BUN: 8 mg/dL (ref 6–20)
CO2: 28 mmol/L (ref 22–32)
Calcium: 8.5 mg/dL — ABNORMAL LOW (ref 8.9–10.3)
Chloride: 101 mmol/L (ref 98–111)
Creatinine, Ser: 0.58 mg/dL (ref 0.44–1.00)
GFR, Estimated: >60 mL/min (ref >60)
Glucose, Bld: 91 mg/dL (ref 70–99)
Potassium: 3.4 mmol/L — ABNORMAL LOW (ref 3.5–5.1)
Sodium: 137 mmol/L (ref 135–145)
Total Bilirubin: 0.6 mg/dL (ref 0.3–1.2)
Total Protein: 6.8 g/dL (ref 6.5–8.1)

## 2022-10-07 LAB — CBC
HCT: 32.6 % — ABNORMAL LOW (ref 36.0–46.0)
Hemoglobin: 10 g/dL — ABNORMAL LOW (ref 12.0–15.0)
MCH: 26.4 pg (ref 26.0–34.0)
MCHC: 30.7 g/dL (ref 30.0–36.0)
MCV: 86 fL (ref 80.0–100.0)
Platelets: 580 10*3/uL — ABNORMAL HIGH (ref 150–400)
RBC: 3.79 MIL/uL — ABNORMAL LOW (ref 3.87–5.11)
RDW: 14.1 % (ref 11.5–15.5)
WBC: 15.9 10*3/uL — ABNORMAL HIGH (ref 4.0–10.5)
nRBC: 0 % (ref 0.0–0.2)

## 2022-10-07 MED ORDER — SODIUM CHLORIDE 0.9 % IV SOLN
8.0000 mg | INTRAVENOUS | Status: DC
  Filled 2022-10-07: qty 4

## 2022-10-07 MED ORDER — SODIUM CHLORIDE 0.9 % IV SOLN: INTRAVENOUS | Status: DC | PRN

## 2022-10-07 MED ORDER — LACTATED RINGERS IV BOLUS
1000.0000 mL | INTRAVENOUS | Status: DC
  Administered 2022-10-07: 1000 mL via INTRAVENOUS

## 2022-10-07 MED ORDER — ONDANSETRON 8 MG/NS 50 ML IVPB
8.0000 mg | INTRAVENOUS | Status: DC
  Administered 2022-10-07: 8 mg via INTRAVENOUS
  Filled 2022-10-07: qty 54

## 2022-10-07 MED ORDER — PANTOPRAZOLE SODIUM 40 MG IV SOLR
40.0000 mg | INTRAVENOUS | Status: DC
  Administered 2022-10-07: 40 mg via INTRAVENOUS
  Filled 2022-10-07: qty 10

## 2022-10-07 MED ORDER — SODIUM CHLORIDE 0.9 % IV SOLN
12.5000 mg | INTRAVENOUS | Status: DC
  Administered 2022-10-07: 12.5 mg via INTRAVENOUS
  Filled 2022-10-07: qty 12.5

## 2022-10-07 NOTE — Progress Notes (Signed)
PATIENT CARE CENTER NOTE  Diagnosis:Hyperemesis gravidarum with electrolyte imbalance [O21.1]    Provider: Lauretta Chester, MD  Procedure: Fluid Bolus and Anti-emetics (#1 of 30)  Note: Patient received 1 L Lactated Ringers fluid Bolus ran over 1 hour, IVBP Phenergan 12.5 mg, IVPB Zofran 8 mg, and IV Protonix 40 mg via PIV. Ordered labs: CBC (#1 of 1) and CMP (#1 of 3 weekly) drawn peripherally by phlebotomist. Pt tolerated infusion with no adverse reaction. Vital signs stable. AVS offered, but pt declined. Pt advised that provider has orders for her to come 2x weekly for a total of 30 doses, and that pt can schedule next appointment. Pt verbalized understanding, and next appointment scheduled for 10/10/2022. Pt is alert, oriented and ambulatory at discharge.

## 2022-10-08 ENCOUNTER — Telehealth (INDEPENDENT_AMBULATORY_CARE_PROVIDER_SITE_OTHER): Payer: 59

## 2022-10-08 DIAGNOSIS — O2301 Infections of kidney in pregnancy, first trimester: Secondary | ICD-10-CM

## 2022-10-08 DIAGNOSIS — Z3689 Encounter for other specified antenatal screening: Secondary | ICD-10-CM

## 2022-10-08 DIAGNOSIS — O0990 Supervision of high risk pregnancy, unspecified, unspecified trimester: Secondary | ICD-10-CM | POA: Insufficient documentation

## 2022-10-08 MED ORDER — PRENATAL PLUS 27-1 MG PO TABS: 1.0000 | ORAL_TABLET | Freq: Every day | ORAL | 11 refills | Status: AC

## 2022-10-08 MED ORDER — BLOOD PRESSURE MONITORING DEVI: 1.0000 | 0 refills | Status: DC

## 2022-10-08 NOTE — Progress Notes (Signed)
New OB Intake  I connected with Jody Robinson  on 10/08/22 at  9:15 AM EST by MyChart Video Visit and verified that I am speaking with the correct person using two identifiers. Nurse is located at [facility] and pt is located at home.  I discussed the limitations, risks, security and privacy concerns of performing an evaluation and management service by telephone and the availability of in person appointments. I also discussed with the patient that there may be a patient responsible charge related to this service. The patient expressed understanding and agreed to proceed.  I explained I am completing New OB Intake today. We discussed EDD of 04/28/23 that is based on LMP of 07/22/22. Pt is G4/P2. I reviewed her allergies, medications, Medical/Surgical/OB history, and appropriate screenings. I informed her of [facility] services. [facility] information placed in AVS. Based on history, this is a high risk pregnancy.  Patient Active Problem List   Diagnosis Date Noted   [redacted] weeks gestation of pregnancy 10/04/2022   Hyperemesis gravidarum with electrolyte imbalance 10/03/2022   Pyelonephritis affecting pregnancy in first trimester 10/03/2022   Acute prerenal failure due to hyperemesis and pyelnonephritis(HCC) 10/03/2022   Tourette's disease 05/10/2022   Migraine without aura and without status migrainosus, not intractable 05/10/2022   Low TSH level 09/17/2019   Depression    History of abnormal cervical Pap smear 10/09/2017    Concerns addressed today  Delivery Plans Plans to deliver at [facility]. Patient given information for [facility] Healthy Baby website for more information about Women's and [place]. Patient is not interested in water birth. Offered upcoming OB visit with CNM to discuss further.  MyChart/Babyscripts MyChart access verified. I explained pt will have some visits in office and some virtually. Babyscripts instructions given and order placed. Patient verifies receipt of registration  text/e-mail. Account successfully created and app downloaded.  Blood Pressure Cuff/Weight Scale Blood pressure cuff ordered for patient to pick-up from [employer]. Explained after first prenatal appt pt will check weekly and document in 73. Patient does have weight scale.  Anatomy US Explained first scheduled [name] will be around 19 weeks. Anatomy US scheduled for 12/02/22 at 0930a. Pt notified to arrive at 0915a.  Labs Discussed Jody Robinson genetic screening with patient. Would like both Panorama and Horizon drawn at new OB visit. Routine prenatal labs needed.  COVID Vaccine Patient has not had COVID vaccine.   Is patient a CenteringPregnancy candidate?  Not a Candidate  Not a candidate due to Complex coordination of care needed If accepted,    Is patient a Mom+Baby Combined Care candidate?  Not a candidate    Social Determinants of Health Food Insecurity: Patient denies food insecurity. WIC Referral: Patient is interested in referral to [facility].  Transportation: Patient denies transportation needs. Childcare: Discussed no children allowed at ultrasound appointments. Offered childcare services; patient declines childcare services at this time.  Interested in [place]? If yes, send referral.   First visit review I reviewed new OB appt with patient. Explained pt will be seen by Dr. Ilda Robinson at first visit; encounter routed to appropriate provider. Explained that patient will be seen by pregnancy navigator following visit with provider.   Jody Robinson, [place] 10/08/2022  9:27 AM

## 2022-10-09 ENCOUNTER — Encounter: Payer: Self-pay | Admitting: Obstetrics and Gynecology

## 2022-10-09 ENCOUNTER — Other Ambulatory Visit (HOSPITAL_COMMUNITY)
Admission: RE | Admit: 2022-10-09 | Discharge: 2022-10-09 | Disposition: A | Discharge status: Home / Self Care | Payer: 59 | Source: Ambulatory Visit | Attending: Obstetrics and Gynecology | Admitting: Obstetrics and Gynecology

## 2022-10-09 ENCOUNTER — Ambulatory Visit (INDEPENDENT_AMBULATORY_CARE_PROVIDER_SITE_OTHER): Payer: 59 | Admitting: Obstetrics and Gynecology

## 2022-10-09 ENCOUNTER — Other Ambulatory Visit: Payer: Self-pay

## 2022-10-09 VITALS — BP 119/80 | HR 105 | Wt 154.9 lb

## 2022-10-09 DIAGNOSIS — O0990 Supervision of high risk pregnancy, unspecified, unspecified trimester: Secondary | ICD-10-CM | POA: Insufficient documentation

## 2022-10-09 DIAGNOSIS — O2301 Infections of kidney in pregnancy, first trimester: Secondary | ICD-10-CM

## 2022-10-09 DIAGNOSIS — O211 Hyperemesis gravidarum with metabolic disturbance: Secondary | ICD-10-CM | POA: Diagnosis not present

## 2022-10-09 DIAGNOSIS — O0991 Supervision of high risk pregnancy, unspecified, first trimester: Secondary | ICD-10-CM

## 2022-10-09 DIAGNOSIS — Z3A11 11 weeks gestation of pregnancy: Secondary | ICD-10-CM

## 2022-10-09 DIAGNOSIS — Z8619 Personal history of other infectious and parasitic diseases: Secondary | ICD-10-CM | POA: Insufficient documentation

## 2022-10-09 DIAGNOSIS — R7401 Elevation of levels of liver transaminase levels: Secondary | ICD-10-CM

## 2022-10-09 HISTORY — DX: Personal history of other infectious and parasitic diseases: Z86.19

## 2022-10-09 NOTE — Progress Notes (Addendum)
New OB Note  10/09/2022   Clinic: Center for [facility]  Chief Complaint: new OB  Transfer of Care Patient: no  History of Present Illness: Jody Robinson is a 32 y.o. [id number] at 11/2 weeks (Peak 9/16, based on Patient's last menstrual period was 07/22/2022 (exact date).=6wk u/s.  Preg complicated by has History of 15wk loss (2020); Depression; Low TSH level; Tourette's disease; Hyperemesis gravidarum with electrolyte imbalance; Pyelonephritis affecting pregnancy in first trimester; Acute prerenal failure due to hyperemesis and pyelnonephritis(HCC); Supervision of high risk pregnancy, antepartum; Transaminitis; and History of herpes genitalis on their problem list.   Patient inpatient from 2/21-23 for AKI likely due to n/v, pre-renal and ?pyelo.   Patient states she is doing better with her n/v than with her last pregnancy and is doing decent. She does zofran and phenergan (both bid) with qhs pepcid at home and got her first outpatient IV zofran/ppi/phenergan infusion yesterday.   ROS: A 12-point review of systems was performed and negative, except as stated in the above HPI.  OBGYN History: As per HPI. OB History  Gravida Para Term Preterm AB Living  '4 2 2   1 2  '$ SAB IAB Ectopic Multiple Live Births  1     0 2    # Outcome Date GA Lbr Len/2nd Weight Sex Delivery Anes PTL Lv  4 Current           3 Term 03/01/20 16w0d03:23 / 00:15 8 lb 6.4 oz (3.81 kg) F Vag-Spont EPI  LIV     Birth Comments: WDL   2 SAB 01/2019 184w0d       1 Term 03/08/13 3951w0d lb 8 oz (2.495 kg)  Vag-Spont EPI  LIV     Birth Comments: no complications   History of pap smears: Yes. Last pap smear 2020 and results were wnl    Past Medical History: Past Medical History:  Diagnosis Date   Abnormal Pap smear of cervix    Anemia in pregnancy 03/01/2020   S/p IV feraheme 6/18 and 02/04/2020  CBC Latest Ref Rng & Units 02/06/2020 01/19/2020 12/21/2019 WBC 4.0 - 10.5 K/uL 17.3(H) 13.8(H) 14.9(H)  Hemoglobin 12.0 - 15.0 g/dL 9.7(L) 8.3(L) 8.5(L) Hematocrit 36 - 46 % 32.7(L) 26.9(L) 26.7(L) Platelets 150 - 400 K/uL 483(H) 472(H) 559(H)     Depression    Gallbladder sludge 08/22/2019   Herpes simplex 01/16/2013   History of abnormal cervical Pap smear 10/09/2017   2020: pap smear wnl   History of COVID-19 09/10/2019   COVID+ 09/08/19 symptomatic    History of IUFD 02/06/2019   Less than 20 weeks   Migraine without aura and without status migrainosus, not intractable 05/10/2022   Pancreatitis, acute 08/20/2019   Seasonal allergies 10/09/2017   Thrombocytosis 08/16/2019   CBC  Latest Ref Rng & Units  02/06/2020  01/19/2020  12/21/2019  WBC  4.0 - 10.5 K/uL  17.3(H)  13.8(H)  14.9(H)  Hemoglobin  12.0 - 15.0 g/dL  9.7(L)  8.3(L)  8.5(L)  Hematocrit  36 - 46 %  32.7(L)  26.9(L)  26.7(L)  Platelets  150 - 400 K/uL  483(H)  472(H)  559(H)         Tourette's syndrome    Trichomoniasis     Past Surgical History: Past Surgical History:  Procedure Laterality Date   COLPOSCOPY      Family History:  Family History  Problem Relation Age of Onset   Diabetes Mother  Social History:  Social History   Socioeconomic History   Marital status: Single    Spouse name: Not on file   Number of children: Not on file   Years of education: Not on file   Highest education level: Not on file  Occupational History   Not on file  Tobacco Use   Smoking status: Never   Smokeless tobacco: Never  Vaping Use   Vaping Use: Never used  Substance and Sexual Activity   Alcohol use: Not Currently    Comment: occasional   Drug use: Not Currently   Sexual activity: Yes    Birth control/protection: None  Other Topics Concern   Not on file  Social History Narrative   Not on file   Social Determinants of Health   Financial Resource Strain: Not on file  Food Insecurity: No Food Insecurity (07/17/2021)   Hunger Vital Sign    Worried About Running Out of Food in the Last Year: Never true    Ran Out  of Food in the Last Year: Never true  Transportation Needs: No Transportation Needs (07/17/2021)   PRAPARE - Hydrologist (Medical): No    Lack of Transportation (Non-Medical): No  Physical Activity: Not on file  Stress: Not on file  Social Connections: Not on file  Intimate Partner Violence: Not on file   Allergy: No Known Allergies  Current Outpatient Medications: Prenatal vitamin Bid zofran and phenergan Pepcid qhs  Physical Exam:   BP 119/80   Pulse (!) 105   Wt 154 lb 14.4 oz (70.3 kg)   LMP 07/22/2022 (Exact Date)   BMI 30.25 kg/m  Body mass index is 30.25 kg/m. Contractions: Not present Vag. Bleeding: None. Fundal height: not applicable FHTs: [id number]  General appearance: Well nourished, well developed female in no acute distress.  Neck:  Supple, normal appearance, and no thyromegaly  Cardiovascular: S1, S2 normal, no murmur, rub or gallop, regular rate and rhythm Respiratory:  Clear to auscultation bilateral. Normal respiratory effort Abdomen: positive bowel sounds and no masses, hernias; diffusely non tender to palpation, non distended Breasts: breasts appear normal, no suspicious masses, no skin or nipple changes or axillary nodes, and normal palpation. Neuro/Psych:  Normal mood and affect.  Skin:  Warm and dry.  Lymphatic:  No inguinal lymphadenopathy.   Pelvic exam: is not limited by body habitus EGBUS: within normal limits, Vagina: within normal limits and with no blood in the vault, Cervix: normal appearing cervix without discharge or lesions, closed/long/high, Uterus:  enlarged, c/w 10-12 week size, and Adnexa:  normal adnexa and no mass, fullness, tenderness  Laboratory: reviewed  Imaging:  reviewed  Assessment: patient doing well  Plan: 1. Supervision of high risk pregnancy, antepartum Routine care. Recommend afp after 15wks - Culture, OB Urine - CBC/D/Plt+RPR+Rh+ABO+RubIgG... - Hemoglobin A1c - Panorama Prenatal Test  Full Panel - [place] Basic Panel - Cytology - PAP( [place]) - Comprehensive metabolic panel - Magnesium  2. Hyperemesis gravidarum with electrolyte imbalance Continue current regimen. Plan is for 2x/week infusions for 3-4 months and then can reassess Baseline weight around 160s-170s and patient has had weight gain recently.  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 Okay to write out of work from 2/21 until her next visit  3. Pyelonephritis affecting pregnancy in first trimester ?diagnosis. Negative ucx. S/p duricef tx. Can do late march repeat ucx  4. Transaminitis F/u cmp today  5. History of herpes  genitalis  6. Low TSH With slightly elevated ft4 which is likely due to early pregnancy. Can repeat TFTs at 28wks.   7. History of G2 15wk loss Nothing to do currently.   Problem list reviewed and updated.  Follow up in 1 weeks.  >50% of 35 min visit spent on counseling and coordination of care.     Durene Romans MD Attending Center for [facility])

## 2022-10-10 ENCOUNTER — Non-Acute Institutional Stay (HOSPITAL_COMMUNITY)
Admission: RE | Admit: 2022-10-10 | Discharge: 2022-10-10 | Disposition: A | Discharge status: Home / Self Care | Payer: 59 | Source: Ambulatory Visit | Attending: Internal Medicine | Admitting: Internal Medicine

## 2022-10-10 DIAGNOSIS — Z3A00 Weeks of gestation of pregnancy not specified: Secondary | ICD-10-CM | POA: Diagnosis not present

## 2022-10-10 DIAGNOSIS — O211 Hyperemesis gravidarum with metabolic disturbance: Secondary | ICD-10-CM | POA: Insufficient documentation

## 2022-10-10 LAB — ANEMIA PROFILE B
Ferritin: 90 ng/mL (ref 15–150)
Folate: 9.6 ng/mL (ref >3.0)
Iron Saturation: 24 % (ref 15–55)
Iron: 94 ug/dL (ref 27–159)
Retic Ct Pct: 2.5 % (ref 0.6–2.6)
Total Iron Binding Capacity: 386 ug/dL (ref 250–450)
UIBC: 292 ug/dL (ref 131–425)
Vitamin B-12: 244 pg/mL (ref 232–1245)

## 2022-10-10 LAB — CBC/D/PLT+RPR+RH+ABO+RUBIGG...
Antibody Screen: NEGATIVE
Basophils Absolute: 0.1 10*3/uL (ref 0.0–0.2)
Basos: 0 %
EOS (ABSOLUTE): 0.1 10*3/uL (ref 0.0–0.4)
Eos: 1 %
HCV Ab: NONREACTIVE
HIV Screen 4th Generation wRfx: NONREACTIVE
Hematocrit: 32.9 % — ABNORMAL LOW (ref 34.0–46.6)
Hemoglobin: 10.8 g/dL — ABNORMAL LOW (ref 11.1–15.9)
Hepatitis B Surface Ag: NEGATIVE
Immature Grans (Abs): 0.1 10*3/uL (ref 0.0–0.1)
Immature Granulocytes: 1 %
Lymphocytes Absolute: 2.3 10*3/uL (ref 0.7–3.1)
Lymphs: 13 %
MCH: 27 pg (ref 26.6–33.0)
MCHC: 32.8 g/dL (ref 31.5–35.7)
MCV: 82 fL (ref 79–97)
Monocytes Absolute: 1.1 10*3/uL — ABNORMAL HIGH (ref 0.1–0.9)
Monocytes: 6 %
Neutrophils Absolute: 14 10*3/uL — ABNORMAL HIGH (ref 1.4–7.0)
Neutrophils: 79 %
Platelets: 786 10*3/uL — ABNORMAL HIGH (ref 150–450)
RBC: 4 x10E6/uL (ref 3.77–5.28)
RDW: 14.1 % (ref 11.7–15.4)
RPR Ser Ql: NONREACTIVE
Rh Factor: POSITIVE
Rubella Antibodies, IGG: 1.67 index (ref >0.99)
WBC: 17.7 10*3/uL — ABNORMAL HIGH (ref 3.4–10.8)

## 2022-10-10 LAB — COMPREHENSIVE METABOLIC PANEL
ALT: 51 IU/L — ABNORMAL HIGH (ref 0–32)
AST: 22 IU/L (ref 0–40)
Albumin/Globulin Ratio: 1.3 (ref 1.2–2.2)
Albumin: 3.9 g/dL (ref 3.9–4.9)
Alkaline Phosphatase: 121 IU/L (ref 44–121)
BUN/Creatinine Ratio: 9 (ref 9–23)
BUN: 5 mg/dL — ABNORMAL LOW (ref 6–20)
Bilirubin Total: 0.7 mg/dL (ref 0.0–1.2)
CO2: 22 mmol/L (ref 20–29)
Calcium: 9 mg/dL (ref 8.7–10.2)
Chloride: 99 mmol/L (ref 96–106)
Creatinine, Ser: 0.56 mg/dL — ABNORMAL LOW (ref 0.57–1.00)
Globulin, Total: 2.9 g/dL (ref 1.5–4.5)
Glucose: 91 mg/dL (ref 70–99)
Potassium: 4.5 mmol/L (ref 3.5–5.2)
Sodium: 135 mmol/L (ref 134–144)
Total Protein: 6.8 g/dL (ref 6.0–8.5)
eGFR: 124 mL/min/{1.73_m2} (ref >59)

## 2022-10-10 LAB — MAGNESIUM: Magnesium: 1.7 mg/dL (ref 1.6–2.3)

## 2022-10-10 LAB — HEMOGLOBIN A1C
Est. average glucose Bld gHb Est-mCnc: 103 mg/dL
Hgb A1c MFr Bld: 5.2 % (ref 4.8–5.6)

## 2022-10-10 LAB — HCV INTERPRETATION

## 2022-10-10 MED ORDER — SODIUM CHLORIDE 0.9 % IV SOLN: INTRAVENOUS | Status: DC | PRN

## 2022-10-10 MED ORDER — SODIUM CHLORIDE 0.9 % IV SOLN
8.0000 mg | Freq: Once | INTRAVENOUS | Status: AC
  Administered 2022-10-10: 8 mg via INTRAVENOUS
  Filled 2022-10-10: qty 4

## 2022-10-10 MED ORDER — PANTOPRAZOLE SODIUM 40 MG IV SOLR
40.0000 mg | Freq: Once | INTRAVENOUS | Status: AC
  Administered 2022-10-10: 40 mg via INTRAVENOUS
  Filled 2022-10-10: qty 10

## 2022-10-10 MED ORDER — SODIUM CHLORIDE 0.9 % IV SOLN
12.5000 mg | Freq: Once | INTRAVENOUS | Status: AC
  Administered 2022-10-10: 12.5 mg via INTRAVENOUS
  Filled 2022-10-10: qty 12.5

## 2022-10-10 MED ORDER — LACTATED RINGERS IV BOLUS
1000.0000 mL | Freq: Once | INTRAVENOUS | Status: AC
  Administered 2022-10-10: 1000 mL via INTRAVENOUS

## 2022-10-10 NOTE — Progress Notes (Signed)
PATIENT CARE CENTER NOTE  Diagnosis: Hyperemesis gravidarum with electrolyte imbalance [O21.1]     Provider: Hilda Blades MD  Procedure: Fluid Bolus and Anti-emetics (#2 of 30)   Note: Patient received 1 L Lactated Ringers fluid Bolus ran over 1 hour, IVBP Phenergan 12.5 mg, IVPB Zofran 8 mg, and IV Protonix 40 mg via PIV. Ordered weekly CMP to be drawn at next visit. Pt tolerated infusion with no adverse reaction. Vital signs stable. AVS offered, but pt declined. Pt advised that she is to come 2x weekly for a total of 30 doses, and that pt can schedule next appointment at front desk. Pt is alert, oriented and ambulatory at discharge.

## 2022-10-11 LAB — CYTOLOGY - PAP
Chlamydia: NEGATIVE
Comment: NEGATIVE
Comment: NEGATIVE
Comment: NEGATIVE
Comment: NORMAL
Diagnosis: NEGATIVE
High risk HPV: NEGATIVE
Neisseria Gonorrhea: NEGATIVE
Trichomonas: NEGATIVE

## 2022-10-11 LAB — CULTURE, OB URINE

## 2022-10-11 LAB — URINE CULTURE, OB REFLEX

## 2022-10-14 LAB — PANORAMA PRENATAL TEST FULL PANEL:PANORAMA TEST PLUS 5 ADDITIONAL MICRODELETIONS: FETAL FRACTION: 4.9

## 2022-10-15 LAB — HORIZON CUSTOM

## 2022-10-17 ENCOUNTER — Non-Acute Institutional Stay (HOSPITAL_COMMUNITY)
Admission: RE | Admit: 2022-10-17 | Discharge: 2022-10-17 | Disposition: A | Discharge status: Home / Self Care | Payer: 59 | Source: Ambulatory Visit | Attending: Internal Medicine | Admitting: Internal Medicine

## 2022-10-17 VITALS — BP 104/60 | HR 93 | Temp 98.3°F | Resp 16

## 2022-10-17 DIAGNOSIS — O211 Hyperemesis gravidarum with metabolic disturbance: Secondary | ICD-10-CM

## 2022-10-17 LAB — COMPREHENSIVE METABOLIC PANEL
ALT: 15 U/L (ref 0–44)
AST: 17 U/L (ref 15–41)
Albumin: 3.8 g/dL (ref 3.5–5.0)
Alkaline Phosphatase: 99 U/L (ref 38–126)
Anion gap: 15 (ref 5–15)
BUN: 15 mg/dL (ref 6–20)
CO2: 23 mmol/L (ref 22–32)
Calcium: 9 mg/dL (ref 8.9–10.3)
Chloride: 97 mmol/L — ABNORMAL LOW (ref 98–111)
Creatinine, Ser: 0.86 mg/dL (ref 0.44–1.00)
GFR, Estimated: >60 mL/min (ref >60)
Glucose, Bld: 133 mg/dL — ABNORMAL HIGH (ref 70–99)
Potassium: 3.2 mmol/L — ABNORMAL LOW (ref 3.5–5.1)
Sodium: 135 mmol/L (ref 135–145)
Total Bilirubin: 1.3 mg/dL — ABNORMAL HIGH (ref 0.3–1.2)
Total Protein: 8.6 g/dL — ABNORMAL HIGH (ref 6.5–8.1)

## 2022-10-17 MED ORDER — SODIUM CHLORIDE 0.9 % IV SOLN: INTRAVENOUS | Status: DC | PRN

## 2022-10-17 MED ORDER — LACTATED RINGERS IV BOLUS
1000.0000 mL | Freq: Once | INTRAVENOUS | Status: AC
  Administered 2022-10-17: 1000 mL via INTRAVENOUS

## 2022-10-17 MED ORDER — ONDANSETRON 8 MG/NS 50 ML IVPB
8.0000 mg | Freq: Once | INTRAVENOUS | Status: AC
  Administered 2022-10-17: 8 mg via INTRAVENOUS
  Filled 2022-10-17: qty 8

## 2022-10-17 MED ORDER — SODIUM CHLORIDE 0.9 % IV SOLN
12.5000 mg | Freq: Once | INTRAVENOUS | Status: AC
  Administered 2022-10-17: 12.5 mg via INTRAVENOUS
  Filled 2022-10-17: qty 12.5

## 2022-10-17 MED ORDER — PANTOPRAZOLE SODIUM 40 MG IV SOLR
40.0000 mg | Freq: Once | INTRAVENOUS | Status: AC
  Administered 2022-10-17: 40 mg via INTRAVENOUS
  Filled 2022-10-17: qty 10

## 2022-10-17 NOTE — Progress Notes (Signed)
PATIENT CARE CENTER NOTE  Diagnosis: Hyperemesis gravidarum with electrolyte imbalance [O21.1]      Provider: Hilda Blades MD   Procedure:  Fluid Bolus and Anti-emetics (#3 of 30)    Note: Pt received 1 L Lactated Ringers Bolus ran over 1 hour, IVBP Phenergan 12.5 mg, IVPB Zofran '8mg'$ , and IV Protonix 40 mg via PIV. CMP (#2 of 3 weekly) drawn peripherally by phlebotomist. Pt tolerated infusion with no adverse reaction.Vital signs stable. AVS offered, but pt declined. Pt advised that she can schedule next appointment at front desk. Pt is alert, oriented and ambulatory at discharge.

## 2022-10-21 ENCOUNTER — Non-Acute Institutional Stay (HOSPITAL_COMMUNITY)
Admission: RE | Admit: 2022-10-21 | Discharge: 2022-10-21 | Disposition: A | Discharge status: Home / Self Care | Payer: 59 | Source: Ambulatory Visit | Attending: Internal Medicine | Admitting: Internal Medicine

## 2022-10-21 DIAGNOSIS — O211 Hyperemesis gravidarum with metabolic disturbance: Secondary | ICD-10-CM | POA: Diagnosis present

## 2022-10-21 MED ORDER — SODIUM CHLORIDE 0.9 % IV SOLN
12.5000 mg | Freq: Once | INTRAVENOUS | Status: AC
  Administered 2022-10-21: 12.5 mg via INTRAVENOUS
  Filled 2022-10-21: qty 12.5

## 2022-10-21 MED ORDER — LACTATED RINGERS IV BOLUS
1000.0000 mL | Freq: Once | INTRAVENOUS | Status: AC
  Administered 2022-10-21: 1000 mL via INTRAVENOUS

## 2022-10-21 MED ORDER — SODIUM CHLORIDE 0.9 % IV SOLN
8.0000 mg | Freq: Once | INTRAVENOUS | Status: AC
  Administered 2022-10-21: 8 mg via INTRAVENOUS
  Filled 2022-10-21: qty 4

## 2022-10-21 MED ORDER — PANTOPRAZOLE SODIUM 40 MG IV SOLR
40.0000 mg | Freq: Once | INTRAVENOUS | Status: AC
  Administered 2022-10-21: 40 mg via INTRAVENOUS
  Filled 2022-10-21: qty 10

## 2022-10-21 MED ORDER — SODIUM CHLORIDE 0.9 % IV SOLN: INTRAVENOUS | Status: DC | PRN

## 2022-10-21 NOTE — Progress Notes (Signed)
PATIENT CARE CENTER NOTE  Diagnosis: Hyperemesis gravidarum with electrolyte imbalance [O21.1]       Provider: Lauretta Chester MD  Procedure: Fluid Bolus and Anti-emetics (#4 of 30)   Note: Pt received 1 Litre Lactated Ringers fluid bolus ran over 1 hour, IVBP Phenergan 12.5 mg, IVPB Zofran 8 mg, and IV Protonix 40 mg. Weekly CMP due to be drawn at patients next visit. Pt tolerated infusion with no adverse reaction. Vital signs stable. AVS offered, but pt declined. Pt advised that she can schedule next appointment at front desk. Pt is alert, oriented and ambulatory at discharge.

## 2022-10-24 ENCOUNTER — Non-Acute Institutional Stay (HOSPITAL_COMMUNITY)
Admission: RE | Admit: 2022-10-24 | Discharge: 2022-10-24 | Disposition: A | Discharge status: Home / Self Care | Payer: 59 | Source: Ambulatory Visit | Attending: Internal Medicine | Admitting: Internal Medicine

## 2022-10-24 ENCOUNTER — Encounter: Payer: Self-pay | Admitting: Obstetrics and Gynecology

## 2022-10-24 VITALS — BP 110/68 | HR 75 | Temp 97.7°F | Resp 16

## 2022-10-24 DIAGNOSIS — Z3A00 Weeks of gestation of pregnancy not specified: Secondary | ICD-10-CM | POA: Insufficient documentation

## 2022-10-24 DIAGNOSIS — O211 Hyperemesis gravidarum with metabolic disturbance: Secondary | ICD-10-CM | POA: Diagnosis present

## 2022-10-24 LAB — COMPREHENSIVE METABOLIC PANEL
ALT: 15 U/L (ref 0–44)
AST: 18 U/L (ref 15–41)
Albumin: 3 g/dL — ABNORMAL LOW (ref 3.5–5.0)
Alkaline Phosphatase: 77 U/L (ref 38–126)
Anion gap: 6 (ref 5–15)
BUN: 9 mg/dL (ref 6–20)
CO2: 23 mmol/L (ref 22–32)
Calcium: 8.2 mg/dL — ABNORMAL LOW (ref 8.9–10.3)
Chloride: 101 mmol/L (ref 98–111)
Creatinine, Ser: 0.65 mg/dL (ref 0.44–1.00)
GFR, Estimated: >60 mL/min (ref >60)
Glucose, Bld: 125 mg/dL — ABNORMAL HIGH (ref 70–99)
Potassium: 3.1 mmol/L — ABNORMAL LOW (ref 3.5–5.1)
Sodium: 130 mmol/L — ABNORMAL LOW (ref 135–145)
Total Bilirubin: 1 mg/dL (ref 0.3–1.2)
Total Protein: 6.7 g/dL (ref 6.5–8.1)

## 2022-10-24 MED ORDER — SODIUM CHLORIDE 0.9 % IV SOLN
12.5000 mg | Freq: Once | INTRAVENOUS | Status: AC
  Administered 2022-10-24: 12.5 mg via INTRAVENOUS
  Filled 2022-10-24: qty 12.5

## 2022-10-24 MED ORDER — SODIUM CHLORIDE 0.9 % IV SOLN
8.0000 mg | Freq: Once | INTRAVENOUS | Status: DC
  Filled 2022-10-24: qty 4

## 2022-10-24 MED ORDER — SODIUM CHLORIDE 0.9 % IV SOLN: INTRAVENOUS | Status: DC | PRN

## 2022-10-24 MED ORDER — PANTOPRAZOLE SODIUM 40 MG IV SOLR
40.0000 mg | Freq: Once | INTRAVENOUS | Status: AC
  Administered 2022-10-24: 40 mg via INTRAVENOUS
  Filled 2022-10-24: qty 10

## 2022-10-24 MED ORDER — ONDANSETRON 8 MG/NS 50 ML IVPB
8.0000 mg | Freq: Once | INTRAVENOUS | Status: AC
  Administered 2022-10-24: 8 mg via INTRAVENOUS
  Filled 2022-10-24: qty 54

## 2022-10-24 MED ORDER — LACTATED RINGERS IV BOLUS
1000.0000 mL | Freq: Once | INTRAVENOUS | Status: AC
  Administered 2022-10-24: 1000 mL via INTRAVENOUS

## 2022-10-24 NOTE — Progress Notes (Signed)
PATIENT CARE CENTER NOTE  Diagnosis: Hyperemesis gravidarum with electrolyte imbalance [O21.1]        Provider: Lauretta Chester MD  Procedure: Fluid Bolus and anti-emetics (#5 of 30)  Note: Patient received 1 Litre Lactated Ringers fluid bolus ran over 1 hour, IVBP Phenergan 12.5 mg, IVPB Zofran 8 mg, and IV Protonix 40 mg via PIV. CMP (#3 of 3) drawn peripherally by phlebotomist. Pt tolerated infusion with no adverse reaction. Vital signs stable. AVS offered, but pt declined. Pt advised to schedule next appointment at front desk. Pt is alert, oriented and ambulatory at discharge.

## 2022-10-28 ENCOUNTER — Encounter: Payer: 59 | Admitting: Obstetrics & Gynecology

## 2022-10-29 ENCOUNTER — Non-Acute Institutional Stay (HOSPITAL_COMMUNITY)
Admission: RE | Admit: 2022-10-29 | Discharge: 2022-10-29 | Disposition: A | Discharge status: Home / Self Care | Payer: 59 | Source: Ambulatory Visit | Attending: Internal Medicine | Admitting: Internal Medicine

## 2022-10-29 DIAGNOSIS — O211 Hyperemesis gravidarum with metabolic disturbance: Secondary | ICD-10-CM | POA: Diagnosis present

## 2022-10-29 DIAGNOSIS — Z3A00 Weeks of gestation of pregnancy not specified: Secondary | ICD-10-CM | POA: Insufficient documentation

## 2022-10-29 MED ORDER — SODIUM CHLORIDE 0.9 % IV SOLN
12.5000 mg | Freq: Once | INTRAVENOUS | Status: AC
  Administered 2022-10-29: 12.5 mg via INTRAVENOUS
  Filled 2022-10-29: qty 12.5

## 2022-10-29 MED ORDER — LACTATED RINGERS IV BOLUS
1000.0000 mL | Freq: Once | INTRAVENOUS | Status: AC
  Administered 2022-10-29: 1000 mL via INTRAVENOUS

## 2022-10-29 MED ORDER — SODIUM CHLORIDE 0.9 % IV SOLN: INTRAVENOUS | Status: DC | PRN

## 2022-10-29 MED ORDER — ONDANSETRON 8 MG/NS 50 ML IVPB
8.0000 mg | Freq: Once | INTRAVENOUS | Status: AC
  Administered 2022-10-29: 8 mg via INTRAVENOUS
  Filled 2022-10-29: qty 8

## 2022-10-29 MED ORDER — PANTOPRAZOLE SODIUM 40 MG IV SOLR
40.0000 mg | Freq: Once | INTRAVENOUS | Status: AC
  Administered 2022-10-29: 40 mg via INTRAVENOUS
  Filled 2022-10-29: qty 10

## 2022-10-29 MED ORDER — SODIUM CHLORIDE 0.9 % IV SOLN
8.0000 mg | Freq: Once | INTRAVENOUS | Status: DC
  Filled 2022-10-29: qty 4

## 2022-10-29 NOTE — Progress Notes (Signed)
PATIENT CARE CENTER NOTE  Diagnosis: Hyperemesis gravidarum with electrolyte imbalance [O21.1]       Provider: Lauretta Chester MD  Procedure: Fluid Bolus and anti-emetics (#6 of 30)  Note: Patient received 1 Litre Lactated Ringers fluid bolus ran over 1 hour, IVPB Phenragan 12.5 mg, IVBP Zofran 8 mg, and IV Protonix 40 mg via PIV. Pt tolerated infusion with no adverse reaction. Vital signs stable. AVS offered, but pt declined. Pt is alert, oriented, and ambulatory at discharge.

## 2022-10-31 ENCOUNTER — Non-Acute Institutional Stay (HOSPITAL_COMMUNITY)
Admission: RE | Admit: 2022-10-31 | Discharge: 2022-10-31 | Disposition: A | Discharge status: Home / Self Care | Payer: 59 | Source: Ambulatory Visit | Attending: Internal Medicine | Admitting: Internal Medicine

## 2022-10-31 DIAGNOSIS — O211 Hyperemesis gravidarum with metabolic disturbance: Secondary | ICD-10-CM | POA: Insufficient documentation

## 2022-10-31 MED ORDER — SODIUM CHLORIDE 0.9 % IV SOLN
8.0000 mg | Freq: Once | INTRAVENOUS | Status: DC
  Filled 2022-10-31: qty 4

## 2022-10-31 MED ORDER — LACTATED RINGERS IV BOLUS
1000.0000 mL | Freq: Once | INTRAVENOUS | Status: AC
  Administered 2022-10-31: 1000 mL via INTRAVENOUS

## 2022-10-31 MED ORDER — ONDANSETRON 8 MG/NS 50 ML IVPB
8.0000 mg | Freq: Once | INTRAVENOUS | Status: AC
  Administered 2022-10-31: 8 mg via INTRAVENOUS
  Filled 2022-10-31: qty 8

## 2022-10-31 MED ORDER — SODIUM CHLORIDE 0.9 % IV SOLN
12.5000 mg | Freq: Four times a day (QID) | INTRAVENOUS | Status: DC | PRN
  Administered 2022-10-31: 12.5 mg via INTRAVENOUS
  Filled 2022-10-31: qty 12.5

## 2022-10-31 MED ORDER — PANTOPRAZOLE SODIUM 40 MG IV SOLR
40.0000 mg | Freq: Once | INTRAVENOUS | Status: AC
  Administered 2022-10-31: 40 mg via INTRAVENOUS
  Filled 2022-10-31: qty 10

## 2022-10-31 MED ORDER — SODIUM CHLORIDE 0.9 % IV SOLN: INTRAVENOUS | Status: DC | PRN

## 2022-10-31 NOTE — Progress Notes (Signed)
PATIENT CARE CENTER NOTE     Diagnosis: Hyperemesis gravidarum with electrolyte imbalance [O21.1]        Provider: Lauretta Chester MD   Procedure: Fluid Bolus and anti-emetics (Dose #7 of 30)   Note: Patient received 1 Liter Lactated Ringers fluid bolus  over 1 hour, IVPB Phenragan 12.5 mg, IVBP Zofran 8 mg, and IV Protonix 40 mg via PIV. Pt tolerated infusion and medications well with no adverse reaction. Vital signs stable. Patient can come twice a week for a total of 30 doses. AVS offered, but pt declined. Pt is alert, oriented, and ambulatory at discharge.

## 2022-11-04 ENCOUNTER — Non-Acute Institutional Stay (HOSPITAL_COMMUNITY)
Admission: RE | Admit: 2022-11-04 | Discharge: 2022-11-04 | Disposition: A | Discharge status: Home / Self Care | Payer: 59 | Source: Ambulatory Visit | Attending: Internal Medicine | Admitting: Internal Medicine

## 2022-11-04 ENCOUNTER — Encounter: Payer: Self-pay | Admitting: Obstetrics and Gynecology

## 2022-11-04 ENCOUNTER — Ambulatory Visit (INDEPENDENT_AMBULATORY_CARE_PROVIDER_SITE_OTHER): Payer: 59 | Admitting: Obstetrics and Gynecology

## 2022-11-04 VITALS — BP 117/80 | HR 57 | Wt 143.0 lb

## 2022-11-04 DIAGNOSIS — Z3A15 15 weeks gestation of pregnancy: Secondary | ICD-10-CM

## 2022-11-04 DIAGNOSIS — O0992 Supervision of high risk pregnancy, unspecified, second trimester: Secondary | ICD-10-CM

## 2022-11-04 DIAGNOSIS — O211 Hyperemesis gravidarum with metabolic disturbance: Secondary | ICD-10-CM | POA: Diagnosis present

## 2022-11-04 DIAGNOSIS — Z8619 Personal history of other infectious and parasitic diseases: Secondary | ICD-10-CM

## 2022-11-04 DIAGNOSIS — Z8759 Personal history of other complications of pregnancy, childbirth and the puerperium: Secondary | ICD-10-CM

## 2022-11-04 DIAGNOSIS — O0990 Supervision of high risk pregnancy, unspecified, unspecified trimester: Secondary | ICD-10-CM

## 2022-11-04 DIAGNOSIS — R7989 Other specified abnormal findings of blood chemistry: Secondary | ICD-10-CM

## 2022-11-04 MED ORDER — PANTOPRAZOLE SODIUM 40 MG IV SOLR
40.0000 mg | Freq: Once | INTRAVENOUS | Status: AC
  Administered 2022-11-04: 40 mg via INTRAVENOUS
  Filled 2022-11-04: qty 10

## 2022-11-04 MED ORDER — SODIUM CHLORIDE 0.9 % IV SOLN
8.0000 mg | Freq: Once | INTRAVENOUS | Status: AC
  Administered 2022-11-04: 8 mg via INTRAVENOUS
  Filled 2022-11-04: qty 4

## 2022-11-04 MED ORDER — SODIUM CHLORIDE 0.9 % IV SOLN: INTRAVENOUS | Status: DC | PRN

## 2022-11-04 MED ORDER — LACTATED RINGERS IV BOLUS
1000.0000 mL | Freq: Once | INTRAVENOUS | Status: AC
  Administered 2022-11-04: 1000 mL via INTRAVENOUS

## 2022-11-04 MED ORDER — SODIUM CHLORIDE 0.9 % IV SOLN
12.5000 mg | Freq: Once | INTRAVENOUS | Status: AC
  Administered 2022-11-04: 12.5 mg via INTRAVENOUS
  Filled 2022-11-04: qty 12.5

## 2022-11-04 NOTE — Progress Notes (Signed)
PATIENT CARE CENTER NOTE  Diagnosis: Hyperemesis gravidarum with electrolyte imbalance [O21.1]       Provider: Lauretta Chester MD  Procedure: Fluid Bolus and anti-emetics (dose #8 of 30)  Note: Pt received 1 Litre Lactated Ringers fluid bolus ran over 1 hour, IVPB Phenergan 12.5 mg, IVPB Zofran 8 mg, and IV Protonix 40 mg via PIV. Pt tolerated infusion and medications with no adverse reaction. Vital signs stable. Pt can come twice a week for a total of 30 doses, and advised to schedule next appointment at front desk. AVS offered, but pt declined. Pt is alert, oriented, and ambulatory at discharge.

## 2022-11-04 NOTE — Progress Notes (Signed)
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  She is currently monitored for the following issues for this high-risk pregnancy and has History of 15wk loss (2020); Depression; Low TSH level; Tourette's disease; Hyperemesis gravidarum with electrolyte imbalance; Supervision of high risk pregnancy, antepartum; and History of herpes genitalis on their problem list.  Patient reports  continues with N/V. Weekly IV infusions .  Contractions: Not present. Vag. Bleeding: None.  Movement: Absent. Denies leaking of fluid.   The following portions of the patient's history were reviewed and updated as appropriate: allergies, current medications, past family history, past medical history, past social history, past surgical history and problem list. Problem list updated.  Objective:   Vitals:   11/04/22 1353  BP: 117/80  Pulse: (!) 57  Weight: 143 lb (64.9 kg)    Fetal Status: Fetal Heart Rate (bpm): 160   Movement: Absent     General:  Alert, oriented and cooperative. Patient is in no acute distress.  Skin: Skin is warm and dry. No rash noted.   Cardiovascular: Normal heart rate noted  Respiratory: Normal respiratory effort, no problems with respiration noted  Abdomen: Soft, gravid, appropriate for gestational age. Pain/Pressure: Absent     Pelvic:  Cervical exam deferred        Extremities: Normal range of motion.  Edema: None  Mental Status: Normal mood and affect. Normal behavior. Normal judgment and thought content.   Urinalysis:      Assessment and Plan:  Pregnancy: [id number] at [redacted]w[redacted]d  1. Supervision of high risk pregnancy, antepartum Stable AFP today Anatomy scan scheduled  2. Hyperemesis gravidarum with electrolyte imbalance Twice weekly infusion Out of work for now. Reevaluate return to work at next [facility] appt  3. History of herpes genitalis HSV suppression at 36 weeks  4. History of 15wk loss (2020) Stable   5. Low TSH  level TSH with Glucola   Preterm labor symptoms and general obstetric precautions including but not limited to vaginal bleeding, contractions, leaking of fluid and fetal movement were reviewed in detail with the patient. Please refer to After Visit Summary for other counseling recommendations.  Return in about 2 weeks (around 11/18/2022) for OB visit, face to face, MD only.   Chancy Milroy, MD

## 2022-11-06 ENCOUNTER — Encounter: Payer: Self-pay | Admitting: Obstetrics and Gynecology

## 2022-11-06 ENCOUNTER — Encounter: Payer: Self-pay | Admitting: Obstetrics & Gynecology

## 2022-11-06 ENCOUNTER — Telehealth: Payer: Self-pay

## 2022-11-06 LAB — AFP, SERUM, OPEN SPINA BIFIDA
AFP MoM: 1.47
AFP Value: 48.9 ng/mL
Gest. Age on Collection Date: 15 weeks
Maternal Age At EDD: 33.1 yr
OSBR Risk 1 IN: 5931
Test Results:: NEGATIVE
Weight: 143 [lb_av]

## 2022-11-06 NOTE — Telephone Encounter (Signed)
Call placed to pt. Spoke with pt. Pt given results per Dr Ilda Basset. Pt verbalized understanding. Pt states can't come before next OB appt on 4/8. Advised will repeat labs at this appt. Pt agreeable to plan of care.  Pt also asked about normal AFP results.  Colletta Maryland, RNC

## 2022-11-06 NOTE — Telephone Encounter (Signed)
-----   Message from Aletha Halim, MD sent at 11/06/2022 12:45 PM EDT ----- Can you let her know that her platelets need to be re-checked to see if they're still high? Can you ask her to come in anytime for a CBC? thanks

## 2022-11-07 ENCOUNTER — Non-Acute Institutional Stay (HOSPITAL_COMMUNITY)
Admission: RE | Admit: 2022-11-07 | Discharge: 2022-11-07 | Disposition: A | Discharge status: Home / Self Care | Payer: 59 | Source: Ambulatory Visit | Attending: Internal Medicine | Admitting: Internal Medicine

## 2022-11-07 DIAGNOSIS — Z3A00 Weeks of gestation of pregnancy not specified: Secondary | ICD-10-CM | POA: Diagnosis not present

## 2022-11-07 DIAGNOSIS — O211 Hyperemesis gravidarum with metabolic disturbance: Secondary | ICD-10-CM | POA: Insufficient documentation

## 2022-11-07 MED ORDER — SODIUM CHLORIDE 0.9 % IV SOLN
8.0000 mg | Freq: Once | INTRAVENOUS | Status: AC
  Administered 2022-11-07: 8 mg via INTRAVENOUS
  Filled 2022-11-07: qty 4

## 2022-11-07 MED ORDER — SODIUM CHLORIDE 0.9 % IV SOLN: INTRAVENOUS | Status: DC | PRN

## 2022-11-07 MED ORDER — PANTOPRAZOLE SODIUM 40 MG IV SOLR
40.0000 mg | Freq: Once | INTRAVENOUS | Status: AC
  Administered 2022-11-07: 40 mg via INTRAVENOUS
  Filled 2022-11-07: qty 10

## 2022-11-07 MED ORDER — LACTATED RINGERS IV BOLUS
1000.0000 mL | Freq: Once | INTRAVENOUS | Status: AC
  Administered 2022-11-07: 1000 mL via INTRAVENOUS

## 2022-11-07 MED ORDER — SODIUM CHLORIDE 0.9 % IV SOLN
12.5000 mg | Freq: Once | INTRAVENOUS | Status: AC
  Administered 2022-11-07: 12.5 mg via INTRAVENOUS
  Filled 2022-11-07: qty 12.5

## 2022-11-07 NOTE — Progress Notes (Signed)
PATIENT CARE CENTER NOTE  Diagnosis: Hyperemesis gravidarum with electrolyte imbalance [O21.1]     Provider: Lauretta Chester MD  Procedure: Fluid Bolus and anti-emetics (dose #9 of 30)  Note: Pt received 1 Litre Lactated Ringers fluid bolus ran over 1 hour, IVPB Phenergan 12.5 mg, IVPB Zofran 8 mg, and IV Protonix 40 mg via PIV. Pt tolerated infusion and medications with no adverse reaction. Vital signs stable. Pt can come twice a week for a total of 30 doses. AVS offered, but pt declined. Pt is alert, oriented, and ambulatory at discharge.

## 2022-11-11 ENCOUNTER — Non-Acute Institutional Stay (HOSPITAL_COMMUNITY)
Admission: RE | Admit: 2022-11-11 | Discharge: 2022-11-11 | Disposition: A | Discharge status: Home / Self Care | Payer: 59 | Source: Ambulatory Visit | Attending: Internal Medicine | Admitting: Internal Medicine

## 2022-11-11 DIAGNOSIS — O211 Hyperemesis gravidarum with metabolic disturbance: Secondary | ICD-10-CM | POA: Diagnosis not present

## 2022-11-11 DIAGNOSIS — Z3A00 Weeks of gestation of pregnancy not specified: Secondary | ICD-10-CM | POA: Diagnosis not present

## 2022-11-11 MED ORDER — SODIUM CHLORIDE 0.9 % IV SOLN: INTRAVENOUS | Status: DC | PRN

## 2022-11-11 MED ORDER — PANTOPRAZOLE SODIUM 40 MG IV SOLR
40.0000 mg | Freq: Once | INTRAVENOUS | Status: AC
  Administered 2022-11-11: 40 mg via INTRAVENOUS
  Filled 2022-11-11: qty 10

## 2022-11-11 MED ORDER — ONDANSETRON 8 MG/NS 50 ML IVPB
8.0000 mg | Freq: Once | INTRAVENOUS | Status: AC
  Administered 2022-11-11: 8 mg via INTRAVENOUS
  Filled 2022-11-11: qty 8

## 2022-11-11 MED ORDER — SODIUM CHLORIDE 0.9 % IV SOLN
8.0000 mg | Freq: Once | INTRAVENOUS | Status: DC
  Filled 2022-11-11: qty 4

## 2022-11-11 MED ORDER — SODIUM CHLORIDE 0.9 % IV SOLN
12.5000 mg | Freq: Once | INTRAVENOUS | Status: AC
  Administered 2022-11-11: 12.5 mg via INTRAVENOUS
  Filled 2022-11-11: qty 12.5

## 2022-11-11 MED ORDER — LACTATED RINGERS IV BOLUS
1000.0000 mL | Freq: Once | INTRAVENOUS | Status: AC
  Administered 2022-11-11: 1000 mL via INTRAVENOUS

## 2022-11-11 NOTE — Progress Notes (Signed)
PATIENT CARE CENTER NOTE     Diagnosis: Hyperemesis gravidarum with electrolyte imbalance [O21.1]        Provider: Lauretta Chester MD   Procedure: Fluid Bolus and anti-emetics (Dose #10 of 30)   Note: Patient received 1 Liter Lactated Ringers fluid bolus  over 1 hour, IVPB Phenergan 12.5 mg, IVBP Zofran 8 mg, and IV Protonix 40 mg via PIV. Patient tolerated infusion and medications well with no adverse reaction. Vital signs stable. Patient can come twice a week for a total of 30 doses. AVS offered, but patient declined. Patient alert, oriented, and ambulatory at discharge.

## 2022-11-14 ENCOUNTER — Encounter (HOSPITAL_COMMUNITY): Payer: 59

## 2022-11-15 ENCOUNTER — Non-Acute Institutional Stay (HOSPITAL_COMMUNITY)
Admission: RE | Admit: 2022-11-15 | Discharge: 2022-11-15 | Disposition: A | Discharge status: Home / Self Care | Payer: 59 | Source: Ambulatory Visit | Attending: Internal Medicine | Admitting: Internal Medicine

## 2022-11-15 DIAGNOSIS — O211 Hyperemesis gravidarum with metabolic disturbance: Secondary | ICD-10-CM | POA: Diagnosis present

## 2022-11-15 MED ORDER — ONDANSETRON 8 MG/NS 50 ML IVPB
8.0000 mg | Freq: Once | INTRAVENOUS | Status: AC
  Administered 2022-11-15: 8 mg via INTRAVENOUS
  Filled 2022-11-15: qty 8

## 2022-11-15 MED ORDER — SODIUM CHLORIDE 0.9 % IV SOLN
8.0000 mg | Freq: Once | INTRAVENOUS | Status: DC
  Filled 2022-11-15: qty 4

## 2022-11-15 MED ORDER — PANTOPRAZOLE SODIUM 40 MG IV SOLR
40.0000 mg | Freq: Once | INTRAVENOUS | Status: AC
  Administered 2022-11-15: 40 mg via INTRAVENOUS
  Filled 2022-11-15: qty 10

## 2022-11-15 MED ORDER — SODIUM CHLORIDE 0.9 % IV SOLN: INTRAVENOUS | Status: DC | PRN

## 2022-11-15 MED ORDER — SODIUM CHLORIDE 0.9 % IV SOLN
12.5000 mg | Freq: Once | INTRAVENOUS | Status: AC
  Administered 2022-11-15: 12.5 mg via INTRAVENOUS
  Filled 2022-11-15: qty 12.5

## 2022-11-15 MED ORDER — LACTATED RINGERS IV BOLUS
1000.0000 mL | Freq: Once | INTRAVENOUS | Status: AC
  Administered 2022-11-15: 1000 mL via INTRAVENOUS

## 2022-11-15 NOTE — Progress Notes (Signed)
PATIENT CARE CENTER NOTE  Diagnosis: Hyperemesis gravidarum with electrolyte imbalance [O21.1]      Provider: Lyndel Safe MD  Procedure: Fluid Bolus and anti-emetics(dose #11 of 30)  Note: Pt received 1 Litre Lactated Ringers fluid bolus over 1 hour, IVPB Phenergan 12.5 mg, IVPB Zofran 8 mg, and IV Protonix 40 mg via PIV. Pt tolerated infusion and medications with no adverse reaction. Vital signs stable. Pt can come twice a week for a total of 30 doses. AVS offered, but pt declined. Pt is alert, oriented, and ambulatory at discharge.

## 2022-11-18 ENCOUNTER — Ambulatory Visit (INDEPENDENT_AMBULATORY_CARE_PROVIDER_SITE_OTHER): Payer: 59 | Admitting: Medical

## 2022-11-18 ENCOUNTER — Encounter: Payer: Self-pay | Admitting: Medical

## 2022-11-18 ENCOUNTER — Other Ambulatory Visit: Payer: Self-pay

## 2022-11-18 VITALS — BP 107/63 | HR 86 | Wt 143.2 lb

## 2022-11-18 DIAGNOSIS — F3289 Other specified depressive episodes: Secondary | ICD-10-CM

## 2022-11-18 DIAGNOSIS — Z8759 Personal history of other complications of pregnancy, childbirth and the puerperium: Secondary | ICD-10-CM

## 2022-11-18 DIAGNOSIS — Z3A17 17 weeks gestation of pregnancy: Secondary | ICD-10-CM

## 2022-11-18 DIAGNOSIS — O0992 Supervision of high risk pregnancy, unspecified, second trimester: Secondary | ICD-10-CM

## 2022-11-18 DIAGNOSIS — O0990 Supervision of high risk pregnancy, unspecified, unspecified trimester: Secondary | ICD-10-CM

## 2022-11-18 DIAGNOSIS — Z8619 Personal history of other infectious and parasitic diseases: Secondary | ICD-10-CM

## 2022-11-18 DIAGNOSIS — O211 Hyperemesis gravidarum with metabolic disturbance: Secondary | ICD-10-CM

## 2022-11-18 DIAGNOSIS — F952 Tourette's disorder: Secondary | ICD-10-CM

## 2022-11-18 DIAGNOSIS — D75839 Thrombocytosis, unspecified: Secondary | ICD-10-CM

## 2022-11-18 DIAGNOSIS — R7989 Other specified abnormal findings of blood chemistry: Secondary | ICD-10-CM

## 2022-11-19 ENCOUNTER — Non-Acute Institutional Stay (HOSPITAL_COMMUNITY)
Admission: RE | Admit: 2022-11-19 | Discharge: 2022-11-19 | Disposition: A | Discharge status: Home / Self Care | Payer: 59 | Source: Ambulatory Visit | Attending: Internal Medicine | Admitting: Internal Medicine

## 2022-11-19 DIAGNOSIS — O211 Hyperemesis gravidarum with metabolic disturbance: Secondary | ICD-10-CM | POA: Diagnosis present

## 2022-11-19 DIAGNOSIS — Z3A00 Weeks of gestation of pregnancy not specified: Secondary | ICD-10-CM | POA: Insufficient documentation

## 2022-11-19 LAB — CBC
Hematocrit: 33.2 % — ABNORMAL LOW (ref 34.0–46.6)
Hemoglobin: 10.8 g/dL — ABNORMAL LOW (ref 11.1–15.9)
MCH: 26.9 pg (ref 26.6–33.0)
MCHC: 32.5 g/dL (ref 31.5–35.7)
MCV: 83 fL (ref 79–97)
Platelets: 738 10*3/uL — ABNORMAL HIGH (ref 150–450)
RBC: 4.02 x10E6/uL (ref 3.77–5.28)
RDW: 13.8 % (ref 11.7–15.4)
WBC: 11.5 10*3/uL — ABNORMAL HIGH (ref 3.4–10.8)

## 2022-11-19 MED ORDER — SODIUM CHLORIDE 0.9 % IV SOLN: INTRAVENOUS | Status: DC | PRN

## 2022-11-19 MED ORDER — SODIUM CHLORIDE 0.9 % IV SOLN
8.0000 mg | Freq: Once | INTRAVENOUS | Status: AC
  Administered 2022-11-19: 8 mg via INTRAVENOUS
  Filled 2022-11-19: qty 4

## 2022-11-19 MED ORDER — SODIUM CHLORIDE 0.9 % IV SOLN
12.5000 mg | Freq: Once | INTRAVENOUS | Status: AC
  Administered 2022-11-19: 12.5 mg via INTRAVENOUS
  Filled 2022-11-19: qty 12.5

## 2022-11-19 MED ORDER — PANTOPRAZOLE SODIUM 40 MG IV SOLR
40.0000 mg | Freq: Once | INTRAVENOUS | Status: AC
  Administered 2022-11-19: 40 mg via INTRAVENOUS
  Filled 2022-11-19: qty 10

## 2022-11-19 MED ORDER — LACTATED RINGERS IV BOLUS
1000.0000 mL | Freq: Once | INTRAVENOUS | Status: AC
  Administered 2022-11-19: 1000 mL via INTRAVENOUS

## 2022-11-19 NOTE — Progress Notes (Signed)
   PRENATAL VISIT NOTE  Subjective:  Jody Robinson is a 32 y.o. [id number] at [redacted]w[redacted]d being seen today for ongoing prenatal care.  She is currently monitored for the following issues for this high-risk pregnancy and has History of 15wk loss (2020); Depression; Elevated liver enzymes; Thrombocytosis; Low TSH level; Tourette's disease; Hyperemesis gravidarum with electrolyte imbalance; Supervision of high risk pregnancy, antepartum; and History of herpes genitalis on their problem list.  Patient reports no complaints.  Contractions: Not present. Vag. Bleeding: None.  Movement: Present. Denies leaking of fluid.   The following portions of the patient's history were reviewed and updated as appropriate: allergies, current medications, past family history, past medical history, past social history, past surgical history and problem list.   Objective:   Vitals:   11/18/22 1514  BP: 107/63  Pulse: 86  Weight: 143 lb 3.2 oz (65 kg)    Fetal Status: Fetal Heart Rate (bpm): 161   Movement: Present     General:  Alert, oriented and cooperative. Patient is in no acute distress.  Skin: Skin is warm and dry. No rash noted.   Cardiovascular: Normal heart rate noted  Respiratory: Normal respiratory effort, no problems with respiration noted  Abdomen: Soft, gravid, appropriate for gestational age.  Pain/Pressure: Absent     Pelvic: Cervical exam deferred        Extremities: Normal range of motion.  Edema: None  Mental Status: Normal mood and affect. Normal behavior. Normal judgment and thought content.   Assessment and Plan:  Pregnancy: [id number] at [redacted]w[redacted]d 1. Supervision of high risk pregnancy, antepartum - Anatomy US scheduled 4/22  2. History of herpes genitalis - Ppx at 34-36 weeks   3. Other depression  4. Hyperemesis gravidarum with electrolyte imbalance - Twice weekly infusions will continue, weight is stable since last visit and patient endorses symptom improvement   5. Thrombocytosis -  CBC  6. Tourette's disease  7. Low TSH level - recheck at 28 weeks   8. History of 15wk loss (2020)  9. [redacted] weeks gestation of pregnancy  Preterm labor symptoms and general obstetric precautions including but not limited to vaginal bleeding, contractions, leaking of fluid and fetal movement were reviewed in detail with the patient. Please refer to After Visit Summary for other counseling recommendations.   Return in about 4 weeks (around 12/16/2022) for [facility] APP, In-Person.  Future Appointments  Date Time Provider Department Center  11/19/2022 10:00 AM [facility] RM 2 [facility] None  12/02/2022  9:30 AM [facility] NURSE [facility]  12/02/2022  9:45 AM [facility] US5 [facility]  12/16/2022 11:15 AM Warden Fillers, MD [facility]    Vonzella Nipple, PA-C

## 2022-11-19 NOTE — Progress Notes (Signed)
PATIENT CARE CENTER NOTE  Diagnosis: Hyperemesis gravidarum with electrolyte imbalance [O21.1]        Provider: Lyndel Safe MD   Procedure: Fluid Bolus and anti-emetics (#12 of 30)  Note: Patient received 1 Litre lactated Ringers fluid bolus over 1 hour, IVPB Phenergan 12.5 mg, IVPB Zofran 8 mg, and IV Protonix 40 mg via PIV. Pt tolerated infusion and medications well with no adverse reaction. Vital signs stable. Pt can come twice a week for a total of 30 doses. AVS offered, but pt declined. Pt is alert, oriented, and ambulatory at discharge.

## 2022-11-21 ENCOUNTER — Non-Acute Institutional Stay (HOSPITAL_COMMUNITY)
Admission: RE | Admit: 2022-11-21 | Discharge: 2022-11-21 | Disposition: A | Discharge status: Home / Self Care | Payer: 59 | Source: Ambulatory Visit | Attending: Internal Medicine | Admitting: Internal Medicine

## 2022-11-21 DIAGNOSIS — O211 Hyperemesis gravidarum with metabolic disturbance: Secondary | ICD-10-CM | POA: Insufficient documentation

## 2022-11-21 MED ORDER — SODIUM CHLORIDE 0.9 % IV SOLN
12.5000 mg | Freq: Once | INTRAVENOUS | Status: AC
  Administered 2022-11-21: 12.5 mg via INTRAVENOUS
  Filled 2022-11-21: qty 12.5

## 2022-11-21 MED ORDER — LACTATED RINGERS IV BOLUS
1000.0000 mL | Freq: Once | INTRAVENOUS | Status: AC
  Administered 2022-11-21: 1000 mL via INTRAVENOUS

## 2022-11-21 MED ORDER — PANTOPRAZOLE SODIUM 40 MG IV SOLR
40.0000 mg | Freq: Once | INTRAVENOUS | Status: AC
  Administered 2022-11-21: 40 mg via INTRAVENOUS
  Filled 2022-11-21: qty 10

## 2022-11-21 MED ORDER — ONDANSETRON 8 MG/NS 50 ML IVPB
8.0000 mg | Freq: Once | INTRAVENOUS | Status: AC
  Administered 2022-11-21: 8 mg via INTRAVENOUS
  Filled 2022-11-21: qty 54

## 2022-11-21 MED ORDER — SODIUM CHLORIDE 0.9 % IV SOLN
8.0000 mg | Freq: Once | INTRAVENOUS | Status: DC
  Filled 2022-11-21: qty 4

## 2022-11-21 MED ORDER — SODIUM CHLORIDE 0.9 % IV SOLN: INTRAVENOUS | Status: DC | PRN

## 2022-11-21 NOTE — Progress Notes (Signed)
PATIENT CARE CENTER NOTE     Diagnosis: Hyperemesis gravidarum with electrolyte imbalance [O21.1]        Provider: Lyndel Safe MD   Procedure: Fluid Bolus and anti-emetics (Dose #13 of 30)   Note: Patient received 1 Liter Lactated Ringers fluid bolus  over 1 hour, IVPB Phenragan 12.5 mg, IVBP Zofran 8 mg, and IV Protonix 40 mg via PIV. Patient tolerated infusion and medications well with no adverse reaction. Vital signs stable. Patient can come twice a week for a total of 30 doses. AVS offered but patient declined. Patient is alert, oriented, and ambulatory at discharge.

## 2022-11-24 ENCOUNTER — Inpatient Hospital Stay (HOSPITAL_COMMUNITY)
Admission: AD | Admit: 2022-11-24 | Discharge: 2022-11-24 | Disposition: A | Discharge status: Home / Self Care | Payer: 59 | Attending: Obstetrics & Gynecology | Admitting: Obstetrics & Gynecology

## 2022-11-24 ENCOUNTER — Encounter (HOSPITAL_COMMUNITY): Payer: Self-pay | Admitting: Obstetrics & Gynecology

## 2022-11-24 DIAGNOSIS — O99352 Diseases of the nervous system complicating pregnancy, second trimester: Secondary | ICD-10-CM | POA: Diagnosis not present

## 2022-11-24 DIAGNOSIS — Z3A17 17 weeks gestation of pregnancy: Secondary | ICD-10-CM

## 2022-11-24 DIAGNOSIS — O98512 Other viral diseases complicating pregnancy, second trimester: Secondary | ICD-10-CM | POA: Diagnosis not present

## 2022-11-24 DIAGNOSIS — G43909 Migraine, unspecified, not intractable, without status migrainosus: Secondary | ICD-10-CM

## 2022-11-24 DIAGNOSIS — R519 Headache, unspecified: Secondary | ICD-10-CM

## 2022-11-24 DIAGNOSIS — O26892 Other specified pregnancy related conditions, second trimester: Secondary | ICD-10-CM

## 2022-11-24 DIAGNOSIS — O219 Vomiting of pregnancy, unspecified: Secondary | ICD-10-CM | POA: Diagnosis not present

## 2022-11-24 HISTORY — DX: Migraine, unspecified, not intractable, without status migrainosus: G43.909

## 2022-11-24 LAB — URINALYSIS, ROUTINE W REFLEX MICROSCOPIC
Glucose, UA: NEGATIVE mg/dL
Hgb urine dipstick: NEGATIVE
Ketones, ur: 5 mg/dL — AB
Nitrite: NEGATIVE
Protein, ur: 100 mg/dL — AB
Specific Gravity, Urine: 1.023 (ref 1.005–1.030)
pH: 7 (ref 5.0–8.0)

## 2022-11-24 MED ORDER — DIPHENHYDRAMINE HCL 50 MG/ML IJ SOLN
25.0000 mg | Freq: Once | INTRAMUSCULAR | Status: AC
  Administered 2022-11-24: 25 mg via INTRAVENOUS
  Filled 2022-11-24: qty 1

## 2022-11-24 MED ORDER — ONDANSETRON 4 MG PO TBDP: 4.0000 mg | ORAL_TABLET | Freq: Four times a day (QID) | ORAL | 3 refills | Status: DC | PRN

## 2022-11-24 MED ORDER — METOCLOPRAMIDE HCL 5 MG/ML IJ SOLN
10.0000 mg | Freq: Once | INTRAMUSCULAR | Status: AC
  Administered 2022-11-24: 10 mg via INTRAVENOUS
  Filled 2022-11-24: qty 2

## 2022-11-24 MED ORDER — LACTATED RINGERS IV BOLUS
1000.0000 mL | Freq: Once | INTRAVENOUS | Status: AC
  Administered 2022-11-24: 1000 mL via INTRAVENOUS

## 2022-11-24 MED ORDER — ONDANSETRON HCL 4 MG/2ML IJ SOLN
4.0000 mg | Freq: Once | INTRAMUSCULAR | Status: AC
  Administered 2022-11-24: 4 mg via INTRAVENOUS
  Filled 2022-11-24: qty 2

## 2022-11-24 MED ORDER — FAMOTIDINE IN NACL 20-0.9 MG/50ML-% IV SOLN
20.0000 mg | INTRAVENOUS | Status: AC
  Administered 2022-11-24: 20 mg via INTRAVENOUS
  Filled 2022-11-24: qty 50

## 2022-11-24 NOTE — MAU Note (Signed)
Pt reports nausea has subsided and headache is down to 4 on 0-10 scale. Requested apple juice-Pepcid infusing with remaining LR

## 2022-11-24 NOTE — MAU Note (Signed)
Pt vomited x 2- green foamy emesis. Pt is requesting Pepcid for reflux burning pain in her throat

## 2022-11-24 NOTE — MAU Note (Signed)
.  Jody Robinson is a 32 y.o. at [redacted]w[redacted]d here in MAU reporting: for the last 3 days c/o headache and N/V with 5 episodes of emesis today. Pt reports taking Tylenol 325mg  this am and Reglan today, nothing was effective. Pt reports last meal was breakfast and able to drink apple juice and water. Pt denies VB or LOF. No known exposure to possible infection.  Pt was Tachycardic in Triage 120s Onset of complaint: 3 days  Pain score: 6/10 Vitals:   11/24/22 2036  BP: 108/74  Pulse: (!) 121  Resp: 18  Temp: 98.3 F (36.8 C)  SpO2: 97%     FHT:165 Lab orders placed from triage:  UA

## 2022-11-24 NOTE — MAU Provider Note (Signed)
History     CSN: [id number]  Arrival date and time: 11/24/22 2016   Event Date/Time   First Provider Initiated Contact with Patient 11/24/22 2055      Chief Complaint  Patient presents with   Nausea   Emesis   Headache   HPI  Jody Robinson is a 32 y.o. [id number] at [redacted]w[redacted]d who presents for evaluation of a headache. Patient reports she has a history of migraines and has had this one for the last 3 days. Patient rates the pain as a 10/10 and has not tried anything for the pain. She reports the pain in her head is making her vomit. She reports she sees a neurologist for the migraines when she is not pregnant. She reports the HA cocktail helps when she is pregnant. She denies any vaginal bleeding, discharge, and leaking of fluid. Denies any constipation, diarrhea or any urinary complaints. Reports normal fetal movement.   OB History     Gravida  4   Para  2   Term  2   Preterm      AB  1   Living  2      SAB  1   IAB      Ectopic      Multiple  0   Live Births  2           Past Medical History:  Diagnosis Date   Abnormal Pap smear of cervix    Acute prerenal failure due to hyperemesis and pyelnonephritis(HCC) 10/03/2022   Anemia in pregnancy 03/01/2020   S/p IV feraheme 6/18 and 02/04/2020  CBC Latest Ref Rng & Units 02/06/2020 01/19/2020 12/21/2019 WBC 4.0 - 10.5 K/uL 17.3(H) 13.8(H) 14.9(H) Hemoglobin 12.0 - 15.0 g/dL 6.9(G) 8.3(L) 8.5(L) Hematocrit 36 - 46 % 32.7(L) 26.9(L) 26.7(L) Platelets 150 - 400 K/uL 483(H) 472(H) 559(H)     Depression    Gallbladder sludge 08/22/2019   Herpes simplex 01/16/2013   History of abnormal cervical Pap smear 10/09/2017   2020: pap smear wnl   History of COVID-19 09/10/2019   COVID+ 09/08/19 symptomatic    History of IUFD 02/06/2019   Less than 20 weeks   Migraine without aura and without status migrainosus, not intractable 05/10/2022   Pancreatitis, acute 08/20/2019   Seasonal allergies 10/09/2017   Thrombocytosis  08/16/2019   CBC  Latest Ref Rng & Units  02/06/2020  01/19/2020  12/21/2019  WBC  4.0 - 10.5 K/uL  17.3(H)  13.8(H)  14.9(H)  Hemoglobin  12.0 - 15.0 g/dL  2.9(B)  8.3(L)  8.5(L)  Hematocrit  36 - 46 %  32.7(L)  26.9(L)  26.7(L)  Platelets  150 - 400 K/uL  483(H)  472(H)  559(H)         Tourette's syndrome    Trichomoniasis     Past Surgical History:  Procedure Laterality Date   COLPOSCOPY      Family History  Problem Relation Age of Onset   Diabetes Mother     Social History   Tobacco Use   Smoking status: Never   Smokeless tobacco: Never  Vaping Use   Vaping Use: Never used  Substance Use Topics   Alcohol use: Not Currently    Comment: occasional   Drug use: Not Currently    Allergies: No Known Allergies  No medications prior to admission.    Review of Systems  Constitutional: Negative.  Negative for fatigue and fever.  HENT: Negative.    Respiratory: Negative.  Negative for  shortness of breath.   Cardiovascular: Negative.  Negative for chest pain.  Gastrointestinal:  Positive for nausea and vomiting. Negative for abdominal pain, constipation and diarrhea.  Genitourinary: Negative.  Negative for dysuria, vaginal bleeding and vaginal discharge.  Neurological:  Positive for headaches. Negative for dizziness.   Physical Exam   Blood pressure (!) 97/57, pulse 77, temperature 98.3 F (36.8 C), temperature source Oral, resp. rate 18, height 5' (1.524 m), weight 64.8 kg, last menstrual period 07/22/2022, SpO2 100 %.  Patient Vitals for the past 24 hrs:  BP Temp Temp src Pulse Resp SpO2 Height Weight  11/24/22 2308 (!) 97/57 -- -- 77 18 100 % -- --  11/24/22 2036 108/74 98.3 F (36.8 C) Oral (!) 121 18 97 % 5' (1.524 m) 64.8 kg    Physical Exam Vitals and nursing note reviewed.  Constitutional:      General: She is not in acute distress.    Appearance: She is well-developed.  HENT:     Head: Normocephalic.  Eyes:     Pupils: Pupils are equal, round, and reactive  to light.  Cardiovascular:     Rate and Rhythm: Normal rate and regular rhythm.     Heart sounds: Normal heart sounds.  Pulmonary:     Effort: Pulmonary effort is normal. No respiratory distress.     Breath sounds: Normal breath sounds.  Abdominal:     General: Bowel sounds are normal. There is no distension.     Palpations: Abdomen is soft.     Tenderness: There is no abdominal tenderness.  Skin:    General: Skin is warm and dry.  Neurological:     Mental Status: She is alert and oriented to person, place, and time.  Psychiatric:        Mood and Affect: Mood normal.        Behavior: Behavior normal.        Thought Content: Thought content normal.        Judgment: Judgment normal.     FHT: 164 bpm   MAU Course  Procedures  Results for orders placed or performed during the hospital encounter of 11/24/22 (from the past 24 hour(s))  Urinalysis, Routine w reflex microscopic -Urine, Clean Catch     Status: Abnormal   Collection Time: 11/24/22  8:43 PM  Result Value Ref Range   Color, Urine AMBER (A) YELLOW   APPearance CLOUDY (A) CLEAR   Specific Gravity, Urine 1.023 1.005 - 1.030   pH 7.0 5.0 - 8.0   Glucose, UA NEGATIVE NEGATIVE mg/dL   Hgb urine dipstick NEGATIVE NEGATIVE   Bilirubin Urine SMALL (A) NEGATIVE   Ketones, ur 5 (A) NEGATIVE mg/dL   Protein, ur 161 (A) NEGATIVE mg/dL   Nitrite NEGATIVE NEGATIVE   Leukocytes,Ua MODERATE (A) NEGATIVE   RBC / HPF 6-10 0 - 5 RBC/hpf   WBC, UA 6-10 0 - 5 WBC/hpf   Bacteria, UA FEW (A) NONE SEEN   Squamous Epithelial / HPF 21-50 0 - 5 /HPF   Mucus PRESENT    Non Squamous Epithelial 0-5 (A) NONE SEEN      MDM Labs ordered and reviewed.   UA, UC LR bolus Zofran Reglan Benedryl  Patient reports resolution of HA  Assessment and Plan   1. Pregnancy headache in second trimester   2. [redacted] weeks gestation of pregnancy   3. Nausea/vomiting in pregnancy     -Discharge home in stable condition -Second trimester  precautions discussed -Patient advised to  follow-up with OB as scheduled for prenatal care -Patient may return to MAU as needed or if her condition were to change or worsen  Rolm Bookbinder, CNM 11/25/2022, 8:55 PM

## 2022-11-24 NOTE — Discharge Instructions (Signed)

## 2022-11-25 DIAGNOSIS — O99210 Obesity complicating pregnancy, unspecified trimester: Secondary | ICD-10-CM | POA: Insufficient documentation

## 2022-11-26 LAB — CULTURE, OB URINE

## 2022-11-27 ENCOUNTER — Inpatient Hospital Stay (HOSPITAL_COMMUNITY)
Admission: AD | Admit: 2022-11-27 | Discharge: 2022-11-27 | Disposition: A | Discharge status: Home / Self Care | Payer: 59 | Attending: Obstetrics and Gynecology | Admitting: Obstetrics and Gynecology

## 2022-11-27 ENCOUNTER — Encounter (HOSPITAL_COMMUNITY): Payer: Self-pay | Admitting: Obstetrics and Gynecology

## 2022-11-27 DIAGNOSIS — Y939 Activity, unspecified: Secondary | ICD-10-CM | POA: Diagnosis not present

## 2022-11-27 DIAGNOSIS — W1830XA Fall on same level, unspecified, initial encounter: Secondary | ICD-10-CM | POA: Insufficient documentation

## 2022-11-27 DIAGNOSIS — O26892 Other specified pregnancy related conditions, second trimester: Secondary | ICD-10-CM | POA: Diagnosis not present

## 2022-11-27 DIAGNOSIS — S60512A Abrasion of left hand, initial encounter: Secondary | ICD-10-CM | POA: Diagnosis not present

## 2022-11-27 DIAGNOSIS — O09292 Supervision of pregnancy with other poor reproductive or obstetric history, second trimester: Secondary | ICD-10-CM | POA: Insufficient documentation

## 2022-11-27 DIAGNOSIS — O98512 Other viral diseases complicating pregnancy, second trimester: Secondary | ICD-10-CM | POA: Insufficient documentation

## 2022-11-27 DIAGNOSIS — O99342 Other mental disorders complicating pregnancy, second trimester: Secondary | ICD-10-CM | POA: Insufficient documentation

## 2022-11-27 DIAGNOSIS — Z3A18 18 weeks gestation of pregnancy: Secondary | ICD-10-CM

## 2022-11-27 DIAGNOSIS — W19XXXA Unspecified fall, initial encounter: Secondary | ICD-10-CM | POA: Diagnosis not present

## 2022-11-27 DIAGNOSIS — S40812A Abrasion of left upper arm, initial encounter: Secondary | ICD-10-CM

## 2022-11-27 DIAGNOSIS — S60511A Abrasion of right hand, initial encounter: Secondary | ICD-10-CM

## 2022-11-27 DIAGNOSIS — O9A212 Injury, poisoning and certain other consequences of external causes complicating pregnancy, second trimester: Secondary | ICD-10-CM | POA: Insufficient documentation

## 2022-11-27 LAB — URINALYSIS, ROUTINE W REFLEX MICROSCOPIC
Bilirubin Urine: NEGATIVE
Glucose, UA: NEGATIVE mg/dL
Hgb urine dipstick: NEGATIVE
Ketones, ur: NEGATIVE mg/dL
Nitrite: NEGATIVE
Protein, ur: NEGATIVE mg/dL
Specific Gravity, Urine: 1.005 (ref 1.005–1.030)
pH: 7 (ref 5.0–8.0)

## 2022-11-27 MED ORDER — MUPIROCIN 2 % EX OINT
TOPICAL_OINTMENT | Freq: Two times a day (BID) | CUTANEOUS | Status: DC
  Filled 2022-11-27: qty 22
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
## 2022-11-28 ENCOUNTER — Non-Acute Institutional Stay (HOSPITAL_COMMUNITY)
Admission: RE | Admit: 2022-11-28 | Discharge: 2022-11-28 | Disposition: A | Discharge status: Home / Self Care | Payer: 59 | Source: Ambulatory Visit | Attending: Internal Medicine | Admitting: Internal Medicine

## 2022-11-28 DIAGNOSIS — O211 Hyperemesis gravidarum with metabolic disturbance: Secondary | ICD-10-CM | POA: Diagnosis not present

## 2022-11-28 MED ORDER — SODIUM CHLORIDE 0.9 % IV SOLN
8.0000 mg | Freq: Once | INTRAVENOUS | Status: AC
  Administered 2022-11-28: 8 mg via INTRAVENOUS
  Filled 2022-11-28: qty 4

## 2022-11-28 MED ORDER — SODIUM CHLORIDE 0.9 % IV SOLN: 8.0000 mg | Freq: Once | INTRAVENOUS | Status: DC

## 2022-11-28 MED ORDER — PANTOPRAZOLE SODIUM 40 MG IV SOLR
40.0000 mg | Freq: Once | INTRAVENOUS | Status: AC
  Administered 2022-11-28: 40 mg via INTRAVENOUS
  Filled 2022-11-28: qty 10

## 2022-11-28 MED ORDER — SODIUM CHLORIDE 0.9 % IV SOLN
12.5000 mg | Freq: Once | INTRAVENOUS | Status: AC
  Administered 2022-11-28: 12.5 mg via INTRAVENOUS
  Filled 2022-11-28: qty 12.5

## 2022-11-28 MED ORDER — LACTATED RINGERS IV BOLUS
1000.0000 mL | Freq: Once | INTRAVENOUS | Status: AC
  Administered 2022-11-28: 1000 mL via INTRAVENOUS

## 2022-11-28 MED ORDER — SODIUM CHLORIDE 0.9 % IV SOLN: INTRAVENOUS | Status: DC | PRN

## 2022-11-28 NOTE — Progress Notes (Signed)
PATIENT CARE CENTER NOTE  Diagnosis: Hyperemesis gravidarum with electrolyte imbalance [O21.1]        Provider: Lyndel Safe MD  Procedure: Fluid Bolus and anti-emetics (#14 of 30)  Note: Patient received 1 Litre Lactated Ringers fluid bolus over 1 hour, IVPB Phenergan 12.5 mg, and IV Protonix 40 mg via PIV. Pt tolerated infusion and medications well with no adverse reaction. Vital signs stable. Pt can come twice a week for a total of 30 doses. Pt advised to schedule next appointment at front desk. AVS offered, but pt declined. Pt is alert, oriented, and ambulatory at discharge.

## 2022-12-01 ENCOUNTER — Encounter (HOSPITAL_COMMUNITY): Payer: Self-pay | Admitting: Obstetrics & Gynecology

## 2022-12-01 ENCOUNTER — Inpatient Hospital Stay (HOSPITAL_COMMUNITY): Admit: 2022-12-01 | Payer: 59

## 2022-12-01 ENCOUNTER — Inpatient Hospital Stay (HOSPITAL_BASED_OUTPATIENT_CLINIC_OR_DEPARTMENT_OTHER): Payer: 59

## 2022-12-01 ENCOUNTER — Other Ambulatory Visit: Payer: Self-pay

## 2022-12-01 ENCOUNTER — Inpatient Hospital Stay (HOSPITAL_COMMUNITY)
Admission: AD | Admit: 2022-12-01 | Discharge: 2022-12-01 | Disposition: A | Discharge status: Home / Self Care | Payer: 59 | Attending: Obstetrics & Gynecology | Admitting: Obstetrics & Gynecology

## 2022-12-01 DIAGNOSIS — R109 Unspecified abdominal pain: Secondary | ICD-10-CM

## 2022-12-01 DIAGNOSIS — O0990 Supervision of high risk pregnancy, unspecified, unspecified trimester: Secondary | ICD-10-CM

## 2022-12-01 DIAGNOSIS — Z3A18 18 weeks gestation of pregnancy: Secondary | ICD-10-CM

## 2022-12-01 DIAGNOSIS — R1030 Lower abdominal pain, unspecified: Secondary | ICD-10-CM | POA: Insufficient documentation

## 2022-12-01 DIAGNOSIS — O26852 Spotting complicating pregnancy, second trimester: Secondary | ICD-10-CM

## 2022-12-01 DIAGNOSIS — O209 Hemorrhage in early pregnancy, unspecified: Secondary | ICD-10-CM | POA: Insufficient documentation

## 2022-12-01 DIAGNOSIS — B9689 Other specified bacterial agents as the cause of diseases classified elsewhere: Secondary | ICD-10-CM | POA: Diagnosis not present

## 2022-12-01 DIAGNOSIS — O26892 Other specified pregnancy related conditions, second trimester: Secondary | ICD-10-CM | POA: Diagnosis not present

## 2022-12-01 DIAGNOSIS — N760 Acute vaginitis: Secondary | ICD-10-CM

## 2022-12-01 DIAGNOSIS — O09292 Supervision of pregnancy with other poor reproductive or obstetric history, second trimester: Secondary | ICD-10-CM | POA: Insufficient documentation

## 2022-12-01 DIAGNOSIS — O23592 Infection of other part of genital tract in pregnancy, second trimester: Secondary | ICD-10-CM | POA: Diagnosis not present

## 2022-12-01 LAB — URINALYSIS, ROUTINE W REFLEX MICROSCOPIC
Bilirubin Urine: NEGATIVE
Glucose, UA: NEGATIVE mg/dL
Ketones, ur: 5 mg/dL — AB
Nitrite: NEGATIVE
Protein, ur: 30 mg/dL — AB
Specific Gravity, Urine: 1.024 (ref 1.005–1.030)
pH: 6 (ref 5.0–8.0)

## 2022-12-01 LAB — WET PREP, GENITAL
Sperm: NONE SEEN
Trich, Wet Prep: NONE SEEN
WBC, Wet Prep HPF POC: >=10 — AB (ref <10)
Yeast Wet Prep HPF POC: NONE SEEN

## 2022-12-01 MED ORDER — METRONIDAZOLE 0.75 % VA GEL: 1.0000 | Freq: Every day | VAGINAL | 1 refills | Status: DC

## 2022-12-01 NOTE — MAU Provider Note (Signed)
Chief Complaint: Vaginal Bleeding   Event Date/Time   First Provider Initiated Contact with Patient 12/01/22 1800     SUBJECTIVE HPI: Jody Robinson is a 32 y.o. [id number] at [redacted]w[redacted]d who presents to Maternity Admissions reporting spotting and low abd cramping. Feels like she is dehydrated.. Vomited twice in the past 24 hours and many times the day before. No N/V now. Tolerating PO's well. Get s IV infusions for Hyperemesis.   Location: low abd Quality: cramping, poking sensation Severity: 5/10 on pain scale Duration: <24 hours Context: [redacted] weeks gestation, placentation not yet verified Timing: intermittent Modifying factors: None. Hasn't tried anything Associated signs and symptoms: Pos for spotting, N/V (improving). Neg for fever, chills, diarrhea, constipation, urinary complaints.  Past Medical History:  Diagnosis Date   Abnormal Pap smear of cervix    Acute prerenal failure due to hyperemesis and pyelnonephritis(HCC) 10/03/2022   Anemia in pregnancy 03/01/2020   S/p IV feraheme 6/18 and 02/04/2020  CBC Latest Ref Rng & Units 02/06/2020 01/19/2020 12/21/2019 WBC 4.0 - 10.5 K/uL 17.3(H) 13.8(H) 14.9(H) Hemoglobin 12.0 - 15.0 g/dL 4.5(W) 8.3(L) 8.5(L) Hematocrit 36 - 46 % 32.7(L) 26.9(L) 26.7(L) Platelets 150 - 400 K/uL 483(H) 472(H) 559(H)     Depression    Gallbladder sludge 08/22/2019   Herpes simplex 01/16/2013   History of abnormal cervical Pap smear 10/09/2017   2020: pap smear wnl   History of COVID-19 09/10/2019   COVID+ 09/08/19 symptomatic    History of IUFD 02/06/2019   Less than 20 weeks   Migraine without aura and without status migrainosus, not intractable 05/10/2022   Pancreatitis, acute 08/20/2019   Seasonal allergies 10/09/2017   Thrombocytosis 08/16/2019   CBC  Latest Ref Rng & Units  02/06/2020  01/19/2020  12/21/2019  WBC  4.0 - 10.5 K/uL  17.3(H)  13.8(H)  14.9(H)  Hemoglobin  12.0 - 15.0 g/dL  0.9(W)  8.3(L)  8.5(L)  Hematocrit  36 - 46 %  32.7(L)  26.9(L)  26.7(L)   Platelets  150 - 400 K/uL  483(H)  472(H)  559(H)         Tourette's syndrome    Trichomoniasis    OB History  Gravida Para Term Preterm AB Living  SAB IAB Ectopic Multiple Live Births  1     0 2    # Outcome Date GA Lbr Len/2nd Weight Sex Delivery Anes PTL Lv  4 Current           3 Term 03/01/20 [redacted]w[redacted]d 03:23 / 00:15 3810 g F Vag-Spont EPI  LIV     Birth Comments: WDL   2 SAB 01/2019 [redacted]w[redacted]d         1 Term 03/08/13 [redacted]w[redacted]d  2495 g  Vag-Spont EPI  LIV     Birth Comments: no complications   Past Surgical History:  Procedure Laterality Date   COLPOSCOPY     Social History   Socioeconomic History   Marital status: Single    Spouse name: Not on file   Number of children: Not on file   Years of education: Not on file   Highest education level: Not on file  Occupational History   Not on file  Tobacco Use   Smoking status: Never   Smokeless tobacco: Never  Vaping Use   Vaping Use: Never used  Substance and Sexual Activity   Alcohol use: Not Currently    Comment: occasional   Drug use: Not Currently  Sexual activity: Yes    Birth control/protection: None  Other Topics Concern   Not on file  Social History Narrative   Not on file   Social Determinants of Health   Financial Resource Strain: Not on file  Food Insecurity: No Food Insecurity (07/17/2021)   Hunger Vital Sign    Worried About Running Out of Food in the Last Year: Never true    Ran Out of Food in the Last Year: Never true  Transportation Needs: No Transportation Needs (07/17/2021)   PRAPARE - Administrator, Civil Service (Medical): No    Lack of Transportation (Non-Medical): No  Physical Activity: Not on file  Stress: Not on file  Social Connections: Not on file  Intimate Partner Violence: Not on file   Family History  Problem Relation Age of Onset   Diabetes Mother    No current facility-administered medications on file prior to encounter.   Current Outpatient Medications on  File Prior to Encounter  Medication Sig Dispense Refill   metoCLOPramide (REGLAN) 5 MG tablet Take 5 mg by mouth 4 (four) times daily.     ondansetron (ZOFRAN-ODT) 4 MG disintegrating tablet Take 1 tablet (4 mg total) by mouth every 6 (six) hours as needed for nausea or vomiting. 30 tablet 3   promethazine (PHENERGAN) 25 MG tablet Take 1 tablet (25 mg total) by mouth every 6 (six) hours as needed for nausea, vomiting or refractory nausea / vomiting. 30 tablet 2   acetaminophen (TYLENOL) 325 MG tablet Take 650 mg by mouth every 6 (six) hours as needed for mild pain or headache.     Blood Pressure Monitoring DEVI 1 each by Does not apply route once a week. 1 each 0   famotidine (PEPCID) 20 MG tablet Take 1 tablet (20 mg total) by mouth 2 (two) times daily. 60 tablet 2   prenatal vitamin w/FE, FA (PRENATAL 1 + 1) 27-1 MG TABS tablet Take 1 tablet by mouth daily at 12 noon. 30 tablet 11   scopolamine (TRANSDERM-SCOP) 1 MG/3DAYS Place 1 patch (1.5 mg total) onto the skin every 3 (three) days. 10 patch 12   No Known Allergies  I have reviewed patient's Past Medical Hx, Surgical Hx, Family Hx, Social Hx, medications and allergies.   Review of Systems  OBJECTIVE Patient Vitals for the past 24 hrs:  BP Temp Temp src Pulse Resp SpO2 Height Weight  12/01/22 1735 (!) 96/57 -- -- 89 -- -- -- --  12/01/22 1558 104/62 98.1 F (36.7 C) Oral 92 19 97 % -- --  12/01/22 1552 -- -- -- -- -- -- 5' (1.524 m) 67.9 kg   Constitutional: Well-developed, well-nourished female in no acute distress.  Cardiovascular: normal rate Respiratory: normal rate and effort.  GI: Abd soft, non-tender, gravid appropriate for gestational age.  MS: Extremities nontender, no edema, normal ROM Neurologic: Alert and oriented x 4.  GU: Neg CVAT.  SPECULUM EXAM: NEFG, small amount of thin, white, malodorous discharge, scant tan blood noted, cervix clean, visually closed    LAB RESULTS Results for orders placed or performed  during the hospital encounter of 12/01/22 (from the past 24 hour(s))  Urinalysis, Routine w reflex microscopic -Urine, Clean Catch     Status: Abnormal   Collection Time: 12/01/22  3:23 PM  Result Value Ref Range   Color, Urine AMBER (A) YELLOW   APPearance HAZY (A) CLEAR   Specific Gravity, Urine 1.024 1.005 - 1.030   pH 6.0 5.0 -  8.0   Glucose, UA NEGATIVE NEGATIVE mg/dL   Hgb urine dipstick MODERATE (A) NEGATIVE   Bilirubin Urine NEGATIVE NEGATIVE   Ketones, ur 5 (A) NEGATIVE mg/dL   Protein, ur 30 (A) NEGATIVE mg/dL   Nitrite NEGATIVE NEGATIVE   Leukocytes,Ua LARGE (A) NEGATIVE   RBC / HPF 6-10 0 - 5 RBC/hpf   WBC, UA 6-10 0 - 5 WBC/hpf   Bacteria, UA RARE (A) NONE SEEN   Squamous Epithelial / HPF 11-20 0 - 5 /HPF   Mucus PRESENT   Wet prep, genital     Status: Abnormal   Collection Time: 12/01/22  6:00 PM   Specimen: Vaginal  Result Value Ref Range   Yeast Wet Prep HPF POC NONE SEEN NONE SEEN   Trich, Wet Prep NONE SEEN NONE SEEN   Clue Cells Wet Prep HPF POC PRESENT (A) NONE SEEN   WBC, Wet Prep HPF POC >=10 (A) <10   Sperm NONE SEEN     IMAGING CL 3.6 cm. No previa or abruption seen. Nml FHR  MAU COURSE Orders Placed This Encounter  Procedures   Wet prep, genital   Culture, OB Urine   [name] MFM OB Limited   US OB Transvaginal   Urinalysis, Routine w reflex microscopic -Urine, Clean Catch   Discharge patient   Meds ordered this encounter  Medications   metroNIDAZOLE (METROGEL) 0.75 % vaginal gel    Sig: Place 1 Applicatorful vaginally at bedtime. Apply one applicatorful to vagina at bedtime for 5 days    Dispense:  70 g    Refill:  1    Order Specific Question:   Supervising Provider    Answer:   Adam Phenix [[id number]]    MDM - Mild low abd pain w/ closed cervix per [name] and spec exam. Wet prep and exam C/W BV. Will Tx w/ Metrogel due to HEG. UA + Hgb and Leuks but also Squamous epithelials. Appears contaminated. No urinary complaints. Sent for culture. Ts if  indicated.   - Scant spotting possibly 2/2 BV. No evidence of previa, PTL, abruption.   ASSESSMENT 1. Supervision of high risk pregnancy, antepartum   2. Spotting complicating pregnancy, second trimester   3. Abdominal pain during pregnancy in second trimester   4. BV (bacterial vaginosis)     PLAN Discharge home in stable condition. Bleeding precautions Pelvic rest x 1 week Cultures pending  Follow-up Information     [facility] Follow up.   Specialty: Obstetrics and Gynecology Why: As needed in emergencies Contact information: [address] [id number] Wilhemina Bonito [place] [address] [phone]        [facility] Follow up on 12/02/2022.   Specialty: Obstetrics and Gynecology Why: As scheduled Contact information: [address] [place] [address] [phone]               Allergies as of 12/01/2022   No Known Allergies      Medication List     STOP taking these medications    scopolamine 1 MG/3DAYS Commonly known as: TRANSDERM-SCOP       TAKE these medications    acetaminophen 325 MG tablet Commonly known as: TYLENOL Take 650 mg by mouth every 6 (six) hours as needed for mild pain or headache.   Blood Pressure Monitoring Devi 1 each by Does not apply route once a week.   famotidine 20 MG tablet Commonly known as: Pepcid Take  1 tablet (20 mg total) by mouth 2 (two) times daily.   metoCLOPramide 5 MG tablet Commonly known as: REGLAN Take 5 mg by mouth 4 (four) times daily.   metroNIDAZOLE 0.75 % vaginal gel Commonly known as: METROGEL Place 1 Applicatorful vaginally at bedtime. Apply one applicatorful to vagina at bedtime for 5 days   ondansetron 4 MG disintegrating tablet Commonly known as: ZOFRAN-ODT Take 1 tablet (4 mg total) by mouth every 6 (six) hours as needed for nausea or vomiting.   prenatal vitamin w/FE, FA 27-1 MG  Tabs tablet Take 1 tablet by mouth daily at 12 noon.   promethazine 25 MG tablet Commonly known as: PHENERGAN Take 1 tablet (25 mg total) by mouth every 6 (six) hours as needed for nausea, vomiting or refractory nausea / vomiting.         Katrinka Blazing, [place], [place] 12/01/2022  8:14 PM

## 2022-12-01 NOTE — MAU Note (Signed)
Jody Robinson is a 32 y.o. at [redacted]w[redacted]d here in MAU reporting: she's having dark red spotting with wiping.  Denies recent intercourse.  Reports also having a little pain in lower abdomen, states feels like poking.  States she also thinks she may be dehydrated, vomited twice today and "all day yesterday". LMP: NA Onset of complaint: today Pain score: 5 Vitals:   12/01/22 1558  BP: 104/62  Pulse: 92  Resp: 19  Temp: 98.1 F (36.7 C)  SpO2: 97%     FHT:163 bpm Lab orders placed from triage:   UA

## 2022-12-01 NOTE — Discharge Instructions (Signed)

## 2022-12-02 ENCOUNTER — Encounter: Payer: Self-pay | Admitting: *Deleted

## 2022-12-02 ENCOUNTER — Other Ambulatory Visit: Payer: Self-pay | Admitting: Obstetrics and Gynecology

## 2022-12-02 ENCOUNTER — Ambulatory Visit: Payer: 59 | Attending: Maternal & Fetal Medicine

## 2022-12-02 ENCOUNTER — Other Ambulatory Visit: Payer: Self-pay | Admitting: *Deleted

## 2022-12-02 ENCOUNTER — Encounter (HOSPITAL_COMMUNITY): Payer: 59

## 2022-12-02 ENCOUNTER — Ambulatory Visit: Payer: 59 | Admitting: *Deleted

## 2022-12-02 ENCOUNTER — Ambulatory Visit (HOSPITAL_BASED_OUTPATIENT_CLINIC_OR_DEPARTMENT_OTHER): Payer: 59 | Admitting: Maternal & Fetal Medicine

## 2022-12-02 VITALS — BP 112/65 | HR 90

## 2022-12-02 DIAGNOSIS — O99212 Obesity complicating pregnancy, second trimester: Secondary | ICD-10-CM | POA: Diagnosis present

## 2022-12-02 DIAGNOSIS — O99112 Other diseases of the blood and blood-forming organs and certain disorders involving the immune mechanism complicating pregnancy, second trimester: Secondary | ICD-10-CM

## 2022-12-02 DIAGNOSIS — O09292 Supervision of pregnancy with other poor reproductive or obstetric history, second trimester: Secondary | ICD-10-CM

## 2022-12-02 DIAGNOSIS — Z3A19 19 weeks gestation of pregnancy: Secondary | ICD-10-CM

## 2022-12-02 DIAGNOSIS — O99012 Anemia complicating pregnancy, second trimester: Secondary | ICD-10-CM

## 2022-12-02 DIAGNOSIS — E0590 Thyrotoxicosis, unspecified without thyrotoxic crisis or storm: Secondary | ICD-10-CM

## 2022-12-02 DIAGNOSIS — Z362 Encounter for other antenatal screening follow-up: Secondary | ICD-10-CM

## 2022-12-02 DIAGNOSIS — Z8759 Personal history of other complications of pregnancy, childbirth and the puerperium: Secondary | ICD-10-CM | POA: Insufficient documentation

## 2022-12-02 DIAGNOSIS — D75839 Thrombocytosis, unspecified: Secondary | ICD-10-CM | POA: Insufficient documentation

## 2022-12-02 DIAGNOSIS — O0990 Supervision of high risk pregnancy, unspecified, unspecified trimester: Secondary | ICD-10-CM | POA: Insufficient documentation

## 2022-12-02 DIAGNOSIS — Z87448 Personal history of other diseases of urinary system: Secondary | ICD-10-CM

## 2022-12-02 DIAGNOSIS — O99282 Endocrine, nutritional and metabolic diseases complicating pregnancy, second trimester: Secondary | ICD-10-CM | POA: Insufficient documentation

## 2022-12-02 DIAGNOSIS — O35EXX0 Maternal care for other (suspected) fetal abnormality and damage, fetal genitourinary anomalies, not applicable or unspecified: Secondary | ICD-10-CM | POA: Diagnosis present

## 2022-12-02 DIAGNOSIS — D649 Anemia, unspecified: Secondary | ICD-10-CM

## 2022-12-02 DIAGNOSIS — O0992 Supervision of high risk pregnancy, unspecified, second trimester: Secondary | ICD-10-CM

## 2022-12-02 HISTORY — DX: Personal history of other diseases of urinary system: Z87.448

## 2022-12-02 LAB — GC/CHLAMYDIA PROBE AMP (~~LOC~~) NOT AT ARMC
Chlamydia: NEGATIVE
Comment: NEGATIVE
Comment: NORMAL
Neisseria Gonorrhea: NEGATIVE

## 2022-12-02 LAB — CULTURE, OB URINE

## 2022-12-02 MED ORDER — ASPIRIN 81 MG PO TBEC: 81.0000 mg | DELAYED_RELEASE_TABLET | Freq: Every day | ORAL | 12 refills | Status: DC

## 2022-12-02 NOTE — Progress Notes (Addendum)
Patient information  Patient Name: Jody Robinson  Patient MRN:   [id number]  Referring practice: MFM Referring Provider: [facility])  MFM CONSULT  Jody Robinson is a 32 y.o. [phone] at [redacted]w[redacted]d here for ultrasound and consultation.   RE thrombocytosis: As far back as 2019 I can see her platelet count was 640,000.  I do not see an assessment from hematology and the patient reports that she has never been told she needs treatment and does not recall a specific workup.  Consequently she is also had severe anemia and was getting iron infusions per her report. Her most recent hemoglobin was 10.8 and her ferritin was in the normal range (>30 in pregnancy).  I recommend that she have referral to hematology for complete workup and assessment of this condition.  RE history of 15 week IUFD: The patient presented for hyperemesis and was found to have a 15-week IUFD as well as an AKI with elevated liver enzymes.  Per review of the chart it is unclear at regarding the exact etiology of her abnormal labs but is likely due to dehydration in the setting of pregnancy.  At the time of discharge her labs were trending towards normal.  In the preceding 1 to 2 months prior to pregnancy her liver enzymes were slightly elevated but are now back to normal.  RE hyperthyroidism?:  As far back as 3 years prior the patient has had biochemical evidence of hyperthyroidism.  The free T4 is just slightly above the normal range while the TSH is less than 0.5.  She has never received a diagnosis to her knowledge of hyperthyroidism and I do not see any notes from endocrinology in the chart.  She had a thyrotropin receptor antibody that was negative on 08/21/2019.  On a few occasions it was presumed the low TSH was due to pregnancy from the suppression of the hCG which does happen but the free T4 was also elevated which is not normal in pregnancy.  I recommend she see an endocrinologist to establish a  diagnosis and proper follow-up during pregnancy.  In general the goal is to keep the TSH for in the upper normal range or slightly above the normal range. If this is the case then on treatment is needed during pregnancy.  Ordering a complete set of thyroid antibodies may be helpful to establish the diagnosis and risk of transmission to the fetus or newborn..  It is not uncommon during pregnancy where the labs will tend to normalize due to the increased in thyroid binding globulin during pregnancy.  She will need to have serial labs a referral to an endocrinologist.  RE history of renal insufficiency: Patient has a history of AKI that was present at least since 2020 when she was admitted for dehydration, nausea vomiting and intrauterine fetal demise at 15 weeks.  During this pregnancy she was admitted around [redacted] weeks gestation for prerenal acute kidney injury with admission Cr of 2.6.  Patient was admitted and started on multiple antiemetics, IV hydration, potassium repletion. Also started on Rocephin for her infection.  She improved over time, and her diet was advanced as tolerated.  Her WBC improved from 21 to 7.9 on day of discharge; K went from 2.7 to 3.4, Cr 2.6  to 0.75.  She was noted to be tolerating regular diet and oral medications by day of discharge.  Her urine culture came back with mixed flora but she was continued on oral Duricef to  complete the course of therapy.  There were no other concerns.  She was deemed stable for discharge to home with outpatient follow up.   RE fetal urinary tract dilation: I discussed the presence of mild right-sided urinary tract dilation.  Will continue to follow with ultrasound.  If the finding is persistent or enlarging we will formally discussed the clinical implications and arrange postdelivery consultation with pediatric urology.  Sonographic findings Single intrauterine pregnancy. Fetal cardiac activity:  Observed and appears normal. Presentation:  Cephalic. The anatomic structures that were well seen appear normal except for mild right-sided urinary tract dilation. Due to poor acoustic windows, the visualization some structures remain suboptimally seen. Fetal biometry shows normal growth. Amniotic fluid volume: Within normal limits. MVP: 4.83 cm. Placenta: Posterior.  Assessment 1. [redacted] weeks gestation of pregnancy 2. Thrombocytosis 3. History of 15wk loss (2020 4. History of renal insufficiency 5. Supervision of high risk pregnancy, antepartum 6. Hyperthyroidism affecting pregnancy  7. Fetal urinary tract dilation, right-sided, mild Plan -Detailed ultrasound was done today without abnormalities  -Baseline preeclampsia labs: CMP, CBC and urine protein/creatinine ratio if not previously completed.  -TSH and T4 should be checked every 4-6 weeks -Antiphospholipid ab labs to be ordered by the referring provider due to her 15 week loss.  If assistance is needed please refer back to MFM. -The patient has multiple medical conditions that require monitoring Please make the following referrals: Hematology (thrombocytosis) and endocrinology (hyperthyroidism). If her creatinine becomes elevated over 0.8 she should also see a nephrologist.  -Start Aspirin 81 mg for preeclampsia prophylasis -Follow-up anatomy and fetal growth in 4 to 6 weeks -Serial growth ultrasounds starting around 28 weeks to monitor for fetal growth restriction -Antenatal testing to start around 32 weeks due to the increased risk of stillbirth and high risk pregnancy -Delivery timing pending clinical course but likely around 39 weeks gestion -Continue routine prenatal care with referring OB provider  Review of Systems: A review of systems was performed and was negative except per HPI   Vitals and Physical Exam    12/02/2022    9:30 AM 12/01/2022    8:21 PM 12/01/2022    5:35 PM  Vitals with BMI  Systolic 112 99 96  Diastolic 65 53 57  Pulse 90 88 89  Sitting  comfortably on the sonogram table Nonlabored breathing Normal rate and rhythm Abdomen is nontender  Past pregnancies OB History  Gravida Para Term Preterm AB Living  SAB IAB Ectopic Multiple Live Births  1     0 2    # Outcome Date GA Lbr Len/2nd Weight Sex Delivery Anes PTL Lv  4 Current           3 Term 03/01/20 [redacted]w[redacted]d 03:23 / 00:15 8 lb 6.4 oz (3.81 kg) F Vag-Spont EPI  LIV     Birth Comments: WDL   2 SAB 01/2019 [redacted]w[redacted]d         1 Term 03/08/13 [redacted]w[redacted]d  5 lb 8 oz (2.495 kg)  Vag-Spont EPI  LIV     Birth Comments: no complications     I spent 60 minutes reviewing the patients chart, including labs and images as well as counseling the patient about her medical conditions. Greater than 50% of the time was spent in direct face-to-face patient counseling.  Jody Robinson  MFM, [place]   12/02/2022  11:00 AM

## 2022-12-03 NOTE — MAU Provider Note (Incomplete)
Chief Complaint: Vaginal Bleeding   Event Date/Time   First Provider Initiated Contact with Patient 12/01/22 1800     SUBJECTIVE HPI: Jody Robinson is a 32 y.o. [id number] at [redacted]w[redacted]d who presents to Maternity Admissions reporting spotting and low abd cramping. Feels like she is dehydrated.. Vomited twice in the past 24 hours and many times the day before. No N/V now. Tolerating PO's well. Get s IV infusions for Hyperemesis.   Location: low abd Quality: cramping Severity: ***/10 on pain scale Duration: *** Context: *** Timing: *** Modifying factors: *** Associated signs and symptoms: ***  Past Medical History:  Diagnosis Date  . Abnormal Pap smear of cervix   . Acute prerenal failure due to hyperemesis and pyelnonephritis(HCC) 10/03/2022  . Anemia in pregnancy 03/01/2020   S/p IV feraheme 6/18 and 02/04/2020  CBC Latest Ref Rng & Units 02/06/2020 01/19/2020 12/21/2019 WBC 4.0 - 10.5 K/uL 17.3(H) 13.8(H) 14.9(H) Hemoglobin 12.0 - 15.0 g/dL 4.5(W) 8.3(L) 8.5(L) Hematocrit 36 - 46 % 32.7(L) 26.9(L) 26.7(L) Platelets 150 - 400 K/uL 483(H) 472(H) 559(H)    . Depression   . Gallbladder sludge 08/22/2019  . Herpes simplex 01/16/2013  . History of abnormal cervical Pap smear 10/09/2017   2020: pap smear wnl  . History of COVID-19 09/10/2019   COVID+ 09/08/19 symptomatic   . History of IUFD 02/06/2019   Less than 20 weeks  . Migraine without aura and without status migrainosus, not intractable 05/10/2022  . Pancreatitis, acute 08/20/2019  . Seasonal allergies 10/09/2017  . Thrombocytosis 08/16/2019   CBC  Latest Ref Rng & Units  02/06/2020  01/19/2020  12/21/2019  WBC  4.0 - 10.5 K/uL  17.3(H)  13.8(H)  14.9(H)  Hemoglobin  12.0 - 15.0 g/dL  0.9(W)  8.3(L)  8.5(L)  Hematocrit  36 - 46 %  32.7(L)  26.9(L)  26.7(L)  Platelets  150 - 400 K/uL  483(H)  472(H)  559(H)        . Tourette's syndrome   . Trichomoniasis    OB History  Gravida Para Term Preterm AB Living  4 2 2   1 2   SAB IAB Ectopic  Multiple Live Births  1     0 2    # Outcome Date GA Lbr Len/2nd Weight Sex Delivery Anes PTL Lv  4 Current           3 Term 03/01/20 [redacted]w[redacted]d 03:23 / 00:15 3810 g F Vag-Spont EPI  LIV     Birth Comments: WDL   2 SAB 01/2019 [redacted]w[redacted]d         1 Term 03/08/13 [redacted]w[redacted]d  2495 g  Vag-Spont EPI  LIV     Birth Comments: no complications   Past Surgical History:  Procedure Laterality Date  . COLPOSCOPY     Social History   Socioeconomic History  . Marital status: Single    Spouse name: Not on file  . Number of children: Not on file  . Years of education: Not on file  . Highest education level: Not on file  Occupational History  . Not on file  Tobacco Use  . Smoking status: Never  . Smokeless tobacco: Never  Vaping Use  . Vaping Use: Never used  Substance and Sexual Activity  . Alcohol use: Not Currently    Comment: occasional  . Drug use: Not Currently  . Sexual activity: Yes    Birth control/protection: None  Other Topics Concern  . Not on file  Social History Narrative  .  Not on file   Social Determinants of Health   Financial Resource Strain: Not on file  Food Insecurity: No Food Insecurity (07/17/2021)   Hunger Vital Sign   . Worried About Programme researcher, broadcasting/film/video in the Last Year: Never true   . Ran Out of Food in the Last Year: Never true  Transportation Needs: No Transportation Needs (07/17/2021)   PRAPARE - Transportation   . Lack of Transportation (Medical): No   . Lack of Transportation (Non-Medical): No  Physical Activity: Not on file  Stress: Not on file  Social Connections: Not on file  Intimate Partner Violence: Not on file   Family History  Problem Relation Age of Onset  . Diabetes Mother    No current facility-administered medications on file prior to encounter.   Current Outpatient Medications on File Prior to Encounter  Medication Sig Dispense Refill  . metoCLOPramide (REGLAN) 5 MG tablet Take 5 mg by mouth 4 (four) times daily.    . ondansetron  (ZOFRAN-ODT) 4 MG disintegrating tablet Take 1 tablet (4 mg total) by mouth every 6 (six) hours as needed for nausea or vomiting. 30 tablet 3  . promethazine (PHENERGAN) 25 MG tablet Take 1 tablet (25 mg total) by mouth every 6 (six) hours as needed for nausea, vomiting or refractory nausea / vomiting. 30 tablet 2  . acetaminophen (TYLENOL) 325 MG tablet Take 650 mg by mouth every 6 (six) hours as needed for mild pain or headache.    . Blood Pressure Monitoring DEVI 1 each by Does not apply route once a week. 1 each 0  . famotidine (PEPCID) 20 MG tablet Take 1 tablet (20 mg total) by mouth 2 (two) times daily. 60 tablet 2  . prenatal vitamin w/FE, FA (PRENATAL 1 + 1) 27-1 MG TABS tablet Take 1 tablet by mouth daily at 12 noon. 30 tablet 11  . scopolamine (TRANSDERM-SCOP) 1 MG/3DAYS Place 1 patch (1.5 mg total) onto the skin every 3 (three) days. 10 patch 12   No Known Allergies  I have reviewed patient's Past Medical Hx, Surgical Hx, Family Hx, Social Hx, medications and allergies.   Review of Systems  OBJECTIVE Patient Vitals for the past 24 hrs:  BP Temp Temp src Pulse Resp SpO2 Height Weight  12/01/22 1735 (!) 96/57 -- -- 89 -- -- -- --  12/01/22 1558 104/62 98.1 F (36.7 C) Oral 92 19 97 % -- --  12/01/22 1552 -- -- -- -- -- -- 5' (1.524 m) 67.9 kg   Constitutional: Well-developed, well-nourished female in no acute distress.  Cardiovascular: normal rate Respiratory: normal rate and effort.  GI: Abd soft, non-tender, gravid appropriate for gestational age. Pos BS x 4 MS: Extremities nontender, no edema, normal ROM Neurologic: Alert and oriented x 4.  GU: Neg CVAT.  SPECULUM EXAM: NEFG, physiologic discharge, no blood noted, cervix clean  BIMANUAL: cervix ***; uterus normal size, no adnexal tenderness or masses.  No CMT.  LAB RESULTS Results for orders placed or performed during the hospital encounter of 12/01/22 (from the past 24 hour(s))  Urinalysis, Routine w reflex microscopic  -Urine, Clean Catch     Status: Abnormal   Collection Time: 12/01/22  3:23 PM  Result Value Ref Range   Color, Urine AMBER (A) YELLOW   APPearance HAZY (A) CLEAR   Specific Gravity, Urine 1.024 1.005 - 1.030   pH 6.0 5.0 - 8.0   Glucose, UA NEGATIVE NEGATIVE mg/dL   Hgb urine dipstick MODERATE (A) NEGATIVE  Bilirubin Urine NEGATIVE NEGATIVE   Ketones, ur 5 (A) NEGATIVE mg/dL   Protein, ur 30 (A) NEGATIVE mg/dL   Nitrite NEGATIVE NEGATIVE   Leukocytes,Ua LARGE (A) NEGATIVE   RBC / HPF 6-10 0 - 5 RBC/hpf   WBC, UA 6-10 0 - 5 WBC/hpf   Bacteria, UA RARE (A) NONE SEEN   Squamous Epithelial / HPF 11-20 0 - 5 /HPF   Mucus PRESENT   Wet prep, genital     Status: Abnormal   Collection Time: 12/01/22  6:00 PM   Specimen: Vaginal  Result Value Ref Range   Yeast Wet Prep HPF POC NONE SEEN NONE SEEN   Trich, Wet Prep NONE SEEN NONE SEEN   Clue Cells Wet Prep HPF POC PRESENT (A) NONE SEEN   WBC, Wet Prep HPF POC >=10 (A) <10   Sperm NONE SEEN     IMAGING No results found.  MAU COURSE Orders Placed This Encounter  Procedures  . Wet prep, genital  . Culture, OB Urine  . [name] MFM OB Limited  . US OB Transvaginal  . Urinalysis, Routine w reflex microscopic -Urine, Clean Catch  . Discharge patient   Meds ordered this encounter  Medications  . metroNIDAZOLE (METROGEL) 0.75 % vaginal gel    Sig: Place 1 Applicatorful vaginally at bedtime. Apply one applicatorful to vagina at bedtime for 5 days    Dispense:  70 g    Refill:  1    Order Specific Question:   Supervising Provider    Answer:   Adam Phenix [[id number]]    MDM  ASSESSMENT 1. Supervision of high risk pregnancy, antepartum   2. Spotting complicating pregnancy, second trimester   3. Abdominal pain during pregnancy in second trimester   4. BV (bacterial vaginosis)     PLAN Discharge home in stable condition. *** precautions  Follow-up Information     [facility] Follow up.   Specialty:  Obstetrics and Gynecology Why: As needed in emergencies Contact information: [address] [id number] Wilhe[place] Bon[place] [address] [phone]        [facility] Follow up on 12/02/2022.   Specialty: Obstetrics and Gynecology Why: As scheduled Contact information: [address] [place] [address] [phone]               Allergies as of 12/01/2022   No Known Allergies      Medication List     STOP taking these medications    scopolamine 1 MG/3DAYS Commonly known as: TRANSDERM-SCOP       TAKE these medications    acetaminophen 325 MG tablet Commonly known as: TYLENOL Take 650 mg by mouth every 6 (six) hours as needed for mild pain or headache.   Blood Pressure Monitoring Devi 1 each by Does not apply route once a week.   famotidine 20 MG tablet Commonly known as: Pepcid Take 1 tablet (20 mg total) by mouth 2 (two) times daily.   metoCLOPramide 5 MG tablet Commonly known as: REGLAN Take 5 mg by mouth 4 (four) times daily.   metroNIDAZOLE 0.75 % vaginal gel Commonly known as: METROGEL Place 1 Applicatorful vaginally at bedtime. Apply one applicatorful to vagina at bedtime for 5 days   ondansetron 4 MG disintegrating tablet Commonly known as: ZOFRAN-ODT Take 1 tablet (4 mg total) by mouth every 6 (six) hours as needed for nausea or vomiting.   prenatal vitamin w/FE, FA 27-1  MG Tabs tablet Take 1 tablet by mouth daily at 12 noon.   promethazine 25 MG tablet Commonly known as: PHENERGAN Take 1 tablet (25 mg total) by mouth every 6 (six) hours as needed for nausea, vomiting or refractory nausea / vomiting.         Katrinka Blazing, [place], [place] 12/01/2022  8:14 PM

## 2022-12-04 ENCOUNTER — Telehealth: Payer: Self-pay

## 2022-12-04 DIAGNOSIS — E0590 Thyrotoxicosis, unspecified without thyrotoxic crisis or storm: Secondary | ICD-10-CM

## 2022-12-04 DIAGNOSIS — Z3A19 19 weeks gestation of pregnancy: Secondary | ICD-10-CM

## 2022-12-04 NOTE — Telephone Encounter (Addendum)
-----   Message from [place] Bing, MD sent at 12/02/2022  9:04 PM EDT ----- Regarding: needs repeat labs and urgent heme & endocrine referral She saw mfm today and they recommended the above things. I've put in future labs for her to come by and get drawn as soon as possible.  Can you also please call the heme and endocrine offices to get her in for an urgent consult; I've placed referrals in epic. Thanks   Pt has been notified about Heme referral.  Need to call to f/u on Endocrinology.  Jody Robinson

## 2022-12-05 ENCOUNTER — Non-Acute Institutional Stay (HOSPITAL_COMMUNITY)
Admission: RE | Admit: 2022-12-05 | Discharge: 2022-12-05 | Disposition: A | Discharge status: Home / Self Care | Payer: 59 | Source: Ambulatory Visit | Attending: Internal Medicine | Admitting: Internal Medicine

## 2022-12-05 ENCOUNTER — Encounter: Payer: Self-pay | Admitting: Obstetrics and Gynecology

## 2022-12-05 DIAGNOSIS — Z3A00 Weeks of gestation of pregnancy not specified: Secondary | ICD-10-CM | POA: Diagnosis not present

## 2022-12-05 DIAGNOSIS — O211 Hyperemesis gravidarum with metabolic disturbance: Secondary | ICD-10-CM | POA: Diagnosis present

## 2022-12-05 MED ORDER — SODIUM CHLORIDE 0.9 % IV SOLN
12.5000 mg | Freq: Once | INTRAVENOUS | Status: AC
  Administered 2022-12-05: 12.5 mg via INTRAVENOUS
  Filled 2022-12-05: qty 12.5

## 2022-12-05 MED ORDER — LACTATED RINGERS IV BOLUS
1000.0000 mL | Freq: Once | INTRAVENOUS | Status: AC
  Administered 2022-12-05: 1000 mL via INTRAVENOUS

## 2022-12-05 MED ORDER — PANTOPRAZOLE SODIUM 40 MG IV SOLR
40.0000 mg | Freq: Once | INTRAVENOUS | Status: AC
  Administered 2022-12-05: 40 mg via INTRAVENOUS
  Filled 2022-12-05: qty 10

## 2022-12-05 MED ORDER — SODIUM CHLORIDE 0.9 % IV SOLN
8.0000 mg | Freq: Once | INTRAVENOUS | Status: AC
  Administered 2022-12-05: 8 mg via INTRAVENOUS
  Filled 2022-12-05: qty 4

## 2022-12-05 MED ORDER — SODIUM CHLORIDE 0.9 % IV SOLN: INTRAVENOUS | Status: DC | PRN

## 2022-12-05 NOTE — Progress Notes (Signed)
PATIENT CARE CENTER NOTE  Diagnosis: Hyperemesis gravidarum with electrolyte imbalance [O21.1]        Provider: Lyndel Safe MD  Procedure: Fluid bolus and anti-emetics (dose #15 of 30)  Note: Patient received 1 Liter Lactated Ringers fluid bolus  over 1 hour, IVPB Phenergan 12.5 mg, IVBP Zofran 8 mg, and IV Protonix 40 mg via PIV. Patient tolerated infusion and medications well with no adverse reaction. Vital signs stable. Patient can come twice a week for a total of 30 doses. AVS offered but patient declined. Patient is alert, oriented, and ambulatory at discharge.

## 2022-12-05 NOTE — Telephone Encounter (Signed)
Call placed to [facility] Endocrinology- made call for urgent referral per Dr Vergie Living . Stated that [facility] will send referral for review by providers and then will call patient for appt.  Judeth Cornfield, RNC

## 2022-12-05 NOTE — Addendum Note (Signed)
Addended by: Isabell Jarvis on: 12/05/2022 11:15 AM   Modules accepted: Orders

## 2022-12-09 ENCOUNTER — Non-Acute Institutional Stay (HOSPITAL_COMMUNITY)
Admission: RE | Admit: 2022-12-09 | Discharge: 2022-12-09 | Disposition: A | Discharge status: Home / Self Care | Payer: 59 | Source: Ambulatory Visit | Attending: Internal Medicine | Admitting: Internal Medicine

## 2022-12-09 DIAGNOSIS — Z3A00 Weeks of gestation of pregnancy not specified: Secondary | ICD-10-CM | POA: Diagnosis not present

## 2022-12-09 DIAGNOSIS — O211 Hyperemesis gravidarum with metabolic disturbance: Secondary | ICD-10-CM | POA: Insufficient documentation

## 2022-12-09 MED ORDER — SODIUM CHLORIDE 0.9 % IV SOLN
12.5000 mg | Freq: Once | INTRAVENOUS | Status: AC
  Administered 2022-12-09: 12.5 mg via INTRAVENOUS
  Filled 2022-12-09: qty 12.5

## 2022-12-09 MED ORDER — SODIUM CHLORIDE 0.9 % IV SOLN
8.0000 mg | Freq: Once | INTRAVENOUS | Status: DC
  Filled 2022-12-09: qty 4

## 2022-12-09 MED ORDER — ONDANSETRON 8 MG/NS 50 ML IVPB
8.0000 mg | Freq: Once | INTRAVENOUS | Status: AC
  Administered 2022-12-09: 8 mg via INTRAVENOUS
  Filled 2022-12-09: qty 8

## 2022-12-09 MED ORDER — SODIUM CHLORIDE 0.9 % IV SOLN: INTRAVENOUS | Status: DC | PRN

## 2022-12-09 MED ORDER — LACTATED RINGERS IV BOLUS
1000.0000 mL | Freq: Once | INTRAVENOUS | Status: AC
  Administered 2022-12-09: 1000 mL via INTRAVENOUS

## 2022-12-09 MED ORDER — PANTOPRAZOLE SODIUM 40 MG IV SOLR
40.0000 mg | Freq: Once | INTRAVENOUS | Status: AC
  Administered 2022-12-09: 40 mg via INTRAVENOUS
  Filled 2022-12-09: qty 10

## 2022-12-09 NOTE — Progress Notes (Signed)
PATIENT CARE CENTER NOTE  Diagnosis: Hyperemesis gravidarum with electrolyte imbalance [O21.1]      Provider: Lyndel Safe MD  Procedure: Fluid bolus and anti-emetics (dose #16 of 30)   Note: Patient received 1 Litre Lactated Ringers fluid bolus over 1 hour, IVPB Phenergan 12.5 mg, IVPB Zofran 8 mg, and IV Protonix 40 mg via PIV. Pt tolerated infusion and medications well with no adverse reaction. Vital signs stable. Pt can come twice a week for a total of 30 doses. Pt advised to schedule next appointment at front desk.

## 2022-12-10 ENCOUNTER — Telehealth: Payer: Self-pay

## 2022-12-10 DIAGNOSIS — O99282 Endocrine, nutritional and metabolic diseases complicating pregnancy, second trimester: Secondary | ICD-10-CM

## 2022-12-10 NOTE — Telephone Encounter (Signed)
Orders Placed This Encounter  Procedures   TSH    Standing Status:   Future    Standing Expiration Date:   12/10/2023   T3, free    Standing Status:   Future    Standing Expiration Date:   12/10/2023   T4, free    Standing Status:   Future    Standing Expiration Date:   12/10/2023   Thyroid stimulating immunoglobulin    Standing Status:   Future    Standing Expiration Date:   12/10/2023   Orders placed.   Eliezer Champagne

## 2022-12-11 ENCOUNTER — Other Ambulatory Visit (INDEPENDENT_AMBULATORY_CARE_PROVIDER_SITE_OTHER): Payer: 59

## 2022-12-11 DIAGNOSIS — O99282 Endocrine, nutritional and metabolic diseases complicating pregnancy, second trimester: Secondary | ICD-10-CM | POA: Diagnosis not present

## 2022-12-11 DIAGNOSIS — Z3A21 21 weeks gestation of pregnancy: Secondary | ICD-10-CM | POA: Diagnosis not present

## 2022-12-11 DIAGNOSIS — E0590 Thyrotoxicosis, unspecified without thyrotoxic crisis or storm: Secondary | ICD-10-CM | POA: Diagnosis not present

## 2022-12-11 LAB — EXTRA SPECIMEN

## 2022-12-11 LAB — TSH: TSH: 0.6 u[IU]/mL (ref 0.35–5.50)

## 2022-12-11 LAB — T4, FREE: Free T4: 0.72 ng/dL (ref 0.60–1.60)

## 2022-12-11 LAB — T3, FREE: T3, Free: 3.4 pg/mL (ref 2.3–4.2)

## 2022-12-12 ENCOUNTER — Non-Acute Institutional Stay (HOSPITAL_COMMUNITY)
Admission: RE | Admit: 2022-12-12 | Discharge: 2022-12-12 | Disposition: A | Discharge status: Home / Self Care | Payer: 59 | Source: Ambulatory Visit | Attending: Internal Medicine | Admitting: Internal Medicine

## 2022-12-12 DIAGNOSIS — O211 Hyperemesis gravidarum with metabolic disturbance: Secondary | ICD-10-CM | POA: Insufficient documentation

## 2022-12-12 MED ORDER — SODIUM CHLORIDE 0.9 % IV SOLN
12.5000 mg | Freq: Four times a day (QID) | INTRAVENOUS | Status: DC | PRN
  Administered 2022-12-12: 12.5 mg via INTRAVENOUS
  Filled 2022-12-12: qty 12.5

## 2022-12-12 MED ORDER — ONDANSETRON 8 MG/50ML IVPB (CHCC)
8.0000 mg | Freq: Once | INTRAVENOUS | Status: DC
  Filled 2022-12-12: qty 8

## 2022-12-12 MED ORDER — LACTATED RINGERS IV BOLUS
1000.0000 mL | Freq: Once | INTRAVENOUS | Status: AC
  Administered 2022-12-12: 1000 mL via INTRAVENOUS

## 2022-12-12 MED ORDER — SODIUM CHLORIDE 0.9 % IV SOLN
8.0000 mg | Freq: Once | INTRAVENOUS | Status: AC
  Administered 2022-12-12: 8 mg via INTRAVENOUS
  Filled 2022-12-12: qty 54

## 2022-12-12 MED ORDER — SODIUM CHLORIDE 0.9 % IV SOLN: INTRAVENOUS | Status: DC | PRN

## 2022-12-12 MED ORDER — PANTOPRAZOLE SODIUM 40 MG IV SOLR
40.0000 mg | Freq: Once | INTRAVENOUS | Status: AC
  Administered 2022-12-12: 40 mg via INTRAVENOUS
  Filled 2022-12-12: qty 10

## 2022-12-12 MED ORDER — SODIUM CHLORIDE 0.9 % IV SOLN
8.0000 mg | Freq: Once | INTRAVENOUS | Status: DC
  Filled 2022-12-12: qty 4

## 2022-12-12 NOTE — Progress Notes (Signed)
PATIENT CARE CENTER NOTE  Diagnosis: Hyperemesis gravidarum with electrolyte imbalance [O21.1]      Provider: Lyndel Safe MD   Procedure: Fluid bolus and anti-emetics (dose #17 of 30)  Note: Patient received 1 Litre Lactated Ringers fluid bolus over 1 hor, IVPB Phenergan 12.5 mg, IVPB Zofran 8 mg, and IV Protonix 40 mg via PIV. Pt tolerated infusion and medications well with no adverse reaction. Vital signs stable. Pt can come twice a week for a total of 30 doses. Pt advised to schedule next appointment at front desk. Pt is alert, oriented, and ambulatory at discharge.

## 2022-12-13 ENCOUNTER — Inpatient Hospital Stay (HOSPITAL_COMMUNITY)
Admission: AD | Admit: 2022-12-13 | Discharge: 2022-12-13 | Disposition: A | Discharge status: Home / Self Care | Payer: 59 | Attending: Obstetrics & Gynecology | Admitting: Obstetrics & Gynecology

## 2022-12-13 DIAGNOSIS — Z79899 Other long term (current) drug therapy: Secondary | ICD-10-CM | POA: Insufficient documentation

## 2022-12-13 DIAGNOSIS — Z8616 Personal history of COVID-19: Secondary | ICD-10-CM | POA: Diagnosis not present

## 2022-12-13 DIAGNOSIS — R319 Hematuria, unspecified: Secondary | ICD-10-CM

## 2022-12-13 DIAGNOSIS — O09292 Supervision of pregnancy with other poor reproductive or obstetric history, second trimester: Secondary | ICD-10-CM | POA: Diagnosis not present

## 2022-12-13 DIAGNOSIS — O209 Hemorrhage in early pregnancy, unspecified: Secondary | ICD-10-CM | POA: Diagnosis not present

## 2022-12-13 DIAGNOSIS — O99342 Other mental disorders complicating pregnancy, second trimester: Secondary | ICD-10-CM | POA: Insufficient documentation

## 2022-12-13 DIAGNOSIS — O26892 Other specified pregnancy related conditions, second trimester: Secondary | ICD-10-CM | POA: Diagnosis not present

## 2022-12-13 DIAGNOSIS — Z8719 Personal history of other diseases of the digestive system: Secondary | ICD-10-CM | POA: Diagnosis not present

## 2022-12-13 DIAGNOSIS — O99891 Other specified diseases and conditions complicating pregnancy: Secondary | ICD-10-CM | POA: Insufficient documentation

## 2022-12-13 DIAGNOSIS — O210 Mild hyperemesis gravidarum: Secondary | ICD-10-CM | POA: Diagnosis present

## 2022-12-13 DIAGNOSIS — O98512 Other viral diseases complicating pregnancy, second trimester: Secondary | ICD-10-CM | POA: Insufficient documentation

## 2022-12-13 DIAGNOSIS — Z3A20 20 weeks gestation of pregnancy: Secondary | ICD-10-CM | POA: Diagnosis not present

## 2022-12-13 DIAGNOSIS — R519 Headache, unspecified: Secondary | ICD-10-CM | POA: Insufficient documentation

## 2022-12-13 LAB — URINALYSIS, ROUTINE W REFLEX MICROSCOPIC
Bilirubin Urine: NEGATIVE
Glucose, UA: NEGATIVE mg/dL
Ketones, ur: NEGATIVE mg/dL
Nitrite: NEGATIVE
Protein, ur: NEGATIVE mg/dL
Specific Gravity, Urine: 1.015 (ref 1.005–1.030)
pH: 7 (ref 5.0–8.0)

## 2022-12-13 LAB — WET PREP, GENITAL
Clue Cells Wet Prep HPF POC: NONE SEEN
Sperm: NONE SEEN
Trich, Wet Prep: NONE SEEN
WBC, Wet Prep HPF POC: <10 (ref <10)
Yeast Wet Prep HPF POC: NONE SEEN

## 2022-12-13 MED ORDER — ACETAMINOPHEN-CAFFEINE 500-65 MG PO TABS
2.0000 | ORAL_TABLET | Freq: Once | ORAL | Status: AC
  Administered 2022-12-13: 2 via ORAL
  Filled 2022-12-13: qty 2

## 2022-12-13 MED ORDER — EXCEDRIN TENSION HEADACHE 500-65 MG PO TABS: 1.0000 | ORAL_TABLET | Freq: Four times a day (QID) | ORAL | 0 refills | Status: DC | PRN

## 2022-12-13 NOTE — MAU Provider Note (Signed)
History     CSN: [id number]  Arrival date and time: 12/13/22 1234   Event Date/Time   First Provider Initiated Contact with Patient 12/13/22 1614      Chief Complaint  Patient presents with   Vaginal Bleeding   Vaginal Bleeding Pertinent negatives include no abdominal pain, back pain, chills, diarrhea, dysuria, fever, flank pain, nausea, rash, sore throat or vomiting.   Patient is 32 y.o. [id number] [redacted]w[redacted]d here with complaints of blood when she wiped. She went to the bathroom at ~10 AM and urinated and had a BM. Denies pushing hard or constipation. She noticed drops of blood on the toilet paper and in the toilet. She ate a milkshake and fries at about 11 and then came straight to the MAU to check on her pregnancy. She has been feeling FM that whole time. She also reports she has not had continued bleeding since that one time event.    She also endorsed a 6-7/10 headache. She has not taken anything for it. When asked what she typically uses from headaches she responded I come her.   This is that patient's 6th visit to the MAU this pregnancy.   +FM, denies LOF, VB, contractions, vaginal discharge.  OB History     Gravida  4   Para  2   Term  2   Preterm      AB  1   Living  2      SAB  1   IAB      Ectopic      Multiple  0   Live Births  2           Past Medical History:  Diagnosis Date   Abnormal Pap smear of cervix    Acute prerenal failure due to hyperemesis and pyelnonephritis(HCC) 10/03/2022   Anemia in pregnancy 03/01/2020   S/p IV feraheme 6/18 and 02/04/2020  CBC Latest Ref Rng & Units 02/06/2020 01/19/2020 12/21/2019 WBC 4.0 - 10.5 K/uL 17.3(H) 13.8(H) 14.9(H) Hemoglobin 12.0 - 15.0 g/dL 1.4(N) 8.3(L) 8.5(L) Hematocrit 36 - 46 % 32.7(L) 26.9(L) 26.7(L) Platelets 150 - 400 K/uL 483(H) 472(H) 559(H)     Depression    Elevated liver enzymes 10/13/2017   [ ]  rpt cmp with 28wk labs   Gallbladder sludge 08/22/2019   Herpes simplex 01/16/2013   History of  abnormal cervical Pap smear 10/09/2017   2020: pap smear wnl   History of COVID-19 09/10/2019   COVID+ 09/08/19 symptomatic    History of IUFD 02/06/2019   Less than 20 weeks   Migraine without aura and without status migrainosus, not intractable 05/10/2022   Pancreatitis, acute 08/20/2019   Seasonal allergies 10/09/2017   Thrombocytosis 08/16/2019   CBC  Latest Ref Rng & Units  02/06/2020  01/19/2020  12/21/2019  WBC  4.0 - 10.5 K/uL  17.3(H)  13.8(H)  14.9(H)  Hemoglobin  12.0 - 15.0 g/dL  8.2(N)  8.3(L)  8.5(L)  Hematocrit  36 - 46 %  32.7(L)  26.9(L)  26.7(L)  Platelets  150 - 400 K/uL  483(H)  472(H)  559(H)         Tourette's syndrome    Trichomoniasis     Past Surgical History:  Procedure Laterality Date   COLPOSCOPY      Family History  Problem Relation Age of Onset   Diabetes Mother     Social History   Tobacco Use   Smoking status: Never   Smokeless tobacco: Never  Vaping Use  Vaping Use: Never used  Substance Use Topics   Alcohol use: Not Currently    Comment: occasional   Drug use: Not Currently    Allergies: No Known Allergies  Medications Prior to Admission  Medication Sig Dispense Refill Last Dose   aspirin EC 81 MG tablet Take 1 tablet (81 mg total) by mouth daily. Swallow whole. 30 tablet 12 12/12/2022   prenatal vitamin w/FE, FA (PRENATAL 1 + 1) 27-1 MG TABS tablet Take 1 tablet by mouth daily at 12 noon. 30 tablet 11 12/12/2022   acetaminophen (TYLENOL) 325 MG tablet Take 650 mg by mouth every 6 (six) hours as needed for mild pain or headache. (Patient not taking: Reported on 12/02/2022)      Blood Pressure Monitoring DEVI 1 each by Does not apply route once a week. 1 each 0    famotidine (PEPCID) 20 MG tablet Take 1 tablet (20 mg total) by mouth 2 (two) times daily. 60 tablet 2    metoCLOPramide (REGLAN) 5 MG tablet Take 5 mg by mouth 4 (four) times daily. (Patient not taking: Reported on 12/02/2022)      metroNIDAZOLE (METROGEL) 0.75 % vaginal gel Place 1  Applicatorful vaginally at bedtime. Apply one applicatorful to vagina at bedtime for 5 days 70 g 1    ondansetron (ZOFRAN-ODT) 4 MG disintegrating tablet Take 1 tablet (4 mg total) by mouth every 6 (six) hours as needed for nausea or vomiting. (Patient not taking: Reported on 12/02/2022) 30 tablet 3    promethazine (PHENERGAN) 25 MG tablet Take 1 tablet (25 mg total) by mouth every 6 (six) hours as needed for nausea, vomiting or refractory nausea / vomiting. 30 tablet 2     Review of Systems  Constitutional:  Negative for chills and fever.  HENT:  Negative for congestion and sore throat.   Eyes:  Negative for pain and visual disturbance.  Respiratory:  Negative for cough, chest tightness and shortness of breath.   Cardiovascular:  Negative for chest pain.  Gastrointestinal:  Negative for abdominal pain, diarrhea, nausea and vomiting.  Endocrine: Negative for cold intolerance and heat intolerance.  Genitourinary:  Positive for vaginal bleeding. Negative for dysuria and flank pain.  Musculoskeletal:  Negative for back pain.  Skin:  Negative for rash.  Allergic/Immunologic: Negative for food allergies.  Neurological:  Negative for dizziness and light-headedness.  Psychiatric/Behavioral:  Negative for agitation.    Physical Exam   Blood pressure 105/65, pulse 89, temperature 98.2 F (36.8 C), temperature source Oral, resp. rate 17, height 5' (1.524 m), weight 68.7 kg, last menstrual period 07/22/2022, SpO2 98 %.  Physical Exam Vitals and nursing note reviewed.  Constitutional:      General: She is not in acute distress.    Appearance: She is well-developed.     Comments: Pregnant female  HENT:     Head: Normocephalic and atraumatic.  Eyes:     General: No scleral icterus.    Conjunctiva/sclera: Conjunctivae normal.  Cardiovascular:     Rate and Rhythm: Normal rate.  Pulmonary:     Effort: Pulmonary effort is normal.  Chest:     Chest wall: No tenderness.  Abdominal:      Palpations: Abdomen is soft.     Tenderness: There is no abdominal tenderness. There is no guarding or rebound.     Comments: Gravid  Genitourinary:    Vagina: Normal.  Musculoskeletal:        General: Normal range of motion.     Cervical  back: Normal range of motion and neck supple.  Skin:    General: Skin is warm and dry.     Findings: No rash.  Neurological:     Mental Status: She is alert and oriented to person, place, and time.     MAU Course  Procedures  MDM: low  This patient presents to the ED for concern of   Chief Complaint  Patient presents with   Vaginal Bleeding     These complains involves an extensive number of treatment options, and is a complaint that carries with it a high risk of complications and morbidity.  The differential diagnosis for  1. Bleeding INCLUDES vaginal bleeding that is pregnancy related, urinary bleeding possibly from UTI, rectal bleeding most commonly from hemorrhoids  Co morbidities that complicate the patient evaluation: Hyperemesis Gravidarum, prior 15 week loss, hyperthyroidism, prior AKI  External records from outside source obtained and reviewed including Prenatal care records  Lab Tests: UA, Urine Culture, Wet prep, and Gonorrhea/Chlamydia   I ordered, and personally interpreted labs.  The pertinent results include:   Results for orders placed or performed during the hospital encounter of 12/13/22 (from the past 24 hour(s))  Urinalysis, Routine w reflex microscopic -Urine, Clean Catch     Status: Abnormal   Collection Time: 12/13/22  1:59 PM  Result Value Ref Range   Color, Urine YELLOW YELLOW   APPearance HAZY (A) CLEAR   Specific Gravity, Urine 1.015 1.005 - 1.030   pH 7.0 5.0 - 8.0   Glucose, UA NEGATIVE NEGATIVE mg/dL   Hgb urine dipstick SMALL (A) NEGATIVE   Bilirubin Urine NEGATIVE NEGATIVE   Ketones, ur NEGATIVE NEGATIVE mg/dL   Protein, ur NEGATIVE NEGATIVE mg/dL   Nitrite NEGATIVE NEGATIVE   Leukocytes,Ua TRACE  (A) NEGATIVE   RBC / HPF 0-5 0 - 5 RBC/hpf   WBC, UA 0-5 0 - 5 WBC/hpf   Bacteria, UA RARE (A) NONE SEEN   Squamous Epithelial / HPF 6-10 0 - 5 /HPF   Mucus PRESENT   Wet prep, genital     Status: None   Collection Time: 12/13/22  4:15 PM   Specimen: Vaginal  Result Value Ref Range   Yeast Wet Prep HPF POC NONE SEEN NONE SEEN   Trich, Wet Prep NONE SEEN NONE SEEN   Clue Cells Wet Prep HPF POC NONE SEEN NONE SEEN   WBC, Wet Prep HPF POC <10 <10   Sperm NONE SEEN      Medicines ordered and prescription drug management:  Medications: Excedrin tension   Reevaluation of the patient after these medicines showed that the patient improved I have reviewed the patients home medicines and have made adjustments as needed  MAU Course: HA improved after treatment with excedrin  After the interventions noted above, I reevaluated the patient and found that they have :improved  Dispostion: discharged   Assessment and Plan   1. History of rectal bleeding   2. [redacted] weeks gestation of pregnancy   3. Pregnancy headache in second trimester   4. Hematuria, unspecified type    - No bleeding seen on exam - HA treated effectively with excedrin tension, given RX on discharge - Continued routine prenatal care  Future Appointments  Date Time Provider Department Center  12/16/2022 11:15 AM Warden Fillers, MD [facility]  12/17/2022  9:20 AM Altamese Misenheimer, MD [facility] None  12/18/2022 11:00 AM [facility] RM 2 [facility] None  12/25/2022  1:40 PM Jaci Standard, MD [facility]  None  12/25/2022  2:30 PM [facility] LAB [facility] None  01/08/2023 10:45 AM [facility] NURSE [facility]  01/08/2023 11:00 AM [facility] US1 [facility]    Isa Rankin Kamron Vanwyhe 12/13/2022, 4:16 PM

## 2022-12-13 NOTE — MAU Note (Addendum)
Jody Robinson is a 32 y.o. at [redacted]w[redacted]d here in MAU reporting: when she went to the bathroom, she saw blood when she wiped and a little in the toilet. Reports pain at times in LLQ No LOF.  Denies recent intercourse, believes she is feeling some movement. Reports intermittent HA.  Onset of complaint: this morning Pain score: 7 Vitals:   12/13/22 1328 12/13/22 1846  BP: 105/65 110/69  Pulse: 89 74  Resp: 17   Temp: 98.2 F (36.8 C)   SpO2: 98%      FHT:156 Lab orders placed from triage:   Urine obtained.   "Come in for everything, after the miscarriage, I just get worried and want to make sure everything is ok"

## 2022-12-16 ENCOUNTER — Other Ambulatory Visit: Payer: Self-pay

## 2022-12-16 ENCOUNTER — Ambulatory Visit (INDEPENDENT_AMBULATORY_CARE_PROVIDER_SITE_OTHER): Payer: 59 | Admitting: Obstetrics and Gynecology

## 2022-12-16 ENCOUNTER — Encounter: Payer: Self-pay | Admitting: Obstetrics and Gynecology

## 2022-12-16 VITALS — BP 110/79 | HR 90 | Wt 146.1 lb

## 2022-12-16 DIAGNOSIS — Z3A21 21 weeks gestation of pregnancy: Secondary | ICD-10-CM

## 2022-12-16 DIAGNOSIS — F952 Tourette's disorder: Secondary | ICD-10-CM

## 2022-12-16 DIAGNOSIS — Z8619 Personal history of other infectious and parasitic diseases: Secondary | ICD-10-CM

## 2022-12-16 DIAGNOSIS — O0990 Supervision of high risk pregnancy, unspecified, unspecified trimester: Secondary | ICD-10-CM

## 2022-12-16 DIAGNOSIS — O211 Hyperemesis gravidarum with metabolic disturbance: Secondary | ICD-10-CM

## 2022-12-16 DIAGNOSIS — E0590 Thyrotoxicosis, unspecified without thyrotoxic crisis or storm: Secondary | ICD-10-CM

## 2022-12-16 DIAGNOSIS — O99282 Endocrine, nutritional and metabolic diseases complicating pregnancy, second trimester: Secondary | ICD-10-CM

## 2022-12-16 LAB — GC/CHLAMYDIA PROBE AMP (~~LOC~~) NOT AT ARMC
Chlamydia: NEGATIVE
Comment: NEGATIVE
Comment: NORMAL
Neisseria Gonorrhea: NEGATIVE

## 2022-12-16 LAB — EXTRA SPECIMEN

## 2022-12-16 LAB — THYROID STIMULATING IMMUNOGLOBULIN

## 2022-12-16 NOTE — Progress Notes (Signed)
   PRENATAL VISIT NOTE  Subjective:  Jody Robinson is a 32 y.o. [id number] at [redacted]w[redacted]d being seen today for ongoing prenatal care.  She is currently monitored for the following issues for this high-risk pregnancy and has History of 15wk loss (2020); Depression; Thrombocytosis; Hyperthyroidism affecting pregnancy in second trimester; Tourette's disease; Hyperemesis gravidarum with electrolyte imbalance; Supervision of high risk pregnancy, antepartum; History of herpes genitalis; Migraine headache; Obesity affecting pregnancy; History of renal insufficiency; and Fetal renal anomaly, single gestation (UTDA1, right) on their problem list.  Patient doing well with no acute concerns today. She reports no complaints.  Contractions: Not present. Vag. Bleeding: None.  Movement: Present. Denies leaking of fluid.   The following portions of the patient's history were reviewed and updated as appropriate: allergies, current medications, past family history, past medical history, past social history, past surgical history and problem list. Problem list updated.  Objective:   Vitals:   12/16/22 1127  BP: 110/79  Pulse: 90  Weight: 146 lb 1.6 oz (66.3 kg)    Fetal Status: Fetal Heart Rate (bpm): 157 Fundal Height: 21 cm Movement: Present     General:  Alert, oriented and cooperative. Patient is in no acute distress.  Skin: Skin is warm and dry. No rash noted.   Cardiovascular: Normal heart rate noted  Respiratory: Normal respiratory effort, no problems with respiration noted  Abdomen: Soft, gravid, appropriate for gestational age.  Pain/Pressure: Absent     Pelvic: Cervical exam deferred        Extremities: Normal range of motion.  Edema: None  Mental Status:  Normal mood and affect. Normal behavior. Normal judgment and thought content.   Assessment and Plan:  Pregnancy: [id number] at [redacted]w[redacted]d  1. [redacted] weeks gestation of pregnancy   2. Hyperemesis gravidarum with electrolyte imbalance Pt continues IV  infusions 1-2 /week  3. Hyperthyroidism affecting pregnancy in second trimester Pt has appt with endocrinologist in 1-2 weeks Pt also has hematology appt scheduled  4. Tourette's disease   5. Supervision of high risk pregnancy, antepartum Continue routine prenatal care  6. History of herpes genitalis Prophylaxis at 36 weeks  Preterm labor symptoms and general obstetric precautions including but not limited to vaginal bleeding, contractions, leaking of fluid and fetal movement were reviewed in detail with the patient.  Please refer to After Visit Summary for other counseling recommendations.   Return in about 4 weeks (around 01/13/2023) for [facility], in person.   Mariel Aloe, MD Faculty Attending Center for [facility]

## 2022-12-17 ENCOUNTER — Encounter: Payer: Self-pay | Admitting: "Endocrinology

## 2022-12-17 ENCOUNTER — Ambulatory Visit (INDEPENDENT_AMBULATORY_CARE_PROVIDER_SITE_OTHER): Payer: 59 | Admitting: "Endocrinology

## 2022-12-17 VITALS — BP 120/80 | HR 108 | Ht 60.0 in | Wt 147.8 lb

## 2022-12-17 DIAGNOSIS — Z8639 Personal history of other endocrine, nutritional and metabolic disease: Secondary | ICD-10-CM

## 2022-12-17 DIAGNOSIS — Z3A21 21 weeks gestation of pregnancy: Secondary | ICD-10-CM | POA: Diagnosis not present

## 2022-12-17 NOTE — Progress Notes (Signed)
Outpatient Endocrinology Note Jody [place], MD  12/17/22   Jody Robinson 1990-10-09 [id number]  Referring Provider: [place] Bing, MD Primary Care Provider: Pcp, No Subjective  No chief complaint on file.   Assessment & Plan  Diagnoses and all orders for this visit:  Personal history of hyperthyroidism -     TSH; Future -     T4, free; Future -     T3, free; Future  [redacted] weeks gestation of pregnancy    Jody Robinson is currently taking no thyroid medication. [redacted] week pregnant with 4th pregnancy, history of 2 normal and 1 miscarriage  Patient currently clinically and biochemically euthyroid.  TSH low and with high FT4 and normal FT3 in 09/2022. Normal TSH, FT4 and FT3 on 12/11/22. No medication needed at this point.  Educated on thyroid axis.  Recommend the following: Repeat labs in 2 months.   Repeat lab before next visit or sooner if symptoms of hyperthyroidism or hypothyroidism develop.  Counseled on thyroid symptoms, compliance and follow up needs  I spent more than 50% of today's visit counseling patient on symptoms, examination findings, lab findings, imaging results, treatment decisions and monitoring and prognosis. The patient understood the recommendations and agrees with the treatment plan. All questions regarding treatment plan were fully answered.   Return in about 8 weeks (around 02/11/2023).   Jody L[place], MD  12/17/22   I have reviewed current medications, nurse's notes, allergies, vital signs, past medical and surgical history, family medical history, and social history for this encounter. Counseled patient on symptoms, examination findings, lab findings, imaging results, treatment decisions and monitoring and prognosis. The patient understood the recommendations and agrees with the treatment plan. All questions regarding treatment plan were fully answered.   History of Present Illness Jody Robinson is a 32 y.o. year old female who  presents to our clinic with low TSH in pregnancy.   Pt is [redacted] weeks pregnant with her third baby (has one miscarriage).  EDD 04/28/23  Never been on any thyroid medication.  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  Symptoms suggestive of HYPERTHYROIDISM:  weight loss  + , not so hungry and throwing up  heat intolerance - but sometime feels hot at night  hyperdefecation  - palpitations  -  Compressive symptoms:  dysphagia  - dysphonia  - positional dyspnea (especially with simultaneous arms elevation)  -  Smokes  - On biotin  - Personal history of head/neck surgery/irradiation  -  Physical Exam  BP 120/80 (BP Location: Left Arm, Patient Position: Sitting, Cuff Size: Normal)   Pulse (!) 108   Ht 5' (1.524 m)   Wt 147 lb 12.8 oz (67 kg)   LMP 07/22/2022 (Exact Date)   SpO2 98%   BMI 28.87 kg/m  Constitutional: well developed, well nourished Head: normocephalic, atraumatic, no exophthalmos Eyes: sclera anicteric, no redness Neck: no thyromegaly, no thyroid tenderness; no nodules palpated Lungs: normal respiratory effort Neurology: alert and oriented, no fine hand tremor Skin: dry, no appreciable rashes Musculoskeletal: no appreciable defects Psychiatric: normal mood and affect  Allergies No Known Allergies  Current Medications Patient's Medications  New Prescriptions   No medications on file  Previous Medications   ACETAMINOPHEN (TYLENOL) 325 MG TABLET    Take 650 mg by mouth every 6 (six) hours as needed for mild pain or headache.   ACETAMINOPHEN-CAFFEINE (EXCEDRIN TENSION HEADACHE) 500-65 MG TABS PER TABLET    Take 1 tablet by mouth every 6 (six) hours as needed (Headache).  ASPIRIN EC 81 MG TABLET    Take 1 tablet (81 mg total) by mouth daily. Swallow whole.   BLOOD PRESSURE MONITORING DEVI    1 each by Does not apply route once a week.   FAMOTIDINE (PEPCID) 20 MG TABLET    Take 1 tablet (20 mg total) by mouth 2 (two) times daily.   METOCLOPRAMIDE  (REGLAN) 5 MG TABLET    Take 5 mg by mouth 4 (four) times daily.   METRONIDAZOLE (METROGEL) 0.75 % VAGINAL GEL    Place 1 Applicatorful vaginally at bedtime. Apply one applicatorful to vagina at bedtime for 5 days   ONDANSETRON (ZOFRAN-ODT) 4 MG DISINTEGRATING TABLET    Take 1 tablet (4 mg total) by mouth every 6 (six) hours as needed for nausea or vomiting.   PRENATAL VITAMIN W/FE, FA (PRENATAL 1 + 1) 27-1 MG TABS TABLET    Take 1 tablet by mouth daily at 12 noon.   PROMETHAZINE (PHENERGAN) 25 MG TABLET    Take 1 tablet (25 mg total) by mouth every 6 (six) hours as needed for nausea, vomiting or refractory nausea / vomiting.  Modified Medications   No medications on file  Discontinued Medications   No medications on file    Past Medical History Past Medical History:  Diagnosis Date   Abnormal Pap smear of cervix    Acute prerenal failure due to hyperemesis and pyelnonephritis(HCC) 10/03/2022   Anemia in pregnancy 03/01/2020   S/p IV feraheme 6/18 and 02/04/2020  CBC Latest Ref Rng & Units 02/06/2020 01/19/2020 12/21/2019 WBC 4.0 - 10.5 K/uL 17.3(H) 13.8(H) 14.9(H) Hemoglobin 12.0 - 15.0 g/dL 1.6(X) 8.3(L) 8.5(L) Hematocrit 36 - 46 % 32.7(L) 26.9(L) 26.7(L) Platelets 150 - 400 K/uL 483(H) 472(H) 559(H)     Depression    Elevated liver enzymes 10/13/2017   [ ]  rpt cmp with 28wk labs   Gallbladder sludge 08/22/2019   Herpes simplex 01/16/2013   History of abnormal cervical Pap smear 10/09/2017   2020: pap smear wnl   History of COVID-19 09/10/2019   COVID+ 09/08/19 symptomatic    History of IUFD 02/06/2019   Less than 20 weeks   Migraine without aura and without status migrainosus, not intractable 05/10/2022   Pancreatitis, acute 08/20/2019   Seasonal allergies 10/09/2017   Thrombocytosis 08/16/2019   CBC  Latest Ref Rng & Units  02/06/2020  01/19/2020  12/21/2019  WBC  4.0 - 10.5 K/uL  17.3(H)  13.8(H)  14.9(H)  Hemoglobin  12.0 - 15.0 g/dL  0.9(U)  8.3(L)  8.5(L)  Hematocrit  36 - 46 %   32.7(L)  26.9(L)  26.7(L)  Platelets  150 - 400 K/uL  483(H)  472(H)  559(H)         Tourette's syndrome    Trichomoniasis     Past Surgical History Past Surgical History:  Procedure Laterality Date   COLPOSCOPY      Family History family history includes Diabetes in her mother.  Social History Social History   Socioeconomic History   Marital status: Single    Spouse name: Not on file   Number of children: Not on file   Years of education: Not on file   Highest education level: Not on file  Occupational History   Not on file  Tobacco Use   Smoking status: Never   Smokeless tobacco: Never  Vaping Use   Vaping Use: Never used  Substance and Sexual Activity   Alcohol use: Not Currently    Comment: occasional  Drug use: Not Currently   Sexual activity: Yes    Birth control/protection: None  Other Topics Concern   Not on file  Social History Narrative   Not on file   Social Determinants of Health   Financial Resource Strain: Not on file  Food Insecurity: No Food Insecurity (07/17/2021)   Hunger Vital Sign    Worried About Running Out of Food in the Last Year: Never true    Ran Out of Food in the Last Year: Never true  Transportation Needs: No Transportation Needs (07/17/2021)   PRAPARE - Administrator, Civil Service (Medical): No    Lack of Transportation (Non-Medical): No  Physical Activity: Not on file  Stress: Not on file  Social Connections: Not on file  Intimate Partner Violence: Not on file    Laboratory Investigations Lab Results  Component Value Date   TSH 0.60 12/11/2022   FREET4 0.72 12/11/2022     Lab Results  Component Value Date   TSI CANCELED 12/11/2022         No components found for: "TRAB"   No results found for: "CHOL" No results found for: "HDL" No results found for: "LDLCALC" No results found for: "TRIG" No results found for: "CHOLHDL" Lab Results  Component Value Date   CREATININE 0.65 10/24/2022   No results  found for: "GFR"    Component Value Date/Time   NA 130 (L) 10/24/2022 1200   NA 135 10/09/2022 1044   K 3.1 (L) 10/24/2022 1200   CL 101 10/24/2022 1200   CO2 23 10/24/2022 1200   GLUCOSE 125 (H) 10/24/2022 1200   BUN 9 10/24/2022 1200   BUN 5 (L) 10/09/2022 1044   CREATININE 0.65 10/24/2022 1200   CALCIUM 8.2 (L) 10/24/2022 1200   PROT 6.7 10/24/2022 1200   PROT 6.8 10/09/2022 1044   ALBUMIN 3.0 (L) 10/24/2022 1200   ALBUMIN 3.9 10/09/2022 1044   AST 18 10/24/2022 1200   ALT 15 10/24/2022 1200   ALKPHOS 77 10/24/2022 1200   BILITOT 1.0 10/24/2022 1200   BILITOT 0.7 10/09/2022 1044   GFRNONAA >60 10/24/2022 1200   GFRAA >60 02/06/2020 2203      Latest Ref Rng & Units 10/24/2022   12:00 PM 10/17/2022   11:10 AM 10/09/2022   10:44 AM  BMP  Glucose 70 - 99 mg/dL 191  478  91   BUN 6 - 20 mg/dL 9  15  5    Creatinine 0.44 - 1.00 mg/dL 2.95  6.21  3.08   BUN/Creat Ratio 9 - 23   9   Sodium 135 - 145 mmol/L 130  135  135   Potassium 3.5 - 5.1 mmol/L 3.1  3.2  4.5   Chloride 98 - 111 mmol/L 101  97  99   CO2 22 - 32 mmol/L 23  23  22    Calcium 8.9 - 10.3 mg/dL 8.2  9.0  9.0        Component Value Date/Time   WBC 11.5 (H) 11/18/2022 1613   WBC 15.9 (H) 10/07/2022 1055   RBC 4.02 11/18/2022 1613   RBC 3.79 (L) 10/07/2022 1055   HGB 10.8 (L) 11/18/2022 1613   HCT 33.2 (L) 11/18/2022 1613   PLT 738 (H) 11/18/2022 1613   MCV 83 11/18/2022 1613   MCH 26.9 11/18/2022 1613   MCH 26.4 10/07/2022 1055   MCHC 32.5 11/18/2022 1613   MCHC 30.7 10/07/2022 1055   RDW 13.8 11/18/2022 1613  LYMPHSABS 2.3 10/09/2022 1044   MONOABS 0.9 10/04/2022 0513   EOSABS 0.1 10/09/2022 1044   BASOSABS 0.1 10/09/2022 1044      Parts of this note may have been dictated using voice recognition software. There may be variances in spelling and vocabulary which are unintentional. Not all errors are proofread. Please notify the Thereasa Parkin if any discrepancies are noted or if the meaning of any statement  is not clear.

## 2022-12-18 ENCOUNTER — Non-Acute Institutional Stay (HOSPITAL_COMMUNITY)
Admission: RE | Admit: 2022-12-18 | Discharge: 2022-12-18 | Disposition: A | Discharge status: Home / Self Care | Payer: 59 | Source: Ambulatory Visit | Attending: Internal Medicine | Admitting: Internal Medicine

## 2022-12-18 DIAGNOSIS — O211 Hyperemesis gravidarum with metabolic disturbance: Secondary | ICD-10-CM | POA: Diagnosis present

## 2022-12-18 DIAGNOSIS — Z3A00 Weeks of gestation of pregnancy not specified: Secondary | ICD-10-CM | POA: Insufficient documentation

## 2022-12-18 MED ORDER — SODIUM CHLORIDE 0.9 % IV SOLN
12.5000 mg | Freq: Once | INTRAVENOUS | Status: AC
  Administered 2022-12-18: 12.5 mg via INTRAVENOUS
  Filled 2022-12-18: qty 12.5

## 2022-12-18 MED ORDER — ONDANSETRON 8 MG/NS 50 ML IVPB
8.0000 mg | Freq: Once | INTRAVENOUS | Status: AC
  Administered 2022-12-18: 8 mg via INTRAVENOUS
  Filled 2022-12-18: qty 8

## 2022-12-18 MED ORDER — PANTOPRAZOLE SODIUM 40 MG IV SOLR
40.0000 mg | Freq: Once | INTRAVENOUS | Status: AC
  Administered 2022-12-18: 40 mg via INTRAVENOUS
  Filled 2022-12-18: qty 10

## 2022-12-18 MED ORDER — SODIUM CHLORIDE 0.9 % IV SOLN: INTRAVENOUS | Status: DC | PRN

## 2022-12-18 MED ORDER — LACTATED RINGERS IV BOLUS
1000.0000 mL | Freq: Once | INTRAVENOUS | Status: AC
  Administered 2022-12-18: 1000 mL via INTRAVENOUS

## 2022-12-18 NOTE — Progress Notes (Signed)
PATIENT CARE CENTER NOTE  Diagnosis: Hyperemesis gravidarum with electrolyte imbalance [O21.1]      Provider: Lyndel Safe MD   Procedure: Fluid bolus and anti-emetics (dose #18 of 30)   Note: Pt received 1 Litre Lactated Ringers fluid bolus over 1 hour, IVPB Phenergan 12.5 mg, IVPB Zofran 8 mg, and IV Protonix 40 mg via PIV. Pt tolerated infusion and medications well with no adverse reaction. Vital signs stable. Pt can come twice a week for a total of 30 doses. Pt advised to schedule next appointment at front desk. Pt is alert, oriented, and ambulatory at discharge.

## 2022-12-19 ENCOUNTER — Telehealth: Payer: Self-pay | Admitting: Family Medicine

## 2022-12-19 NOTE — Telephone Encounter (Signed)
Patient would like to know when she can return back to work.

## 2022-12-23 ENCOUNTER — Encounter: Payer: Self-pay | Admitting: General Practice

## 2022-12-23 NOTE — Telephone Encounter (Signed)
Called patient stating I am returning her phone call. Patient states she needs a note that she is cleared to go back to work as we had previously given her a letter taking her out. Told patient I can create that letter for her and it will go to her mychart account. Patient verbalized understanding and states her FMLA also needs office notes from 4/8 & 5/6. Told patient our front office staff will send that for her. Patient verbalized understanding.

## 2022-12-24 NOTE — Progress Notes (Signed)
[facility] [phone]   [phone]  INITIAL CONSULT NOTE  Patient Care Team: Pcp, No as PCP - General Van Tassell Bing, MD as PCP - OBGYN (Obstetrics and Gynecology)  Hematological/Oncological History # Thrombocytosis in Pregnancy  12/25/2022: establish care with Dr. Leonides Schanz   CHIEF COMPLAINTS/PURPOSE OF CONSULTATION:  "Thrombocytosis in Pregnancy  "  HISTORY OF PRESENTING ILLNESS:  Jody Robinson 32 y.o. female with medical history significant for iron deficiency anemia who presents for evaluation of thrombocytosis in pregnancy.  On review of the previous records Jody Robinson had labs drawn on 11/18/2022 which showed white blood cell count 11.5, hemoglobin 10.8, and platelets of 738.  Her MCV was 83.  Due to concern for these findings the patient was referred to hematology for further evaluation and management.  On exam today Jody Robinson that she will be giving birth to a baby girl and she is currently [redacted] weeks pregnant.  She notes that she has never had trouble with her blood before in the past, though she has taken iron pills during her last pregnancy.  She has unfortunately lost a baby before.  She last underwent an iron infusion back in 2020.  She reports that she does not have terribly heavy menstrual cycles they do last for approximately 3 to 4 days with her heaviest day requiring 4-5 pads.  She reports that she does currently have an unrestricted diet and does enjoy eating red meat which she does approximately 2-3 times per week.  She is not having any lightheadedness, dizziness, shortness of breath.  She denies any headaches but does have some occasional fatigue.  On further discussion Jody Robinson reports that her mother has type 2 diabetes and she is unsure of her father's history.  She reports her brother and sister are healthy.  She is a never smoker never drinker and previously worked as a Location manager.  Otherwise she is at baseline level  of health with no fevers, chills, sweats, nausea, vomiting or diarrhea.  A full 10 point ROS is otherwise negative.  MEDICAL HISTORY:  Past Medical History:  Diagnosis Date   Abnormal Pap smear of cervix    Acute prerenal failure due to hyperemesis and pyelnonephritis(HCC) 10/03/2022   Anemia in pregnancy 03/01/2020   S/p IV feraheme 6/18 and 02/04/2020  CBC Latest Ref Rng & Units 02/06/2020 01/19/2020 12/21/2019 WBC 4.0 - 10.5 K/uL 17.3(H) 13.8(H) 14.9(H) Hemoglobin 12.0 - 15.0 g/dL 4.5(W) 8.3(L) 8.5(L) Hematocrit 36 - 46 % 32.7(L) 26.9(L) 26.7(L) Platelets 150 - 400 K/uL 483(H) 472(H) 559(H)     Depression    Depression    Elevated liver enzymes 10/13/2017   [ ]  rpt cmp with 28wk labs   Gallbladder sludge 08/22/2019   Herpes simplex 01/16/2013   History of 15wk loss (2020) 02/06/2019   [ ]  f/u antiphosphlipid abs future lab (placed 4/22)  Less than 20 weeks   History of abnormal cervical Pap smear 10/09/2017   2020: pap smear wnl   History of COVID-19 09/10/2019   COVID+ 09/08/19 symptomatic    History of herpes genitalis 10/09/2022   History of IUFD 02/06/2019   Less than 20 weeks   History of renal insufficiency 12/02/2022   Due to pyelo and dehydration but need to trend Cr   Hyperemesis gravidarum with electrolyte imbalance 10/03/2022   Twice weekly infusions-- 1L LR, Phenergan, Zofran, Protonix for 15 weeks of therapy written by Dr Alvester Morin 2.26   Will get CMP x 4 weekly      [ ]   oral potassium?   Migraine headache 11/24/2022   Sees neurologist for migraines who manages Tourette's   Migraine without aura and without status migrainosus, not intractable 05/10/2022   Pancreatitis, acute 08/20/2019   Seasonal allergies 10/09/2017   Thrombocytosis 08/16/2019   CBC  Latest Ref Rng & Units  02/06/2020  01/19/2020  12/21/2019  WBC  4.0 - 10.5 K/uL  17.3(H)  13.8(H)  14.9(H)  Hemoglobin  12.0 - 15.0 g/dL  1.6(X)  8.3(L)  8.5(L)  Hematocrit  36 - 46 %  32.7(L)  26.9(L)  26.7(L)  Platelets  150 -  400 K/uL  483(H)  472(H)  559(H)         Tourette's syndrome    Trichomoniasis     SURGICAL HISTORY: Past Surgical History:  Procedure Laterality Date   COLPOSCOPY      SOCIAL HISTORY: Social History   Socioeconomic History   Marital status: Single    Spouse name: Not on file   Number of children: Not on file   Years of education: Not on file   Highest education level: Not on file  Occupational History   Not on file  Tobacco Use   Smoking status: Never   Smokeless tobacco: Never  Vaping Use   Vaping Use: Never used  Substance and Sexual Activity   Alcohol use: Not Currently    Comment: occasional   Drug use: Not Currently   Sexual activity: Yes    Birth control/protection: None  Other Topics Concern   Not on file  Social History Narrative   Not on file   Social Determinants of Health   Financial Resource Strain: Not on file  Food Insecurity: No Food Insecurity (07/17/2021)   Hunger Vital Sign    Worried About Running Out of Food in the Last Year: Never true    Ran Out of Food in the Last Year: Never true  Transportation Needs: No Transportation Needs (07/17/2021)   PRAPARE - Administrator, Civil Service (Medical): No    Lack of Transportation (Non-Medical): No  Physical Activity: Not on file  Stress: Not on file  Social Connections: Not on file  Intimate Partner Violence: Not on file    FAMILY HISTORY: Family History  Problem Relation Age of Onset   Diabetes Mother     ALLERGIES:  has No Known Allergies.  MEDICATIONS:  Current Outpatient Medications  Medication Sig Dispense Refill   acetaminophen (TYLENOL) 325 MG tablet Take 650 mg by mouth every 6 (six) hours as needed for mild pain or headache.     acetaminophen-caffeine (EXCEDRIN TENSION HEADACHE) 500-65 MG TABS per tablet Take 1 tablet by mouth every 6 (six) hours as needed (Headache). 60 tablet 0   aspirin EC 81 MG tablet Take 1 tablet (81 mg total) by mouth daily. Swallow whole. 30  tablet 12   Blood Pressure Monitoring DEVI 1 each by Does not apply route once a week. 1 each 0   famotidine (PEPCID) 20 MG tablet Take 1 tablet (20 mg total) by mouth 2 (two) times daily. 60 tablet 2   metoCLOPramide (REGLAN) 5 MG tablet Take 5 mg by mouth 4 (four) times daily.     ondansetron (ZOFRAN-ODT) 4 MG disintegrating tablet Take 1 tablet (4 mg total) by mouth every 6 (six) hours as needed for nausea or vomiting. 30 tablet 3   prenatal vitamin w/FE, FA (PRENATAL 1 + 1) 27-1 MG TABS tablet Take 1 tablet by mouth daily at 12 noon. 30  tablet 11   promethazine (PHENERGAN) 25 MG tablet Take 1 tablet (25 mg total) by mouth every 6 (six) hours as needed for nausea, vomiting or refractory nausea / vomiting. 30 tablet 2   No current facility-administered medications for this visit.    REVIEW OF SYSTEMS:   Constitutional: ( - ) fevers, ( - )  chills , ( - ) night sweats Eyes: ( - ) blurriness of vision, ( - ) double vision, ( - ) watery eyes Ears, nose, mouth, throat, and face: ( - ) mucositis, ( - ) sore throat Respiratory: ( - ) cough, ( - ) dyspnea, ( - ) wheezes Cardiovascular: ( - ) palpitation, ( - ) chest discomfort, ( - ) lower extremity swelling Gastrointestinal:  ( - ) nausea, ( - ) heartburn, ( - ) change in bowel habits Skin: ( - ) abnormal skin rashes Lymphatics: ( - ) new lymphadenopathy, ( - ) easy bruising Neurological: ( - ) numbness, ( - ) tingling, ( - ) new weaknesses Behavioral/Psych: ( - ) mood change, ( - ) new changes  All other systems were reviewed with the patient and are negative.  PHYSICAL EXAMINATION:  Vitals:   12/25/22 1343  BP: 118/73  Pulse: 71  Resp: 15  Temp: 97.8 F (36.6 C)  SpO2: 100%   Filed Weights   12/25/22 1343  Weight: 155 lb 8 oz (70.5 kg)    GENERAL: well appearing middle-aged African-American female in NAD  SKIN: skin color, texture, turgor are normal, no rashes or significant lesions EYES: conjunctiva are pink and non-injected,  sclera clear LUNGS: clear to auscultation and percussion with normal breathing effort HEART: regular rate & rhythm and no murmurs and no lower extremity edema Musculoskeletal: no cyanosis of digits and no clubbing  PSYCH: alert & oriented x 3, fluent speech NEURO: no focal motor/sensory deficits  LABORATORY DATA:  I have reviewed the data as listed    Latest Ref Rng & Units 12/25/2022    2:21 PM 11/18/2022    4:13 PM 10/09/2022   10:44 AM  CBC  WBC 4.0 - 10.5 K/uL 11.1  11.5  17.7   Hemoglobin 12.0 - 15.0 g/dL 9.2  16.1  09.6   Hematocrit 36.0 - 46.0 % 29.2  33.2  32.9   Platelets 150 - 400 K/uL 551  738  786        Latest Ref Rng & Units 12/25/2022    2:21 PM 10/24/2022   12:00 PM 10/17/2022   11:10 AM  CMP  Glucose 70 - 99 mg/dL 77  045  409   BUN 6 - 20 mg/dL 5  9  15    Creatinine 0.44 - 1.00 mg/dL 8.11  9.14  7.82   Sodium 135 - 145 mmol/L 138  130  135   Potassium 3.5 - 5.1 mmol/L 3.6  3.1  3.2   Chloride 98 - 111 mmol/L 108  101  97   CO2 22 - 32 mmol/L 24  23  23    Calcium 8.9 - 10.3 mg/dL 8.0  8.2  9.0   Total Protein 6.5 - 8.1 g/dL 6.2  6.7  8.6   Total Bilirubin 0.3 - 1.2 mg/dL 0.3  1.0  1.3   Alkaline Phos 38 - 126 U/L 68  77  99   AST 15 - 41 U/L 11  18  17    ALT 0 - 44 U/L 7  15  15       ASSESSMENT &  PLAN Jody Robinson 32 y.o. female with medical history significant for iron deficiency anemia who presents for evaluation of thrombocytosis in pregnancy.  After review of the labs, review of the records, and discussion with the patient the patients findings are most consistent with iron deficiency anemia in the setting of pregnancy  # Iron Deficiency Anemia in Setting of Pregnancy # Thrombocytosis 2/2 to Iron Deficiency  -- Findings are consistent with iron deficiency anemia secondary to pregnancy.  Patient likely had low iron stores due to menstrual bleeding prior to pregnancy. --We will confirm iron deficiency anemia by ordering iron panel and ferritin as  well as reticulocytes, CBC, and CMP -- Recommend avoiding the use of p.o. iron therapy after the first trimester as it can cause constipation and is poorly absorbed. --We will plan to proceed with IV iron therapy in order to help bolster the patient's blood counts --Plan for return to clinic in 4 to 6 weeks time after last dose of IV iron   Orders Placed This Encounter  Procedures   CBC with Differential (Cancer Center Only)    Standing Status:   Future    Number of Occurrences:   1    Standing Expiration Date:   12/25/2023   CMP (Cancer Center only)    Standing Status:   Future    Number of Occurrences:   1    Standing Expiration Date:   12/25/2023   Ferritin    Standing Status:   Future    Number of Occurrences:   1    Standing Expiration Date:   12/25/2023   Iron and Iron Binding Capacity ([facility] only)    Standing Status:   Future    Number of Occurrences:   1    Standing Expiration Date:   12/25/2023   Retic Panel    Standing Status:   Future    Number of Occurrences:   1    Standing Expiration Date:   12/25/2023   Sedimentation rate    Standing Status:   Future    Number of Occurrences:   1    Standing Expiration Date:   12/25/2023   C-reactive protein    Standing Status:   Future    Number of Occurrences:   1    Standing Expiration Date:   12/25/2023    All questions were answered. The patient knows to call the clinic with any problems, questions or concerns.  A total of more than 60 minutes were spent on this encounter with face-to-face time and non-face-to-face time, including preparing to see the patient, ordering tests and/or medications, counseling the patient and coordination of care as outlined above.   Ulysees Barns, MD Department of Hematology/Oncology [facility] Phone: [phone] Pager: [phone] Email: [email or web address]  01/06/2023 6:13 PM

## 2022-12-25 ENCOUNTER — Inpatient Hospital Stay: Payer: 59

## 2022-12-25 ENCOUNTER — Other Ambulatory Visit: Payer: Self-pay

## 2022-12-25 ENCOUNTER — Non-Acute Institutional Stay (HOSPITAL_COMMUNITY)
Admission: RE | Admit: 2022-12-25 | Discharge: 2022-12-25 | Disposition: A | Discharge status: Home / Self Care | Payer: 59 | Source: Ambulatory Visit | Attending: Internal Medicine | Admitting: Internal Medicine

## 2022-12-25 ENCOUNTER — Inpatient Hospital Stay: Payer: 59 | Attending: Hematology and Oncology | Admitting: Hematology and Oncology

## 2022-12-25 VITALS — BP 118/73 | HR 71 | Temp 97.8°F | Resp 15 | Wt 155.5 lb

## 2022-12-25 DIAGNOSIS — Z3A22 22 weeks gestation of pregnancy: Secondary | ICD-10-CM | POA: Insufficient documentation

## 2022-12-25 DIAGNOSIS — Z7982 Long term (current) use of aspirin: Secondary | ICD-10-CM | POA: Diagnosis not present

## 2022-12-25 DIAGNOSIS — O99012 Anemia complicating pregnancy, second trimester: Secondary | ICD-10-CM | POA: Diagnosis not present

## 2022-12-25 DIAGNOSIS — O211 Hyperemesis gravidarum with metabolic disturbance: Secondary | ICD-10-CM | POA: Diagnosis present

## 2022-12-25 DIAGNOSIS — O99112 Other diseases of the blood and blood-forming organs and certain disorders involving the immune mechanism complicating pregnancy, second trimester: Secondary | ICD-10-CM | POA: Diagnosis not present

## 2022-12-25 DIAGNOSIS — D75839 Thrombocytosis, unspecified: Secondary | ICD-10-CM

## 2022-12-25 DIAGNOSIS — D509 Iron deficiency anemia, unspecified: Secondary | ICD-10-CM | POA: Diagnosis not present

## 2022-12-25 LAB — RETIC PANEL
Immature Retic Fract: 14.4 % (ref 2.3–15.9)
RBC.: 3.38 MIL/uL — ABNORMAL LOW (ref 3.87–5.11)
Retic Count, Absolute: 81.1 10*3/uL (ref 19.0–186.0)
Retic Ct Pct: 2.4 % (ref 0.4–3.1)
Reticulocyte Hemoglobin: 27.6 pg — ABNORMAL LOW (ref >27.9)

## 2022-12-25 LAB — CBC WITH DIFFERENTIAL (CANCER CENTER ONLY)
Abs Immature Granulocytes: 0.05 10*3/uL (ref 0.00–0.07)
Basophils Absolute: 0 10*3/uL (ref 0.0–0.1)
Basophils Relative: 0 %
Eosinophils Absolute: 0 10*3/uL (ref 0.0–0.5)
Eosinophils Relative: 0 %
HCT: 29.2 % — ABNORMAL LOW (ref 36.0–46.0)
Hemoglobin: 9.2 g/dL — ABNORMAL LOW (ref 12.0–15.0)
Immature Granulocytes: 1 %
Lymphocytes Relative: 22 %
Lymphs Abs: 2.4 10*3/uL (ref 0.7–4.0)
MCH: 26.9 pg (ref 26.0–34.0)
MCHC: 31.5 g/dL (ref 30.0–36.0)
MCV: 85.4 fL (ref 80.0–100.0)
Monocytes Absolute: 0.6 10*3/uL (ref 0.1–1.0)
Monocytes Relative: 6 %
Neutro Abs: 7.9 10*3/uL — ABNORMAL HIGH (ref 1.7–7.7)
Neutrophils Relative %: 71 %
Platelet Count: 551 10*3/uL — ABNORMAL HIGH (ref 150–400)
RBC: 3.42 MIL/uL — ABNORMAL LOW (ref 3.87–5.11)
RDW: 13.8 % (ref 11.5–15.5)
WBC Count: 11.1 10*3/uL — ABNORMAL HIGH (ref 4.0–10.5)
nRBC: 0 % (ref 0.0–0.2)

## 2022-12-25 LAB — CMP (CANCER CENTER ONLY)
ALT: 7 U/L (ref 0–44)
AST: 11 U/L — ABNORMAL LOW (ref 15–41)
Albumin: 3.2 g/dL — ABNORMAL LOW (ref 3.5–5.0)
Alkaline Phosphatase: 68 U/L (ref 38–126)
Anion gap: 6 (ref 5–15)
BUN: 5 mg/dL — ABNORMAL LOW (ref 6–20)
CO2: 24 mmol/L (ref 22–32)
Calcium: 8 mg/dL — ABNORMAL LOW (ref 8.9–10.3)
Chloride: 108 mmol/L (ref 98–111)
Creatinine: 0.55 mg/dL (ref 0.44–1.00)
GFR, Estimated: >60 mL/min (ref >60)
Glucose, Bld: 77 mg/dL (ref 70–99)
Potassium: 3.6 mmol/L (ref 3.5–5.1)
Sodium: 138 mmol/L (ref 135–145)
Total Bilirubin: 0.3 mg/dL (ref 0.3–1.2)
Total Protein: 6.2 g/dL — ABNORMAL LOW (ref 6.5–8.1)

## 2022-12-25 LAB — IRON AND IRON BINDING CAPACITY (CC-WL,HP ONLY)
Iron: 49 ug/dL (ref 28–170)
Saturation Ratios: 11 % (ref 10.4–31.8)
TIBC: 435 ug/dL (ref 250–450)
UIBC: 386 ug/dL (ref 148–442)

## 2022-12-25 LAB — SEDIMENTATION RATE: Sed Rate: 48 mm/hr — ABNORMAL HIGH (ref 0–22)

## 2022-12-25 LAB — C-REACTIVE PROTEIN: CRP: 1.4 mg/dL — ABNORMAL HIGH (ref <1.0)

## 2022-12-25 LAB — FERRITIN: Ferritin: 5 ng/mL — ABNORMAL LOW (ref 11–307)

## 2022-12-25 MED ORDER — SODIUM CHLORIDE 0.9 % IV SOLN: INTRAVENOUS | Status: DC | PRN

## 2022-12-25 MED ORDER — PANTOPRAZOLE SODIUM 40 MG IV SOLR
40.0000 mg | Freq: Once | INTRAVENOUS | Status: AC
  Administered 2022-12-25: 40 mg via INTRAVENOUS
  Filled 2022-12-25: qty 10

## 2022-12-25 MED ORDER — LACTATED RINGERS IV BOLUS
1000.0000 mL | Freq: Once | INTRAVENOUS | Status: AC
  Administered 2022-12-25: 1000 mL via INTRAVENOUS

## 2022-12-25 MED ORDER — SODIUM CHLORIDE 0.9 % IV SOLN
8.0000 mg | Freq: Once | INTRAVENOUS | Status: AC
  Administered 2022-12-25: 8 mg via INTRAVENOUS
  Filled 2022-12-25: qty 4

## 2022-12-25 MED ORDER — SODIUM CHLORIDE 0.9 % IV SOLN
12.5000 mg | Freq: Four times a day (QID) | INTRAVENOUS | Status: DC | PRN
  Administered 2022-12-25: 12.5 mg via INTRAVENOUS
  Filled 2022-12-25: qty 12.5

## 2022-12-25 NOTE — Progress Notes (Signed)
PATIENT CARE CENTER NOTE  Diagnosis:Hyperemesis gravidarum with electrolyte imbalance [O21.1]      Provider: Lyndel Safe MD   Procedure: Fluid bolus and anti-emetics (dose #19 of 30)   Note: Patient received 1 Litre Lactated Ringers fluid bolus over 1 hour, IVPB Phenergan 12.5 mg, IVPB Zofran 8 mg, and IV Protonix 40 mg via PIV. Pt tolerated infusion and medications well with no adverse reaction. Vital signs stable. Pt can come twice a week for a total of 30 doses. Pt advised to schedule next appointment at front desk. Pt is alert, oriented, and ambulatory at discharge.

## 2022-12-31 ENCOUNTER — Non-Acute Institutional Stay (HOSPITAL_COMMUNITY)
Admission: RE | Admit: 2022-12-31 | Discharge: 2022-12-31 | Disposition: A | Discharge status: Home / Self Care | Payer: 59 | Source: Ambulatory Visit | Attending: Internal Medicine | Admitting: Internal Medicine

## 2022-12-31 DIAGNOSIS — Z3A00 Weeks of gestation of pregnancy not specified: Secondary | ICD-10-CM | POA: Insufficient documentation

## 2022-12-31 DIAGNOSIS — O211 Hyperemesis gravidarum with metabolic disturbance: Secondary | ICD-10-CM | POA: Diagnosis not present

## 2022-12-31 MED ORDER — LACTATED RINGERS IV BOLUS
1000.0000 mL | Freq: Once | INTRAVENOUS | Status: AC
  Administered 2022-12-31: 1000 mL via INTRAVENOUS

## 2022-12-31 MED ORDER — SODIUM CHLORIDE 0.9 % IV SOLN
8.0000 mg | Freq: Once | INTRAVENOUS | Status: AC
  Administered 2022-12-31: 8 mg via INTRAVENOUS
  Filled 2022-12-31: qty 4

## 2022-12-31 MED ORDER — SODIUM CHLORIDE 0.9 % IV SOLN
12.5000 mg | Freq: Once | INTRAVENOUS | Status: AC
  Administered 2022-12-31: 12.5 mg via INTRAVENOUS
  Filled 2022-12-31: qty 12.5

## 2022-12-31 MED ORDER — SODIUM CHLORIDE 0.9 % IV SOLN: INTRAVENOUS | Status: DC | PRN

## 2022-12-31 MED ORDER — PANTOPRAZOLE SODIUM 40 MG IV SOLR
40.0000 mg | Freq: Once | INTRAVENOUS | Status: AC
  Administered 2022-12-31: 40 mg via INTRAVENOUS
  Filled 2022-12-31: qty 10

## 2022-12-31 NOTE — Progress Notes (Signed)
PATIENT CARE CENTER NOTE  Diagnosis: Hyperemesis gravidarum with electrolyte imbalance [O21.1]      Provider: Lyndel Safe MD  Procedure:  Fluid bolus and anti-emetics (#20 of 30)  Note:  Pt received 1 Litre Lactated Ringers fluid bolus over 1 hour, IVPB Phenergan 12.5 mg, IVPB Zofran 8 mg, and IV Protonix 40 mg via PIV. Pt tolerated infusion and medications well with no adverse reaction. Vital signs stable. Pt can come twice a week for a total of 30 doses. AVS offered, but pt declined.Pt advised to schedule next appointment at front desk. Pt is alert, oriented, and ambulatory at discharge.

## 2023-01-08 ENCOUNTER — Ambulatory Visit: Payer: 59 | Attending: Obstetrics and Gynecology

## 2023-01-08 ENCOUNTER — Telehealth: Payer: Self-pay | Admitting: *Deleted

## 2023-01-08 ENCOUNTER — Ambulatory Visit: Payer: 59 | Admitting: *Deleted

## 2023-01-08 VITALS — BP 115/57 | HR 85

## 2023-01-08 DIAGNOSIS — O35EXX0 Maternal care for other (suspected) fetal abnormality and damage, fetal genitourinary anomalies, not applicable or unspecified: Secondary | ICD-10-CM

## 2023-01-08 DIAGNOSIS — O0990 Supervision of high risk pregnancy, unspecified, unspecified trimester: Secondary | ICD-10-CM | POA: Insufficient documentation

## 2023-01-08 DIAGNOSIS — O99012 Anemia complicating pregnancy, second trimester: Secondary | ICD-10-CM

## 2023-01-08 DIAGNOSIS — E0590 Thyrotoxicosis, unspecified without thyrotoxic crisis or storm: Secondary | ICD-10-CM

## 2023-01-08 DIAGNOSIS — O99112 Other diseases of the blood and blood-forming organs and certain disorders involving the immune mechanism complicating pregnancy, second trimester: Secondary | ICD-10-CM

## 2023-01-08 DIAGNOSIS — O09292 Supervision of pregnancy with other poor reproductive or obstetric history, second trimester: Secondary | ICD-10-CM

## 2023-01-08 DIAGNOSIS — Z362 Encounter for other antenatal screening follow-up: Secondary | ICD-10-CM | POA: Insufficient documentation

## 2023-01-08 DIAGNOSIS — D649 Anemia, unspecified: Secondary | ICD-10-CM | POA: Diagnosis not present

## 2023-01-08 DIAGNOSIS — O99212 Obesity complicating pregnancy, second trimester: Secondary | ICD-10-CM | POA: Insufficient documentation

## 2023-01-08 DIAGNOSIS — D75839 Thrombocytosis, unspecified: Secondary | ICD-10-CM

## 2023-01-08 DIAGNOSIS — O99282 Endocrine, nutritional and metabolic diseases complicating pregnancy, second trimester: Secondary | ICD-10-CM

## 2023-01-08 DIAGNOSIS — Z3A24 24 weeks gestation of pregnancy: Secondary | ICD-10-CM

## 2023-01-08 NOTE — Telephone Encounter (Signed)
Advised per Dr. Leonides Schanz that this patient will need IV iron. As she is pregnant, this will need to be done @ [facility] Short Stay.  TCT to Short Stay and received appt date of 01/17/23 @ 12noon . Pt's 2nd iron infusion appt will be scheduled while she is there on 01/17/23. TCT patient and advised that Dr. Leonides Schanz recommends IV iron. Advised of her 1st appt on 01/17/23. Pt voiced understanding. Advised of the location.

## 2023-01-10 ENCOUNTER — Non-Acute Institutional Stay (HOSPITAL_COMMUNITY)
Admission: RE | Admit: 2023-01-10 | Discharge: 2023-01-10 | Disposition: A | Discharge status: Home / Self Care | Payer: 59 | Source: Ambulatory Visit | Attending: Internal Medicine | Admitting: Internal Medicine

## 2023-01-10 DIAGNOSIS — O211 Hyperemesis gravidarum with metabolic disturbance: Secondary | ICD-10-CM | POA: Insufficient documentation

## 2023-01-10 MED ORDER — LACTATED RINGERS IV BOLUS
1000.0000 mL | Freq: Once | INTRAVENOUS | Status: AC
  Administered 2023-01-10: 1000 mL via INTRAVENOUS

## 2023-01-10 MED ORDER — SODIUM CHLORIDE 0.9 % IV SOLN: INTRAVENOUS | Status: DC | PRN

## 2023-01-10 MED ORDER — ONDANSETRON 8 MG/NS 50 ML IVPB
8.0000 mg | Freq: Once | INTRAVENOUS | Status: AC
  Administered 2023-01-10: 8 mg via INTRAVENOUS
  Filled 2023-01-10: qty 54

## 2023-01-10 MED ORDER — PANTOPRAZOLE SODIUM 40 MG IV SOLR
40.0000 mg | Freq: Once | INTRAVENOUS | Status: AC
  Administered 2023-01-10: 40 mg via INTRAVENOUS
  Filled 2023-01-10: qty 10

## 2023-01-10 MED ORDER — SODIUM CHLORIDE 0.9 % IV SOLN
12.5000 mg | Freq: Once | INTRAVENOUS | Status: AC
  Administered 2023-01-10: 12.5 mg via INTRAVENOUS
  Filled 2023-01-10: qty 12.5

## 2023-01-10 NOTE — Progress Notes (Signed)
PATIENT CARE CENTER NOTE  Diagnosis: Hyperemesis gravidarum with electrolyte imbalance [O21.1]      Provider: Lyndel Safe MD   Procedure: Fluid bolus and anti-emetics (#21 of 30)   Note: Patient received 1 Litre Lactated Ringers fluid bolus over 1 hour, IVPB Phenrgan 12.5 mg, IVPB Zofran 8 mg, and IV Protonix 40 mg via PIV. Pt tolerated infusion and medications with no adverse reaction. Vital signs stable. Pt can come twice a weel for a total of 30 doses. Pt advised to schedule next appointment at front desk. AVS offered, but pt declined. Pt is alert, oriented, and ambulatory at discharge.

## 2023-01-13 ENCOUNTER — Other Ambulatory Visit: Payer: Self-pay

## 2023-01-13 ENCOUNTER — Ambulatory Visit (INDEPENDENT_AMBULATORY_CARE_PROVIDER_SITE_OTHER): Payer: 59 | Admitting: Obstetrics and Gynecology

## 2023-01-13 ENCOUNTER — Encounter: Payer: Self-pay | Admitting: Obstetrics and Gynecology

## 2023-01-13 VITALS — BP 121/77 | HR 95 | Wt 148.3 lb

## 2023-01-13 DIAGNOSIS — O211 Hyperemesis gravidarum with metabolic disturbance: Secondary | ICD-10-CM

## 2023-01-13 DIAGNOSIS — O35EXX0 Maternal care for other (suspected) fetal abnormality and damage, fetal genitourinary anomalies, not applicable or unspecified: Secondary | ICD-10-CM

## 2023-01-13 DIAGNOSIS — O99282 Endocrine, nutritional and metabolic diseases complicating pregnancy, second trimester: Secondary | ICD-10-CM

## 2023-01-13 DIAGNOSIS — Z0289 Encounter for other administrative examinations: Secondary | ICD-10-CM

## 2023-01-13 DIAGNOSIS — Z8619 Personal history of other infectious and parasitic diseases: Secondary | ICD-10-CM

## 2023-01-13 DIAGNOSIS — D75839 Thrombocytosis, unspecified: Secondary | ICD-10-CM

## 2023-01-13 DIAGNOSIS — E0590 Thyrotoxicosis, unspecified without thyrotoxic crisis or storm: Secondary | ICD-10-CM

## 2023-01-13 DIAGNOSIS — O0990 Supervision of high risk pregnancy, unspecified, unspecified trimester: Secondary | ICD-10-CM

## 2023-01-13 DIAGNOSIS — O99212 Obesity complicating pregnancy, second trimester: Secondary | ICD-10-CM

## 2023-01-13 DIAGNOSIS — Z3A25 25 weeks gestation of pregnancy: Secondary | ICD-10-CM

## 2023-01-13 NOTE — Progress Notes (Signed)
   PRENATAL VISIT NOTE  Subjective:  Jody Robinson is a 32 y.o. [id number] at [redacted]w[redacted]d being seen today for ongoing prenatal care.  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  Patient reports nausea.  Contractions: Irritability. Vag. Bleeding: None.  Movement: Present. Denies leaking of fluid.   The following portions of the patient's history were reviewed and updated as appropriate: allergies, current medications, past family history, past medical history, past social history, past surgical history and problem list.   Objective:   Vitals:   01/13/23 1054  BP: 121/77  Pulse: 95  Weight: 67.3 kg    Fetal Status: Fetal Heart Rate (bpm): 160   Movement: Present     General:  Alert, oriented and cooperative. Patient is in no acute distress.  Skin: Skin is warm and dry. No rash noted.   Cardiovascular: Normal heart rate noted  Respiratory: Normal respiratory effort, no problems with respiration noted  Abdomen: Soft, gravid, appropriate for gestational age.  Pain/Pressure: Present     Pelvic: Cervical exam deferred        Extremities: Normal range of motion.  Edema: None  Mental Status: Normal mood and affect. Normal behavior. Normal judgment and thought content.   Assessment and Plan:  Pregnancy: [id number] at [redacted]w[redacted]d  1. Fetal renal anomaly, single gestation (UTDA1, right)  2. Thrombocytosis  3. Supervision of high risk pregnancy, antepartum  4. Obesity affecting pregnancy in second trimester, unspecified obesity type  5. 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 [redacted] weeks gestation of pregnancy  7. Hyperemesis  gravidarum with electrolyte imbalance Getting biweekly IV infusions Requesting Rx for Ensure, gave [facility] letter  8. History of herpes genitalis Ppx at 36 weeks   Preterm labor symptoms and general obstetric precautions including but not limited to vaginal bleeding, contractions, leaking of fluid and fetal movement were reviewed in detail with the patient. Please refer to After Visit Summary for other counseling recommendations.   Return in about 2 weeks (around 01/27/2023) for high OB.  Future Appointments  Date Time Provider Department Center  01/17/2023 12:00 PM [facility] None  02/06/2023  9:45 AM [facility] None  02/11/2023  1:00 PM Altamese Delta, MD [facility] None  02/20/2023 11:15 AM [facility] None  02/20/2023 11:40 AM Jaci Standard, MD [facility] None    Conan Bowens, MD

## 2023-01-14 ENCOUNTER — Non-Acute Institutional Stay (HOSPITAL_COMMUNITY)
Admission: RE | Admit: 2023-01-14 | Discharge: 2023-01-14 | Disposition: A | Discharge status: Home / Self Care | Payer: 59 | Source: Ambulatory Visit | Attending: Internal Medicine | Admitting: Internal Medicine

## 2023-01-14 DIAGNOSIS — Z3A00 Weeks of gestation of pregnancy not specified: Secondary | ICD-10-CM | POA: Diagnosis not present

## 2023-01-14 DIAGNOSIS — O211 Hyperemesis gravidarum with metabolic disturbance: Secondary | ICD-10-CM | POA: Insufficient documentation

## 2023-01-14 MED ORDER — SODIUM CHLORIDE 0.9 % IV SOLN: INTRAVENOUS | Status: DC | PRN

## 2023-01-14 MED ORDER — ONDANSETRON 8 MG/NS 50 ML IVPB
8.0000 mg | Freq: Once | INTRAVENOUS | Status: AC
  Administered 2023-01-14: 8 mg via INTRAVENOUS
  Filled 2023-01-14: qty 54

## 2023-01-14 MED ORDER — LACTATED RINGERS IV BOLUS
1000.0000 mL | Freq: Once | INTRAVENOUS | Status: AC
  Administered 2023-01-14: 1000 mL via INTRAVENOUS

## 2023-01-14 MED ORDER — SODIUM CHLORIDE 0.9 % IV SOLN
12.5000 mg | Freq: Once | INTRAVENOUS | Status: AC
  Administered 2023-01-14: 12.5 mg via INTRAVENOUS
  Filled 2023-01-14: qty 12.5

## 2023-01-14 MED ORDER — SODIUM CHLORIDE 0.9 % IV SOLN
8.0000 mg | Freq: Once | INTRAVENOUS | Status: DC
  Filled 2023-01-14: qty 4

## 2023-01-14 MED ORDER — PANTOPRAZOLE SODIUM 40 MG IV SOLR
40.0000 mg | Freq: Once | INTRAVENOUS | Status: AC
  Administered 2023-01-14: 40 mg via INTRAVENOUS
  Filled 2023-01-14: qty 10

## 2023-01-14 NOTE — Progress Notes (Signed)
PATIENT CARE CENTER NOTE   Diagnosis: Hyperemesis gravidarum with electrolyte imbalance [O21.1]      Provider: Lyndel Safe MD    Procedure: Fluid bolus and anti-emetics (#22 of 30)    Note: Patient received 1 Liter Lactated Ringers fluid bolus over 1 hour, IVPB Phenrgan 12.5 mg, IVPB Zofran 8 mg, and IV Protonix 40 mg via PIV. Patient tolerated infusion and medications with no adverse reaction. Vital signs stable. Patient can come twice a week for a total of 30 doses. Patient advised to schedule next appointment at front desk. AVS offered but patient declined. Patient is alert, oriented, and ambulatory at discharge.

## 2023-01-17 ENCOUNTER — Encounter (HOSPITAL_COMMUNITY)
Admission: RE | Admit: 2023-01-17 | Discharge: 2023-01-17 | Disposition: A | Discharge status: Home / Self Care | Payer: 59 | Source: Ambulatory Visit | Attending: Hematology and Oncology | Admitting: Hematology and Oncology

## 2023-01-17 DIAGNOSIS — D75839 Thrombocytosis, unspecified: Secondary | ICD-10-CM | POA: Insufficient documentation

## 2023-01-17 MED ORDER — SODIUM CHLORIDE 0.9 % IV SOLN
510.0000 mg | INTRAVENOUS | Status: DC
  Administered 2023-01-17: 510 mg via INTRAVENOUS
  Filled 2023-01-17: qty 510

## 2023-01-19 ENCOUNTER — Other Ambulatory Visit: Payer: Self-pay

## 2023-01-19 ENCOUNTER — Inpatient Hospital Stay (HOSPITAL_COMMUNITY)
Admission: AD | Admit: 2023-01-19 | Discharge: 2023-01-19 | Disposition: A | Discharge status: Home / Self Care | Payer: 59 | Attending: Obstetrics and Gynecology | Admitting: Obstetrics and Gynecology

## 2023-01-19 ENCOUNTER — Encounter (HOSPITAL_COMMUNITY): Payer: Self-pay | Admitting: Obstetrics and Gynecology

## 2023-01-19 DIAGNOSIS — O212 Late vomiting of pregnancy: Secondary | ICD-10-CM | POA: Diagnosis present

## 2023-01-19 DIAGNOSIS — R519 Headache, unspecified: Secondary | ICD-10-CM

## 2023-01-19 DIAGNOSIS — E876 Hypokalemia: Secondary | ICD-10-CM | POA: Diagnosis not present

## 2023-01-19 DIAGNOSIS — Z3A25 25 weeks gestation of pregnancy: Secondary | ICD-10-CM | POA: Diagnosis not present

## 2023-01-19 DIAGNOSIS — O26893 Other specified pregnancy related conditions, third trimester: Secondary | ICD-10-CM

## 2023-01-19 DIAGNOSIS — O211 Hyperemesis gravidarum with metabolic disturbance: Secondary | ICD-10-CM | POA: Diagnosis not present

## 2023-01-19 DIAGNOSIS — O99282 Endocrine, nutritional and metabolic diseases complicating pregnancy, second trimester: Secondary | ICD-10-CM

## 2023-01-19 DIAGNOSIS — O26892 Other specified pregnancy related conditions, second trimester: Secondary | ICD-10-CM

## 2023-01-19 DIAGNOSIS — E860 Dehydration: Secondary | ICD-10-CM

## 2023-01-19 DIAGNOSIS — Z8616 Personal history of COVID-19: Secondary | ICD-10-CM | POA: Insufficient documentation

## 2023-01-19 DIAGNOSIS — O0990 Supervision of high risk pregnancy, unspecified, unspecified trimester: Secondary | ICD-10-CM

## 2023-01-19 LAB — URINALYSIS, ROUTINE W REFLEX MICROSCOPIC
Glucose, UA: NEGATIVE mg/dL
Ketones, ur: 80 mg/dL — AB
Nitrite: NEGATIVE
Protein, ur: 100 mg/dL — AB
Specific Gravity, Urine: 1.028 (ref 1.005–1.030)
Squamous Epithelial / HPF: >50 /HPF (ref 0–5)
WBC, UA: >50 WBC/hpf (ref 0–5)
pH: 5 (ref 5.0–8.0)

## 2023-01-19 LAB — COMPREHENSIVE METABOLIC PANEL
ALT: 10 U/L (ref 0–44)
AST: 15 U/L (ref 15–41)
Albumin: 3 g/dL — ABNORMAL LOW (ref 3.5–5.0)
Alkaline Phosphatase: 95 U/L (ref 38–126)
Anion gap: 11 (ref 5–15)
BUN: 8 mg/dL (ref 6–20)
CO2: 20 mmol/L — ABNORMAL LOW (ref 22–32)
Calcium: 8.6 mg/dL — ABNORMAL LOW (ref 8.9–10.3)
Chloride: 102 mmol/L (ref 98–111)
Creatinine, Ser: 0.63 mg/dL (ref 0.44–1.00)
GFR, Estimated: >60 mL/min (ref >60)
Glucose, Bld: 92 mg/dL (ref 70–99)
Potassium: 3 mmol/L — ABNORMAL LOW (ref 3.5–5.1)
Sodium: 133 mmol/L — ABNORMAL LOW (ref 135–145)
Total Bilirubin: 1.4 mg/dL — ABNORMAL HIGH (ref 0.3–1.2)
Total Protein: 7.4 g/dL (ref 6.5–8.1)

## 2023-01-19 LAB — CBC
HCT: 35.1 % — ABNORMAL LOW (ref 36.0–46.0)
Hemoglobin: 11.1 g/dL — ABNORMAL LOW (ref 12.0–15.0)
MCH: 25.9 pg — ABNORMAL LOW (ref 26.0–34.0)
MCHC: 31.6 g/dL (ref 30.0–36.0)
MCV: 82 fL (ref 80.0–100.0)
Platelets: 713 10*3/uL — ABNORMAL HIGH (ref 150–400)
RBC: 4.28 MIL/uL (ref 3.87–5.11)
RDW: 13.8 % (ref 11.5–15.5)
WBC: 14.2 10*3/uL — ABNORMAL HIGH (ref 4.0–10.5)
nRBC: 0 % (ref 0.0–0.2)

## 2023-01-19 MED ORDER — ONDANSETRON 4 MG PO TBDP: 4.0000 mg | ORAL_TABLET | Freq: Four times a day (QID) | ORAL | 3 refills | Status: DC | PRN

## 2023-01-19 MED ORDER — METOCLOPRAMIDE HCL 5 MG/ML IJ SOLN
10.0000 mg | Freq: Once | INTRAMUSCULAR | Status: AC
  Administered 2023-01-19: 10 mg via INTRAVENOUS
  Filled 2023-01-19: qty 2

## 2023-01-19 MED ORDER — POTASSIUM CHLORIDE 2 MEQ/ML IV SOLN: INTRAVENOUS | Status: DC

## 2023-01-19 MED ORDER — SODIUM CHLORIDE 0.9 % IV SOLN
25.0000 mg | Freq: Once | INTRAVENOUS | Status: AC
  Administered 2023-01-19: 25 mg via INTRAVENOUS
  Filled 2023-01-19: qty 1

## 2023-01-19 MED ORDER — METOCLOPRAMIDE HCL 10 MG PO TABS: 10.0000 mg | ORAL_TABLET | Freq: Four times a day (QID) | ORAL | 2 refills | Status: DC

## 2023-01-19 MED ORDER — FAMOTIDINE IN NACL 20-0.9 MG/50ML-% IV SOLN
20.0000 mg | Freq: Once | INTRAVENOUS | Status: AC
  Administered 2023-01-19: 20 mg via INTRAVENOUS
  Filled 2023-01-19: qty 50

## 2023-01-19 MED ORDER — POTASSIUM CHLORIDE CRYS ER 20 MEQ PO TBCR: 20.0000 meq | EXTENDED_RELEASE_TABLET | Freq: Two times a day (BID) | ORAL | 0 refills | Status: DC

## 2023-01-19 MED ORDER — LACTATED RINGERS IV BOLUS: 1000.0000 mL | Freq: Once | INTRAVENOUS | Status: DC

## 2023-01-19 MED ORDER — PROMETHAZINE HCL 25 MG PO TABS: 25.0000 mg | ORAL_TABLET | Freq: Four times a day (QID) | ORAL | 2 refills | Status: DC | PRN

## 2023-01-19 MED ORDER — LACTATED RINGERS IV BOLUS
1000.0000 mL | Freq: Once | INTRAVENOUS | Status: AC
  Administered 2023-01-19: 1000 mL via INTRAVENOUS

## 2023-01-19 MED ORDER — POTASSIUM CHLORIDE 10 MEQ/100ML IV SOLN
10.0000 meq | Freq: Once | INTRAVENOUS | Status: DC
  Filled 2023-01-19: qty 100

## 2023-01-19 MED ORDER — ONDANSETRON HCL 4 MG/2ML IJ SOLN
4.0000 mg | Freq: Once | INTRAMUSCULAR | Status: AC
  Administered 2023-01-19: 4 mg via INTRAVENOUS
  Filled 2023-01-19: qty 2

## 2023-01-19 NOTE — MAU Provider Note (Cosign Needed Addendum)
Chief Complaint:  Headache, Nausea, and Emesis   Event Date/Time   First Provider Initiated Contact with Patient 01/19/23 1800      HPI: Maleny Spearman is a 32 y.o. [id number] at [redacted]w[redacted]d who presents to maternity admissions reporting nausea, vomiting, and headache x 3 days.  She has not been able to keep anything down. She receives IV infusions twice weekly for hyperemesis and reports her usual n/v is worse the last few days.   She reports good fetal movement, denies abdominal cramping, LOF, vaginal bleeding, vaginal itching/burning, urinary symptoms, dizziness, or fever/chills.     HPI  Past Medical History: Past Medical History:  Diagnosis Date   Abnormal Pap smear of cervix    Acute prerenal failure due to hyperemesis and pyelnonephritis(HCC) 10/03/2022   Anemia in pregnancy 03/01/2020   S/p IV feraheme 6/18 and 02/04/2020  CBC Latest Ref Rng & Units 02/06/2020 01/19/2020 12/21/2019 WBC 4.0 - 10.5 K/uL 17.3(H) 13.8(H) 14.9(H) Hemoglobin 12.0 - 15.0 g/dL 7.2(Z) 8.3(L) 8.5(L) Hematocrit 36 - 46 % 32.7(L) 26.9(L) 26.7(L) Platelets 150 - 400 K/uL 483(H) 472(H) 559(H)     Depression    Depression    Elevated liver enzymes 10/13/2017   [ ]  rpt cmp with 28wk labs   Gallbladder sludge 08/22/2019   Herpes simplex 01/16/2013   History of 15wk loss (2020) 02/06/2019   [ ]  f/u antiphosphlipid abs future lab (placed 4/22)  Less than 20 weeks   History of abnormal cervical Pap smear 10/09/2017   2020: pap smear wnl   History of COVID-19 09/10/2019   COVID+ 09/08/19 symptomatic    History of herpes genitalis 10/09/2022   History of IUFD 02/06/2019   Less than 20 weeks   History of renal insufficiency 12/02/2022   Due to pyelo and dehydration but need to trend Cr   Hyperemesis gravidarum with electrolyte imbalance 10/03/2022   Twice weekly infusions-- 1L LR, Phenergan, Zofran, Protonix for 15 weeks of therapy written by Dr Alvester Morin 2.26   Will get CMP x 4 weekly      [ ]  oral potassium?   Migraine  headache 11/24/2022   Sees neurologist for migraines who manages Tourette's   Migraine without aura and without status migrainosus, not intractable 05/10/2022   Pancreatitis, acute 08/20/2019   Seasonal allergies 10/09/2017   Thrombocytosis 08/16/2019   CBC  Latest Ref Rng & Units  02/06/2020  01/19/2020  12/21/2019  WBC  4.0 - 10.5 K/uL  17.3(H)  13.8(H)  14.9(H)  Hemoglobin  12.0 - 15.0 g/dL  3.6(U)  8.3(L)  8.5(L)  Hematocrit  36 - 46 %  32.7(L)  26.9(L)  26.7(L)  Platelets  150 - 400 K/uL  483(H)  472(H)  559(H)         Tourette's syndrome    Trichomoniasis     Past obstetric history: OB History  Gravida Para Term Preterm AB Living  4 2 2   1 2   SAB IAB Ectopic Multiple Live Births  1     0 2    # Outcome Date GA Lbr Len/2nd Weight Sex Delivery Anes PTL Lv  4 Current           3 Term 03/01/20 [redacted]w[redacted]d 03:23 / 00:15 3810 g F Vag-Spont EPI  LIV     Birth Comments: WDL   2 SAB 01/2019 [redacted]w[redacted]d         1 Term 03/08/13 [redacted]w[redacted]d  2495 g  Vag-Spont EPI  LIV     Birth Comments: no  complications    Past Surgical History: Past Surgical History:  Procedure Laterality Date   COLPOSCOPY      Family History: Family History  Problem Relation Age of Onset   Diabetes Mother     Social History: Social History   Tobacco Use   Smoking status: Never   Smokeless tobacco: Never  Vaping Use   Vaping Use: Never used  Substance Use Topics   Alcohol use: Not Currently    Comment: occasional   Drug use: Not Currently    Allergies: No Known Allergies  Meds:  Medications Prior to Admission  Medication Sig Dispense Refill Last Dose   aspirin EC 81 MG tablet Take 1 tablet (81 mg total) by mouth daily. Swallow whole. 30 tablet 12 01/18/2023   metoCLOPramide (REGLAN) 5 MG tablet Take 5 mg by mouth 4 (four) times daily.   Past Week   prenatal vitamin w/FE, FA (PRENATAL 1 + 1) 27-1 MG TABS tablet Take 1 tablet by mouth daily at 12 noon. 30 tablet 11 Past Week   [DISCONTINUED] ondansetron (ZOFRAN-ODT) 4  MG disintegrating tablet Take 1 tablet (4 mg total) by mouth every 6 (six) hours as needed for nausea or vomiting. 30 tablet 3 Past Month   acetaminophen (TYLENOL) 325 MG tablet Take 650 mg by mouth every 6 (six) hours as needed for mild pain or headache. (Patient not taking: Reported on 01/08/2023)      acetaminophen-caffeine (EXCEDRIN TENSION HEADACHE) 500-65 MG TABS per tablet Take 1 tablet by mouth every 6 (six) hours as needed (Headache). (Patient not taking: Reported on 01/08/2023) 60 tablet 0    Blood Pressure Monitoring DEVI 1 each by Does not apply route once a week. 1 each 0    famotidine (PEPCID) 20 MG tablet Take 1 tablet (20 mg total) by mouth 2 (two) times daily. 60 tablet 2    scopolamine (TRANSDERM-SCOP) 1 MG/3DAYS PLACE 1 PATCH ONTO THE SKIN EVERY 3 DAYS.   01/17/2023   [DISCONTINUED] promethazine (PHENERGAN) 25 MG tablet Take 1 tablet (25 mg total) by mouth every 6 (six) hours as needed for nausea, vomiting or refractory nausea / vomiting. (Patient not taking: Reported on 01/13/2023) 30 tablet 2     ROS:  Review of Systems  Constitutional:  Negative for chills, fatigue and fever.  Eyes:  Negative for visual disturbance.  Respiratory:  Negative for cough and shortness of breath.   Cardiovascular:  Negative for chest pain.  Gastrointestinal:  Positive for nausea and vomiting. Negative for abdominal pain.  Genitourinary:  Negative for difficulty urinating, dysuria, flank pain, frequency, pelvic pain, urgency, vaginal bleeding, vaginal discharge and vaginal pain.  Musculoskeletal: Negative.   Neurological:  Positive for headaches. Negative for dizziness.  Psychiatric/Behavioral: Negative.       I have reviewed patient's Past Medical Hx, Surgical Hx, Family Hx, Social Hx, medications and allergies.   Physical Exam  Patient Vitals for the past 24 hrs:  BP Temp Temp src Pulse Resp SpO2 Height Weight  01/19/23 1716 94/73 -- -- (!) 124 18 -- -- --  01/19/23 1657 110/69 98.3 F (36.8  C) Oral (!) 121 19 98 % -- --  01/19/23 1652 -- -- -- -- -- -- 5' (1.524 m) 65.5 kg   Constitutional: Well-developed, well-nourished female in no acute distress.  Cardiovascular: normal rate Respiratory: normal effort GI: Abd soft, non-tender, gravid appropriate for gestational age.  MS: Extremities nontender, no edema, normal ROM Neurologic: Alert and oriented x 4.  GU: Neg CVAT.  PELVIC EXAM: Deferred    FHT:  Baseline 150 , moderate variability, accelerations present, occasional varable decelerations Contractions: none on toco or to palpation   Labs: Results for orders placed or performed during the hospital encounter of 01/19/23 (from the past 24 hour(s))  Urinalysis, Routine w reflex microscopic -Urine, Clean Catch     Status: Abnormal   Collection Time: 01/19/23  5:09 PM  Result Value Ref Range   Color, Urine AMBER (A) YELLOW   APPearance CLOUDY (A) CLEAR   Specific Gravity, Urine 1.028 1.005 - 1.030   pH 5.0 5.0 - 8.0   Glucose, UA NEGATIVE NEGATIVE mg/dL   Hgb urine dipstick SMALL (A) NEGATIVE   Bilirubin Urine MODERATE (A) NEGATIVE   Ketones, ur 80 (A) NEGATIVE mg/dL   Protein, ur 161 (A) NEGATIVE mg/dL   Nitrite NEGATIVE NEGATIVE   Leukocytes,Ua MODERATE (A) NEGATIVE   RBC / HPF 11-20 0 - 5 RBC/hpf   WBC, UA >50 0 - 5 WBC/hpf   Bacteria, UA MANY (A) NONE SEEN   Squamous Epithelial / HPF >50 0 - 5 /HPF   WBC Clumps PRESENT    Mucus PRESENT   CBC     Status: Abnormal   Collection Time: 01/19/23  6:21 PM  Result Value Ref Range   WBC 14.2 (H) 4.0 - 10.5 K/uL   RBC 4.28 3.87 - 5.11 MIL/uL   Hemoglobin 11.1 (L) 12.0 - 15.0 g/dL   HCT 09.6 (L) 04.5 - 40.9 %   MCV 82.0 80.0 - 100.0 fL   MCH 25.9 (L) 26.0 - 34.0 pg   MCHC 31.6 30.0 - 36.0 g/dL   RDW 81.1 91.4 - 78.2 %   Platelets 713 (H) 150 - 400 K/uL   nRBC 0.0 0.0 - 0.2 %  Comprehensive metabolic panel     Status: Abnormal   Collection Time: 01/19/23  6:21 PM  Result Value Ref Range   Sodium 133 (L) 135 -  145 mmol/L   Potassium 3.0 (L) 3.5 - 5.1 mmol/L   Chloride 102 98 - 111 mmol/L   CO2 20 (L) 22 - 32 mmol/L   Glucose, Bld 92 70 - 99 mg/dL   BUN 8 6 - 20 mg/dL   Creatinine, Ser 9.56 0.44 - 1.00 mg/dL   Calcium 8.6 (L) 8.9 - 10.3 mg/dL   Total Protein 7.4 6.5 - 8.1 g/dL   Albumin 3.0 (L) 3.5 - 5.0 g/dL   AST 15 15 - 41 U/L   ALT 10 0 - 44 U/L   Alkaline Phosphatase 95 38 - 126 U/L   Total Bilirubin 1.4 (H) 0.3 - 1.2 mg/dL   GFR, Estimated >21 >30 mL/min   Anion gap 11 5 - 15   O/Positive/-- (02/28 1044)  Imaging:   MAU Course/MDM: Orders Placed This Encounter  Procedures   Urinalysis, Routine w reflex microscopic -Urine, Clean Catch   CBC   Comprehensive metabolic panel    Meds ordered this encounter  Medications   lactated ringers bolus 1,000 mL   promethazine (PHENERGAN) 25 mg in sodium chloride 0.9 % 1,000 mL infusion   famotidine (PEPCID) IVPB 20 mg premix   ondansetron (ZOFRAN) injection 4 mg   metoCLOPramide (REGLAN) injection 10 mg   DISCONTD: lactated ringers 1,000 mL with potassium chloride 10 mEq infusion   DISCONTD: potassium chloride 10 mEq in 100 mL IVPB   lactated ringers bolus 1,000 mL   potassium chloride SA (KLOR-CON M) 20 MEQ tablet    Sig:  Take 1 tablet (20 mEq total) by mouth 2 (two) times daily for 3 days.    Dispense:  6 tablet    Refill:  0    Order Specific Question:   Supervising Provider    Answer:   Lennart Pall [[id number]]   promethazine (PHENERGAN) 25 MG tablet    Sig: Take 1 tablet (25 mg total) by mouth every 6 (six) hours as needed for nausea, vomiting or refractory nausea / vomiting.    Dispense:  30 tablet    Refill:  2    Order Specific Question:   Supervising Provider    Answer:   Lennart Pall [[id number]]   ondansetron (ZOFRAN-ODT) 4 MG disintegrating tablet    Sig: Take 1 tablet (4 mg total) by mouth every 6 (six) hours as needed for nausea or vomiting.    Dispense:  30 tablet    Refill:  3    Order Specific  Question:   Supervising Provider    Answer:   Lennart Pall [[id number]]   metoCLOPramide (REGLAN) 10 MG tablet    Sig: Take 1 tablet (10 mg total) by mouth 4 (four) times daily.    Dispense:  30 tablet    Refill:  2    Order Specific Question:   Supervising Provider    Answer:   Lennart Pall [[id number]]     NST reviewed and appropriate for gestational age IV fluids, antiemetics given, Reglan given for nausea and h/a Zofran 4 mg IV given.  Pt nausea and h/a improved. Hypokalemia with potassium 3.0. Pt tolerating PO in MAU, D/C home with K-dur 20 meq BID x 3 days. Message sent to reestablish IV infusions this week which were cancelled Return to MAU as needed  Keep scheduled appts in the office      Sharen Counter Certified Nurse-Midwife 01/19/2023 8:57 PM

## 2023-01-19 NOTE — MAU Note (Signed)
Jody Robinson is a 32 y.o. at [redacted]w[redacted]d here in MAU reporting: she's had a HA for the past 3 days and hasn't been able to keep anything down.  Reports took Tylenol yesterday, no relief noted.  States currently wearing  patch for N/V, but needs refill for other meds. Denies VB or LOF.  Endorses +FM. LMP: NA Onset of complaint: 3 days Pain score: 8 Vitals:   01/19/23 1657  BP: 110/69  Pulse: (!) 121  Resp: 19  Temp: 98.3 F (36.8 C)  SpO2: 98%     FHT: 155 bpm Lab orders placed from triage:   UA

## 2023-01-20 ENCOUNTER — Non-Acute Institutional Stay (HOSPITAL_COMMUNITY)
Admission: RE | Admit: 2023-01-20 | Discharge: 2023-01-20 | Disposition: A | Discharge status: Home / Self Care | Payer: 59 | Source: Ambulatory Visit | Attending: Internal Medicine | Admitting: Internal Medicine

## 2023-01-20 DIAGNOSIS — Z3A00 Weeks of gestation of pregnancy not specified: Secondary | ICD-10-CM | POA: Diagnosis not present

## 2023-01-20 DIAGNOSIS — O211 Hyperemesis gravidarum with metabolic disturbance: Secondary | ICD-10-CM | POA: Insufficient documentation

## 2023-01-20 MED ORDER — SODIUM CHLORIDE 0.9 % IV SOLN
8.0000 mg | Freq: Once | INTRAVENOUS | Status: AC
  Administered 2023-01-20: 8 mg via INTRAVENOUS
  Filled 2023-01-20: qty 4

## 2023-01-20 MED ORDER — SODIUM CHLORIDE 0.9 % IV SOLN
12.5000 mg | Freq: Once | INTRAVENOUS | Status: AC
  Administered 2023-01-20: 12.5 mg via INTRAVENOUS
  Filled 2023-01-20: qty 12.5

## 2023-01-20 MED ORDER — SODIUM CHLORIDE 0.9 % IV SOLN: INTRAVENOUS | Status: DC | PRN

## 2023-01-20 MED ORDER — LACTATED RINGERS IV BOLUS
1000.0000 mL | Freq: Once | INTRAVENOUS | Status: AC
  Administered 2023-01-20: 1000 mL via INTRAVENOUS

## 2023-01-20 MED ORDER — PANTOPRAZOLE SODIUM 40 MG IV SOLR
40.0000 mg | Freq: Once | INTRAVENOUS | Status: AC
  Administered 2023-01-20: 40 mg via INTRAVENOUS
  Filled 2023-01-20: qty 10

## 2023-01-20 NOTE — Progress Notes (Signed)
PATIENT CARE CENTER NOTE  Diagnosis: Hyperemesis gravidarum with electrolyte imbalance [O21.1]      Provider: Lyndel Safe MD   Procedure: Fluid bolus and anti-emetics (#23 of 30)   Note: Patient received 1 Litre Lactated Ringers fluid bolus over 1 hour, IVPB Phenergan 12.5 mg, IVPB Zofran 8 mg, and IV Protonix 40 mg via PIV. Pt tolerated infusion and medications with no reaction. Vital signs stable. Patient can come twice a week for a total of 30 doses. Pt advised to schedule next appointment at front desk. Pt is alert, oriented, and ambulatory at discharge.

## 2023-01-24 ENCOUNTER — Encounter (HOSPITAL_COMMUNITY)
Admission: RE | Admit: 2023-01-24 | Discharge: 2023-01-24 | Disposition: A | Discharge status: Home / Self Care | Payer: 59 | Source: Ambulatory Visit | Attending: Hematology and Oncology | Admitting: Hematology and Oncology

## 2023-01-24 DIAGNOSIS — D75839 Thrombocytosis, unspecified: Secondary | ICD-10-CM | POA: Diagnosis not present

## 2023-01-24 MED ORDER — SODIUM CHLORIDE 0.9 % IV SOLN
510.0000 mg | Freq: Once | INTRAVENOUS | Status: AC
  Administered 2023-01-24: 510 mg via INTRAVENOUS
  Filled 2023-01-24: qty 510

## 2023-01-27 ENCOUNTER — Other Ambulatory Visit: Payer: Self-pay

## 2023-01-27 DIAGNOSIS — O0990 Supervision of high risk pregnancy, unspecified, unspecified trimester: Secondary | ICD-10-CM

## 2023-01-28 ENCOUNTER — Non-Acute Institutional Stay (HOSPITAL_COMMUNITY)
Admission: RE | Admit: 2023-01-28 | Discharge: 2023-01-28 | Disposition: A | Discharge status: Home / Self Care | Payer: 59 | Source: Ambulatory Visit | Attending: Internal Medicine | Admitting: Internal Medicine

## 2023-01-28 ENCOUNTER — Other Ambulatory Visit: Payer: Self-pay

## 2023-01-28 ENCOUNTER — Other Ambulatory Visit: Payer: 59

## 2023-01-28 ENCOUNTER — Ambulatory Visit (INDEPENDENT_AMBULATORY_CARE_PROVIDER_SITE_OTHER): Payer: 59 | Admitting: Obstetrics & Gynecology

## 2023-01-28 VITALS — BP 121/78 | HR 92 | Wt 147.5 lb

## 2023-01-28 DIAGNOSIS — O2612 Low weight gain in pregnancy, second trimester: Secondary | ICD-10-CM

## 2023-01-28 DIAGNOSIS — O99282 Endocrine, nutritional and metabolic diseases complicating pregnancy, second trimester: Secondary | ICD-10-CM

## 2023-01-28 DIAGNOSIS — O2613 Low weight gain in pregnancy, third trimester: Secondary | ICD-10-CM

## 2023-01-28 DIAGNOSIS — Z23 Encounter for immunization: Secondary | ICD-10-CM | POA: Diagnosis not present

## 2023-01-28 DIAGNOSIS — O0990 Supervision of high risk pregnancy, unspecified, unspecified trimester: Secondary | ICD-10-CM

## 2023-01-28 DIAGNOSIS — E0590 Thyrotoxicosis, unspecified without thyrotoxic crisis or storm: Secondary | ICD-10-CM

## 2023-01-28 DIAGNOSIS — Z3A27 27 weeks gestation of pregnancy: Secondary | ICD-10-CM

## 2023-01-28 DIAGNOSIS — O0992 Supervision of high risk pregnancy, unspecified, second trimester: Secondary | ICD-10-CM

## 2023-01-28 DIAGNOSIS — O211 Hyperemesis gravidarum with metabolic disturbance: Secondary | ICD-10-CM | POA: Insufficient documentation

## 2023-01-28 MED ORDER — PANTOPRAZOLE SODIUM 40 MG IV SOLR
40.0000 mg | Freq: Once | INTRAVENOUS | Status: AC
  Administered 2023-01-28: 40 mg via INTRAVENOUS
  Filled 2023-01-28: qty 10

## 2023-01-28 MED ORDER — SODIUM CHLORIDE 0.9 % IV SOLN
8.0000 mg | Freq: Once | INTRAVENOUS | Status: AC
  Administered 2023-01-28: 8 mg via INTRAVENOUS
  Filled 2023-01-28: qty 4

## 2023-01-28 MED ORDER — SODIUM CHLORIDE 0.9 % IV SOLN
12.5000 mg | Freq: Once | INTRAVENOUS | Status: AC
  Administered 2023-01-28: 12.5 mg via INTRAVENOUS
  Filled 2023-01-28: qty 12.5

## 2023-01-28 MED ORDER — LACTATED RINGERS IV BOLUS
1000.0000 mL | Freq: Once | INTRAVENOUS | Status: AC
  Administered 2023-01-28: 1000 mL via INTRAVENOUS

## 2023-01-28 MED ORDER — SODIUM CHLORIDE 0.9 % IV SOLN: INTRAVENOUS | Status: DC | PRN

## 2023-01-28 NOTE — Progress Notes (Signed)
Patient here for routine prenatal visit   Tdap vaccine given in right deltoid. No questions or concerns

## 2023-01-28 NOTE — Progress Notes (Signed)
PATIENT CARE CENTER NOTE  Diagnosis: Hyperemesis gravidarum with electrolyte imbalance [O21.1]      Provider: Lyndel Safe MD  Procedure: Fluid Bolus and anti-emetics (#24 of 30)  Note: Patient received 1 Litre Lactated Ringers fluid bolus over 1 hour, IVPB Phenergan 12.5 mg, IVPB Zofran 8 mg, and IV Protonix via PIV. Patient tolerated infusion and medications with no reaction. Vital signs are stable. Pt can come twice a week for a total of 30 doses. Pt advised to schedule next appointment at front desk. Pt is alert, oriented, and ambulatory at discharge.

## 2023-01-28 NOTE — Progress Notes (Signed)
   PRENATAL VISIT NOTE  Subjective:  Jody Robinson is a 32 y.o. [id number] at [redacted]w[redacted]d being seen today for ongoing prenatal care.  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  Patient reports  poor weight gain .  Contractions: Irritability. Vag. Bleeding: None.  Movement: Present. Denies leaking of fluid.   The following portions of the patient's history were reviewed and updated as appropriate: allergies, current medications, past family history, past medical history, past social history, past surgical history and problem list.   Objective:   Vitals:   01/28/23 0929  BP: 121/78  Pulse: 92  Weight: 147 lb 8 oz (66.9 kg)    Fetal Status: Fetal Heart Rate (bpm): 146   Movement: Present     General:  Alert, oriented and cooperative. Patient is in no acute distress.  Skin: Skin is warm and dry. No rash noted.   Cardiovascular: Normal heart rate noted  Respiratory: Normal respiratory effort, no problems with respiration noted  Abdomen: Soft, gravid, appropriate for gestational age.  Pain/Pressure: Absent     Pelvic: Cervical exam deferred        Extremities: Normal range of motion.     Mental Status: Normal mood and affect. Normal behavior. Normal judgment and thought content.   Assessment and Plan:  Pregnancy: [id number] at [redacted]w[redacted]d 1. Supervision of high risk pregnancy, antepartum 28 week labs - Tdap vaccine greater than or equal to 7yo IM  2. Hyperthyroidism affecting pregnancy in second trimester Normal labs last check   3. Poor weight gain of pregnancy, third trimester Nutrition consult - Ambulatory referral to Nutrition and Diabetic Education  Preterm labor symptoms and general obstetric  precautions including but not limited to vaginal bleeding, contractions, leaking of fluid and fetal movement were reviewed in detail with the patient. Please refer to After Visit Summary for other counseling recommendations.   Return in about 3 weeks (around 02/18/2023).  Future Appointments  Date Time Provider Department Center  01/30/2023 11:30 AM [facility] RM 1 [facility] None  02/06/2023  9:45 AM [facility] None  02/11/2023  1:00 PM Altamese Niles, MD [facility] None  02/20/2023 11:15 AM [facility] None  02/20/2023 11:40 AM Jaci Standard, MD [facility] None    Scheryl Darter, MD

## 2023-01-28 NOTE — Progress Notes (Deleted)
1475

## 2023-01-29 ENCOUNTER — Inpatient Hospital Stay (HOSPITAL_COMMUNITY): Admission: RE | Admit: 2023-01-29 | Payer: 59 | Source: Ambulatory Visit

## 2023-01-29 ENCOUNTER — Encounter: Payer: Self-pay | Admitting: Skilled Nursing Facility1

## 2023-01-29 ENCOUNTER — Encounter: Payer: 59 | Attending: Obstetrics & Gynecology | Admitting: Skilled Nursing Facility1

## 2023-01-29 VITALS — Ht 60.0 in | Wt 150.3 lb

## 2023-01-29 DIAGNOSIS — O2613 Low weight gain in pregnancy, third trimester: Secondary | ICD-10-CM | POA: Insufficient documentation

## 2023-01-29 LAB — GLUCOSE TOLERANCE, 2 HOURS W/ 1HR
Glucose, 1 hour: 148 mg/dL (ref 70–179)
Glucose, 2 hour: 116 mg/dL (ref 70–152)
Glucose, Fasting: 85 mg/dL (ref 70–91)

## 2023-01-29 LAB — CBC
Hematocrit: 32.6 % — ABNORMAL LOW (ref 34.0–46.6)
Hemoglobin: 10.2 g/dL — ABNORMAL LOW (ref 11.1–15.9)
MCH: 26.5 pg — ABNORMAL LOW (ref 26.6–33.0)
MCHC: 31.3 g/dL — ABNORMAL LOW (ref 31.5–35.7)
MCV: 85 fL (ref 79–97)
Platelets: 572 10*3/uL — ABNORMAL HIGH (ref 150–450)
RBC: 3.85 x10E6/uL (ref 3.77–5.28)
RDW: 14.3 % (ref 11.7–15.4)
WBC: 13.4 10*3/uL — ABNORMAL HIGH (ref 3.4–10.8)

## 2023-01-29 LAB — HIV ANTIBODY (ROUTINE TESTING W REFLEX): HIV Screen 4th Generation wRfx: NONREACTIVE

## 2023-01-29 LAB — RPR: RPR Ser Ql: NONREACTIVE

## 2023-01-29 NOTE — Progress Notes (Signed)
Medical Nutrition Therapy  Appointment Start time:  9:37  Appointment End time:  10:20  Primary concerns today: slow weight gain in pregnancy  Referral diagnosis: 026.13  NUTRITION ASSESSMENT   Clinical Medical Hx: depression, anemia, kidney disease  Medications: see list Labs: WBC 13.4, hemoglobin 10.2, hematocrit 32.6, MCH 26.5 Notable Signs/Symptoms: nausea   Lifestyle & Dietary Hx  Pt states prepregnancy she was 165-170 pounds. Pt states she started losing weight at the beginning of her second trimester which is when she was having hyperemesis which has subsided since May stating she now vomits about 3 times a week due to nausea starting in the morning stating she gets IV infusions for the nausea which works.  Pt states she takes a prenatal sometimes.  Pt states she needed iron last week twice which make her sick.  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    Pt states her step dad cooks for her.   Estimated daily fluid intake: unknown oz Supplements: sometimes prenatal Sleep: 6-8 hours with rest  Stress / self-care: fairly controlled Current average weekly physical activity:   24-Hr Dietary Recall: wakes at 4:30am; eating out 2 times a week First Meal 5:30-5:45am: cheese grits or biscuits (skipping 4 days a week but does have ensure) and always breakfast with an ensure Snack: sometimes 2 days a week pack of peanut butter crackers  Second Meal: corn dog + mustard or ketchup or [place] and cheese sandwich + juice (skipping 4 days a week sometimes will have an ensure) Snack:  Third Meal: steak or chicken + mashed potatoes or corn  Snack: 1-2am [place] and cheese sandwich or peanut butter sandwich twice a week Beverages: apple juice, water, orange juice, gingerale, 2% milk   NUTRITION INTERVENTION  Nutrition education (E-1) on the following topics:  Creation of balanced and diverse meals to increase the intake of nutrient-rich  foods that provide essential vitamins, minerals, fiber, and phytonutrients  Variety of Fruits and Vegetables:  Aim for a colorful array of fruits and vegetables to ensure a wide range of nutrients. Include a mix of leafy greens, berries, citrus fruits, cruciferous vegetables, and more. Whole Grains: Choose whole grains over refined grains. Examples include brown rice, quinoa, oats, whole wheat, and barley. Lean Proteins: Include lean sources of protein, such as poultry, fish, tofu, legumes, beans, lentils, and low-fat dairy products. Limit red and processed meats. Healthy Fats: Incorporate sources of healthy fats, including avocados, nuts, seeds, and olive oil. Limit saturated and trans fats found in fried and processed foods. Dairy or Dairy Alternatives: Choose low-fat or fat-free dairy products, or plant-based alternatives like almond or soy milk. Portion Control: Be mindful of portion sizes to avoid overeating. Pay attention to hunger and satisfaction cues. Limit Added Sugars: Minimize the consumption of sugary beverages, snacks, and desserts. Check food labels for added sugars and opt for natural sources of sweetness such as whole fruits. Hydration: Drink plenty of water throughout the day. Limit sugary drinks and excessive caffeine intake. Moderate Sodium Intake: Reduce the consumption of high-sodium foods. Use herbs and spices for flavor instead of excessive salt. Meal Planning and Preparation: Plan and prepare meals ahead of time to make healthier choices more convenient. Include a mix of food groups in each meal. Limit Processed Foods: Minimize the intake of highly processed and packaged foods that are often high in added sugars, salt, and unhealthy fats. Regular Physical Activity: Combine a healthy diet with regular  physical activity for overall well-being. Aim for at least 150 minutes of moderate-intensity aerobic exercise per week, along with strength training. Moderation  and Balance: Enjoy treats and indulgent foods in moderation, emphasizing balance rather than strict restriction.  Handouts Provided Include  Detailed MyPlate  Learning Style & Readiness for Change Teaching method utilized: Visual & Auditory  Demonstrated degree of understanding via: Teach Back  Barriers to learning/adherence to lifestyle change: nausea   Goals Established by Pt Take your prenatal right after having dinner Drink 1 8 ounce glass of milk per day Have at least 1 yogurt every day Cut back to only 1 glass of juice per day Be sure to drink at least  Be sure to have at least 2 servings of fruit per day and 2 servings of non starchy vegetables per day Try brown rice Do not skip meals    MONITORING & EVALUATION Dietary intake, weekly physical activity  Next Steps  Patient is to follow up in 2-3 weeks.

## 2023-01-30 ENCOUNTER — Encounter (HOSPITAL_COMMUNITY): Payer: 59

## 2023-02-03 ENCOUNTER — Non-Acute Institutional Stay (HOSPITAL_COMMUNITY)
Admission: RE | Admit: 2023-02-03 | Discharge: 2023-02-03 | Disposition: A | Discharge status: Home / Self Care | Payer: 59 | Source: Ambulatory Visit | Attending: Internal Medicine | Admitting: Internal Medicine

## 2023-02-03 DIAGNOSIS — O211 Hyperemesis gravidarum with metabolic disturbance: Secondary | ICD-10-CM | POA: Insufficient documentation

## 2023-02-03 MED ORDER — SODIUM CHLORIDE 0.9 % IV SOLN
8.0000 mg | Freq: Once | INTRAVENOUS | Status: DC
  Filled 2023-02-03: qty 4

## 2023-02-03 MED ORDER — PANTOPRAZOLE SODIUM 40 MG IV SOLR
40.0000 mg | Freq: Once | INTRAVENOUS | Status: AC
  Administered 2023-02-03: 40 mg via INTRAVENOUS
  Filled 2023-02-03: qty 10

## 2023-02-03 MED ORDER — LACTATED RINGERS IV BOLUS
1000.0000 mL | Freq: Once | INTRAVENOUS | Status: AC
  Administered 2023-02-03: 1000 mL via INTRAVENOUS

## 2023-02-03 MED ORDER — SODIUM CHLORIDE 0.9 % IV SOLN
12.5000 mg | Freq: Once | INTRAVENOUS | Status: AC
  Administered 2023-02-03: 12.5 mg via INTRAVENOUS
  Filled 2023-02-03: qty 12.5

## 2023-02-03 MED ORDER — SODIUM CHLORIDE 0.9 % IV SOLN: INTRAVENOUS | Status: DC | PRN

## 2023-02-03 MED ORDER — ONDANSETRON 8 MG/NS 50 ML IVPB
8.0000 mg | Freq: Once | INTRAVENOUS | Status: AC
  Administered 2023-02-03: 8 mg via INTRAVENOUS
  Filled 2023-02-03: qty 8

## 2023-02-03 NOTE — Progress Notes (Signed)
PATIENT CARE CENTER NOTE  Diagnosis: Hyperemesis gravidarum with electrolyte imbalance [O21.1]      Provider: Lyndel Safe MD  Procedure: Fluid Bolus and Anti-emetics (dose #25 of 30)   Note: Patient received 1 Litre Lactated Ringers fluid bolus over 1 hour, IVPB Phenergan 12.5 mg, IVPB Zofran 8 mg, and IV Protonix 40 mg via PIV. Patient tolerated infusion and medication with no adverse reaction. Vital signs are stable. Pt can come twice a week for a total of 30 doses. Pt advised to schedule next appointment at front desk. AVS offered, but pt declined. Pt is alert, oriented, and ambulatory at discharge.

## 2023-02-06 ENCOUNTER — Other Ambulatory Visit (INDEPENDENT_AMBULATORY_CARE_PROVIDER_SITE_OTHER): Payer: 59

## 2023-02-06 DIAGNOSIS — Z8639 Personal history of other endocrine, nutritional and metabolic disease: Secondary | ICD-10-CM | POA: Diagnosis not present

## 2023-02-06 LAB — T4, FREE: Free T4: 0.78 ng/dL (ref 0.60–1.60)

## 2023-02-06 LAB — T3, FREE: T3, Free: 3.5 pg/mL (ref 2.3–4.2)

## 2023-02-06 LAB — TSH: TSH: 0.65 u[IU]/mL (ref 0.35–5.50)

## 2023-02-11 ENCOUNTER — Ambulatory Visit (INDEPENDENT_AMBULATORY_CARE_PROVIDER_SITE_OTHER): Payer: 59 | Admitting: "Endocrinology

## 2023-02-11 ENCOUNTER — Encounter: Payer: Self-pay | Admitting: "Endocrinology

## 2023-02-11 VITALS — BP 115/70 | HR 88 | Ht 60.0 in | Wt 146.4 lb

## 2023-02-11 DIAGNOSIS — Z8639 Personal history of other endocrine, nutritional and metabolic disease: Secondary | ICD-10-CM | POA: Diagnosis not present

## 2023-02-11 DIAGNOSIS — Z3A29 29 weeks gestation of pregnancy: Secondary | ICD-10-CM | POA: Diagnosis not present

## 2023-02-11 NOTE — Progress Notes (Signed)
Outpatient Endocrinology Note Jody Callaway, MD  02/11/23   Jody Robinson [birth year] [id number]  Referring Provider: Chicora Bing, MD Primary Care Provider: Pcp, No Subjective  Chief Complaint  Patient presents with   Hyperthyroidism    [redacted] wks pregnant    Assessment & Plan  Jody Robinson was seen today for hyperthyroidism.  Diagnoses and all orders for this visit:  Personal history of hyperthyroidism  [redacted] weeks gestation of pregnancy     Jody Robinson is currently taking no thyroid medication. [redacted] week pregnant with 4th pregnancy, history of 2 normal and 1 miscarriage  Patient currently clinically and biochemically euthyroid.  TSH low and with high FT4 and normal FT3 in 09/2022. Normal TSH, FT4 and FT3 on 12/11/22 and 02/06/2023. No medication needed at this point.  Educated on thyroid axis.    The patient understood the recommendations and agrees with the treatment plan. All questions regarding treatment plan were fully answered.   No follow-ups on file. Follow with Ob-gyn and pcp  Jody Lindisfarne, MD  02/11/23   I have reviewed current medications, nurse's notes, allergies, vital signs, past medical and surgical history, family medical history, and social history for this encounter. Counseled patient on symptoms, examination findings, lab findings, imaging results, treatment decisions and monitoring and prognosis. The patient understood the recommendations and agrees with the treatment plan. All questions regarding treatment plan were fully answered.   History of Present Illness Jody Robinson is a 32 y.o. year old female who presents to our clinic with low TSH in pregnancy.   Pt is [redacted] weeks pregnant with her third baby (has one miscarriage).  EDD 04/28/23  Never been on any thyroid medication.  Symptoms suggestive of HYPERTHYROIDISM:  weight loss  +  heat intolerance + hyperdefecation  - palpitations  -  Compressive symptoms:  dysphagia   - dysphonia  - positional dyspnea (especially with simultaneous arms elevation)  -  Smokes  - On biotin  - Personal history of head/neck surgery/irradiation  -  Physical Exam  BP 115/70   Pulse 88   Ht 5' (1.524 m)   Wt 146 lb 6.4 oz (66.4 kg)   LMP 07/22/2022 (Exact Date)   SpO2 99%   BMI 28.59 kg/m  Constitutional: well developed, well nourished Head: normocephalic, atraumatic, no exophthalmos Eyes: sclera anicteric, no redness Neck: no thyromegaly, no thyroid tenderness; no nodules palpated Lungs: normal respiratory effort Neurology: alert and oriented, no fine hand tremor Skin: dry, no appreciable rashes Musculoskeletal: no appreciable defects Psychiatric: normal mood and affect  Allergies No Known Allergies  Current Medications Patient's Medications  New Prescriptions   No medications on file  Previous Medications   ACETAMINOPHEN (TYLENOL) 325 MG TABLET    Take 650 mg by mouth every 6 (six) hours as needed for mild pain or headache.   ACETAMINOPHEN-CAFFEINE (EXCEDRIN TENSION HEADACHE) 500-65 MG TABS PER TABLET    Take 1 tablet by mouth every 6 (six) hours as needed (Headache).   ASPIRIN EC 81 MG TABLET    Take 1 tablet (81 mg total) by mouth daily. Swallow whole.   BLOOD PRESSURE MONITORING DEVI    1 each by Does not apply route once a week.   FAMOTIDINE (PEPCID) 20 MG TABLET    Take 1 tablet (20 mg total) by mouth 2 (two) times daily.   METOCLOPRAMIDE (REGLAN) 10 MG TABLET    Take 1 tablet (10 mg total) by mouth 4 (four) times daily.   ONDANSETRON (ZOFRAN-ODT) 4 MG  DISINTEGRATING TABLET    Take 1 tablet (4 mg total) by mouth every 6 (six) hours as needed for nausea or vomiting.   POTASSIUM CHLORIDE SA (KLOR-CON M) 20 MEQ TABLET    Take 1 tablet (20 mEq total) by mouth 2 (two) times daily for 3 days.   PRENATAL VITAMIN W/FE, FA (PRENATAL 1 + 1) 27-1 MG TABS TABLET    Take 1 tablet by mouth daily at 12 noon.   PROMETHAZINE (PHENERGAN) 25 MG TABLET    Take 1 tablet  (25 mg total) by mouth every 6 (six) hours as needed for nausea, vomiting or refractory nausea / vomiting.   SCOPOLAMINE (TRANSDERM-SCOP) 1 MG/3DAYS    PLACE 1 PATCH ONTO THE SKIN EVERY 3 DAYS.  Modified Medications   No medications on file  Discontinued Medications   No medications on file    Past Medical History Past Medical History:  Diagnosis Date   Abnormal Pap smear of cervix    Acute prerenal failure due to hyperemesis and pyelnonephritis(HCC) 10/03/2022   Anemia in pregnancy 03/01/2020   S/p IV feraheme 6/18 and 02/04/2020  CBC Latest Ref Rng & Units 02/06/2020 01/19/2020 12/21/2019 WBC 4.0 - 10.5 K/uL 17.3(H) 13.8(H) 14.9(H) Hemoglobin 12.0 - 15.0 g/dL 1.6(X) 8.3(L) 8.5(L) Hematocrit 36 - 46 % 32.7(L) 26.9(L) 26.7(L) Platelets 150 - 400 K/uL 483(H) 472(H) 559(H)     Depression    Depression    Elevated liver enzymes 10/13/2017   [ ]  rpt cmp with 28wk labs   Gallbladder sludge 08/22/2019   Herpes simplex 01/16/2013   History of 15wk loss (2020) 02/06/2019   [ ]  f/u antiphosphlipid abs future lab (placed 4/22)  Less than 20 weeks   History of abnormal cervical Pap smear 10/09/2017   2020: pap smear wnl   History of COVID-19 09/10/2019   COVID+ 09/08/19 symptomatic    History of herpes genitalis 10/09/2022   History of IUFD 02/06/2019   Less than 20 weeks   History of renal insufficiency 12/02/2022   Due to pyelo and dehydration but need to trend Cr   Hyperemesis gravidarum with electrolyte imbalance 10/03/2022   Twice weekly infusions-- 1L LR, Phenergan, Zofran, Protonix for 15 weeks of therapy written by Dr Alvester Morin 2.26   Will get CMP x 4 weekly      [ ]  oral potassium?   Migraine headache 11/24/2022   Sees neurologist for migraines who manages Tourette's   Migraine without aura and without status migrainosus, not intractable 05/10/2022   Pancreatitis, acute 08/20/2019   Seasonal allergies 10/09/2017   Thrombocytosis 08/16/2019   CBC  Latest Ref Rng & Units  02/06/2020   01/19/2020  12/21/2019  WBC  4.0 - 10.5 K/uL  17.3(H)  13.8(H)  14.9(H)  Hemoglobin  12.0 - 15.0 g/dL  0.9(U)  8.3(L)  8.5(L)  Hematocrit  36 - 46 %  32.7(L)  26.9(L)  26.7(L)  Platelets  150 - 400 K/uL  483(H)  472(H)  559(H)         Tourette's syndrome    Trichomoniasis     Past Surgical History Past Surgical History:  Procedure Laterality Date   COLPOSCOPY      Family History family history includes Diabetes in her mother.  Social History Social History   Socioeconomic History   Marital status: Single    Spouse name: Not on file   Number of children: Not on file   Years of education: Not on file   Highest education level: Not on  file  Occupational History   Not on file  Tobacco Use   Smoking status: Never   Smokeless tobacco: Never  Vaping Use   Vaping Use: Never used  Substance and Sexual Activity   Alcohol use: Not Currently    Comment: occasional   Drug use: Not Currently   Sexual activity: Yes    Birth control/protection: None  Other Topics Concern   Not on file  Social History Narrative   Not on file   Social Determinants of Health   Financial Resource Strain: Not on file  Food Insecurity: No Food Insecurity (07/17/2021)   Hunger Vital Sign    Worried About Running Out of Food in the Last Year: Never true    Ran Out of Food in the Last Year: Never true  Transportation Needs: No Transportation Needs (07/17/2021)   PRAPARE - Administrator, Civil Service (Medical): No    Lack of Transportation (Non-Medical): No  Physical Activity: Not on file  Stress: Not on file  Social Connections: Not on file  Intimate Partner Violence: Not on file    Laboratory Investigations Lab Results  Component Value Date   TSH 0.65 02/06/2023   FREET4 0.78 02/06/2023     Lab Results  Component Value Date   TSI CANCELED 12/11/2022         No components found for: "TRAB"   No results found for: "CHOL" No results found for: "HDL" No results found for:  "LDLCALC" No results found for: "TRIG" No results found for: "CHOLHDL" Lab Results  Component Value Date   CREATININE 0.63 01/19/2023   No results found for: "GFR"    Component Value Date/Time   NA 133 (L) 01/19/2023 1821   NA 135 10/09/2022 1044   K 3.0 (L) 01/19/2023 1821   CL 102 01/19/2023 1821   CO2 20 (L) 01/19/2023 1821   GLUCOSE 92 01/19/2023 1821   BUN 8 01/19/2023 1821   BUN 5 (L) 10/09/2022 1044   CREATININE 0.63 01/19/2023 1821   CREATININE 0.55 12/25/2022 1421   CALCIUM 8.6 (L) 01/19/2023 1821   PROT 7.4 01/19/2023 1821   PROT 6.8 10/09/2022 1044   ALBUMIN 3.0 (L) 01/19/2023 1821   ALBUMIN 3.9 10/09/2022 1044   AST 15 01/19/2023 1821   AST 11 (L) 12/25/2022 1421   ALT 10 01/19/2023 1821   ALT 7 12/25/2022 1421   ALKPHOS 95 01/19/2023 1821   BILITOT 1.4 (H) 01/19/2023 1821   BILITOT 0.3 12/25/2022 1421   GFRNONAA >60 01/19/2023 1821   GFRNONAA >60 12/25/2022 1421   GFRAA >60 02/06/2020 2203      Latest Ref Rng & Units 01/19/2023    6:21 PM 12/25/2022    2:21 PM 10/24/2022   12:00 PM  BMP  Glucose 70 - 99 mg/dL 92  77  540   BUN 6 - 20 mg/dL 8  5  9    Creatinine 0.44 - 1.00 mg/dL 9.81  1.91  4.78   Sodium 135 - 145 mmol/L 133  138  130   Potassium 3.5 - 5.1 mmol/L 3.0  3.6  3.1   Chloride 98 - 111 mmol/L 102  108  101   CO2 22 - 32 mmol/L 20  24  23    Calcium 8.9 - 10.3 mg/dL 8.6  8.0  8.2        Component Value Date/Time   WBC 13.4 (H) 01/28/2023 0924   WBC 14.2 (H) 01/19/2023 1821   RBC 3.85 01/28/2023  0924   RBC 4.28 01/19/2023 1821   HGB 10.2 (L) 01/28/2023 0924   HCT 32.6 (L) 01/28/2023 0924   PLT 572 (H) 01/28/2023 0924   MCV 85 01/28/2023 0924   MCH 26.5 (L) 01/28/2023 0924   MCH 25.9 (L) 01/19/2023 1821   MCHC 31.3 (L) 01/28/2023 0924   MCHC 31.6 01/19/2023 1821   RDW 14.3 01/28/2023 0924   LYMPHSABS 2.4 12/25/2022 1421   LYMPHSABS 2.3 10/09/2022 1044   MONOABS 0.6 12/25/2022 1421   EOSABS 0.0 12/25/2022 1421   EOSABS 0.1 10/09/2022  1044   BASOSABS 0.0 12/25/2022 1421   BASOSABS 0.1 10/09/2022 1044      Parts of this note may have been dictated using voice recognition software. There may be variances in spelling and vocabulary which are unintentional. Not all errors are proofread. Please notify the Thereasa Parkin if any discrepancies are noted or if the meaning of any statement is not clear.

## 2023-02-15 ENCOUNTER — Encounter (HOSPITAL_COMMUNITY): Payer: Self-pay | Admitting: Obstetrics & Gynecology

## 2023-02-15 ENCOUNTER — Inpatient Hospital Stay (HOSPITAL_COMMUNITY)
Admission: AD | Admit: 2023-02-15 | Discharge: 2023-02-15 | Disposition: A | Discharge status: Home / Self Care | Payer: 59 | Attending: Obstetrics & Gynecology | Admitting: Obstetrics & Gynecology

## 2023-02-15 DIAGNOSIS — Z3A29 29 weeks gestation of pregnancy: Secondary | ICD-10-CM | POA: Diagnosis not present

## 2023-02-15 DIAGNOSIS — O99343 Other mental disorders complicating pregnancy, third trimester: Secondary | ICD-10-CM | POA: Diagnosis not present

## 2023-02-15 DIAGNOSIS — O212 Late vomiting of pregnancy: Secondary | ICD-10-CM | POA: Insufficient documentation

## 2023-02-15 DIAGNOSIS — Z79899 Other long term (current) drug therapy: Secondary | ICD-10-CM | POA: Diagnosis not present

## 2023-02-15 DIAGNOSIS — R112 Nausea with vomiting, unspecified: Secondary | ICD-10-CM

## 2023-02-15 DIAGNOSIS — O09293 Supervision of pregnancy with other poor reproductive or obstetric history, third trimester: Secondary | ICD-10-CM | POA: Diagnosis present

## 2023-02-15 DIAGNOSIS — O26893 Other specified pregnancy related conditions, third trimester: Secondary | ICD-10-CM | POA: Diagnosis not present

## 2023-02-15 DIAGNOSIS — O98513 Other viral diseases complicating pregnancy, third trimester: Secondary | ICD-10-CM | POA: Diagnosis present

## 2023-02-15 DIAGNOSIS — R519 Headache, unspecified: Secondary | ICD-10-CM | POA: Diagnosis not present

## 2023-02-15 LAB — URINALYSIS, ROUTINE W REFLEX MICROSCOPIC
Bilirubin Urine: NEGATIVE
Glucose, UA: NEGATIVE mg/dL
Hgb urine dipstick: NEGATIVE
Ketones, ur: 20 mg/dL — AB
Nitrite: NEGATIVE
Protein, ur: NEGATIVE mg/dL
Specific Gravity, Urine: 1.01 (ref 1.005–1.030)
pH: 7 (ref 5.0–8.0)

## 2023-02-15 MED ORDER — METOCLOPRAMIDE HCL 5 MG/ML IJ SOLN
5.0000 mg | Freq: Once | INTRAMUSCULAR | Status: AC
  Administered 2023-02-15: 5 mg via INTRAVENOUS
  Filled 2023-02-15: qty 2

## 2023-02-15 MED ORDER — ONDANSETRON HCL 4 MG/2ML IJ SOLN
4.0000 mg | Freq: Once | INTRAMUSCULAR | Status: AC
  Administered 2023-02-15: 4 mg via INTRAVENOUS
  Filled 2023-02-15: qty 2

## 2023-02-15 MED ORDER — SCOPOLAMINE 1 MG/3DAYS TD PT72
1.0000 | MEDICATED_PATCH | Freq: Once | TRANSDERMAL | Status: DC
  Administered 2023-02-15: 1.5 mg via TRANSDERMAL
  Filled 2023-02-15: qty 1

## 2023-02-15 MED ORDER — DIPHENHYDRAMINE HCL 50 MG/ML IJ SOLN
25.0000 mg | Freq: Once | INTRAMUSCULAR | Status: AC
  Administered 2023-02-15: 25 mg via INTRAVENOUS
  Filled 2023-02-15: qty 1

## 2023-02-15 MED ORDER — LACTATED RINGERS IV BOLUS
1000.0000 mL | Freq: Once | INTRAVENOUS | Status: AC
  Administered 2023-02-15: 1000 mL via INTRAVENOUS

## 2023-02-15 NOTE — MAU Note (Signed)
...  Jody Robinson is a 32 y.o. at [redacted]w[redacted]d here in MAU reporting: She reports she has had an intermittent HA over the past two days. She reports she has not taken anything for her HA because it does not work. She reports she has also felt dehydrated since this past Tuesday. She reports she was unable to have any IV infusions this past week. Next IV infusion scheduled for Tuesday. Has vomited around 6 times since yesterday. Denies VB or LOF. +FM.  -Last infusion on 6/24. Patient received Lactated Ringers fluid bolus over 1 hour, IVPB Phenergan 12.5 mg, IVPB Zofran 8 mg, and IV Protonix 40 mg via PIV. -Last ate fruit around 0130 this morning. Reports she is able to eat fruit, ensures, and PB&J.  Pain score: 9/10 HA   FHT: 137 initial external Lab orders placed from triage: UA

## 2023-02-15 NOTE — MAU Provider Note (Signed)
History     CSN: [id number]  Arrival date and time: 02/15/23 1151   Event Date/Time   First Provider Initiated Contact with Patient 02/15/23 1232      Chief Complaint  Patient presents with   Nausea   Emesis   Headache   Jody Robinson , a  32 y.o. [phone] at [redacted]w[redacted]d presents to MAU with complaints of nausea and vomiting and a headache. Patient states that her head has been hurting for a few days she/her/hers currently rates pain a 9/10 but denies attempting to relieve symptoms.  She states she usually takes tylenol, but "it doesnt work" she states that she tried to take Excedrin Tension but states it was not Rx's do she couldn't get it. She states that it right across the back of her eyes and states that sound does make it worse. She has on-going hyperemesis that she receives IV infusions for. Last one was 6/24. Next one on Tuesday. She states that over the last 24 hours she has vomited >6 times. She denies attempting to relieve those symptoms as well. She denies abdominal pain,vaginal bleeding, discharge or contractions. She endorses positive fetal movement.          OB History     Gravida  4   Para  2   Term  2   Preterm      AB  1   Living  2      SAB  1   IAB      Ectopic      Multiple  0   Live Births  2           Past Medical History:  Diagnosis Date   Abnormal Pap smear of cervix    Acute prerenal failure due to hyperemesis and pyelnonephritis(HCC) 10/03/2022   Anemia in pregnancy 03/01/2020   S/p IV feraheme 6/18 and 02/04/2020  CBC Latest Ref Rng & Units 02/06/2020 01/19/2020 12/21/2019 WBC 4.0 - 10.5 K/uL 17.3(H) 13.8(H) 14.9(H) Hemoglobin 12.0 - 15.0 g/dL 1.4(N) 8.3(L) 8.5(L) Hematocrit 36 - 46 % 32.7(L) 26.9(L) 26.7(L) Platelets 150 - 400 K/uL 483(H) 472(H) 559(H)     Depression    Depression    Elevated liver enzymes 10/13/2017   [ ]  rpt cmp with 28wk labs   Gallbladder sludge 08/22/2019   Herpes simplex 01/16/2013   History of 15wk loss  (2020) 02/06/2019   [ ]  f/u antiphosphlipid abs future lab (placed 4/22)  Less than 20 weeks   History of abnormal cervical Pap smear 10/09/2017   2020: pap smear wnl   History of COVID-19 09/10/2019   COVID+ 09/08/19 symptomatic    History of herpes genitalis 10/09/2022   History of IUFD 02/06/2019   Less than 20 weeks   History of renal insufficiency 12/02/2022   Due to pyelo and dehydration but need to trend Cr   Hyperemesis gravidarum with electrolyte imbalance 10/03/2022   Twice weekly infusions-- 1L LR, Phenergan, Zofran, Protonix for 15 weeks of therapy written by Dr Alvester Morin 2.26   Will get CMP x 4 weekly      [ ]  oral potassium?   Migraine headache 11/24/2022   Sees neurologist for migraines who manages Tourette's   Migraine without aura and without status migrainosus, not intractable 05/10/2022   Pancreatitis, acute 08/20/2019   Seasonal allergies 10/09/2017   Thrombocytosis 08/16/2019   CBC  Latest Ref Rng & Units  02/06/2020  01/19/2020  12/21/2019  WBC  4.0 - 10.5 K/uL  17.3(H)  13.8(H)  14.9(H)  Hemoglobin  12.0 - 15.0 g/dL  1.6(X)  8.3(L)  8.5(L)  Hematocrit  36 - 46 %  32.7(L)  26.9(L)  26.7(L)  Platelets  150 - 400 K/uL  483(H)  472(H)  559(H)         Tourette's syndrome    Trichomoniasis     Past Surgical History:  Procedure Laterality Date   COLPOSCOPY      Family History  Problem Relation Age of Onset   Diabetes Mother     Social History   Tobacco Use   Smoking status: Never   Smokeless tobacco: Never  Vaping Use   Vaping Use: Never used  Substance Use Topics   Alcohol use: Not Currently    Comment: occasional   Drug use: Not Currently    Allergies: No Known Allergies  Medications Prior to Admission  Medication Sig Dispense Refill Last Dose   aspirin EC 81 MG tablet Take 1 tablet (81 mg total) by mouth daily. Swallow whole. 30 tablet 12 02/14/2023   metoCLOPramide (REGLAN) 10 MG tablet Take 1 tablet (10 mg total) by mouth 4 (four) times daily. 30 tablet  2 Past Week   ondansetron (ZOFRAN-ODT) 4 MG disintegrating tablet Take 1 tablet (4 mg total) by mouth every 6 (six) hours as needed for nausea or vomiting. 30 tablet 3 Past Week   prenatal vitamin w/FE, FA (PRENATAL 1 + 1) 27-1 MG TABS tablet Take 1 tablet by mouth daily at 12 noon. 30 tablet 11 Past Week   promethazine (PHENERGAN) 25 MG tablet Take 1 tablet (25 mg total) by mouth every 6 (six) hours as needed for nausea, vomiting or refractory nausea / vomiting. 30 tablet 2 Past Week   acetaminophen (TYLENOL) 325 MG tablet Take 650 mg by mouth every 6 (six) hours as needed for mild pain or headache. (Patient not taking: Reported on 02/11/2023)      acetaminophen-caffeine (EXCEDRIN TENSION HEADACHE) 500-65 MG TABS per tablet Take 1 tablet by mouth every 6 (six) hours as needed (Headache). (Patient not taking: Reported on 01/08/2023) 60 tablet 0    Blood Pressure Monitoring DEVI 1 each by Does not apply route once a week. 1 each 0    famotidine (PEPCID) 20 MG tablet Take 1 tablet (20 mg total) by mouth 2 (two) times daily. 60 tablet 2    potassium chloride SA (KLOR-CON M) 20 MEQ tablet Take 1 tablet (20 mEq total) by mouth 2 (two) times daily for 3 days. (Patient not taking: Reported on 02/11/2023) 6 tablet 0    scopolamine (TRANSDERM-SCOP) 1 MG/3DAYS PLACE 1 PATCH ONTO THE SKIN EVERY 3 DAYS.       Review of Systems  Constitutional:  Negative for chills, fatigue and fever.  Eyes:  Positive for photophobia. Negative for pain and visual disturbance.  Respiratory:  Negative for apnea, shortness of breath and wheezing.   Cardiovascular:  Negative for chest pain and palpitations.  Gastrointestinal:  Positive for nausea and vomiting. Negative for abdominal pain, constipation and diarrhea.  Genitourinary:  Negative for difficulty urinating, dysuria, pelvic pain, vaginal bleeding, vaginal discharge and vaginal pain.  Musculoskeletal:  Negative for back pain.  Neurological:  Positive for dizziness,  light-headedness and headaches. Negative for seizures and weakness.  Psychiatric/Behavioral:  Negative for suicidal ideas.    Physical Exam   Blood pressure 101/64, pulse (!) 132, temperature 98.2 F (36.8 C), temperature source Oral, resp. rate 16, height 5' (1.524 m), weight 65.5 kg, last menstrual period  07/22/2022, SpO2 97 %.  Physical Exam Vitals and nursing note reviewed.  Constitutional:      General: She is not in acute distress.    Appearance: Normal appearance. She is ill-appearing.     Comments: Patient actively vomiting   HENT:     Head: Normocephalic.  Cardiovascular:     Rate and Rhythm: Normal rate.  Pulmonary:     Effort: Pulmonary effort is normal.  Abdominal:     Palpations: Abdomen is soft.  Musculoskeletal:     Cervical back: Normal range of motion.  Skin:    General: Skin is warm and dry.  Neurological:     Mental Status: She is alert and oriented to person, place, and time.     GCS: GCS eye subscore is 4. GCS verbal subscore is 5. GCS motor subscore is 6.  Psychiatric:        Mood and Affect: Mood normal.    FHT: 150 bpm with moderate variability. Accels present. Decels absent.  Toco: Quiet   MAU Course  Procedures Orders Placed This Encounter  Procedures   Urinalysis, Routine w reflex microscopic -Urine, Clean Catch   Insert peripheral IV   Meds ordered this encounter  Medications   ondansetron (ZOFRAN) injection 4 mg   scopolamine (TRANSDERM-SCOP) 1 MG/3DAYS 1.5 mg   lactated ringers bolus 1,000 mL   diphenhydrAMINE (BENADRYL) injection 25 mg   metoCLOPramide (REGLAN) injection 5 mg   Results for orders placed or performed during the hospital encounter of 02/15/23 (from the past 24 hour(s))  Urinalysis, Routine w reflex microscopic -Urine, Clean Catch     Status: Abnormal   Collection Time: 02/15/23 12:11 PM  Result Value Ref Range   Color, Urine YELLOW YELLOW   APPearance HAZY (A) CLEAR   Specific Gravity, Urine 1.010 1.005 - 1.030    pH 7.0 5.0 - 8.0   Glucose, UA NEGATIVE NEGATIVE mg/dL   Hgb urine dipstick NEGATIVE NEGATIVE   Bilirubin Urine NEGATIVE NEGATIVE   Ketones, ur 20 (A) NEGATIVE mg/dL   Protein, ur NEGATIVE NEGATIVE mg/dL   Nitrite NEGATIVE NEGATIVE   Leukocytes,Ua LARGE (A) NEGATIVE   RBC / HPF 0-5 0 - 5 RBC/hpf   WBC, UA 0-5 0 - 5 WBC/hpf   Bacteria, UA RARE (A) NONE SEEN   Squamous Epithelial / HPF 6-10 0 - 5 /HPF   Mucus PRESENT    Hyaline Casts, UA PRESENT      MDM - Antiemetics and Fluids given  - Headache improving with IV benadryl and Reglan.  - PO Challenge successful - Plan for discharge.   Assessment and Plan   1. Nausea and vomiting, unspecified vomiting type   2. Pregnancy headache in third trimester   3. [redacted] weeks gestation of pregnancy    - Recommended that patient stay on top of her antiemetics.  - Reviewed the Excedrin Tension is available OTC.  - Next infusion scheduled for Tuesday. Encouraged to keep appointment.  - FHT appropriate for gestational age at time of discharge.  - Worsening signs and return precautions reviewed.  - Patient discharged home in stable condition and may return to MAU as needed.    Claudette Head, MSN CNM  02/15/2023, 12:33 PM

## 2023-02-18 ENCOUNTER — Ambulatory Visit (INDEPENDENT_AMBULATORY_CARE_PROVIDER_SITE_OTHER): Payer: 59 | Admitting: Family Medicine

## 2023-02-18 ENCOUNTER — Encounter: Payer: Self-pay | Admitting: Family Medicine

## 2023-02-18 ENCOUNTER — Non-Acute Institutional Stay (HOSPITAL_COMMUNITY)
Admission: RE | Admit: 2023-02-18 | Discharge: 2023-02-18 | Disposition: A | Discharge status: Home / Self Care | Payer: 59 | Source: Ambulatory Visit | Attending: Internal Medicine | Admitting: Internal Medicine

## 2023-02-18 ENCOUNTER — Other Ambulatory Visit: Payer: Self-pay

## 2023-02-18 VITALS — BP 101/67 | HR 73 | Wt 148.0 lb

## 2023-02-18 DIAGNOSIS — O99283 Endocrine, nutritional and metabolic diseases complicating pregnancy, third trimester: Secondary | ICD-10-CM

## 2023-02-18 DIAGNOSIS — E0590 Thyrotoxicosis, unspecified without thyrotoxic crisis or storm: Secondary | ICD-10-CM

## 2023-02-18 DIAGNOSIS — O35EXX0 Maternal care for other (suspected) fetal abnormality and damage, fetal genitourinary anomalies, not applicable or unspecified: Secondary | ICD-10-CM

## 2023-02-18 DIAGNOSIS — O0990 Supervision of high risk pregnancy, unspecified, unspecified trimester: Secondary | ICD-10-CM

## 2023-02-18 DIAGNOSIS — D75839 Thrombocytosis, unspecified: Secondary | ICD-10-CM

## 2023-02-18 DIAGNOSIS — O211 Hyperemesis gravidarum with metabolic disturbance: Secondary | ICD-10-CM | POA: Insufficient documentation

## 2023-02-18 DIAGNOSIS — Z3A30 30 weeks gestation of pregnancy: Secondary | ICD-10-CM

## 2023-02-18 DIAGNOSIS — Z3A00 Weeks of gestation of pregnancy not specified: Secondary | ICD-10-CM | POA: Diagnosis not present

## 2023-02-18 DIAGNOSIS — O0993 Supervision of high risk pregnancy, unspecified, third trimester: Secondary | ICD-10-CM

## 2023-02-18 MED ORDER — ONDANSETRON 8 MG/NS 50 ML IVPB
8.0000 mg | Freq: Once | INTRAVENOUS | Status: AC
  Administered 2023-02-18: 8 mg via INTRAVENOUS
  Filled 2023-02-18: qty 8

## 2023-02-18 MED ORDER — PANTOPRAZOLE SODIUM 40 MG IV SOLR
40.0000 mg | Freq: Once | INTRAVENOUS | Status: AC
  Administered 2023-02-18: 40 mg via INTRAVENOUS
  Filled 2023-02-18: qty 10

## 2023-02-18 MED ORDER — LACTATED RINGERS IV BOLUS
1000.0000 mL | Freq: Once | INTRAVENOUS | Status: AC
  Administered 2023-02-18: 1000 mL via INTRAVENOUS

## 2023-02-18 MED ORDER — SODIUM CHLORIDE 0.9 % IV SOLN: INTRAVENOUS | Status: DC | PRN

## 2023-02-18 MED ORDER — SODIUM CHLORIDE 0.9 % IV SOLN
12.5000 mg | Freq: Once | INTRAVENOUS | Status: AC
  Administered 2023-02-18: 12.5 mg via INTRAVENOUS
  Filled 2023-02-18: qty 12.5

## 2023-02-18 NOTE — Progress Notes (Signed)
   Subjective:  Jody Robinson is a 32 y.o. [id number] at [redacted]w[redacted]d being seen today for ongoing prenatal care.  She is currently monitored for the following issues for this high-risk pregnancy and has Thrombocytosis; Hyperthyroidism affecting pregnancy in second trimester; Tourette's disease; Supervision of high risk pregnancy, antepartum; and Obesity affecting pregnancy on their problem list.  Patient reports no complaints.  Contractions: Not present. Vag. Bleeding: None.  Movement: Present. Denies leaking of fluid.   The following portions of the patient's history were reviewed and updated as appropriate: allergies, current medications, past family history, past medical history, past social history, past surgical history and problem list. Problem list updated.  Objective:   Vitals:   02/18/23 1527  BP: 101/67  Pulse: 73  Weight: 148 lb (67.1 kg)    Fetal Status: Fetal Heart Rate (bpm): 150 Fundal Height: 30 cm Movement: Present     General:  Alert, oriented and cooperative. Patient is in no acute distress.  Skin: Skin is warm and dry. No rash noted.   Cardiovascular: Normal heart rate noted  Respiratory: Normal respiratory effort, no problems with respiration noted  Abdomen: Soft, gravid, appropriate for gestational age. Pain/Pressure: Present     Pelvic: Vag. Bleeding: None     Cervical exam deferred        Extremities: Normal range of motion.  Edema: None  Mental Status: Normal mood and affect. Normal behavior. Normal judgment and thought content.   Urinalysis:      Assessment and Plan:  Pregnancy: [id number] at [redacted]w[redacted]d  1. Supervision of high risk pregnancy, antepartum BP and FHR normal Interested in post placental IUD but declined further discussion of the method  2. Hyperthyroidism affecting pregnancy in second trimester Normal TFT's on 02/06/23 Seen by Endo on 02/11/2023, no further visits scheduled  3. Thrombocytosis Improving, thought to be secondary to iron deficiency  anemia S/p several doses of IV iron ctm  4. Fetal renal anomaly, single gestation (UTDA1, right) Resolved as of last [name]  Preterm labor symptoms and general obstetric precautions including but not limited to vaginal bleeding, contractions, leaking of fluid and fetal movement were reviewed in detail with the patient. Please refer to After Visit Summary for other counseling recommendations.  Return in 2 weeks (on 03/04/2023) for [facility], ob visit.   Venora Maples, MD

## 2023-02-18 NOTE — Patient Instructions (Signed)

## 2023-02-18 NOTE — Progress Notes (Signed)
PATIENT CARE CENTER NOTE  Diagnosis: Hyperemesis gravidarum with electrolyte imbalance [O21.1]      Provider: Lyndel Safe MD  Procedure: Fluid Bolus and Anti-emetics (dose #26 of 30)   Note: Patient received 1 Litre Lactated Ringers fluid bolus over 1 hour, IVPB Phenergan 12.5 mg, IVPB Zofran 8 mg, and IV Protonix 40 mg via PIV. Pt tolerated infusion and medication with no adverse reaction. Vital signs are stable. Pt can come twice a week for a total of 30 doses. Pt advised to schedule next appointment at front desk. AVS offered, but pt declined. Pt is alert, oriented, and ambulatory at discharge.

## 2023-02-20 ENCOUNTER — Inpatient Hospital Stay (HOSPITAL_BASED_OUTPATIENT_CLINIC_OR_DEPARTMENT_OTHER): Payer: 59 | Admitting: Hematology and Oncology

## 2023-02-20 ENCOUNTER — Other Ambulatory Visit: Payer: Self-pay

## 2023-02-20 ENCOUNTER — Inpatient Hospital Stay: Payer: 59 | Attending: Hematology and Oncology

## 2023-02-20 ENCOUNTER — Other Ambulatory Visit: Payer: Self-pay | Admitting: Hematology and Oncology

## 2023-02-20 VITALS — BP 112/82 | HR 88 | Temp 97.5°F | Resp 15 | Wt 151.0 lb

## 2023-02-20 DIAGNOSIS — Z3A30 30 weeks gestation of pregnancy: Secondary | ICD-10-CM | POA: Diagnosis not present

## 2023-02-20 DIAGNOSIS — D75839 Thrombocytosis, unspecified: Secondary | ICD-10-CM | POA: Insufficient documentation

## 2023-02-20 DIAGNOSIS — D500 Iron deficiency anemia secondary to blood loss (chronic): Secondary | ICD-10-CM

## 2023-02-20 DIAGNOSIS — D509 Iron deficiency anemia, unspecified: Secondary | ICD-10-CM | POA: Diagnosis present

## 2023-02-20 DIAGNOSIS — O99013 Anemia complicating pregnancy, third trimester: Secondary | ICD-10-CM | POA: Insufficient documentation

## 2023-02-20 DIAGNOSIS — O99112 Other diseases of the blood and blood-forming organs and certain disorders involving the immune mechanism complicating pregnancy, second trimester: Secondary | ICD-10-CM | POA: Insufficient documentation

## 2023-02-20 LAB — CMP (CANCER CENTER ONLY)
ALT: 9 U/L (ref 0–44)
AST: 13 U/L — ABNORMAL LOW (ref 15–41)
Albumin: 3.2 g/dL — ABNORMAL LOW (ref 3.5–5.0)
Alkaline Phosphatase: 93 U/L (ref 38–126)
Anion gap: 7 (ref 5–15)
BUN: 5 mg/dL — ABNORMAL LOW (ref 6–20)
CO2: 22 mmol/L (ref 22–32)
Calcium: 8.4 mg/dL — ABNORMAL LOW (ref 8.9–10.3)
Chloride: 108 mmol/L (ref 98–111)
Creatinine: 0.54 mg/dL (ref 0.44–1.00)
GFR, Estimated: >60 mL/min (ref >60)
Glucose, Bld: 74 mg/dL (ref 70–99)
Potassium: 3.8 mmol/L (ref 3.5–5.1)
Sodium: 137 mmol/L (ref 135–145)
Total Bilirubin: 0.6 mg/dL (ref 0.3–1.2)
Total Protein: 6.6 g/dL (ref 6.5–8.1)

## 2023-02-20 LAB — RETIC PANEL
Immature Retic Fract: 17.2 % — ABNORMAL HIGH (ref 2.3–15.9)
RBC.: 3.66 MIL/uL — ABNORMAL LOW (ref 3.87–5.11)
Retic Count, Absolute: 91.5 10*3/uL (ref 19.0–186.0)
Retic Ct Pct: 2.5 % (ref 0.4–3.1)
Reticulocyte Hemoglobin: 30.6 pg (ref >27.9)

## 2023-02-20 LAB — CBC WITH DIFFERENTIAL (CANCER CENTER ONLY)
Abs Immature Granulocytes: 0.07 10*3/uL (ref 0.00–0.07)
Basophils Absolute: 0 10*3/uL (ref 0.0–0.1)
Basophils Relative: 0 %
Eosinophils Absolute: 0 10*3/uL (ref 0.0–0.5)
Eosinophils Relative: 0 %
HCT: 32.3 % — ABNORMAL LOW (ref 36.0–46.0)
Hemoglobin: 10.3 g/dL — ABNORMAL LOW (ref 12.0–15.0)
Immature Granulocytes: 1 %
Lymphocytes Relative: 19 %
Lymphs Abs: 2 10*3/uL (ref 0.7–4.0)
MCH: 27.2 pg (ref 26.0–34.0)
MCHC: 31.9 g/dL (ref 30.0–36.0)
MCV: 85.4 fL (ref 80.0–100.0)
Monocytes Absolute: 0.5 10*3/uL (ref 0.1–1.0)
Monocytes Relative: 5 %
Neutro Abs: 7.9 10*3/uL — ABNORMAL HIGH (ref 1.7–7.7)
Neutrophils Relative %: 75 %
Platelet Count: 424 10*3/uL — ABNORMAL HIGH (ref 150–400)
RBC: 3.78 MIL/uL — ABNORMAL LOW (ref 3.87–5.11)
RDW: 15.3 % (ref 11.5–15.5)
WBC Count: 10.5 10*3/uL (ref 4.0–10.5)
nRBC: 0 % (ref 0.0–0.2)

## 2023-02-20 LAB — IRON AND IRON BINDING CAPACITY (CC-WL,HP ONLY)
Iron: 98 ug/dL (ref 28–170)
Saturation Ratios: 25 % (ref 10.4–31.8)
TIBC: 395 ug/dL (ref 250–450)
UIBC: 297 ug/dL (ref 148–442)

## 2023-02-20 LAB — FERRITIN: Ferritin: 106 ng/mL (ref 11–307)

## 2023-02-20 NOTE — Progress Notes (Signed)
[facility] [phone]   [phone]  PROGRESS NOTE  Patient Care Team: Pcp, No as PCP - General Marion Bing, MD as PCP - OBGYN (Obstetrics and Gynecology)  Hematological/Oncological History # Thrombocytosis in Pregnancy  12/25/2022: establish care with Dr. Leonides Schanz.  Labs showed ferritin of 5 with iron sat of 11%. 01/18/2023 - 01/24/2023: 2 doses of IV Feraheme.  Interval History:  Jody Robinson 32 y.o. female with medical history significant for thrombocytosis in pregnancy who presents for a follow up visit. The patient's last visit was on 12/25/2022. In the interim since the last visit he received 2 doses of IV Feraheme in June.  On exam today Mrs. Steidel reports she did unfortunately have some nausea and vomiting after she got home after the iron infusions.  She reports it occurred for about 3 days after the first dose in 2 days after the second dose.  She reports that IV fluids did help with this.  She notes that now she feels quite good.  Her energy levels are improved and she is not having any lightheadedness, dizziness, or shortness of breath.  She has not had any overt signs of bleeding such as nosebleeds, gum bleeding, or dark stools.  She reports that her delivery date is currently September 16 at about 9 weeks.  She has had no other issues with the pregnancy moving forward.  She otherwise denies any fevers, chills, sweats, nausea, vomiting or diarrhea.  A full 10 point ROS is otherwise negative.  MEDICAL HISTORY:  Past Medical History:  Diagnosis Date   Abnormal Pap smear of cervix    Acute prerenal failure due to hyperemesis and pyelnonephritis(HCC) 10/03/2022   Anemia in pregnancy 03/01/2020   S/p IV feraheme 6/18 and 02/04/2020  CBC Latest Ref Rng & Units 02/06/2020 01/19/2020 12/21/2019 WBC 4.0 - 10.5 K/uL 17.3(H) 13.8(H) 14.9(H) Hemoglobin 12.0 - 15.0 g/dL 1.4(N) 8.3(L) 8.5(L) Hematocrit 36 - 46 % 32.7(L) 26.9(L) 26.7(L) Platelets 150 - 400  K/uL 483(H) 472(H) 559(H)     Depression    Depression    Elevated liver enzymes 10/13/2017   [ ]  rpt cmp with 28wk labs   Gallbladder sludge 08/22/2019   Herpes simplex 01/16/2013   History of 15wk loss (2020) 02/06/2019   [ ]  f/u antiphosphlipid abs future lab (placed 4/22)  Less than 20 weeks   History of abnormal cervical Pap smear 10/09/2017   2020: pap smear wnl   History of COVID-19 09/10/2019   COVID+ 09/08/19 symptomatic    History of herpes genitalis 10/09/2022   History of IUFD 02/06/2019   Less than 20 weeks   History of renal insufficiency 12/02/2022   Due to pyelo and dehydration but need to trend Cr   Hyperemesis gravidarum with electrolyte imbalance 10/03/2022   Twice weekly infusions-- 1L LR, Phenergan, Zofran, Protonix for 15 weeks of therapy written by Dr Alvester Morin 2.26   Will get CMP x 4 weekly      [ ]  oral potassium?   Migraine headache 11/24/2022   Sees neurologist for migraines who manages Tourette's   Migraine without aura and without status migrainosus, not intractable 05/10/2022   Pancreatitis, acute 08/20/2019   Seasonal allergies 10/09/2017   Thrombocytosis 08/16/2019   CBC  Latest Ref Rng & Units  02/06/2020  01/19/2020  12/21/2019  WBC  4.0 - 10.5 K/uL  17.3(H)  13.8(H)  14.9(H)  Hemoglobin  12.0 - 15.0 g/dL  8.2(N)  8.3(L)  8.5(L)  Hematocrit  36 -  46 %  32.7(L)  26.9(L)  26.7(L)  Platelets  150 - 400 K/uL  483(H)  472(H)  559(H)         Tourette's syndrome    Trichomoniasis     SURGICAL HISTORY: Past Surgical History:  Procedure Laterality Date   COLPOSCOPY      SOCIAL HISTORY: Social History   Socioeconomic History   Marital status: Single    Spouse name: Not on file   Number of children: Not on file   Years of education: Not on file   Highest education level: Not on file  Occupational History   Not on file  Tobacco Use   Smoking status: Never   Smokeless tobacco: Never  Vaping Use   Vaping status: Never Used  Substance and Sexual  Activity   Alcohol use: Not Currently    Comment: occasional   Drug use: Not Currently   Sexual activity: Yes    Birth control/protection: None  Other Topics Concern   Not on file  Social History Narrative   Not on file   Social Determinants of Health   Financial Resource Strain: Not on file  Food Insecurity: No Food Insecurity (07/17/2021)   Hunger Vital Sign    Worried About Running Out of Food in the Last Year: Never true    Ran Out of Food in the Last Year: Never true  Transportation Needs: No Transportation Needs (07/17/2021)   PRAPARE - Administrator, Civil Service (Medical): No    Lack of Transportation (Non-Medical): No  Physical Activity: Not on file  Stress: Not on file  Social Connections: Not on file  Intimate Partner Violence: Not on file    FAMILY HISTORY: Family History  Problem Relation Age of Onset   Diabetes Mother     ALLERGIES:  has No Known Allergies.  MEDICATIONS:  Current Outpatient Medications  Medication Sig Dispense Refill   acetaminophen (TYLENOL) 325 MG tablet Take 650 mg by mouth every 6 (six) hours as needed for mild pain or headache.     aspirin EC 81 MG tablet Take 1 tablet (81 mg total) by mouth daily. Swallow whole. 30 tablet 12   famotidine (PEPCID) 20 MG tablet Take 1 tablet (20 mg total) by mouth 2 (two) times daily. 60 tablet 2   metoCLOPramide (REGLAN) 10 MG tablet Take 1 tablet (10 mg total) by mouth 4 (four) times daily. 30 tablet 2   ondansetron (ZOFRAN-ODT) 4 MG disintegrating tablet Take 1 tablet (4 mg total) by mouth every 6 (six) hours as needed for nausea or vomiting. 30 tablet 3   prenatal vitamin w/FE, FA (PRENATAL 1 + 1) 27-1 MG TABS tablet Take 1 tablet by mouth daily at 12 noon. 30 tablet 11   promethazine (PHENERGAN) 25 MG tablet Take 1 tablet (25 mg total) by mouth every 6 (six) hours as needed for nausea, vomiting or refractory nausea / vomiting. 30 tablet 2   scopolamine (TRANSDERM-SCOP) 1 MG/3DAYS PLACE 1  PATCH ONTO THE SKIN EVERY 3 DAYS.     No current facility-administered medications for this visit.    REVIEW OF SYSTEMS:   Constitutional: ( - ) fevers, ( - )  chills , ( - ) night sweats Eyes: ( - ) blurriness of vision, ( - ) double vision, ( - ) watery eyes Ears, nose, mouth, throat, and face: ( - ) mucositis, ( - ) sore throat Respiratory: ( - ) cough, ( - ) dyspnea, ( - ) wheezes  Cardiovascular: ( - ) palpitation, ( - ) chest discomfort, ( - ) lower extremity swelling Gastrointestinal:  ( - ) nausea, ( - ) heartburn, ( - ) change in bowel habits Skin: ( - ) abnormal skin rashes Lymphatics: ( - ) new lymphadenopathy, ( - ) easy bruising Neurological: ( - ) numbness, ( - ) tingling, ( - ) new weaknesses Behavioral/Psych: ( - ) mood change, ( - ) new changes  All other systems were reviewed with the patient and are negative.  PHYSICAL EXAMINATION:  Vitals:   02/20/23 1132  BP: 112/82  Pulse: 88  Resp: 15  Temp: (!) 97.5 F (36.4 C)  SpO2: 100%   Filed Weights   02/20/23 1132  Weight: 151 lb (68.5 kg)    GENERAL: Well-appearing middle-aged African-American female, alert, no distress and comfortable SKIN: skin color, texture, turgor are normal, no rashes or significant lesions EYES: conjunctiva are pink and non-injected, sclera clear LUNGS: clear to auscultation and percussion with normal breathing effort HEART: regular rate & rhythm and no murmurs and no lower extremity edema Musculoskeletal: no cyanosis of digits and no clubbing  PSYCH: alert & oriented x 3, fluent speech NEURO: no focal motor/sensory deficits  LABORATORY DATA:  I have reviewed the data as listed    Latest Ref Rng & Units 02/20/2023   11:08 AM 01/28/2023    9:24 AM 01/19/2023    6:21 PM  CBC  WBC 4.0 - 10.5 K/uL 10.5  13.4  14.2   Hemoglobin 12.0 - 15.0 g/dL 59.5  63.8  75.6   Hematocrit 36.0 - 46.0 % 32.3  32.6  35.1   Platelets 150 - 400 K/uL 424  572  713        Latest Ref Rng & Units  02/20/2023   11:08 AM 01/19/2023    6:21 PM 12/25/2022    2:21 PM  CMP  Glucose 70 - 99 mg/dL 74  92  77   BUN 6 - 20 mg/dL 5  8  5    Creatinine 0.44 - 1.00 mg/dL 4.33  2.95  1.88   Sodium 135 - 145 mmol/L 137  133  138   Potassium 3.5 - 5.1 mmol/L 3.8  3.0  3.6   Chloride 98 - 111 mmol/L 108  102  108   CO2 22 - 32 mmol/L 22  20  24    Calcium 8.9 - 10.3 mg/dL 8.4  8.6  8.0   Total Protein 6.5 - 8.1 g/dL 6.6  7.4  6.2   Total Bilirubin 0.3 - 1.2 mg/dL 0.6  1.4  0.3   Alkaline Phos 38 - 126 U/L 93  95  68   AST 15 - 41 U/L 13  15  11    ALT 0 - 44 U/L 9  10  7      RADIOGRAPHIC STUDIES: No results found.  ASSESSMENT & PLAN Ahilyn Hibdon 32 y.o. female with medical history significant for thrombocytosis in pregnancy who presents for a follow up visit.  After review of the labs, review of the records, and discussion with the patient the patients findings are most consistent with iron deficiency anemia in the setting of pregnancy   # Iron Deficiency Anemia in Setting of Pregnancy # Thrombocytosis 2/2 to Iron Deficiency  -- Findings are consistent with iron deficiency anemia secondary to pregnancy.  Patient likely had low iron stores due to menstrual bleeding prior to pregnancy. -- Recommend avoiding the use of p.o. iron therapy after the first  trimester as it can cause constipation and is poorly absorbed. --Patient received 2 doses of Feraheme, 1 on 01/18/2023 and 01/24/2023 -- Labs today show white blood cell 10.5, hemoglobin 10.3, MCV 85.4, and platelets of 424.  Ferritin is 106 with saturation of 25%. -- Iron levels appear replete, no indication for further IV iron. -- RTC 6 months post delivery.   No orders of the defined types were placed in this encounter.   All questions were answered. The patient knows to call the clinic with any problems, questions or concerns.  A total of more than 30 minutes were spent on this encounter with face-to-face time and non-face-to-face time,  including preparing to see the patient, ordering tests and/or medications, counseling the patient and coordination of care as outlined above.   Ulysees Barns, MD Department of Hematology/Oncology [facility] Phone: [phone] Pager: [phone] Email: [email or web address]  03/01/2023 2:28 PM

## 2023-02-25 ENCOUNTER — Non-Acute Institutional Stay (HOSPITAL_COMMUNITY)
Admission: RE | Admit: 2023-02-25 | Discharge: 2023-02-25 | Disposition: A | Discharge status: Home / Self Care | Payer: 59 | Source: Ambulatory Visit | Attending: Internal Medicine | Admitting: Internal Medicine

## 2023-02-25 DIAGNOSIS — O211 Hyperemesis gravidarum with metabolic disturbance: Secondary | ICD-10-CM | POA: Insufficient documentation

## 2023-02-25 DIAGNOSIS — Z3A00 Weeks of gestation of pregnancy not specified: Secondary | ICD-10-CM | POA: Insufficient documentation

## 2023-02-25 MED ORDER — SODIUM CHLORIDE 0.9 % IV SOLN: INTRAVENOUS | Status: DC | PRN

## 2023-02-25 MED ORDER — SODIUM CHLORIDE 0.9 % IV SOLN
12.5000 mg | Freq: Once | INTRAVENOUS | Status: AC
  Administered 2023-02-25: 12.5 mg via INTRAVENOUS
  Filled 2023-02-25: qty 12.5

## 2023-02-25 MED ORDER — LACTATED RINGERS IV BOLUS
1000.0000 mL | Freq: Once | INTRAVENOUS | Status: AC
  Administered 2023-02-25: 1000 mL via INTRAVENOUS

## 2023-02-25 MED ORDER — SODIUM CHLORIDE 0.9 % IV SOLN
8.0000 mg | Freq: Once | INTRAVENOUS | Status: AC
  Administered 2023-02-25: 8 mg via INTRAVENOUS
  Filled 2023-02-25: qty 4

## 2023-02-25 MED ORDER — PANTOPRAZOLE SODIUM 40 MG IV SOLR
40.0000 mg | Freq: Once | INTRAVENOUS | Status: AC
  Administered 2023-02-25: 40 mg via INTRAVENOUS
  Filled 2023-02-25: qty 10

## 2023-02-25 NOTE — Progress Notes (Signed)
PATIENT CARE CENTER NOTE  Diagnosis: Hyperemesis gravidarum with electrolyte imbalance [O21.1]      Provider: Lyndel Safe MD   Procedure:  Fluid Bolus and Anti-emetics (dose #27 of 30)   Note: Patient received 1 Litre Lactated Ringers fluid bolus over 1 hour, IVPB Phenergan 12.5 mg, IVPB Zofran 8 mg, and IV Protonix 40 mg via PIV. Patient tolerated infusion and medication with no adverse reaction. Vital signs are stable. Pt can come twice a week for a total of 30 doses. Pt advised to schedule next appointment at front desk. AVS offered, but pt declined. Pt is alert, oriented, and ambulatory at discharge.

## 2023-02-27 ENCOUNTER — Ambulatory Visit: Payer: 59 | Admitting: Skilled Nursing Facility1

## 2023-03-03 ENCOUNTER — Observation Stay (HOSPITAL_COMMUNITY)
Admission: AD | Admit: 2023-03-03 | Discharge: 2023-03-05 | DRG: 833 | Disposition: A | Discharge status: Home / Self Care | Payer: 59 | Attending: Obstetrics & Gynecology | Admitting: Obstetrics & Gynecology

## 2023-03-03 ENCOUNTER — Inpatient Hospital Stay (HOSPITAL_BASED_OUTPATIENT_CLINIC_OR_DEPARTMENT_OTHER): Payer: 59

## 2023-03-03 ENCOUNTER — Telehealth: Payer: Self-pay | Admitting: *Deleted

## 2023-03-03 ENCOUNTER — Other Ambulatory Visit: Payer: Self-pay

## 2023-03-03 ENCOUNTER — Encounter (HOSPITAL_COMMUNITY): Payer: Self-pay | Admitting: Family Medicine

## 2023-03-03 DIAGNOSIS — O368930 Maternal care for other specified fetal problems, third trimester, not applicable or unspecified: Secondary | ICD-10-CM | POA: Diagnosis not present

## 2023-03-03 DIAGNOSIS — O99343 Other mental disorders complicating pregnancy, third trimester: Secondary | ICD-10-CM | POA: Diagnosis present

## 2023-03-03 DIAGNOSIS — O219 Vomiting of pregnancy, unspecified: Secondary | ICD-10-CM | POA: Diagnosis present

## 2023-03-03 DIAGNOSIS — O210 Mild hyperemesis gravidarum: Secondary | ICD-10-CM | POA: Diagnosis not present

## 2023-03-03 DIAGNOSIS — O09293 Supervision of pregnancy with other poor reproductive or obstetric history, third trimester: Secondary | ICD-10-CM | POA: Diagnosis not present

## 2023-03-03 DIAGNOSIS — O35EXX0 Maternal care for other (suspected) fetal abnormality and damage, fetal genitourinary anomalies, not applicable or unspecified: Secondary | ICD-10-CM | POA: Diagnosis not present

## 2023-03-03 DIAGNOSIS — O288 Other abnormal findings on antenatal screening of mother: Secondary | ICD-10-CM

## 2023-03-03 DIAGNOSIS — O99013 Anemia complicating pregnancy, third trimester: Secondary | ICD-10-CM

## 2023-03-03 DIAGNOSIS — O368330 Maternal care for abnormalities of the fetal heart rate or rhythm, third trimester, not applicable or unspecified: Principal | ICD-10-CM | POA: Diagnosis present

## 2023-03-03 DIAGNOSIS — F952 Tourette's disorder: Secondary | ICD-10-CM | POA: Diagnosis not present

## 2023-03-03 DIAGNOSIS — Z8616 Personal history of COVID-19: Secondary | ICD-10-CM

## 2023-03-03 DIAGNOSIS — Z3A32 32 weeks gestation of pregnancy: Secondary | ICD-10-CM

## 2023-03-03 DIAGNOSIS — O99283 Endocrine, nutritional and metabolic diseases complicating pregnancy, third trimester: Secondary | ICD-10-CM | POA: Diagnosis not present

## 2023-03-03 DIAGNOSIS — E079 Disorder of thyroid, unspecified: Secondary | ICD-10-CM

## 2023-03-03 DIAGNOSIS — D649 Anemia, unspecified: Secondary | ICD-10-CM

## 2023-03-03 DIAGNOSIS — O99213 Obesity complicating pregnancy, third trimester: Secondary | ICD-10-CM | POA: Diagnosis present

## 2023-03-03 LAB — CBC
HCT: 33.6 % — ABNORMAL LOW (ref 36.0–46.0)
Hemoglobin: 10.8 g/dL — ABNORMAL LOW (ref 12.0–15.0)
MCH: 27.8 pg (ref 26.0–34.0)
MCHC: 32.1 g/dL (ref 30.0–36.0)
MCV: 86.4 fL (ref 80.0–100.0)
Platelets: 463 10*3/uL — ABNORMAL HIGH (ref 150–400)
RBC: 3.89 MIL/uL (ref 3.87–5.11)
RDW: 15.5 % (ref 11.5–15.5)
WBC: 10.9 10*3/uL — ABNORMAL HIGH (ref 4.0–10.5)
nRBC: 0 % (ref 0.0–0.2)

## 2023-03-03 LAB — URINALYSIS, MICROSCOPIC (REFLEX)

## 2023-03-03 LAB — TYPE AND SCREEN
ABO/RH(D): O POS
Antibody Screen: NEGATIVE

## 2023-03-03 LAB — URINALYSIS, ROUTINE W REFLEX MICROSCOPIC
Glucose, UA: 100 mg/dL — AB
Ketones, ur: >80 mg/dL — AB
Nitrite: NEGATIVE
Protein, ur: 100 mg/dL — AB
Specific Gravity, Urine: 1.025 (ref 1.005–1.030)
pH: 7 (ref 5.0–8.0)

## 2023-03-03 MED ORDER — ACETAMINOPHEN 325 MG PO TABS
650.0000 mg | ORAL_TABLET | ORAL | Status: DC | PRN
  Administered 2023-03-05 (×2): 650 mg via ORAL
  Filled 2023-03-03 (×2): qty 2

## 2023-03-03 MED ORDER — LACTATED RINGERS IV BOLUS
1000.0000 mL | Freq: Once | INTRAVENOUS | Status: AC
  Administered 2023-03-03: 1000 mL via INTRAVENOUS

## 2023-03-03 MED ORDER — DIPHENHYDRAMINE HCL 50 MG/ML IJ SOLN
25.0000 mg | Freq: Once | INTRAMUSCULAR | Status: AC
  Administered 2023-03-03: 25 mg via INTRAVENOUS
  Filled 2023-03-03: qty 1

## 2023-03-03 MED ORDER — LACTATED RINGERS IV SOLN: 125.0000 mL/h | INTRAVENOUS | Status: DC

## 2023-03-03 MED ORDER — FAMOTIDINE IN NACL 20-0.9 MG/50ML-% IV SOLN
20.0000 mg | Freq: Once | INTRAVENOUS | Status: AC
  Administered 2023-03-03: 20 mg via INTRAVENOUS
  Filled 2023-03-03: qty 50

## 2023-03-03 MED ORDER — DOCUSATE SODIUM 100 MG PO CAPS
100.0000 mg | ORAL_CAPSULE | Freq: Every day | ORAL | Status: DC
  Administered 2023-03-04 – 2023-03-05 (×2): 100 mg via ORAL
  Filled 2023-03-03 (×2): qty 1

## 2023-03-03 MED ORDER — METOCLOPRAMIDE HCL 5 MG/ML IJ SOLN
10.0000 mg | Freq: Once | INTRAMUSCULAR | Status: AC
  Administered 2023-03-03: 10 mg via INTRAVENOUS
  Filled 2023-03-03: qty 2

## 2023-03-03 MED ORDER — PRENATAL MULTIVITAMIN CH
1.0000 | ORAL_TABLET | Freq: Every day | ORAL | Status: DC
  Administered 2023-03-04 – 2023-03-05 (×2): 1 via ORAL
  Filled 2023-03-03 (×2): qty 1

## 2023-03-03 MED ORDER — CALCIUM CARBONATE ANTACID 500 MG PO CHEW: 2.0000 | CHEWABLE_TABLET | ORAL | Status: DC | PRN

## 2023-03-03 NOTE — Telephone Encounter (Signed)
-----   Message from Ulysees Barns IV sent at 02/28/2023 12:47 PM EDT ----- Please let Mrs. Critzer know that her Hgb is stable at 10.3 and her iron levels are improved. There is no need for more IV iron at this time. She and the baby should have plenty of iron going into the delivery. ----- Message ----- From: Leory Plowman, [facility] [place] Sent: 02/20/2023  11:24 AM EDT To: Jaci Standard, MD

## 2023-03-03 NOTE — H&P (Signed)
FACULTY PRACTICE ANTEPARTUM ADMISSION HISTORY AND PHYSICAL NOTE   History of Present Illness: Jody Robinson is a 32 y.o. [id number] at [redacted]w[redacted]d admitted for non-reactive NST and repeat BPP. Patient presented to MAU for nausea, vomiting and headache. Patient has reported nausea and vomiting with a headache since yesterday. She reports she took Tylenol at 1400 yesterday which did help her headache, however headache returned and she has not tried anything to relieve her headache since. Headaches are aggravated by lights. She is suppose to follow up with neurology after she delivers the baby. Pregnancy has been complicated by hyperemesis in which she receives outpatient infusions. Nausea and vomiting are often aggravated by headaches. She last took antiemetics yesterday but none since. During MAU course, patient noted to have several variable decelerations after receiving IV Benadryl and Reglan. Patient was sent for a BPP which was 6/8, however NST remained non-reactive after BPP. Per MFM recommendations, repeat BPP tomorrow.   Patient reports the fetal movement as active. Patient reports uterine contraction  activity as none. Patient reports  vaginal bleeding as none. Patient describes fluid per vagina as None. Fetal presentation is  transverse .  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 09/17/2019   Thrombocytosis 08/16/2019    Past Medical History:  Diagnosis Date   Abnormal Pap smear of cervix    Acute prerenal failure due to hyperemesis and pyelnonephritis(HCC) 10/03/2022   Anemia in pregnancy 03/01/2020   S/p IV feraheme 6/18 and 02/04/2020  CBC Latest Ref Rng & Units 02/06/2020 01/19/2020 12/21/2019 WBC 4.0 - 10.5 K/uL 17.3(H) 13.8(H) 14.9(H) Hemoglobin 12.0 - 15.0 g/dL 7.8(L) 8.3(L) 8.5(L)  Hematocrit 36 - 46 % 32.7(L) 26.9(L) 26.7(L) Platelets 150 - 400 K/uL 483(H) 472(H) 559(H)     Depression    Depression    Elevated liver enzymes 10/13/2017   [ ]  rpt cmp with 28wk labs   Gallbladder sludge 08/22/2019   Herpes simplex 01/16/2013   History of 15wk loss (2020) 02/06/2019   [ ]  f/u antiphosphlipid abs future lab (placed 4/22)  Less than 20 weeks   History of abnormal cervical Pap smear 10/09/2017   2020: pap smear wnl   History of COVID-19 09/10/2019   COVID+ 09/08/19 symptomatic    History of herpes genitalis 10/09/2022   History of IUFD 02/06/2019   Less than 20 weeks   History of renal insufficiency 12/02/2022   Due to pyelo and dehydration but need to trend Cr   Hyperemesis gravidarum with electrolyte imbalance 10/03/2022   Twice weekly infusions-- 1L LR, Phenergan, Zofran, Protonix for 15 weeks of therapy written by Dr Alvester Morin 2.26   Will get CMP x 4 weekly      [ ]  oral potassium?   Migraine headache 11/24/2022   Sees neurologist for migraines who manages Tourette's   Migraine without aura and without status migrainosus, not intractable 05/10/2022   Pancreatitis, acute 08/20/2019   Seasonal allergies 10/09/2017   Thrombocytosis 08/16/2019   CBC  Latest Ref Rng & Units  02/06/2020  01/19/2020  12/21/2019  WBC  4.0 - 10.5 K/uL  17.3(H)  13.8(H)  14.9(H)  Hemoglobin  12.0 - 15.0 g/dL  3.8(B)  8.3(L)  8.5(L)  Hematocrit  36 - 46 %  32.7(L)  26.9(L)  26.7(L)  Platelets  150 - 400 K/uL  483(H)  472(H)  559(H)         Tourette's syndrome    Trichomoniasis     Past Surgical History:  Procedure Laterality Date   COLPOSCOPY      OB History  Gravida Para Term Preterm AB Living  4 2 2   1 2   SAB IAB Ectopic Multiple Live Births  1     0 2    # Outcome Date GA Lbr Len/2nd Weight Sex Type Anes PTL Lv  4 Current           3 Term 03/01/20 [redacted]w[redacted]d 03:23 / 00:15 3810 g F Vag-Spont EPI  LIV     Birth Comments: WDL   2 SAB 01/2019 [redacted]w[redacted]d         1 Term 03/08/13 [redacted]w[redacted]d  2495 g   Vag-Spont EPI  LIV     Birth Comments: no complications    Social History   Socioeconomic History   Marital status: Single    Spouse name: Not on file   Number of children: Not on file   Years of education: Not on file   Highest education level: Not on file  Occupational History   Not on file  Tobacco Use   Smoking status: Never   Smokeless tobacco: Never  Vaping Use   Vaping status: Never Used  Substance and Sexual Activity   Alcohol use: Not Currently    Comment: occasional   Drug use: Not Currently   Sexual activity: Yes    Birth control/protection: None  Other Topics Concern   Not on file  Social History Narrative   Not on file   Social Determinants of Health   Financial Resource Strain: Not on file  Food Insecurity: No Food Insecurity (07/17/2021)   Hunger Vital Sign    Worried About Running Out of Food in the Last Year: Never true    Ran Out of Food in the Last Year: Never true  Transportation Needs: No Transportation Needs (07/17/2021)   PRAPARE - Administrator, Civil Service (Medical): No    Lack of Transportation (Non-Medical): No  Physical Activity: Not on file  Stress: Not on file  Social Connections: Not on file    Family History  Problem Relation Age of Onset   Diabetes Mother     No Known Allergies  Medications Prior to Admission  Medication Sig Dispense Refill Last Dose   metoCLOPramide (REGLAN) 10 MG tablet Take 1 tablet (10 mg total) by mouth 4 (four) times daily. 30 tablet 2 03/02/2023   ondansetron (ZOFRAN-ODT) 4 MG disintegrating tablet Take 1 tablet (4 mg total) by mouth every 6 (six) hours as needed for nausea or vomiting. 30 tablet 3 03/02/2023   prenatal vitamin w/FE, FA (PRENATAL 1 + 1) 27-1 MG TABS tablet Take 1 tablet by mouth daily at 12 noon. 30 tablet 11 03/02/2023   acetaminophen (TYLENOL) 325 MG tablet Take 650 mg by mouth every 6 (six) hours as needed for mild pain or headache.      aspirin EC 81 MG tablet Take 1  tablet (81 mg total) by mouth daily. Swallow whole. 30 tablet 12    famotidine (PEPCID) 20 MG tablet Take 1 tablet (20 mg total) by mouth 2 (two) times daily. 60 tablet 2    promethazine (PHENERGAN) 25 MG tablet Take 1 tablet (25 mg total) by mouth every 6 (six) hours as needed for nausea, vomiting or refractory nausea / vomiting. 30 tablet 2    scopolamine (TRANSDERM-SCOP) 1  MG/3DAYS PLACE 1 PATCH ONTO THE SKIN EVERY 3 DAYS.      Review of Systems - History obtained from chart review and the patient  Vitals:  BP 98/73 (BP Location: Right Arm)   Pulse (!) 116   Temp 98.1 F (36.7 C) (Oral)   Resp 14   LMP 07/22/2022 (Exact Date)   SpO2 99%  Physical Examination: CONSTITUTIONAL: Well-developed, well-nourished female in no acute distress.  HENT:  Normocephalic, atraumatic, External right and left ear normal. Oropharynx is clear and moist EYES: Conjunctivae and EOM are normal. Pupils are equal, round, and reactive to light. No scleral icterus.  NECK: Normal range of motion, supple, no masses SKIN: Skin is warm and dry. No rash noted. Not diaphoretic. No erythema. No pallor. NEUROLGIC: Alert and oriented to person, place, and time. Normal reflexes, muscle tone coordination. No cranial nerve deficit noted. PSYCHIATRIC: Normal mood and affect. Normal behavior. Normal judgment and thought content. CARDIOVASCULAR: Normal heart rate RESPIRATORY: Effort normal, no problems with respiration noted ABDOMEN: Soft, nontender, nondistended, gravid. MUSCULOSKELETAL: Normal range of motion. No edema and no tenderness. 2+ distal pulses.  Cervix: Not evaluated. and found to be not evaluated fetal presentation is  transverse by [name] . Membranes:intact Fetal Monitoring: 155 bpm, minimal/moderate variability, +10x10 accels, +variable decels Tocometer: quiet  Labs:  Results for orders placed or performed during the hospital encounter of 03/03/23 (from the past 24 hour(s))  Urinalysis, Routine w reflex  microscopic -Urine, Clean Catch   Collection Time: 03/03/23  1:23 PM  Result Value Ref Range   Color, Urine YELLOW YELLOW   APPearance HAZY (A) CLEAR   Specific Gravity, Urine 1.025 1.005 - 1.030   pH 7.0 5.0 - 8.0   Glucose, UA 100 (A) NEGATIVE mg/dL   Hgb urine dipstick TRACE (A) NEGATIVE   Bilirubin Urine MODERATE (A) NEGATIVE   Ketones, ur >80 (A) NEGATIVE mg/dL   Protein, ur 865 (A) NEGATIVE mg/dL   Nitrite NEGATIVE NEGATIVE   Leukocytes,Ua MODERATE (A) NEGATIVE  Urinalysis, Microscopic (reflex)   Collection Time: 03/03/23  1:23 PM  Result Value Ref Range   RBC / HPF 0-5 0 - 5 RBC/hpf   WBC, UA 6-10 0 - 5 WBC/hpf   Bacteria, UA MANY (A) NONE SEEN   Squamous Epithelial / HPF 6-10 0 - 5 /HPF   Mucus PRESENT     Imaging Studies: [name] MFM FETAL BPP WO NON STRESS  Result Date: 03/03/2023 ----------------------------------------------------------------------  OBSTETRICS REPORT                       (Signed Final 03/03/2023 05:13 pm) ---------------------------------------------------------------------- Patient Info  ID #:       [id number]                          D.O.B.:  11/01/1990 (32 yrs)  Name:       Jody Robinson               Visit Date: 03/03/2023 04:15 pm ---------------------------------------------------------------------- Performed By  Attending:        Braxton Feathers DO       Ref. Address:     [address]  [place], [place]                                                             [address]  Performed By:     Percell Boston          Secondary Phy.:   [facility] MAU/Triage                    RDMS  Referred By:      [facility]          [facility] ---------------------------------------------------------------------- Orders  #  Description                           Code        Ordered By  1  [name] MFM FETAL BPP WO NON               19147.82     Marcio Hoque     STRESS ----------------------------------------------------------------------  #  Order #                     Accession #                Episode #  1  [id number]                   [id number]                 [id number] ---------------------------------------------------------------------- Indications  Variable fetal heart rate decelerations,       O36.8990  antepartum  [redacted] weeks gestation of pregnancy                Z3A.32  Medical complication of pregnancy              O26.90  (Thrombocytosis)  Poor obstetric history: Previous midtrimester  O09.299  loss (15 week SAB)  Thyroid disease (hyperthyroidism) in           O99.280, E07.9  pregnancy  Fetal urinary tract dilation, right-sided, mildO35.8XX0  Neg AFP/ LR NIPS/ Neg Horizon (2021)  Anemia during pregnancy in third trimester     O99.013 ---------------------------------------------------------------------- Fetal Evaluation  Num Of Fetuses:         1  Fetal Heart Rate(bpm):  148  Cardiac Activity:       Observed  Presentation:           Transverse, head to maternal left  Placenta:               Posterior  P. Cord Insertion:      Previously visualized  Amniotic Fluid  AFI FV:      Within normal limits  AFI Sum(cm)     %Tile       Largest Pocket(cm)  11.2            25  4.1  RUQ(cm)       RLQ(cm)       LUQ(cm)        LLQ(cm)  2             4.1           3.7            1.4 ---------------------------------------------------------------------- Biophysical Evaluation  Amniotic F.V:   Within normal limits       F. Tone:        Observed  F. Movement:    Observed                   Score:          6/8  F. Breathing:   Not Observed ---------------------------------------------------------------------- OB History  Gravidity:    4         Term:   2        Prem:   0        SAB:   1  TOP:          0       Ectopic:  0        Living: 2 ---------------------------------------------------------------------- Gestational Age  LMP:           32w 0d         Date:  07/22/22                 EDD:   04/28/23  Best:          Armida Sans 0d     Det. By:  LMP  (07/22/22)          EDD:   04/28/23 ---------------------------------------------------------------------- Anatomy  Cranium:               Appears normal         Stomach:                Appears normal, left                                                                        sided  Thoracic:              Appears normal         Kidneys:                Appear normal  Diaphragm:             Appears normal         Bladder:                Appears normal ---------------------------------------------------------------------- Cervix Uterus Adnexa  Cervix  Not visualized (advanced GA >24wks) ---------------------------------------------------------------------- Comments  Hospital Ultrasound  32w 0d at the MAU for nausea and vomitting. EDD:  04/28/2023 by LMP  (07/22/22).  Sonographic findings  Single intrauterine pregnancy.  Fetal cardiac activity: Observed and appears normal.  Presentation: Transverse, head to maternal left.  Limited fetal anatomy appears normal.  Amniotic fluid volume: Within normal limits. AFI: 11.2 cm.  MVP: 4.1 cm.  Placenta: Posterior.  BPP: 6/8.  Recommendations  - Since the BPP is 6/8, and NST should be performed. If the  NST is reactive, then the complete BPP  is 8/10 indicating a  low risk of stillbirth. If total BPP is 6/10, a repeat BPP should  occur within 24 hours of the first BPP.  - Continue clinical management per [facility] provider.  This was a limited ultrasound with a remote read. If an official  MFM consult is requested for any reason please call/place an  order in Epic. ----------------------------------------------------------------------                  Braxton Feathers, DO Electronically Signed Final Report   03/03/2023 05:13 pm ----------------------------------------------------------------------     Assessment and Plan: 1. [redacted] weeks gestation of pregnancy   2. NST (non-stress test) nonreactive   3.  Hyperemesis affecting pregnancy, antepartum   4. Tourette's syndrome    - D/w Dr. Adrian Blackwater, will admit to [facility] and repeat BPP tomorrow - Routine antenatal care. Orders placed by MD    Brand Males, CNM 03/03/23, 7:34 PM

## 2023-03-03 NOTE — MAU Note (Signed)
.  Jody Robinson is a 32 y.o. at [redacted]w[redacted]d here in MAU reporting: yesterday she started having intermittent sharp abdominal pain in her RLQ. She has also been throwing up since Saturday. Last time she threw up was this morning. Denies VB or LOF. +FM. Also reporting a headache.   Pain score: head- 6 abdomen- 7 Vitals:   03/03/23 1258  BP: 98/73  Pulse: (!) 116  Resp: 14  Temp: 98.1 F (36.7 C)  SpO2: 99%     FHT:140 Lab orders placed from triage:  UA

## 2023-03-03 NOTE — Telephone Encounter (Signed)
TCT patient regarding recent lab results. No answer. Unable to leave vm message on unidentified vm. Pt is currently admitted to the MAU. Per Dr. Leonides Schanz, her Hgb is stable at 10.3 and her iron levels are improved. There is no need for more IV iron at this time. She and the baby should have plenty of iron going into the delivery.

## 2023-03-04 ENCOUNTER — Encounter: Payer: 59 | Admitting: Family Medicine

## 2023-03-04 ENCOUNTER — Observation Stay (HOSPITAL_BASED_OUTPATIENT_CLINIC_OR_DEPARTMENT_OTHER): Payer: 59

## 2023-03-04 ENCOUNTER — Inpatient Hospital Stay (HOSPITAL_COMMUNITY)
Admission: RE | Admit: 2023-03-04 | Discharge: 2023-03-04 | Disposition: A | Discharge status: Home / Self Care | Payer: 59 | Source: Ambulatory Visit

## 2023-03-04 DIAGNOSIS — E079 Disorder of thyroid, unspecified: Secondary | ICD-10-CM

## 2023-03-04 DIAGNOSIS — O219 Vomiting of pregnancy, unspecified: Secondary | ICD-10-CM | POA: Diagnosis not present

## 2023-03-04 DIAGNOSIS — D75839 Thrombocytosis, unspecified: Secondary | ICD-10-CM | POA: Diagnosis not present

## 2023-03-04 DIAGNOSIS — Z3A32 32 weeks gestation of pregnancy: Secondary | ICD-10-CM

## 2023-03-04 DIAGNOSIS — O99113 Other diseases of the blood and blood-forming organs and certain disorders involving the immune mechanism complicating pregnancy, third trimester: Secondary | ICD-10-CM | POA: Diagnosis not present

## 2023-03-04 DIAGNOSIS — O283 Abnormal ultrasonic finding on antenatal screening of mother: Secondary | ICD-10-CM

## 2023-03-04 DIAGNOSIS — O99283 Endocrine, nutritional and metabolic diseases complicating pregnancy, third trimester: Secondary | ICD-10-CM

## 2023-03-04 DIAGNOSIS — O35EXX0 Maternal care for other (suspected) fetal abnormality and damage, fetal genitourinary anomalies, not applicable or unspecified: Secondary | ICD-10-CM

## 2023-03-04 DIAGNOSIS — O09293 Supervision of pregnancy with other poor reproductive or obstetric history, third trimester: Secondary | ICD-10-CM | POA: Diagnosis not present

## 2023-03-04 DIAGNOSIS — D649 Anemia, unspecified: Secondary | ICD-10-CM

## 2023-03-04 DIAGNOSIS — O99013 Anemia complicating pregnancy, third trimester: Secondary | ICD-10-CM

## 2023-03-04 LAB — CULTURE, OB URINE: Culture: <10000 — AB

## 2023-03-04 MED ORDER — SODIUM CHLORIDE 0.9% FLUSH
10.0000 mL | Freq: Two times a day (BID) | INTRAVENOUS | Status: DC
  Administered 2023-03-04: 10 mL

## 2023-03-04 MED ORDER — KCL-LACTATED RINGERS-D5W 20 MEQ/L IV SOLN
INTRAVENOUS | Status: DC
  Filled 2023-03-04 (×5): qty 1000

## 2023-03-04 MED ORDER — CYCLOBENZAPRINE HCL 10 MG PO TABS: 10.0000 mg | ORAL_TABLET | Freq: Three times a day (TID) | ORAL | Status: DC | PRN

## 2023-03-04 MED ORDER — LACTATED RINGERS IV BOLUS
1000.0000 mL | Freq: Once | INTRAVENOUS | Status: AC
  Administered 2023-03-04: 1000 mL via INTRAVENOUS

## 2023-03-04 MED ORDER — SODIUM CHLORIDE 0.9% FLUSH: 10.0000 mL | INTRAVENOUS | Status: DC | PRN

## 2023-03-04 MED ORDER — FAMOTIDINE 20 MG PO TABS
40.0000 mg | ORAL_TABLET | Freq: Every day | ORAL | Status: DC
  Administered 2023-03-04 – 2023-03-05 (×2): 40 mg via ORAL
  Filled 2023-03-04 (×2): qty 2

## 2023-03-04 MED ORDER — ONDANSETRON HCL 4 MG/2ML IJ SOLN
4.0000 mg | Freq: Four times a day (QID) | INTRAMUSCULAR | Status: DC | PRN
  Administered 2023-03-04 – 2023-03-05 (×2): 4 mg via INTRAVENOUS
  Filled 2023-03-04 (×2): qty 2

## 2023-03-04 NOTE — Progress Notes (Signed)
Patient ID: Jody Robinson, female   DOB: 1990-12-11, 32 y.o.   MRN: [id number] FACULTY PRACTICE ANTEPARTUM(COMPREHENSIVE) NOTE  Jody Robinson is a 32 y.o. [id number] with Estimated Date of Delivery: 04/28/23   By  early ultrasound [redacted]w[redacted]d  who is admitted for [facility] 6/8.    Fetal presentation is cephalic. Length of Stay:  1  Days  Date of admission:03/03/2023  Subjective: Pt states her headache has resolved Some nausea no emesis since admission Patient reports the fetal movement as active. Patient reports uterine contraction  activity as none. Patient reports  vaginal bleeding as none. Patient describes fluid per vagina as None.  Vitals:  Blood pressure 102/62, pulse 80, temperature 97.9 F (36.6 C), temperature source Oral, resp. rate 18, height 5\' 5"  (1.651 m), weight 67.3 kg, last menstrual period 07/22/2022, SpO2 100%. Vitals:   03/03/23 1258 03/03/23 1934 03/04/23 0352 03/04/23 0756  BP: 98/73 (!) 99/57 101/68 102/62  Pulse: (!) 116 69 95 80  Resp: 14 16 16 18   Temp: 98.1 F (36.7 C) (!) 97.5 F (36.4 C) 97.9 F (36.6 C) 97.9 F (36.6 C)  TempSrc: Oral Oral Oral Oral  SpO2: 99% 100% 100% 100%  Weight:  67.3 kg    Height:  5\' 5"  (1.651 m)     Physical Examination:  General appearance - alert, well appearing, and in no distress Abdomen - soft, nontender, nondistended, no masses or organomegaly Fundal Height:  size equals dates Pelvic Exam:  examination not indicated Cervical Exam: Not evaluated Extremities: extremities normal, atraumatic, no cyanosis or edema with DTRs 2+ bilaterally Membranes:intact  Fetal Monitoring:  Baseline: 130s bpm, Variability: Good {> 6 bpm), Accelerations: Reactive, and Decelerations: Absent   reactive  Labs:  Results for orders placed or performed during the hospital encounter of 03/03/23 (from the past 24 hour(s))  Urinalysis, Routine w reflex microscopic -Urine, Clean Catch   Collection Time: 03/03/23  1:23 PM  Result Value Ref Range    Color, Urine YELLOW YELLOW   APPearance HAZY (A) CLEAR   Specific Gravity, Urine 1.025 1.005 - 1.030   pH 7.0 5.0 - 8.0   Glucose, UA 100 (A) NEGATIVE mg/dL   Hgb urine dipstick TRACE (A) NEGATIVE   Bilirubin Urine MODERATE (A) NEGATIVE   Ketones, ur >80 (A) NEGATIVE mg/dL   Protein, ur 696 (A) NEGATIVE mg/dL   Nitrite NEGATIVE NEGATIVE   Leukocytes,Ua MODERATE (A) NEGATIVE  Urinalysis, Microscopic (reflex)   Collection Time: 03/03/23  1:23 PM  Result Value Ref Range   RBC / HPF 0-5 0 - 5 RBC/hpf   WBC, UA 6-10 0 - 5 WBC/hpf   Bacteria, UA MANY (A) NONE SEEN   Squamous Epithelial / HPF 6-10 0 - 5 /HPF   Mucus PRESENT   CBC   Collection Time: 03/03/23  8:15 PM  Result Value Ref Range   WBC 10.9 (H) 4.0 - 10.5 K/uL   RBC 3.89 3.87 - 5.11 MIL/uL   Hemoglobin 10.8 (L) 12.0 - 15.0 g/dL   HCT 29.5 (L) 28.4 - 13.2 %   MCV 86.4 80.0 - 100.0 fL   MCH 27.8 26.0 - 34.0 pg   MCHC 32.1 30.0 - 36.0 g/dL   RDW 44.0 10.2 - 72.5 %   Platelets 463 (H) 150 - 400 K/uL   nRBC 0.0 0.0 - 0.2 %  Type and screen [facility]   Collection Time: 03/03/23  8:15 PM  Result Value Ref Range   ABO/RH(D) O POS  Antibody Screen NEG    Sample Expiration      03/06/2023,2359 Performed at [facility], [address] [place], [place] [address]     Imaging Studies:    [name] MFM FETAL BPP WO NON STRESS  Result Date: 03/03/2023 ----------------------------------------------------------------------  OBSTETRICS REPORT                       (Signed Final 03/03/2023 05:13 pm) ---------------------------------------------------------------------- Patient Info  ID #:       [id number]                          D.O.B.:  08-08-1991 (32 yrs)  Name:       Jody Robinson               Visit Date: 03/03/2023 04:15 pm ---------------------------------------------------------------------- Performed By  Attending:        Braxton Feathers DO       Ref. Address:     [address]                                                              [place], [place]                                                             [address]  Performed By:     Percell Boston          Secondary Phy.:   [facility] MAU/Triage                    RDMS  Referred By:      [facility]          [facility] ---------------------------------------------------------------------- Orders  #  Description                           Code        Ordered By  1  [name] MFM FETAL BPP WO NON               84696.29    DANIELLE SIMPSON     STRESS ----------------------------------------------------------------------  #  Order #                     Accession #                Episode #  1  [id number]                   [id number]                 [id number] ---------------------------------------------------------------------- Indications  Variable fetal heart rate  decelerations,       O36.8990  antepartum  [redacted] weeks gestation of pregnancy                Z3A.32  Medical complication of pregnancy              O26.90  (Thrombocytosis)  Poor obstetric history: Previous midtrimester  O09.299  loss (15 week SAB)  Thyroid disease (hyperthyroidism) in           O99.280, E07.9  pregnancy  Fetal urinary tract dilation, right-sided, mildO35.8XX0  Neg AFP/ LR NIPS/ Neg Horizon (2021)  Anemia during pregnancy in third trimester     O99.013 ---------------------------------------------------------------------- Fetal Evaluation  Num Of Fetuses:         1  Fetal Heart Rate(bpm):  148  Cardiac Activity:       Observed  Presentation:           Transverse, head to maternal left  Placenta:               Posterior  P. Cord Insertion:      Previously visualized  Amniotic Fluid  AFI FV:      Within normal limits  AFI Sum(cm)     %Tile       Largest Pocket(cm)  11.2            25          4.1  RUQ(cm)       RLQ(cm)       LUQ(cm)        LLQ(cm)  2             4.1           3.7            1.4  ---------------------------------------------------------------------- Biophysical Evaluation  Amniotic F.V:   Within normal limits       F. Tone:        Observed  F. Movement:    Observed                   Score:          6/8  F. Breathing:   Not Observed ---------------------------------------------------------------------- OB History  Gravidity:    4         Term:   2        Prem:   0        SAB:   1  TOP:          0       Ectopic:  0        Living: 2 ---------------------------------------------------------------------- Gestational Age  LMP:           32w 0d        Date:  07/22/22                 EDD:   04/28/23  Best:          Armida Sans 0d     Det. By:  LMP  (07/22/22)          EDD:   04/28/23 ---------------------------------------------------------------------- Anatomy  Cranium:               Appears normal         Stomach:                Appears normal, left  sided  Thoracic:              Appears normal         Kidneys:                Appear normal  Diaphragm:             Appears normal         Bladder:                Appears normal ---------------------------------------------------------------------- Cervix Uterus Adnexa  Cervix  Not visualized (advanced GA >24wks) ---------------------------------------------------------------------- Comments  Hospital Ultrasound  32w 0d at the MAU for nausea and vomitting. EDD:  04/28/2023 by LMP  (07/22/22).  Sonographic findings  Single intrauterine pregnancy.  Fetal cardiac activity: Observed and appears normal.  Presentation: Transverse, head to maternal left.  Limited fetal anatomy appears normal.  Amniotic fluid volume: Within normal limits. AFI: 11.2 cm.  MVP: 4.1 cm.  Placenta: Posterior.  BPP: 6/8.  Recommendations  - Since the BPP is 6/8, and NST should be performed. If the  NST is reactive, then the complete BPP is 8/10 indicating a  low risk of stillbirth. If total BPP is 6/10, a repeat BPP should   occur within 24 hours of the first BPP.  - Continue clinical management per [facility] provider.  This was a limited ultrasound with a remote read. If an official  MFM consult is requested for any reason please call/place an  order in Epic. ----------------------------------------------------------------------                  Braxton Feathers, DO Electronically Signed Final Report   03/03/2023 05:13 pm ----------------------------------------------------------------------     Medications:  Scheduled  docusate sodium  100 mg Oral Daily   prenatal multivitamin  1 tablet Oral Q1200   sodium chloride flush  10-40 mL Intracatheter Q12H   I have reviewed the patient's current medications.  ASSESSMENT: [id number] [redacted]w[redacted]d Estimated Date of Delivery: 04/28/23  Patient Active Problem List   Diagnosis Date Noted   Nausea and vomiting of pregnancy, antepartum 03/04/2023   Obesity affecting pregnancy 11/25/2022   Supervision of high risk pregnancy, antepartum 10/08/2022   Tourette's disease 05/10/2022   Hyperthyroidism affecting pregnancy in second trimester 09/17/2019   Thrombocytosis 08/16/2019    PLAN: >BPP 8/8 this am with reactive NST 10/10 >headache has resolved >continue IVF with history of chronic dehydration during the pregnancy requiring intermittent IV fluid infusions >continue pepcid >begin flexeril for headache as the source seems to be musculoskeletal from the neck   [employer] 03/04/2023,10:53 AM

## 2023-03-04 NOTE — Progress Notes (Signed)
Patient ID: Jody Robinson, female   DOB: [birth year], 32 y.o.   MRN: [id number] FACULTY PRACTICE ANTEPARTUM(COMPREHENSIVE) NOTE  Jody Robinson is a 32 y.o. [id number] at [redacted]w[redacted]d by LMP who is admitted to monitor and repeat BPP after non-reactive NST.   Fetal presentation is  transverse . Length of Stay:  1  Days  Subjective: Hungry, no headache Patient reports the fetal movement as active. Patient reports uterine contraction  activity as none. Patient reports  vaginal bleeding as none Patient describes fluid per vagina as None.  Vitals:  Blood pressure 101/68, pulse 95, temperature 97.9 F (36.6 C), temperature source Oral, resp. rate 16, height 5\' 5"  (1.651 m), weight 67.3 kg, last menstrual period 07/22/2022, SpO2 100%. Physical Examination:  General appearance - alert, well appearing, and in no distress Heart - normal rate and regular rhythm Abdomen - soft, nontender, nondistended Fundal Height:  size equals dates. Extremities: extremities normal, atraumatic, no cyanosis or edema and Homans sign is negative, no sign of DVT Membranes:intact  Fetal Monitoring:   Fetal Heart Rate A   Mode External filed at 03/04/2023 0700  Baseline Rate (A) 135 bpm filed at 03/04/2023 0700  Variability 6-25 BPM filed at 03/04/2023 0700  Accelerations 15 x 15 filed at 03/04/2023 0700  Decelerations None filed at 03/04/2023 0700    Labs:  Results for orders placed or performed during the hospital encounter of 03/03/23 (from the past 24 hour(s))  Urinalysis, Routine w reflex microscopic -Urine, Clean Catch   Collection Time: 03/03/23  1:23 PM  Result Value Ref Range   Color, Urine YELLOW YELLOW   APPearance HAZY (A) CLEAR   Specific Gravity, Urine 1.025 1.005 - 1.030   pH 7.0 5.0 - 8.0   Glucose, UA 100 (A) NEGATIVE mg/dL   Hgb urine dipstick TRACE (A) NEGATIVE   Bilirubin Urine MODERATE (A) NEGATIVE   Ketones, ur >80 (A) NEGATIVE mg/dL   Protein, ur 130 (A) NEGATIVE mg/dL   Nitrite  NEGATIVE NEGATIVE   Leukocytes,Ua MODERATE (A) NEGATIVE  Urinalysis, Microscopic (reflex)   Collection Time: 03/03/23  1:23 PM  Result Value Ref Range   RBC / HPF 0-5 0 - 5 RBC/hpf   WBC, UA 6-10 0 - 5 WBC/hpf   Bacteria, UA MANY (A) NONE SEEN   Squamous Epithelial / HPF 6-10 0 - 5 /HPF   Mucus PRESENT   CBC   Collection Time: 03/03/23  8:15 PM  Result Value Ref Range   WBC 10.9 (H) 4.0 - 10.5 K/uL   RBC 3.89 3.87 - 5.11 MIL/uL   Hemoglobin 10.8 (L) 12.0 - 15.0 g/dL   HCT 86.5 (L) 78.4 - 69.6 %   MCV 86.4 80.0 - 100.0 fL   MCH 27.8 26.0 - 34.0 pg   MCHC 32.1 30.0 - 36.0 g/dL   RDW 29.5 28.4 - 13.2 %   Platelets 463 (H) 150 - 400 K/uL   nRBC 0.0 0.0 - 0.2 %  Type and screen [facility]   Collection Time: 03/03/23  8:15 PM  Result Value Ref Range   ABO/RH(D) O POS    Antibody Screen NEG    Sample Expiration      03/06/2023,2359 Performed at [facility], [address] [place], [place] [address]      Medications:  Scheduled  docusate sodium  100 mg Oral Daily   prenatal multivitamin  1 tablet Oral Q1200   I have reviewed the patient's current medications.  ASSESSMENT: Patient Active  Problem List   Diagnosis Date Noted   Nausea and vomiting of pregnancy, antepartum 03/04/2023   Obesity affecting pregnancy 11/25/2022   Supervision of high risk pregnancy, antepartum 10/08/2022   Tourette's disease 05/10/2022   Hyperthyroidism affecting pregnancy in second trimester 09/17/2019   Thrombocytosis 08/16/2019    PLAN: Repeat BPP this morning and assess for discharge based on result  Scheryl Darter 03/04/2023,7:34 AM

## 2023-03-04 NOTE — Progress Notes (Signed)

## 2023-03-05 ENCOUNTER — Other Ambulatory Visit (HOSPITAL_COMMUNITY): Payer: Self-pay

## 2023-03-05 DIAGNOSIS — Z3A32 32 weeks gestation of pregnancy: Secondary | ICD-10-CM | POA: Diagnosis not present

## 2023-03-05 DIAGNOSIS — O219 Vomiting of pregnancy, unspecified: Secondary | ICD-10-CM | POA: Diagnosis not present

## 2023-03-05 MED ORDER — FAMOTIDINE 40 MG PO TABS
40.0000 mg | ORAL_TABLET | Freq: Two times a day (BID) | ORAL | 2 refills | Status: DC
  Filled 2023-03-05: qty 60, 30d supply, fill #0

## 2023-03-05 MED ORDER — FAMOTIDINE 40 MG PO TABS
40.0000 mg | ORAL_TABLET | Freq: Two times a day (BID) | ORAL | 1 refills | Status: DC
Start: 2023-03-05 — End: 2023-04-01
  Filled 2023-03-05: qty 60, 30d supply, fill #0

## 2023-03-05 MED ORDER — BISACODYL 10 MG RE SUPP
10.0000 mg | Freq: Once | RECTAL | Status: AC
  Administered 2023-03-05: 10 mg via RECTAL
  Filled 2023-03-05: qty 1

## 2023-03-05 NOTE — Discharge Summary (Signed)
Physician Discharge Summary  Patient ID: Jody Robinson MRN: [id number] DOB/AGE: 01/08/1991 32 y.o.  Admit date: 03/03/2023 Discharge date: 03/05/2023  Admission Diagnoses: BPP 6/8 with equivocal NST Chronic Hyperemesis Headache  Discharge Diagnoses:  Principal Problem:   Nausea and vomiting of pregnancy, antepartum  BPP 10/10 on recheck  Discharged Condition: good  Hospital Course: admitted with 6/10 BPP She was a bit dehydrated After fluids the next am the baby had a 10/10 BPP ^pepcid to 40 BID which has helped nausea Headache has resolved Discharged with reactive NST next day as well  Consults:   Significant Diagnostic Studies: BPP + NST  Treatments: IV hydration  Discharge Exam: Blood pressure 122/66, pulse 75, temperature 97.9 F (36.6 C), temperature source Oral, resp. rate 19, height 5\' 5"  (1.651 m), weight 67.3 kg, last menstrual period 07/22/2022, SpO2 100%. General appearance: alert, cooperative, and no distress GI: soft, non-tender; bowel sounds normal; no masses,  no organomegaly  SVE per pt request LTC with large amount of stool in the vault  Disposition: Discharge disposition: 01-Home or Self Care       Discharge Instructions     Call MD for:  persistant nausea and vomiting   Complete by: As directed    Call MD for:  severe uncontrolled pain   Complete by: As directed    Call MD for:  temperature >100.4   Complete by: As directed    Diet - low sodium heart healthy   Complete by: As directed    Increase activity slowly   Complete by: As directed       Allergies as of 03/05/2023   No Known Allergies      Medication List     TAKE these medications    acetaminophen 325 MG tablet Commonly known as: TYLENOL Take 650 mg by mouth every 6 (six) hours as needed for mild pain or headache.   aspirin EC 81 MG tablet Take 1 tablet (81 mg total) by mouth daily. Swallow whole.   famotidine 40 MG tablet Commonly known as: Pepcid Take 1  tablet (40 mg total) by mouth 2 (two) times daily. What changed:  medication strength how much to take   famotidine 40 MG tablet Commonly known as: PEPCID Take 1 tablet (40 mg total) by mouth 2 (two) times daily. What changed: You were already taking a medication with the same name, and this prescription was added. Make sure you understand how and when to take each.   metoCLOPramide 10 MG tablet Commonly known as: Reglan Take 1 tablet (10 mg total) by mouth 4 (four) times daily.   ondansetron 4 MG disintegrating tablet Commonly known as: ZOFRAN-ODT Take 1 tablet (4 mg total) by mouth every 6 (six) hours as needed for nausea or vomiting.   prenatal vitamin w/FE, FA 27-1 MG Tabs tablet Take 1 tablet by mouth daily at 12 noon.   promethazine 25 MG tablet Commonly known as: PHENERGAN Take 1 tablet (25 mg total) by mouth every 6 (six) hours as needed for nausea, vomiting or refractory nausea / vomiting.   scopolamine 1 MG/3DAYS Commonly known as: TRANSDERM-SCOP PLACE 1 PATCH ONTO THE SKIN EVERY 3 DAYS.        Follow-up Information     [facility] Follow up in 1 week(s).   Specialty: Obstetrics and Gynecology Why: ob visit Contact information: [address] [place] [address] [phone]  SignedLazaro Arms 03/05/2023, 1:17 PM

## 2023-03-05 NOTE — Progress Notes (Signed)
   03/05/23 1533  Departure Condition  Departure Condition Good  Mobility at [facility]  Patient/Caregiver Teaching Teach Back Method Used;Discharge instructions reviewed;Prescriptions reviewed;Follow-up care reviewed;Patient/caregiver verbalized understanding;Medications discussed;Pain management discussed  Departure Mode By self  Was procedural sedation performed on this patient during this visit? No   Patient alert and oriented x4, VS and pain stable.

## 2023-03-05 NOTE — Plan of Care (Signed)

## 2023-03-07 ENCOUNTER — Other Ambulatory Visit: Payer: Self-pay

## 2023-03-07 ENCOUNTER — Encounter (HOSPITAL_COMMUNITY): Payer: Self-pay | Admitting: Obstetrics and Gynecology

## 2023-03-07 ENCOUNTER — Encounter: Payer: Self-pay | Admitting: Obstetrics & Gynecology

## 2023-03-07 ENCOUNTER — Inpatient Hospital Stay (HOSPITAL_COMMUNITY)
Admission: AD | Admit: 2023-03-07 | Discharge: 2023-03-07 | Disposition: A | Discharge status: Home / Self Care | Payer: 59 | Attending: Obstetrics and Gynecology | Admitting: Obstetrics and Gynecology

## 2023-03-07 DIAGNOSIS — O212 Late vomiting of pregnancy: Secondary | ICD-10-CM | POA: Insufficient documentation

## 2023-03-07 DIAGNOSIS — R109 Unspecified abdominal pain: Secondary | ICD-10-CM | POA: Diagnosis not present

## 2023-03-07 DIAGNOSIS — R1013 Epigastric pain: Secondary | ICD-10-CM | POA: Diagnosis present

## 2023-03-07 DIAGNOSIS — F952 Tourette's disorder: Secondary | ICD-10-CM | POA: Insufficient documentation

## 2023-03-07 DIAGNOSIS — Z3A32 32 weeks gestation of pregnancy: Secondary | ICD-10-CM | POA: Diagnosis not present

## 2023-03-07 DIAGNOSIS — O99343 Other mental disorders complicating pregnancy, third trimester: Secondary | ICD-10-CM | POA: Insufficient documentation

## 2023-03-07 DIAGNOSIS — O26899 Other specified pregnancy related conditions, unspecified trimester: Secondary | ICD-10-CM

## 2023-03-07 DIAGNOSIS — O26893 Other specified pregnancy related conditions, third trimester: Secondary | ICD-10-CM | POA: Diagnosis present

## 2023-03-07 DIAGNOSIS — O0990 Supervision of high risk pregnancy, unspecified, unspecified trimester: Secondary | ICD-10-CM

## 2023-03-07 DIAGNOSIS — R1110 Vomiting, unspecified: Secondary | ICD-10-CM | POA: Diagnosis present

## 2023-03-07 DIAGNOSIS — Z3689 Encounter for other specified antenatal screening: Secondary | ICD-10-CM

## 2023-03-07 LAB — COMPREHENSIVE METABOLIC PANEL
ALT: 17 U/L (ref 0–44)
AST: 24 U/L (ref 15–41)
Albumin: 3 g/dL — ABNORMAL LOW (ref 3.5–5.0)
Alkaline Phosphatase: 140 U/L — ABNORMAL HIGH (ref 38–126)
Anion gap: 19 — ABNORMAL HIGH (ref 5–15)
BUN: 5 mg/dL — ABNORMAL LOW (ref 6–20)
CO2: 19 mmol/L — ABNORMAL LOW (ref 22–32)
Calcium: 8.6 mg/dL — ABNORMAL LOW (ref 8.9–10.3)
Chloride: 101 mmol/L (ref 98–111)
Creatinine, Ser: 0.68 mg/dL (ref 0.44–1.00)
GFR, Estimated: >60 mL/min (ref >60)
Glucose, Bld: 76 mg/dL (ref 70–99)
Potassium: 3.2 mmol/L — ABNORMAL LOW (ref 3.5–5.1)
Sodium: 139 mmol/L (ref 135–145)
Total Bilirubin: 1.7 mg/dL — ABNORMAL HIGH (ref 0.3–1.2)
Total Protein: 6.8 g/dL (ref 6.5–8.1)

## 2023-03-07 LAB — URINALYSIS, ROUTINE W REFLEX MICROSCOPIC
Bilirubin Urine: NEGATIVE
Glucose, UA: NEGATIVE mg/dL
Hgb urine dipstick: NEGATIVE
Ketones, ur: 80 mg/dL — AB
Leukocytes,Ua: NEGATIVE
Nitrite: NEGATIVE
Protein, ur: 100 mg/dL — AB
Specific Gravity, Urine: 1.024 (ref 1.005–1.030)
pH: 6 (ref 5.0–8.0)

## 2023-03-07 LAB — CBC
HCT: 37.5 % (ref 36.0–46.0)
Hemoglobin: 12 g/dL (ref 12.0–15.0)
MCH: 26.9 pg (ref 26.0–34.0)
MCHC: 32 g/dL (ref 30.0–36.0)
MCV: 84.1 fL (ref 80.0–100.0)
Platelets: 490 10*3/uL — ABNORMAL HIGH (ref 150–400)
RBC: 4.46 MIL/uL (ref 3.87–5.11)
RDW: 15.2 % (ref 11.5–15.5)
WBC: 11 10*3/uL — ABNORMAL HIGH (ref 4.0–10.5)
nRBC: 0 % (ref 0.0–0.2)

## 2023-03-07 LAB — LIPASE, BLOOD: Lipase: 37 U/L (ref 11–51)

## 2023-03-07 MED ORDER — FAMOTIDINE IN NACL 20-0.9 MG/50ML-% IV SOLN
20.0000 mg | Freq: Once | INTRAVENOUS | Status: AC
  Administered 2023-03-07: 20 mg via INTRAVENOUS
  Filled 2023-03-07: qty 50

## 2023-03-07 MED ORDER — LACTATED RINGERS IV BOLUS
1000.0000 mL | Freq: Once | INTRAVENOUS | Status: AC
  Administered 2023-03-07: 1000 mL via INTRAVENOUS

## 2023-03-07 MED ORDER — SODIUM CHLORIDE 0.9 % IV SOLN
8.0000 mg | Freq: Once | INTRAVENOUS | Status: AC
  Administered 2023-03-07: 8 mg via INTRAVENOUS
  Filled 2023-03-07: qty 4

## 2023-03-07 MED ORDER — METOCLOPRAMIDE HCL 5 MG/ML IJ SOLN
10.0000 mg | Freq: Once | INTRAMUSCULAR | Status: AC
  Administered 2023-03-07: 10 mg via INTRAVENOUS
  Filled 2023-03-07: qty 2

## 2023-03-07 MED ORDER — FAMOTIDINE 20 MG PO TABS: 20.0000 mg | ORAL_TABLET | Freq: Once | ORAL | Status: DC

## 2023-03-07 NOTE — MAU Note (Signed)
Pt sleeping - call bell within reach

## 2023-03-07 NOTE — MAU Note (Signed)
.  Jody Robinson is a 32 y.o. at [redacted]w[redacted]d here in MAU reporting: c/o "not feeling well" c/o abd pain. Stomach has tightened up a "few" times today. Has had a "salty taste in her mouth and feeling hot. Good fetal movement felt.  Pt vomiting but does not feel nauseated.   Onset of complaint: this morning Pain score: 7 Vitals:   03/07/23 1702  BP: (!) 139/97  Pulse: 81  Resp: 18  Temp: 98.7 F (37.1 C)     FHT:152 Lab orders placed from triage:  u/a

## 2023-03-07 NOTE — MAU Note (Signed)
Pt reports that her stomach pain resolved-H2O provided-pt vomited-see new orders

## 2023-03-07 NOTE — MAU Provider Note (Cosign Needed Addendum)
History     CSN: [id number]  Arrival date and time: 03/07/23 1647   None     Chief Complaint  Patient presents with   Abdominal Pain   HPI Jody Robinson is a 32 y.o. [id number] at [redacted]w[redacted]d who presents to MAU for abdominal pain and generally not feeling well. She reports she has not felt good all day. She reports she has a "salty taste in my mouth". She reports she "just doesn't feel well" and "feeling hot". She reports she has some abdominal pain in the middle of her stomach that hurts when she walks. It improves when she is able to lay down. She reports she has been vomiting all day but has not taken any medications. She last ate a peanut butter sandwich and strawberries this morning and has not had anything else due to low appetite. She has been able to tolerate some water. She reports an overall low appetite which has been normal during this pregnancy. She reports feeling lightheaded. She denies fever or chills. No vaginal bleeding, contractions, or leaking fluid. She reports normal fetal movement. Patient verbalizes "I don't think I'm going to make it". When asked to emphasize she reports she does not think she can continue to work while feeling so bad.   Pregnancy course: hyperemesis, Tourette's. Receives [facility] at [facility], next appointment is on 8/6.  OB History     Gravida  4   Para  2   Term  2   Preterm      AB  1   Living  2      SAB  1   IAB      Ectopic      Multiple  0   Live Births  2           Past Medical History:  Diagnosis Date   Abnormal Pap smear of cervix    Acute prerenal failure due to hyperemesis and pyelnonephritis(HCC) 10/03/2022   Anemia in pregnancy 03/01/2020   S/p IV feraheme 6/18 and 02/04/2020  CBC Latest Ref Rng & Units 02/06/2020 01/19/2020 12/21/2019 WBC 4.0 - 10.5 K/uL 17.3(H) 13.8(H) 14.9(H) Hemoglobin 12.0 - 15.0 g/dL 1.3(Y) 8.3(L) 8.5(L) Hematocrit 36 - 46 % 32.7(L) 26.9(L) 26.7(L) Platelets 150 - 400 K/uL 483(H) 472(H) 559(H)      Depression    Depression    Elevated liver enzymes 10/13/2017   [ ]  rpt cmp with 28wk labs   Gallbladder sludge 08/22/2019   Herpes simplex 01/16/2013   History of 15wk loss (2020) 02/06/2019   [ ]  f/u antiphosphlipid abs future lab (placed 4/22)  Less than 20 weeks   History of abnormal cervical Pap smear 10/09/2017   2020: pap smear wnl   History of COVID-19 09/10/2019   COVID+ 09/08/19 symptomatic    History of herpes genitalis 10/09/2022   History of IUFD 02/06/2019   Less than 20 weeks   History of renal insufficiency 12/02/2022   Due to pyelo and dehydration but need to trend Cr   Hyperemesis gravidarum with electrolyte imbalance 10/03/2022   Twice weekly infusions-- 1L LR, Phenergan, Zofran, Protonix for 15 weeks of therapy written by Dr Alvester Morin 2.26   Will get CMP x 4 weekly      [ ]  oral potassium?   Migraine headache 11/24/2022   Sees neurologist for migraines who manages Tourette's   Migraine without aura and without status migrainosus, not intractable 05/10/2022   Pancreatitis, acute 08/20/2019   Seasonal allergies 10/09/2017   Thrombocytosis 08/16/2019  CBC  Latest Ref Rng & Units  02/06/2020  01/19/2020  12/21/2019  WBC  4.0 - 10.5 K/uL  17.3(H)  13.8(H)  14.9(H)  Hemoglobin  12.0 - 15.0 g/dL  1.6(X)  8.3(L)  8.5(L)  Hematocrit  36 - 46 %  32.7(L)  26.9(L)  26.7(L)  Platelets  150 - 400 K/uL  483(H)  472(H)  559(H)         Tourette's syndrome    Trichomoniasis     Past Surgical History:  Procedure Laterality Date   COLPOSCOPY      Family History  Problem Relation Age of Onset   Diabetes Mother     Social History   Tobacco Use   Smoking status: Never   Smokeless tobacco: Never  Vaping Use   Vaping status: Never Used  Substance Use Topics   Alcohol use: Not Currently    Comment: occasional   Drug use: Not Currently    Allergies: No Known Allergies  Medications Prior to Admission  Medication Sig Dispense Refill Last Dose   aspirin EC 81 MG tablet Take 1  tablet (81 mg total) by mouth daily. Swallow whole. 30 tablet 12 Past Week   famotidine (PEPCID) 40 MG tablet Take 1 tablet (40 mg total) by mouth 2 (two) times daily. 60 tablet 2 03/07/2023   metoCLOPramide (REGLAN) 10 MG tablet Take 1 tablet (10 mg total) by mouth 4 (four) times daily. 30 tablet 2 Past Week   ondansetron (ZOFRAN-ODT) 4 MG disintegrating tablet Take 1 tablet (4 mg total) by mouth every 6 (six) hours as needed for nausea or vomiting. 30 tablet 3 Past Week   prenatal vitamin w/FE, FA (PRENATAL 1 + 1) 27-1 MG TABS tablet Take 1 tablet by mouth daily at 12 noon. 30 tablet 11 Past Week   acetaminophen (TYLENOL) 325 MG tablet Take 650 mg by mouth every 6 (six) hours as needed for mild pain or headache.      famotidine (PEPCID) 40 MG tablet Take 1 tablet (40 mg total) by mouth 2 (two) times daily. 60 tablet 1    promethazine (PHENERGAN) 25 MG tablet Take 1 tablet (25 mg total) by mouth every 6 (six) hours as needed for nausea, vomiting or refractory nausea / vomiting. 30 tablet 2    scopolamine (TRANSDERM-SCOP) 1 MG/3DAYS PLACE 1 PATCH ONTO THE SKIN EVERY 3 DAYS.      Review of Systems  Gastrointestinal:  Positive for abdominal pain, nausea and vomiting.  Neurological:  Positive for light-headedness.  All other systems reviewed and are negative.  Physical Exam   Patient Vitals for the past 24 hrs:  BP Temp Pulse Resp Height Weight  03/07/23 2000 (!) 123/56 -- 100 -- -- --  03/07/23 1758 (!) 93/56 -- 93 -- -- --  03/07/23 1702 (!) 139/97 98.7 F (37.1 C) 81 18 5\' 5"  (1.651 m) 65.3 kg   Physical Exam Vitals and nursing note reviewed.  Constitutional:      General: She is not in acute distress. Cardiovascular:     Rate and Rhythm: Normal rate.  Pulmonary:     Effort: Pulmonary effort is normal. No respiratory distress.  Abdominal:     Palpations: Abdomen is soft.     Tenderness: There is no abdominal tenderness. Negative signs include Murphy's sign and McBurney's sign.   Skin:    General: Skin is warm and dry.  Neurological:     General: No focal deficit present.     Mental Status: She is  alert and oriented to person, place, and time.  Psychiatric:        Mood and Affect: Mood normal.        Behavior: Behavior normal.    Results for orders placed or performed during the hospital encounter of 03/07/23 (from the past 24 hour(s))  Urinalysis, Routine w reflex microscopic -Urine, Clean Catch     Status: Abnormal   Collection Time: 03/07/23  5:52 PM  Result Value Ref Range   Color, Urine YELLOW YELLOW   APPearance CLEAR CLEAR   Specific Gravity, Urine 1.024 1.005 - 1.030   pH 6.0 5.0 - 8.0   Glucose, UA NEGATIVE NEGATIVE mg/dL   Hgb urine dipstick NEGATIVE NEGATIVE   Bilirubin Urine NEGATIVE NEGATIVE   Ketones, ur 80 (A) NEGATIVE mg/dL   Protein, ur 782 (A) NEGATIVE mg/dL   Nitrite NEGATIVE NEGATIVE   Leukocytes,Ua NEGATIVE NEGATIVE   RBC / HPF 0-5 0 - 5 RBC/hpf   WBC, UA 0-5 0 - 5 WBC/hpf   Bacteria, UA RARE (A) NONE SEEN   Squamous Epithelial / HPF 0-5 0 - 5 /HPF   Mucus PRESENT   CBC     Status: Abnormal   Collection Time: 03/07/23  7:04 PM  Result Value Ref Range   WBC 11.0 (H) 4.0 - 10.5 K/uL   RBC 4.46 3.87 - 5.11 MIL/uL   Hemoglobin 12.0 12.0 - 15.0 g/dL   HCT 95.6 21.3 - 08.6 %   MCV 84.1 80.0 - 100.0 fL   MCH 26.9 26.0 - 34.0 pg   MCHC 32.0 30.0 - 36.0 g/dL   RDW 57.8 46.9 - 62.9 %   Platelets 490 (H) 150 - 400 K/uL   nRBC 0.0 0.0 - 0.2 %  Comprehensive metabolic panel     Status: Abnormal   Collection Time: 03/07/23  7:04 PM  Result Value Ref Range   Sodium 139 135 - 145 mmol/L   Potassium 3.2 (L) 3.5 - 5.1 mmol/L   Chloride 101 98 - 111 mmol/L   CO2 19 (L) 22 - 32 mmol/L   Glucose, Bld 76 70 - 99 mg/dL   BUN 5 (L) 6 - 20 mg/dL   Creatinine, Ser 5.28 0.44 - 1.00 mg/dL   Calcium 8.6 (L) 8.9 - 10.3 mg/dL   Total Protein 6.8 6.5 - 8.1 g/dL   Albumin 3.0 (L) 3.5 - 5.0 g/dL   AST 24 15 - 41 U/L   ALT 17 0 - 44 U/L    Alkaline Phosphatase 140 (H) 38 - 126 U/L   Total Bilirubin 1.7 (H) 0.3 - 1.2 mg/dL   GFR, Estimated >41 >32 mL/min   Anion gap 19 (H) 5 - 15  Lipase, blood     Status: None   Collection Time: 03/07/23  7:04 PM  Result Value Ref Range   Lipase 37 11 - 51 U/L   NST FHR: 140 bpm, moderate variability, +15x15 accels, no decels Toco: quiet  MAU Course  Procedures  MDM UA CBC, CMP, Lipase LR, Reglan, Pepcid, Zofran Orthostatics  UA shows some ketonuria but otherwise unremarkable. CBC and CMP stable. No significant electrolyte abnormalities. Lipase negative. Patient given LR bolus with Reglan. Patient given PO challenge and unable to tolerate. She was given Pepcid and Zofran IV.   Care handed over to [facility], CNM at 2000.    Brand Males 03/07/2023, 8:16PM  Assessment and Plan  Reassessment (9:11 PM) -Nurse reports fluids complete. -Patient requesting discharge. -Precautions to be given. -  Follow up as scheduled. -Return as appropriate  Cherre Robins MSN, CNM Advanced Practice Provider, [facility] for [employer]

## 2023-03-11 ENCOUNTER — Inpatient Hospital Stay (HOSPITAL_COMMUNITY)
Admission: AD | Admit: 2023-03-11 | Discharge: 2023-03-11 | Disposition: A | Discharge status: Home / Self Care | Payer: 59 | Source: Home / Self Care | Attending: Obstetrics and Gynecology | Admitting: Obstetrics and Gynecology

## 2023-03-11 ENCOUNTER — Telehealth: Payer: Self-pay | Admitting: Family Medicine

## 2023-03-11 ENCOUNTER — Encounter (HOSPITAL_COMMUNITY): Payer: Self-pay | Admitting: Obstetrics and Gynecology

## 2023-03-11 DIAGNOSIS — Z3A33 33 weeks gestation of pregnancy: Secondary | ICD-10-CM | POA: Diagnosis not present

## 2023-03-11 DIAGNOSIS — O23593 Infection of other part of genital tract in pregnancy, third trimester: Secondary | ICD-10-CM | POA: Diagnosis not present

## 2023-03-11 DIAGNOSIS — R102 Pelvic and perineal pain: Secondary | ICD-10-CM | POA: Diagnosis not present

## 2023-03-11 DIAGNOSIS — Z3689 Encounter for other specified antenatal screening: Secondary | ICD-10-CM

## 2023-03-11 DIAGNOSIS — B9689 Other specified bacterial agents as the cause of diseases classified elsewhere: Secondary | ICD-10-CM | POA: Insufficient documentation

## 2023-03-11 DIAGNOSIS — O212 Late vomiting of pregnancy: Secondary | ICD-10-CM | POA: Insufficient documentation

## 2023-03-11 DIAGNOSIS — O26893 Other specified pregnancy related conditions, third trimester: Secondary | ICD-10-CM | POA: Diagnosis present

## 2023-03-11 DIAGNOSIS — N760 Acute vaginitis: Secondary | ICD-10-CM

## 2023-03-11 DIAGNOSIS — O99213 Obesity complicating pregnancy, third trimester: Secondary | ICD-10-CM | POA: Insufficient documentation

## 2023-03-11 LAB — URINALYSIS, ROUTINE W REFLEX MICROSCOPIC
Bilirubin Urine: NEGATIVE
Glucose, UA: NEGATIVE mg/dL
Hgb urine dipstick: NEGATIVE
Ketones, ur: 20 mg/dL — AB
Leukocytes,Ua: NEGATIVE
Nitrite: NEGATIVE
Protein, ur: 100 mg/dL — AB
Specific Gravity, Urine: 1.02 (ref 1.005–1.030)
pH: 6 (ref 5.0–8.0)

## 2023-03-11 LAB — WET PREP, GENITAL
Sperm: NONE SEEN
Trich, Wet Prep: NONE SEEN
WBC, Wet Prep HPF POC: >=10 — AB (ref <10)
Yeast Wet Prep HPF POC: NONE SEEN

## 2023-03-11 LAB — FETAL FIBRONECTIN: Fetal Fibronectin: NEGATIVE

## 2023-03-11 MED ORDER — METRONIDAZOLE 500 MG PO TABS: 500.0000 mg | ORAL_TABLET | Freq: Two times a day (BID) | ORAL | 0 refills | Status: DC

## 2023-03-11 NOTE — MAU Provider Note (Signed)
History     CSN: [id number]  Arrival date and time: 03/11/23 1418   Event Date/Time   First Provider Initiated Contact with Patient 03/11/23 1505      Chief Complaint  Patient presents with   Abdominal Pain   Abdominal Pain Pertinent negatives include no diarrhea, dysuria, fever, nausea or vomiting.   Patient is 32 y.o. [id number] at  [redacted]w[redacted]d here with complaints of pelvic pain and pressure that is constant making it hard to walk.  Pregnancy is complicated by hypothyroidism, Tourette's disease, obesity and significant nausea and vomiting in pregnancy that has led to multiple fluid infusions.  She denies any vaginal bleeding or discharge.  She reports good fetal movement.  She called the office today because of the of these complaints.  Per the note reviewed in epic the patient stated to them that she was having "really bad stomach pains, could barely walk stand, and the shortness of breath while standing.  She is still working and reported to them that she cannot work in these conditions.  Patient reports that her symptoms have remained the same since her last MAU visit which was on the 7/26. 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   Patient reports that her company will not be able to provide any accommodations for her. There are no positions with decreased standing.   Patient denies feeling any known contractions.  Patient reports her last cervical exam was over a week ago and has not had any sexual intercourse in the last week.  Patient denies any current nausea or vomiting.  Reports last vomiting event was several days ago.  Patient reports normal BMs throughout pregnancy although she does note they have been hard and somewhat difficult to pass. MAU visit 10 for this patient in the past 6 months  +FM, denies LOF, VB, contractions, vaginal discharge.   OB History     Gravida  4   Para  2   Term  2   Preterm      AB  1   Living  2      SAB  1    IAB      Ectopic      Multiple  0   Live Births  2           Past Medical History:  Diagnosis Date   Abnormal Pap smear of cervix    Acute prerenal failure due to hyperemesis and pyelnonephritis(HCC) 10/03/2022   Anemia in pregnancy 03/01/2020   S/p IV feraheme 6/18 and 02/04/2020  CBC Latest Ref Rng & Units 02/06/2020 01/19/2020 12/21/2019 WBC 4.0 - 10.5 K/uL 17.3(H) 13.8(H) 14.9(H) Hemoglobin 12.0 - 15.0 g/dL 1.3(Y) 8.3(L) 8.5(L) Hematocrit 36 - 46 % 32.7(L) 26.9(L) 26.7(L) Platelets 150 - 400 K/uL 483(H) 472(H) 559(H)     Depression    Depression    Elevated liver enzymes 10/13/2017   [ ]  rpt cmp with 28wk labs   Gallbladder sludge 08/22/2019   Herpes simplex 01/16/2013   History of 15wk loss (2020) 02/06/2019   [ ]  f/u antiphosphlipid abs future lab (placed 4/22)  Less than 20 weeks   History of abnormal cervical Pap smear 10/09/2017   2020: pap smear wnl   History of COVID-19 09/10/2019   COVID+ 09/08/19 symptomatic    History of herpes genitalis 10/09/2022   History of IUFD 02/06/2019   Less than 20 weeks   History of renal insufficiency 12/02/2022   Due to  pyelo and dehydration but need to trend Cr   Hyperemesis gravidarum with electrolyte imbalance 10/03/2022   Twice weekly infusions-- 1L LR, Phenergan, Zofran, Protonix for 15 weeks of therapy written by Dr Alvester Morin 2.26   Will get CMP x 4 weekly      [ ]  oral potassium?   Migraine headache 11/24/2022   Sees neurologist for migraines who manages Tourette's   Migraine without aura and without status migrainosus, not intractable 05/10/2022   Pancreatitis, acute 08/20/2019   Seasonal allergies 10/09/2017   Thrombocytosis 08/16/2019   CBC  Latest Ref Rng & Units  02/06/2020  01/19/2020  12/21/2019  WBC  4.0 - 10.5 K/uL  17.3(H)  13.8(H)  14.9(H)  Hemoglobin  12.0 - 15.0 g/dL  4.0(J)  8.3(L)  8.5(L)  Hematocrit  36 - 46 %  32.7(L)  26.9(L)  26.7(L)  Platelets  150 - 400 K/uL  483(H)  472(H)  559(H)         Tourette's syndrome     Trichomoniasis     Past Surgical History:  Procedure Laterality Date   COLPOSCOPY      Family History  Problem Relation Age of Onset   Diabetes Mother     Social History   Tobacco Use   Smoking status: Never   Smokeless tobacco: Never  Vaping Use   Vaping status: Never Used  Substance Use Topics   Alcohol use: Not Currently    Comment: occasional   Drug use: Not Currently    Allergies: No Known Allergies  Medications Prior to Admission  Medication Sig Dispense Refill Last Dose   aspirin EC 81 MG tablet Take 1 tablet (81 mg total) by mouth daily. Swallow whole. 30 tablet 12 03/11/2023   metoCLOPramide (REGLAN) 10 MG tablet Take 1 tablet (10 mg total) by mouth 4 (four) times daily. 30 tablet 2 03/10/2023   ondansetron (ZOFRAN-ODT) 4 MG disintegrating tablet Take 1 tablet (4 mg total) by mouth every 6 (six) hours as needed for nausea or vomiting. 30 tablet 3 03/10/2023   promethazine (PHENERGAN) 25 MG tablet Take 1 tablet (25 mg total) by mouth every 6 (six) hours as needed for nausea, vomiting or refractory nausea / vomiting. 30 tablet 2 03/10/2023   acetaminophen (TYLENOL) 325 MG tablet Take 650 mg by mouth every 6 (six) hours as needed for mild pain or headache.      famotidine (PEPCID) 40 MG tablet Take 1 tablet (40 mg total) by mouth 2 (two) times daily. 60 tablet 2    famotidine (PEPCID) 40 MG tablet Take 1 tablet (40 mg total) by mouth 2 (two) times daily. 60 tablet 1    prenatal vitamin w/FE, FA (PRENATAL 1 + 1) 27-1 MG TABS tablet Take 1 tablet by mouth daily at 12 noon. 30 tablet 11    scopolamine (TRANSDERM-SCOP) 1 MG/3DAYS PLACE 1 PATCH ONTO THE SKIN EVERY 3 DAYS.       Review of Systems  Constitutional:  Negative for chills and fever.  HENT:  Negative for congestion and sore throat.   Eyes:  Negative for pain and visual disturbance.  Respiratory:  Negative for cough, chest tightness and shortness of breath.   Cardiovascular:  Negative for chest pain.   Gastrointestinal:  Positive for abdominal pain. Negative for diarrhea, nausea and vomiting.  Endocrine: Negative for cold intolerance and heat intolerance.  Genitourinary:  Negative for dysuria and flank pain.  Musculoskeletal:  Negative for back pain.  Skin:  Negative for rash.  Allergic/Immunologic:  Negative for food allergies.  Neurological:  Negative for dizziness and light-headedness.  Psychiatric/Behavioral:  Negative for agitation.    Physical Exam   Blood pressure 117/74, pulse 96, temperature 98.4 F (36.9 C), resp. rate 18, height 5\' 5"  (1.651 m), weight 65.8 kg, last menstrual period 07/22/2022.  Physical Exam Vitals and nursing note reviewed. Exam conducted with a chaperone present.  Constitutional:      General: She is not in acute distress.    Appearance: She is well-developed.     Comments: Interactive.  Makes intermittent eye contact.  Pregnant female.  Patient clears her throat at a regular rate while in the room.  Patient also snorts regularly.  HENT:     Head: Normocephalic and atraumatic.  Eyes:     General: No scleral icterus.    Conjunctiva/sclera: Conjunctivae normal.  Cardiovascular:     Rate and Rhythm: Normal rate.  Pulmonary:     Effort: Pulmonary effort is normal.  Chest:     Chest wall: No tenderness.  Abdominal:     Palpations: Abdomen is soft.     Tenderness: There is no abdominal tenderness. There is no guarding or rebound.     Comments: Gravid  Genitourinary:    Exam position: Lithotomy position.     Vagina: Vaginal discharge (mild vaginal odor) present.     Cervix: Normal.     Uterus: Enlarged (gravid).   Musculoskeletal:        General: Normal range of motion.     Cervical back: Normal range of motion and neck supple.  Skin:    General: Skin is warm and dry.     Findings: No rash.  Neurological:     Mental Status: She is alert and oriented to person, place, and time.     MAU Course  Procedures I reviewed the patient's fetal  monitoring.  Baseline HR: 150 Variability:  moderate Accels:present Decels: none  A/P: Reactive NST  Reassured regarding fetal status.   MDM: moderate  This patient presents to the ED for concern of   Chief Complaint  Patient presents with   Abdominal Pain     These complains involves an extensive number of treatment options, and is a complaint that carries with it a high risk of complications and morbidity.  The differential diagnosis for  1. Pelvic pressure/pain INCLUDES preterm labor, pelvic infection, normal variant, fetal position. Unlikely preterm labor given non dilated on exam, no contractions on monitor. FFN collected prior to cervical exam and was negative.   Co morbidities that complicate the patient evaluation: Patient Active Problem List   Diagnosis Date Noted   Nausea and vomiting of pregnancy, antepartum 03/04/2023   Obesity affecting pregnancy 11/25/2022   Supervision of high risk pregnancy, antepartum 10/08/2022   Tourette's disease 05/10/2022   Hyperthyroidism affecting pregnancy in second trimester 09/17/2019   Thrombocytosis 08/16/2019     External records from outside source obtained and reviewed including Prenatal care records  Lab Tests: Other FFN , wet prep  I ordered, and personally interpreted labs.  The pertinent results include:  FFN neg  Imaging Studies ordered: None  Medicines ordered and prescription drug management:  Medications: Metronidazole   Reevaluation of the patient after these medicines showed that the patient improved I have reviewed the patients home medicines and have made adjustments as needed  Test Considered: CT scan -- no warranted given benign abdominal exam and lack of other history aspects  MAU Course:  6:38 PM updated patient  After the interventions noted above, I reevaluated the patient and found that they have :improved  Dispostion: discharged   Assessment and Plan   1. Pelvic pressure in pregnancy,  antepartum, third trimester   2. NST (non-stress test) reactive   3. [redacted] weeks gestation of pregnancy   4. Pelvic pain affecting pregnancy in third trimester, antepartum   5. BV (bacterial vaginosis)    -Discussed management of pelvic pressure with pregnancy support belt, stretches, physical therapy.  Patient reports that in previous pregnancies these were not helpful. -Patient requests being written out of work until her next prenatal appointment which is on August 6 -Reviewed with patient labor and preterm labor return precautions -Fetal testing here in the MAU was reassuring -Rx for Metronidazole was sent to the pharmacy  Federico Flake 03/11/2023, 3:05 PM

## 2023-03-11 NOTE — Telephone Encounter (Signed)
Patient called in stating she is 38 weeks preg. And is having really bad stomach pains, Can barely stand and walk, shortness of breath while standing. She needs to get some answers on how she can make herself feel better because she is still working and can not work in these conditions.

## 2023-03-11 NOTE — MAU Note (Signed)
.  Jody Robinson is a 32 y.o. at [redacted]w[redacted]d here in MAU reporting: pelvic pain and pressure that is constant that is making it hard to walk. Denies any vag bleeding or discharge. Good fetal movement reported.  LMP:  Onset of complaint: this afternoon Pain score: 8 Vitals:   03/11/23 1429  BP: 117/74  Pulse: 96  Resp: 18  Temp: 98.4 F (36.9 C)     FHT:133 Lab orders placed from triage:  u/a

## 2023-03-11 NOTE — Telephone Encounter (Signed)
Contacted pt and advised pt to go to MAU for evaluation.  Pt verbalized understanding with no further questions.   Jody Robinson

## 2023-03-13 ENCOUNTER — Non-Acute Institutional Stay (HOSPITAL_COMMUNITY)
Admission: RE | Admit: 2023-03-13 | Discharge: 2023-03-13 | Disposition: A | Discharge status: Home / Self Care | Payer: 59 | Source: Ambulatory Visit | Attending: Internal Medicine | Admitting: Internal Medicine

## 2023-03-13 DIAGNOSIS — O211 Hyperemesis gravidarum with metabolic disturbance: Secondary | ICD-10-CM | POA: Insufficient documentation

## 2023-03-13 DIAGNOSIS — Z3A00 Weeks of gestation of pregnancy not specified: Secondary | ICD-10-CM | POA: Diagnosis not present

## 2023-03-13 MED ORDER — LACTATED RINGERS IV BOLUS
1000.0000 mL | Freq: Once | INTRAVENOUS | Status: AC
  Administered 2023-03-13: 1000 mL via INTRAVENOUS

## 2023-03-13 MED ORDER — ONDANSETRON 8 MG/NS 50 ML IVPB
8.0000 mg | Freq: Once | INTRAVENOUS | Status: AC
  Administered 2023-03-13: 8 mg via INTRAVENOUS
  Filled 2023-03-13: qty 54

## 2023-03-13 MED ORDER — SODIUM CHLORIDE 0.9 % IV SOLN
12.5000 mg | Freq: Once | INTRAVENOUS | Status: AC
  Administered 2023-03-13: 12.5 mg via INTRAVENOUS
  Filled 2023-03-13: qty 12.5

## 2023-03-13 MED ORDER — SODIUM CHLORIDE 0.9 % IV SOLN: INTRAVENOUS | Status: DC | PRN

## 2023-03-13 MED ORDER — PANTOPRAZOLE SODIUM 40 MG IV SOLR
40.0000 mg | Freq: Once | INTRAVENOUS | Status: AC
  Administered 2023-03-13: 40 mg via INTRAVENOUS
  Filled 2023-03-13: qty 10

## 2023-03-13 NOTE — Progress Notes (Signed)
PATIENT CARE CENTER NOTE  Diagnosis: Hyperemesis gravidarum with electrolyte imbalance [O21.1]      Provider: Lyndel Safe MD   Procedure:  Fluid Bolus and Anti-emetics (dose #28 of 30)   Note: Patient received 1 Litre Lactated Ringers fluid bolus over 1 hour, IVPB Phenergan 12.5 mg, IVPB Zofran 8 mg, and IV Protonix 40 mg via PIV. Patient tolerated infusion and medication with no adverse reaction. Vital signs are stable. Pt can come twice a week for a total of 30 doses. Pt advised to schedule next appointment at front desk. AVS offered, but pt declined. Pt is alert, oriented, and ambulatory at discharge.

## 2023-03-18 ENCOUNTER — Non-Acute Institutional Stay (HOSPITAL_COMMUNITY)
Admission: RE | Admit: 2023-03-18 | Discharge: 2023-03-18 | Disposition: A | Discharge status: Home / Self Care | Payer: 59 | Source: Ambulatory Visit | Attending: Internal Medicine | Admitting: Internal Medicine

## 2023-03-18 ENCOUNTER — Ambulatory Visit: Payer: 59 | Admitting: Obstetrics & Gynecology

## 2023-03-18 ENCOUNTER — Encounter: Payer: Self-pay | Admitting: Family Medicine

## 2023-03-18 VITALS — BP 98/65 | HR 115 | Wt 140.8 lb

## 2023-03-18 DIAGNOSIS — O0990 Supervision of high risk pregnancy, unspecified, unspecified trimester: Secondary | ICD-10-CM

## 2023-03-18 DIAGNOSIS — Z3A34 34 weeks gestation of pregnancy: Secondary | ICD-10-CM

## 2023-03-18 DIAGNOSIS — O211 Hyperemesis gravidarum with metabolic disturbance: Secondary | ICD-10-CM | POA: Insufficient documentation

## 2023-03-18 DIAGNOSIS — O0993 Supervision of high risk pregnancy, unspecified, third trimester: Secondary | ICD-10-CM

## 2023-03-18 DIAGNOSIS — Z3A00 Weeks of gestation of pregnancy not specified: Secondary | ICD-10-CM | POA: Diagnosis not present

## 2023-03-18 DIAGNOSIS — O210 Mild hyperemesis gravidarum: Secondary | ICD-10-CM

## 2023-03-18 MED ORDER — SODIUM CHLORIDE 0.9 % IV SOLN: INTRAVENOUS | Status: DC | PRN

## 2023-03-18 MED ORDER — PANTOPRAZOLE SODIUM 40 MG PO TBEC
40.0000 mg | DELAYED_RELEASE_TABLET | Freq: Every day | ORAL | 3 refills | Status: DC
Start: 2023-03-18 — End: 2023-06-04

## 2023-03-18 MED ORDER — SODIUM CHLORIDE 0.9 % IV SOLN
12.5000 mg | Freq: Once | INTRAVENOUS | Status: AC
  Administered 2023-03-18: 12.5 mg via INTRAVENOUS
  Filled 2023-03-18: qty 12.5

## 2023-03-18 MED ORDER — SODIUM CHLORIDE 0.9 % IV SOLN
8.0000 mg | Freq: Once | INTRAVENOUS | Status: AC
  Administered 2023-03-18: 8 mg via INTRAVENOUS
  Filled 2023-03-18: qty 4

## 2023-03-18 MED ORDER — LACTATED RINGERS IV BOLUS
1000.0000 mL | Freq: Once | INTRAVENOUS | Status: AC
  Administered 2023-03-18: 1000 mL via INTRAVENOUS

## 2023-03-18 MED ORDER — PANTOPRAZOLE SODIUM 40 MG IV SOLR
40.0000 mg | Freq: Once | INTRAVENOUS | Status: AC
  Administered 2023-03-18: 40 mg via INTRAVENOUS
  Filled 2023-03-18: qty 10

## 2023-03-18 NOTE — Progress Notes (Signed)
PATIENT CARE CENTER NOTE  Diagnosis: Hyperemesis gravidarum with electrolyte imbalance [O21.1]      Provider: Lyndel Safe MD   Procedure:  Fluid Bolus and Anti-emetics (dose #29 of 30)   Note: Patient received 1 Litre Lactated Ringers fluid bolus over 1 hour, IVPB Phenergan 12.5 mg, IVPB Zofran 8 mg, and IV Protonix 40 mg via PIV. Patient tolerated infusion and medication with no adverse reaction. Vital signs are stable. Pt can come twice a week for a total of 30 doses. Pt advised to schedule next appointment at front desk. AVS offered, but pt declined. Pt is alert, oriented, and ambulatory at discharge.

## 2023-03-18 NOTE — Progress Notes (Signed)
   PRENATAL VISIT NOTE  Subjective:  Jody Robinson is a 32 y.o. [id number] at [redacted]w[redacted]d being seen today for ongoing prenatal care.  She is currently monitored for the following issues for this high-risk pregnancy and has Thrombocytosis; Hyperthyroidism affecting pregnancy in second trimester; Tourette's disease; Supervision of high risk pregnancy, antepartum; Obesity affecting pregnancy; and Nausea and vomiting of pregnancy, antepartum on their problem list.  Patient reports heartburn, nausea, and vomiting.  Contractions: Not present. Vag. Bleeding: None.  Movement: Present. Denies leaking of fluid.   The following portions of the patient's history were reviewed and updated as appropriate: allergies, current medications, past family history, past medical history, past social history, past surgical history and problem list.   Objective:   Vitals:   03/18/23 1344  BP: 98/65  Pulse: (!) 115  Weight: 140 lb 12.8 oz (63.9 kg)    Fetal Status: Fetal Heart Rate (bpm): 145   Movement: Present     General:  Alert, oriented and cooperative. Patient is in no acute distress.  Skin: Skin is warm and dry. No rash noted.   Cardiovascular: Normal heart rate noted  Respiratory: Normal respiratory effort, no problems with respiration noted  Abdomen: Soft, gravid, appropriate for gestational age.  Pain/Pressure: Present     Pelvic: Cervical exam deferred        Extremities: Normal range of motion.  Edema: None  Mental Status: Normal mood and affect. Normal behavior. Normal judgment and thought content.   Assessment and Plan:  Pregnancy: [id number] at [redacted]w[redacted]d 1. Supervision of high risk pregnancy, antepartum Needs growth [name] due to weight loss  2. Hyperemesis affecting pregnancy, antepartum Add Protonix  Preterm labor symptoms and general obstetric precautions including but not limited to vaginal bleeding, contractions, leaking of fluid and fetal movement were reviewed in detail with the patient. Please  refer to After Visit Summary for other counseling recommendations.   Return in about 2 weeks (around 04/01/2023).  Future Appointments  Date Time Provider Department Center  10/23/2023 11:30 AM [facility] None  10/30/2023 11:40 AM Jaci Standard, MD [facility] None    Scheryl Darter, MD

## 2023-03-21 ENCOUNTER — Other Ambulatory Visit: Payer: Self-pay | Admitting: Obstetrics & Gynecology

## 2023-03-21 ENCOUNTER — Ambulatory Visit: Payer: 59 | Admitting: *Deleted

## 2023-03-21 ENCOUNTER — Ambulatory Visit: Payer: 59 | Attending: Obstetrics & Gynecology

## 2023-03-21 ENCOUNTER — Other Ambulatory Visit: Payer: Self-pay | Admitting: *Deleted

## 2023-03-21 VITALS — BP 109/71 | HR 97

## 2023-03-21 DIAGNOSIS — O99283 Endocrine, nutritional and metabolic diseases complicating pregnancy, third trimester: Secondary | ICD-10-CM

## 2023-03-21 DIAGNOSIS — O35EXX0 Maternal care for other (suspected) fetal abnormality and damage, fetal genitourinary anomalies, not applicable or unspecified: Secondary | ICD-10-CM

## 2023-03-21 DIAGNOSIS — O210 Mild hyperemesis gravidarum: Secondary | ICD-10-CM | POA: Diagnosis present

## 2023-03-21 DIAGNOSIS — O09293 Supervision of pregnancy with other poor reproductive or obstetric history, third trimester: Secondary | ICD-10-CM | POA: Diagnosis not present

## 2023-03-21 DIAGNOSIS — D75839 Thrombocytosis, unspecified: Secondary | ICD-10-CM | POA: Diagnosis not present

## 2023-03-21 DIAGNOSIS — O0990 Supervision of high risk pregnancy, unspecified, unspecified trimester: Secondary | ICD-10-CM

## 2023-03-21 DIAGNOSIS — Z3A34 34 weeks gestation of pregnancy: Secondary | ICD-10-CM

## 2023-03-21 DIAGNOSIS — E079 Disorder of thyroid, unspecified: Secondary | ICD-10-CM

## 2023-03-21 DIAGNOSIS — O99113 Other diseases of the blood and blood-forming organs and certain disorders involving the immune mechanism complicating pregnancy, third trimester: Secondary | ICD-10-CM | POA: Diagnosis not present

## 2023-03-21 DIAGNOSIS — O99013 Anemia complicating pregnancy, third trimester: Secondary | ICD-10-CM

## 2023-03-21 DIAGNOSIS — Z3689 Encounter for other specified antenatal screening: Secondary | ICD-10-CM

## 2023-03-21 DIAGNOSIS — D649 Anemia, unspecified: Secondary | ICD-10-CM

## 2023-03-28 ENCOUNTER — Ambulatory Visit: Payer: 59 | Admitting: *Deleted

## 2023-03-28 ENCOUNTER — Ambulatory Visit: Payer: 59 | Attending: Maternal & Fetal Medicine | Admitting: *Deleted

## 2023-03-28 VITALS — BP 118/71 | HR 84

## 2023-03-28 DIAGNOSIS — E0590 Thyrotoxicosis, unspecified without thyrotoxic crisis or storm: Secondary | ICD-10-CM | POA: Insufficient documentation

## 2023-03-28 DIAGNOSIS — Z3A35 35 weeks gestation of pregnancy: Secondary | ICD-10-CM | POA: Diagnosis not present

## 2023-03-28 DIAGNOSIS — O0990 Supervision of high risk pregnancy, unspecified, unspecified trimester: Secondary | ICD-10-CM

## 2023-03-28 DIAGNOSIS — O99283 Endocrine, nutritional and metabolic diseases complicating pregnancy, third trimester: Secondary | ICD-10-CM | POA: Insufficient documentation

## 2023-03-28 NOTE — Procedures (Signed)
Binah Abella 12/07/1990 [redacted]w[redacted]d  Fetus A Non-Stress Test Interpretation for 03/28/23  NST only  Indication:  Hyperthyroidism  Fetal Heart Rate A Mode: External Baseline Rate (A): 140 bpm Variability: Moderate Accelerations: 15 x 15 Decelerations: None Multiple birth?: No  Uterine Activity Mode: Palpation, Toco Contraction Frequency (min): none Resting Tone Palpated: Relaxed  Interpretation (Fetal Testing) Nonstress Test Interpretation: Reactive Overall Impression: Reassuring for gestational age Comments: Dr. Judeth Cornfield reviewed tracing

## 2023-03-31 DIAGNOSIS — Z0289 Encounter for other administrative examinations: Secondary | ICD-10-CM

## 2023-04-01 ENCOUNTER — Other Ambulatory Visit: Payer: Self-pay

## 2023-04-01 ENCOUNTER — Other Ambulatory Visit (HOSPITAL_COMMUNITY)
Admission: RE | Admit: 2023-04-01 | Discharge: 2023-04-01 | Disposition: A | Discharge status: Home / Self Care | Payer: 59 | Source: Ambulatory Visit | Attending: Family Medicine | Admitting: Family Medicine

## 2023-04-01 ENCOUNTER — Ambulatory Visit (INDEPENDENT_AMBULATORY_CARE_PROVIDER_SITE_OTHER): Payer: 59 | Admitting: Family Medicine

## 2023-04-01 VITALS — BP 106/71 | HR 72 | Wt 150.0 lb

## 2023-04-01 DIAGNOSIS — O99210 Obesity complicating pregnancy, unspecified trimester: Secondary | ICD-10-CM | POA: Diagnosis present

## 2023-04-01 DIAGNOSIS — O0990 Supervision of high risk pregnancy, unspecified, unspecified trimester: Secondary | ICD-10-CM | POA: Insufficient documentation

## 2023-04-01 DIAGNOSIS — E0590 Thyrotoxicosis, unspecified without thyrotoxic crisis or storm: Secondary | ICD-10-CM

## 2023-04-01 DIAGNOSIS — O99213 Obesity complicating pregnancy, third trimester: Secondary | ICD-10-CM

## 2023-04-01 DIAGNOSIS — Z3A36 36 weeks gestation of pregnancy: Secondary | ICD-10-CM

## 2023-04-01 DIAGNOSIS — O0993 Supervision of high risk pregnancy, unspecified, third trimester: Secondary | ICD-10-CM

## 2023-04-01 DIAGNOSIS — O162 Unspecified maternal hypertension, second trimester: Secondary | ICD-10-CM | POA: Insufficient documentation

## 2023-04-01 DIAGNOSIS — O99282 Endocrine, nutritional and metabolic diseases complicating pregnancy, second trimester: Secondary | ICD-10-CM

## 2023-04-01 DIAGNOSIS — O99283 Endocrine, nutritional and metabolic diseases complicating pregnancy, third trimester: Secondary | ICD-10-CM

## 2023-04-01 NOTE — Progress Notes (Signed)
   PRENATAL VISIT NOTE  Subjective:  Jody Robinson is a 32 y.o. [id number] at [redacted]w[redacted]d being seen today for ongoing prenatal care.  She is currently monitored for the following issues for this high-risk pregnancy and has Thrombocytosis; Hyperthyroidism affecting pregnancy in second trimester; Tourette's disease; Supervision of high risk pregnancy, antepartum; Obesity affecting pregnancy; and Nausea and vomiting of pregnancy, antepartum on their problem list.  Patient reports no complaints.  Contractions: Irritability. Vag. Bleeding: None.  Movement: Present. Denies leaking of fluid.   The following portions of the patient's history were reviewed and updated as appropriate: allergies, current medications, past family history, past medical history, past social history, past surgical history and problem list.   Objective:   Vitals:   04/01/23 1137  BP: 106/71  Pulse: 72  Weight: 150 lb (68 kg)    Fetal Status: Fetal Heart Rate (bpm): 157 Fundal Height: 32 cm Movement: Present  Presentation: Vertex  General:  Alert, oriented and cooperative. Patient is in no acute distress.  Skin: Skin is warm and dry. No rash noted.   Cardiovascular: Normal heart rate noted  Respiratory: Normal respiratory effort, no problems with respiration noted  Abdomen: Soft, gravid, appropriate for gestational age.  Pain/Pressure: Present     Pelvic: Cervical exam performed in the presence of a chaperone Dilation: 1.5 Effacement (%): 50 Station: -2  Extremities: Normal range of motion.     Mental Status: Normal mood and affect. Normal behavior. Normal judgment and thought content.   Assessment and Plan:  Pregnancy: [id number] at [redacted]w[redacted]d 1. Hyperthyroidism affecting pregnancy in second trimester Normal recent growth, BPPs weekly - US Fetal BPP W/O Non Stress; Future - US Fetal BPP W/O Non Stress; Future  2. Supervision of high risk pregnancy, antepartum Cultures today - Culture, beta strep (group b only) -  GC/Chlamydia probe amp ([place])not at [name] Fetal BPP W/O Non Stress; Future - US Fetal BPP W/O Non Stress; Future  3. Obesity affecting pregnancy, antepartum, unspecified obesity type   Preterm labor symptoms and general obstetric precautions including but not limited to vaginal bleeding, contractions, leaking of fluid and fetal movement were reviewed in detail with the patient. Please refer to After Visit Summary for other counseling recommendations.   Return in 1 week (on 04/08/2023) for OB visit and BPP.  Future Appointments  Date Time Provider Department Center  04/02/2023 11:15 AM [facility] US2 [facility]  04/08/2023  3:55 PM Adam Phenix, MD [facility]  04/10/2023  8:15 AM [facility] US2 [facility]  04/21/2023  1:15 PM [facility] NURSE [facility]  04/21/2023  1:30 PM [facility] US3 [facility]  10/23/2023 11:30 AM [facility] LAB [facility] None  10/30/2023 11:40 AM Jaci Standard, MD [facility] None    Reva Bores, MD

## 2023-04-02 ENCOUNTER — Ambulatory Visit: Payer: 59

## 2023-04-02 DIAGNOSIS — O0990 Supervision of high risk pregnancy, unspecified, unspecified trimester: Secondary | ICD-10-CM

## 2023-04-02 DIAGNOSIS — O99283 Endocrine, nutritional and metabolic diseases complicating pregnancy, third trimester: Secondary | ICD-10-CM

## 2023-04-02 DIAGNOSIS — O99213 Obesity complicating pregnancy, third trimester: Secondary | ICD-10-CM | POA: Diagnosis not present

## 2023-04-02 DIAGNOSIS — E0590 Thyrotoxicosis, unspecified without thyrotoxic crisis or storm: Secondary | ICD-10-CM

## 2023-04-02 DIAGNOSIS — O99210 Obesity complicating pregnancy, unspecified trimester: Secondary | ICD-10-CM

## 2023-04-02 DIAGNOSIS — O0993 Supervision of high risk pregnancy, unspecified, third trimester: Secondary | ICD-10-CM | POA: Diagnosis not present

## 2023-04-02 DIAGNOSIS — Z3A36 36 weeks gestation of pregnancy: Secondary | ICD-10-CM

## 2023-04-02 LAB — GC/CHLAMYDIA PROBE AMP (~~LOC~~) NOT AT ARMC
Chlamydia: NEGATIVE
Comment: NEGATIVE
Comment: NORMAL
Neisseria Gonorrhea: NEGATIVE

## 2023-04-03 ENCOUNTER — Ambulatory Visit: Payer: 59

## 2023-04-04 LAB — CULTURE, BETA STREP (GROUP B ONLY): Strep Gp B Culture: NEGATIVE

## 2023-04-08 ENCOUNTER — Ambulatory Visit (INDEPENDENT_AMBULATORY_CARE_PROVIDER_SITE_OTHER): Payer: 59 | Admitting: Obstetrics & Gynecology

## 2023-04-08 ENCOUNTER — Non-Acute Institutional Stay (HOSPITAL_COMMUNITY)
Admission: RE | Admit: 2023-04-08 | Discharge: 2023-04-08 | Disposition: A | Discharge status: Home / Self Care | Payer: 59 | Source: Ambulatory Visit | Attending: Internal Medicine | Admitting: Internal Medicine

## 2023-04-08 ENCOUNTER — Other Ambulatory Visit: Payer: Self-pay

## 2023-04-08 ENCOUNTER — Encounter: Payer: Self-pay | Admitting: Family Medicine

## 2023-04-08 VITALS — BP 113/79 | HR 85 | Wt 149.6 lb

## 2023-04-08 DIAGNOSIS — O0990 Supervision of high risk pregnancy, unspecified, unspecified trimester: Secondary | ICD-10-CM

## 2023-04-08 DIAGNOSIS — Z3A00 Weeks of gestation of pregnancy not specified: Secondary | ICD-10-CM | POA: Diagnosis not present

## 2023-04-08 DIAGNOSIS — O211 Hyperemesis gravidarum with metabolic disturbance: Secondary | ICD-10-CM | POA: Diagnosis present

## 2023-04-08 DIAGNOSIS — Z3A37 37 weeks gestation of pregnancy: Secondary | ICD-10-CM

## 2023-04-08 DIAGNOSIS — O0993 Supervision of high risk pregnancy, unspecified, third trimester: Secondary | ICD-10-CM

## 2023-04-08 MED ORDER — PANTOPRAZOLE SODIUM 40 MG IV SOLR
40.0000 mg | Freq: Once | INTRAVENOUS | Status: AC
  Administered 2023-04-08: 40 mg via INTRAVENOUS
  Filled 2023-04-08: qty 10

## 2023-04-08 MED ORDER — SODIUM CHLORIDE 0.9 % IV SOLN
12.5000 mg | Freq: Once | INTRAVENOUS | Status: AC
  Administered 2023-04-08: 12.5 mg via INTRAVENOUS
  Filled 2023-04-08: qty 12.5

## 2023-04-08 MED ORDER — ONDANSETRON 8 MG/NS 50 ML IVPB
8.0000 mg | Freq: Once | INTRAVENOUS | Status: AC
  Administered 2023-04-08: 8 mg via INTRAVENOUS
  Filled 2023-04-08: qty 8

## 2023-04-08 MED ORDER — SODIUM CHLORIDE 0.9 % IV SOLN: INTRAVENOUS | Status: DC | PRN

## 2023-04-08 MED ORDER — LACTATED RINGERS IV BOLUS
1000.0000 mL | Freq: Once | INTRAVENOUS | Status: AC
  Administered 2023-04-08: 1000 mL via INTRAVENOUS

## 2023-04-08 NOTE — Progress Notes (Signed)
PATIENT CARE CENTER NOTE   Diagnosis: Hyperemesis gravidarum with electrolyte imbalance [O21.1]      Provider: Lyndel Safe, MD    Procedure:  Fluid Bolus and Anti-emetics (dose #30 of 30)    Note: Patient received 1 Liter Lactated Ringers fluid bolus over 1 hour, IVPB Phenergan 12.5 mg, IVPB Zofran 8 mg, and IV Protonix 40 mg via PIV. Patient tolerated infusion and medications with no adverse reaction. Vital signs are stable. This was patient's last ordered dose.  AVS offered, but pt declined. Patient is alert, oriented, and ambulatory at discharge.

## 2023-04-08 NOTE — Progress Notes (Signed)
   PRENATAL VISIT NOTE  Subjective:  Jody Robinson is a 32 y.o. [id number] at [redacted]w[redacted]d being seen today for ongoing prenatal care.  She is currently monitored for the following issues for this low-risk pregnancy and has Thrombocytosis; Hyperthyroidism affecting pregnancy in second trimester; Tourette's disease; Supervision of high risk pregnancy, antepartum; Obesity affecting pregnancy; and Nausea and vomiting of pregnancy, antepartum on their problem list.  Patient reports occasional contractions.  Contractions: Irritability. Vag. Bleeding: None.  Movement: Present. Denies leaking of fluid.   The following portions of the patient's history were reviewed and updated as appropriate: allergies, current medications, past family history, past medical history, past social history, past surgical history and problem list.   Objective:   Vitals:   04/08/23 1606  BP: 113/79  Pulse: 85  Weight: 149 lb 9.6 oz (67.9 kg)    Fetal Status: Fetal Heart Rate (bpm): 150   Movement: Present  Presentation: Vertex  General:  Alert, oriented and cooperative. Patient is in no acute distress.  Skin: Skin is warm and dry. No rash noted.   Cardiovascular: Normal heart rate noted  Respiratory: Normal respiratory effort, no problems with respiration noted  Abdomen: Soft, gravid, appropriate for gestational age.  Pain/Pressure: Present     Pelvic: Cervical exam performed in the presence of a chaperone Dilation: 1.5 Effacement (%): 50 Station: -3  Extremities: Normal range of motion.  Edema: None  Mental Status: Normal mood and affect. Normal behavior. Normal judgment and thought content.   Assessment and Plan:  Pregnancy: [id number] at [redacted]w[redacted]d 1. Supervision of high risk pregnancy, antepartum   Term labor symptoms and general obstetric precautions including but not limited to vaginal bleeding, contractions, leaking of fluid and fetal movement were reviewed in detail with the patient. Please refer to After Visit  Summary for other counseling recommendations.   Return in about 1 week (around 04/15/2023).  Future Appointments  Date Time Provider Department Center  04/10/2023  8:15 AM [facility] US2 [facility]  04/21/2023  1:15 PM [facility] NURSE [facility]  04/21/2023  1:30 PM [facility] US3 [facility]  10/23/2023 11:30 AM [facility] LAB [facility] None  10/30/2023 11:40 AM Jaci Standard, MD [facility] None    Scheryl Darter, MD

## 2023-04-10 ENCOUNTER — Ambulatory Visit: Payer: 59

## 2023-04-10 ENCOUNTER — Other Ambulatory Visit: Payer: Self-pay

## 2023-04-10 DIAGNOSIS — Z0289 Encounter for other administrative examinations: Secondary | ICD-10-CM

## 2023-04-16 ENCOUNTER — Other Ambulatory Visit: Payer: Self-pay

## 2023-04-16 ENCOUNTER — Encounter: Payer: 59 | Admitting: Advanced Practice Midwife

## 2023-04-16 DIAGNOSIS — E0590 Thyrotoxicosis, unspecified without thyrotoxic crisis or storm: Secondary | ICD-10-CM

## 2023-04-17 ENCOUNTER — Encounter: Payer: 59 | Admitting: Obstetrics and Gynecology

## 2023-04-17 ENCOUNTER — Ambulatory Visit (INDEPENDENT_AMBULATORY_CARE_PROVIDER_SITE_OTHER): Payer: 59

## 2023-04-17 DIAGNOSIS — O99283 Endocrine, nutritional and metabolic diseases complicating pregnancy, third trimester: Secondary | ICD-10-CM

## 2023-04-17 DIAGNOSIS — O0993 Supervision of high risk pregnancy, unspecified, third trimester: Secondary | ICD-10-CM | POA: Diagnosis not present

## 2023-04-17 DIAGNOSIS — O99213 Obesity complicating pregnancy, third trimester: Secondary | ICD-10-CM

## 2023-04-17 DIAGNOSIS — E0590 Thyrotoxicosis, unspecified without thyrotoxic crisis or storm: Secondary | ICD-10-CM

## 2023-04-17 DIAGNOSIS — Z3A28 28 weeks gestation of pregnancy: Secondary | ICD-10-CM

## 2023-04-21 ENCOUNTER — Ambulatory Visit: Payer: 59

## 2023-04-21 ENCOUNTER — Ambulatory Visit: Payer: 59 | Attending: Maternal & Fetal Medicine

## 2023-04-22 ENCOUNTER — Encounter (HOSPITAL_COMMUNITY): Payer: Self-pay | Admitting: Obstetrics & Gynecology

## 2023-04-22 ENCOUNTER — Inpatient Hospital Stay (HOSPITAL_COMMUNITY)
Admission: AD | Admit: 2023-04-22 | Discharge: 2023-04-24 | DRG: 806 | Disposition: A | Discharge status: Home / Self Care | Payer: 59 | Attending: Obstetrics and Gynecology | Admitting: Obstetrics and Gynecology

## 2023-04-22 DIAGNOSIS — Z8616 Personal history of COVID-19: Secondary | ICD-10-CM

## 2023-04-22 DIAGNOSIS — D75839 Thrombocytosis, unspecified: Secondary | ICD-10-CM | POA: Diagnosis present

## 2023-04-22 DIAGNOSIS — O4292 Full-term premature rupture of membranes, unspecified as to length of time between rupture and onset of labor: Secondary | ICD-10-CM | POA: Diagnosis not present

## 2023-04-22 DIAGNOSIS — F952 Tourette's disorder: Secondary | ICD-10-CM | POA: Diagnosis present

## 2023-04-22 DIAGNOSIS — Z3A39 39 weeks gestation of pregnancy: Secondary | ICD-10-CM

## 2023-04-22 DIAGNOSIS — O99214 Obesity complicating childbirth: Secondary | ICD-10-CM | POA: Diagnosis present

## 2023-04-22 DIAGNOSIS — O99344 Other mental disorders complicating childbirth: Secondary | ICD-10-CM | POA: Diagnosis present

## 2023-04-22 DIAGNOSIS — Z833 Family history of diabetes mellitus: Secondary | ICD-10-CM

## 2023-04-22 DIAGNOSIS — E0590 Thyrotoxicosis, unspecified without thyrotoxic crisis or storm: Secondary | ICD-10-CM | POA: Diagnosis present

## 2023-04-22 DIAGNOSIS — O99210 Obesity complicating pregnancy, unspecified trimester: Secondary | ICD-10-CM | POA: Diagnosis present

## 2023-04-22 DIAGNOSIS — A6000 Herpesviral infection of urogenital system, unspecified: Secondary | ICD-10-CM | POA: Diagnosis present

## 2023-04-22 DIAGNOSIS — Z7982 Long term (current) use of aspirin: Secondary | ICD-10-CM

## 2023-04-22 DIAGNOSIS — O9832 Other infections with a predominantly sexual mode of transmission complicating childbirth: Secondary | ICD-10-CM | POA: Diagnosis present

## 2023-04-22 DIAGNOSIS — O0990 Supervision of high risk pregnancy, unspecified, unspecified trimester: Secondary | ICD-10-CM

## 2023-04-22 DIAGNOSIS — O4290 Premature rupture of membranes, unspecified as to length of time between rupture and onset of labor, unspecified weeks of gestation: Secondary | ICD-10-CM | POA: Diagnosis present

## 2023-04-22 DIAGNOSIS — O9912 Other diseases of the blood and blood-forming organs and certain disorders involving the immune mechanism complicating childbirth: Secondary | ICD-10-CM | POA: Diagnosis present

## 2023-04-22 DIAGNOSIS — O219 Vomiting of pregnancy, unspecified: Secondary | ICD-10-CM | POA: Diagnosis present

## 2023-04-22 LAB — TSH: TSH: 1.341 u[IU]/mL (ref 0.350–4.500)

## 2023-04-22 LAB — TYPE AND SCREEN
ABO/RH(D): O POS
Antibody Screen: NEGATIVE

## 2023-04-22 LAB — CBC
HCT: 43.3 % (ref 36.0–46.0)
Hemoglobin: 13.9 g/dL (ref 12.0–15.0)
MCH: 27 pg (ref 26.0–34.0)
MCHC: 32.1 g/dL (ref 30.0–36.0)
MCV: 84.1 fL (ref 80.0–100.0)
Platelets: 492 10*3/uL — ABNORMAL HIGH (ref 150–400)
RBC: 5.15 MIL/uL — ABNORMAL HIGH (ref 3.87–5.11)
RDW: 14.5 % (ref 11.5–15.5)
WBC: 13.5 10*3/uL — ABNORMAL HIGH (ref 4.0–10.5)
nRBC: 0 % (ref 0.0–0.2)

## 2023-04-22 LAB — POCT FERN TEST: POCT Fern Test: POSITIVE

## 2023-04-22 LAB — T4, FREE: Free T4: 0.81 ng/dL (ref 0.61–1.12)

## 2023-04-22 MED ORDER — ONDANSETRON HCL 4 MG/2ML IJ SOLN
4.0000 mg | Freq: Four times a day (QID) | INTRAMUSCULAR | Status: DC | PRN
  Administered 2023-04-22: 4 mg via INTRAVENOUS
  Filled 2023-04-22: qty 2

## 2023-04-22 MED ORDER — LACTATED RINGERS IV SOLN: INTRAVENOUS | Status: DC

## 2023-04-22 MED ORDER — FAMOTIDINE IN NACL 20-0.9 MG/50ML-% IV SOLN
20.0000 mg | Freq: Once | INTRAVENOUS | Status: AC
  Administered 2023-04-22: 20 mg via INTRAVENOUS
  Filled 2023-04-22: qty 50

## 2023-04-22 NOTE — MAU Note (Signed)
.  Jody Robinson is a 32 y.o. at [redacted]w[redacted]d here in MAU reporting: she started LOF around 1400 this afternoon. The fluid is clear and she is unsure about the consistency, but thinks that it is watery. She has continued to leak since. She does report some lower abdominal and pelvic pressure and reports that it is irregular. She has felt the pain once in the last 30 minutes.   Denies VB, reports good FM. Onset of complaint: Today Pain score: 9/10 There were no vitals filed for this visit.   ZOX:WRUEAV triage/ patient taken directly to room Lab orders placed from triage:  Crist Fat

## 2023-04-22 NOTE — H&P (Signed)
OBSTETRIC ADMISSION HISTORY AND PHYSICAL  Jody Robinson is a 32 y.o. female [phone] with IUP at [redacted]w[redacted]d by LMP presenting for PROM. She reports +FMs, No LOF, no VB, no blurry vision, headaches or peripheral edema, and RUQ pain.  She plans on breast and bottle feeding. She is undecided for birth control. 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 Normal TFT's on 5/1. - fetal urinary tract dilation: I discussed the presence of  mild right-sided urinary tract dilation.  Will continue to follow  with ultrasound. - Tourettes - Obesity BMI   Past Medical History: Past Medical History:  Diagnosis Date   Abnormal Pap smear of cervix    Acute prerenal failure due to hyperemesis and pyelnonephritis(HCC) 10/03/2022   Anemia in pregnancy 03/01/2020   S/p IV feraheme 6/18 and 02/04/2020  CBC Latest Ref Rng & Units 02/06/2020 01/19/2020 12/21/2019 WBC 4.0 - 10.5 K/uL 17.3(H) 13.8(H) 14.9(H) Hemoglobin 12.0 - 15.0 g/dL 8.6(V) 8.3(L) 8.5(L) Hematocrit 36 - 46 % 32.7(L) 26.9(L) 26.7(L) Platelets 150 - 400 K/uL 483(H) 472(H) 559(H)     Depression    Depression    Elevated liver enzymes 10/13/2017   [ ]  rpt cmp with 28wk labs   Gallbladder sludge 08/22/2019   Herpes simplex 01/16/2013   History of 15wk loss (2020) 02/06/2019   [ ]  f/u antiphosphlipid abs future lab (placed 4/22)  Less than 20 weeks   History of abnormal cervical Pap smear 10/09/2017   2020: pap smear wnl   History of COVID-19 09/10/2019   COVID+ 09/08/19 symptomatic    History of herpes  genitalis 10/09/2022   History of IUFD 02/06/2019   Less than 20 weeks   History of renal insufficiency 12/02/2022   Due to pyelo and dehydration but need to trend Cr   Hyperemesis gravidarum with electrolyte imbalance 10/03/2022   Twice weekly infusions-- 1L LR, Phenergan, Zofran, Protonix for 15 weeks of therapy written by Dr Alvester Morin 2.26   Will get CMP x 4 weekly      [ ]  oral potassium?   Migraine headache 11/24/2022   Sees neurologist for migraines who manages Tourette's   Migraine without aura and without status migrainosus, not intractable 05/10/2022   Pancreatitis, acute 08/20/2019   Seasonal allergies 10/09/2017   Thrombocytosis 08/16/2019   CBC  Latest Ref Rng & Units  02/06/2020  01/19/2020  12/21/2019  WBC  4.0 - 10.5 K/uL  17.3(H)  13.8(H)  14.9(H)  Hemoglobin  12.0 - 15.0 g/dL  7.8(I)  8.3(L)  8.5(L)  Hematocrit  36 - 46 %  32.7(L)  26.9(L)  26.7(L)  Platelets  150 - 400 K/uL  483(H)  472(H)  559(H)         Tourette's syndrome    Trichomoniasis     Past Surgical History: Past Surgical History:  Procedure Laterality Date   COLPOSCOPY      Obstetrical History: OB History     Gravida  4   Para  2   Term  2   Preterm      AB  1   Living  2      SAB  1   IAB      Ectopic      Multiple  0   Live Births  2           Social History Social History   Socioeconomic History   Marital status: Single    Spouse name: Not on file   Number of children: Not on file   Years of education: Not on file   Highest education level: Not on file  Occupational History   Not on file  Tobacco Use   Smoking status: Never   Smokeless tobacco: Never  Vaping Use   Vaping status: Never Used  Substance and Sexual Activity   Alcohol use: Not Currently    Comment: occasional   Drug use: Not Currently   Sexual activity: Not Currently    Birth control/protection: None  Other Topics Concern   Not on file  Social History Narrative   Not on file   Social Determinants  of Health   Financial Resource Strain: Not on file  Food Insecurity: No Food Insecurity (03/03/2023)   Hunger Vital Sign    Worried About Running Out of Food in the Last Year: Never true    Ran Out of Food in the Last Year: Never true  Transportation Needs: No Transportation Needs (03/03/2023)   PRAPARE - Administrator, Civil Service (Medical): No    Lack of Transportation (Non-Medical): No  Physical Activity: Not on file  Stress: Not on file  Social Connections: Not on file    Family History: Family History  Problem Relation Age of Onset   Diabetes Mother     Allergies: No Known Allergies  Medications Prior to Admission  Medication Sig Dispense Refill Last Dose   famotidine (PEPCID) 40 MG tablet Take 1 tablet (40 mg total) by mouth 2 (two) times daily. 60 tablet 2 04/21/2023   metoCLOPramide (REGLAN) 10 MG tablet Take 1 tablet (10 mg total) by mouth 4 (four) times daily. 30 tablet 2 Past Month   ondansetron (ZOFRAN-ODT) 4 MG disintegrating tablet Take 1 tablet (4 mg total) by mouth every 6 (six) hours as needed for nausea or vomiting. 30 tablet 3 04/21/2023   pantoprazole (PROTONIX) 40 MG tablet Take 1 tablet (40 mg total) by mouth daily. 30 tablet 3 04/21/2023   promethazine (PHENERGAN) 25 MG tablet Take 1 tablet (25 mg total) by mouth every 6 (six) hours as needed for nausea, vomiting or refractory nausea / vomiting. 30 tablet 2 Past Month   aspirin EC 81 MG tablet Take 1 tablet (81 mg total) by mouth daily. Swallow whole. 30 tablet 12 More than a month   prenatal vitamin w/FE, FA (PRENATAL 1 + 1) 27-1 MG TABS tablet Take 1 tablet by mouth daily at 12 noon. (Patient not taking: Reported on 04/22/2023) 30 tablet 11 Not Taking     Review of Systems   All systems reviewed and negative except as stated in HPI  Blood pressure 138/87, pulse 79, temperature 98.2 F (36.8 C), temperature source Oral, last menstrual period 07/22/2022. General appearance: {general  exam:16600} Lungs: clear to auscultation bilaterally Heart: regular rate and rhythm Abdomen: soft, non-tender; bowel sounds normal Pelvic: *** Extremities: Homans sign is negative, no sign of DVT DTR's *** Presentation: {desc; fetal presentation:14558} Fetal monitoring{findings; monitor fetal heart monitor:31527} Uterine activity{Uterine contractions:31516}     Prenatal labs: ABO, Rh: --/--/O POS (07/22 2015) Antibody: NEG (07/22  2015) Rubella: 1.67 (02/28 1044) RPR: Non Reactive (06/18 0924)  HBsAg: Negative (02/28 1044)  HIV: Non Reactive (06/18 0924)  GBS: Negative/-- (08/20 1253)  2 hr Glucola normal Genetic screening negative AFP, low risk female NIPS, Horizon not done Anatomy US mild renal pelvis dilation  Prenatal Transfer Tool  Maternal Diabetes: No Genetic Screening: Normal Maternal Ultrasounds/Referrals: Normal Fetal Ultrasounds or other Referrals:  Other: mild renal pelvis dilation Maternal Substance Abuse:  No Significant Maternal Medications:  None Significant Maternal Lab Results:  Group B Strep negative Number of Prenatal Visits:greater than 3 verified prenatal visits Other Comments:  None  Results for orders placed or performed during the hospital encounter of 04/22/23 (from the past 24 hour(s))  POCT fern test   Collection Time: 04/22/23  5:43 PM  Result Value Ref Range   POCT Fern Test Positive = ruptured amniotic membanes     Patient Active Problem List   Diagnosis Date Noted   Nausea and vomiting of pregnancy, antepartum 03/04/2023   Obesity affecting pregnancy 11/25/2022   Supervision of high risk pregnancy, antepartum 10/08/2022   Tourette's disease 05/10/2022   Hyperthyroidism affecting pregnancy in second trimester 09/17/2019   Thrombocytosis 08/16/2019    Assessment/Plan:  Jody Robinson is a 32 y.o. [id number] at [redacted]w[redacted]d here for PROM  #Labor: Expectant management until moved up to L&D, then augmentation with Pitocin #Pain: At patient  request #FWB: Cat I #ID:  GBS negative #MOF: bottle and breast #MOC: undecided, considering post placental IUD  Wyn Forster, MD  04/22/2023, 5:52 PM

## 2023-04-23 ENCOUNTER — Inpatient Hospital Stay (HOSPITAL_COMMUNITY): Payer: 59 | Admitting: Anesthesiology

## 2023-04-23 ENCOUNTER — Encounter (HOSPITAL_COMMUNITY): Payer: Self-pay | Admitting: Obstetrics and Gynecology

## 2023-04-23 DIAGNOSIS — O9832 Other infections with a predominantly sexual mode of transmission complicating childbirth: Secondary | ICD-10-CM | POA: Diagnosis not present

## 2023-04-23 DIAGNOSIS — A6000 Herpesviral infection of urogenital system, unspecified: Secondary | ICD-10-CM | POA: Diagnosis present

## 2023-04-23 DIAGNOSIS — Z8616 Personal history of COVID-19: Secondary | ICD-10-CM | POA: Diagnosis not present

## 2023-04-23 DIAGNOSIS — O99284 Endocrine, nutritional and metabolic diseases complicating childbirth: Secondary | ICD-10-CM | POA: Diagnosis not present

## 2023-04-23 DIAGNOSIS — Z3A39 39 weeks gestation of pregnancy: Secondary | ICD-10-CM | POA: Diagnosis not present

## 2023-04-23 DIAGNOSIS — Z7982 Long term (current) use of aspirin: Secondary | ICD-10-CM | POA: Diagnosis not present

## 2023-04-23 DIAGNOSIS — O9912 Other diseases of the blood and blood-forming organs and certain disorders involving the immune mechanism complicating childbirth: Secondary | ICD-10-CM | POA: Diagnosis not present

## 2023-04-23 DIAGNOSIS — D75839 Thrombocytosis, unspecified: Secondary | ICD-10-CM | POA: Diagnosis present

## 2023-04-23 DIAGNOSIS — O4292 Full-term premature rupture of membranes, unspecified as to length of time between rupture and onset of labor: Secondary | ICD-10-CM | POA: Diagnosis present

## 2023-04-23 DIAGNOSIS — O99344 Other mental disorders complicating childbirth: Secondary | ICD-10-CM | POA: Diagnosis present

## 2023-04-23 DIAGNOSIS — O4202 Full-term premature rupture of membranes, onset of labor within 24 hours of rupture: Secondary | ICD-10-CM | POA: Diagnosis not present

## 2023-04-23 DIAGNOSIS — F952 Tourette's disorder: Secondary | ICD-10-CM | POA: Diagnosis present

## 2023-04-23 DIAGNOSIS — O99214 Obesity complicating childbirth: Secondary | ICD-10-CM | POA: Diagnosis present

## 2023-04-23 DIAGNOSIS — O4290 Premature rupture of membranes, unspecified as to length of time between rupture and onset of labor, unspecified weeks of gestation: Secondary | ICD-10-CM | POA: Diagnosis present

## 2023-04-23 DIAGNOSIS — Z833 Family history of diabetes mellitus: Secondary | ICD-10-CM | POA: Diagnosis not present

## 2023-04-23 LAB — RPR: RPR Ser Ql: NONREACTIVE

## 2023-04-23 MED ORDER — LIDOCAINE HCL (PF) 1 % IJ SOLN
INTRAMUSCULAR | Status: DC | PRN
  Administered 2023-04-23: 5 mL via EPIDURAL

## 2023-04-23 MED ORDER — DIPHENHYDRAMINE HCL 50 MG/ML IJ SOLN: 12.5000 mg | INTRAMUSCULAR | Status: DC | PRN

## 2023-04-23 MED ORDER — ZOLPIDEM TARTRATE 5 MG PO TABS: 5.0000 mg | ORAL_TABLET | Freq: Every evening | ORAL | Status: DC | PRN

## 2023-04-23 MED ORDER — FAMOTIDINE 20 MG PO TABS
20.0000 mg | ORAL_TABLET | Freq: Two times a day (BID) | ORAL | Status: DC
  Administered 2023-04-23 – 2023-04-24 (×3): 20 mg via ORAL
  Filled 2023-04-23 (×3): qty 1

## 2023-04-23 MED ORDER — FENTANYL-BUPIVACAINE-NACL 0.5-0.125-0.9 MG/250ML-% EP SOLN
12.0000 mL/h | EPIDURAL | Status: DC | PRN
  Filled 2023-04-23: qty 250

## 2023-04-23 MED ORDER — SOD CITRATE-CITRIC ACID 500-334 MG/5ML PO SOLN: 30.0000 mL | ORAL | Status: DC | PRN

## 2023-04-23 MED ORDER — SENNOSIDES-DOCUSATE SODIUM 8.6-50 MG PO TABS
2.0000 | ORAL_TABLET | Freq: Every day | ORAL | Status: DC
  Administered 2023-04-24: 2 via ORAL
  Filled 2023-04-23: qty 2

## 2023-04-23 MED ORDER — PHENYLEPHRINE 80 MCG/ML (10ML) SYRINGE FOR IV PUSH (FOR BLOOD PRESSURE SUPPORT): 80.0000 ug | PREFILLED_SYRINGE | INTRAVENOUS | Status: DC | PRN

## 2023-04-23 MED ORDER — COCONUT OIL OIL: 1.0000 | TOPICAL_OIL | Status: DC | PRN

## 2023-04-23 MED ORDER — ONDANSETRON HCL 4 MG/2ML IJ SOLN: 4.0000 mg | INTRAMUSCULAR | Status: DC | PRN

## 2023-04-23 MED ORDER — LIDOCAINE HCL (PF) 1 % IJ SOLN: 30.0000 mL | INTRAMUSCULAR | Status: DC | PRN

## 2023-04-23 MED ORDER — SIMETHICONE 80 MG PO CHEW: 80.0000 mg | CHEWABLE_TABLET | ORAL | Status: DC | PRN

## 2023-04-23 MED ORDER — LACTATED RINGERS IV SOLN: 500.0000 mL | Freq: Once | INTRAVENOUS | Status: DC

## 2023-04-23 MED ORDER — IBUPROFEN 600 MG PO TABS
600.0000 mg | ORAL_TABLET | Freq: Four times a day (QID) | ORAL | Status: DC
  Administered 2023-04-23 – 2023-04-24 (×4): 600 mg via ORAL
  Filled 2023-04-23 (×5): qty 1

## 2023-04-23 MED ORDER — WITCH HAZEL-GLYCERIN EX PADS: 1.0000 | MEDICATED_PAD | CUTANEOUS | Status: DC | PRN

## 2023-04-23 MED ORDER — EPHEDRINE 5 MG/ML INJ: 10.0000 mg | INTRAVENOUS | Status: DC | PRN

## 2023-04-23 MED ORDER — OXYTOCIN-SODIUM CHLORIDE 30-0.9 UT/500ML-% IV SOLN
2.5000 [IU]/h | INTRAVENOUS | Status: DC
  Filled 2023-04-23: qty 500

## 2023-04-23 MED ORDER — DIBUCAINE (PERIANAL) 1 % EX OINT: 1.0000 | TOPICAL_OINTMENT | CUTANEOUS | Status: DC | PRN

## 2023-04-23 MED ORDER — FENTANYL-BUPIVACAINE-NACL 0.5-0.125-0.9 MG/250ML-% EP SOLN
EPIDURAL | Status: DC | PRN
  Administered 2023-04-23: 12 mL/h via EPIDURAL

## 2023-04-23 MED ORDER — CALCIUM CARBONATE ANTACID 500 MG PO CHEW
2.0000 | CHEWABLE_TABLET | Freq: Three times a day (TID) | ORAL | Status: DC
  Administered 2023-04-23 – 2023-04-24 (×3): 400 mg via ORAL
  Filled 2023-04-23 (×3): qty 2

## 2023-04-23 MED ORDER — BENZOCAINE-MENTHOL 20-0.5 % EX AERO: 1.0000 | INHALATION_SPRAY | CUTANEOUS | Status: DC | PRN

## 2023-04-23 MED ORDER — TETANUS-DIPHTH-ACELL PERTUSSIS 5-2.5-18.5 LF-MCG/0.5 IM SUSY: 0.5000 mL | PREFILLED_SYRINGE | Freq: Once | INTRAMUSCULAR | Status: DC

## 2023-04-23 MED ORDER — PRENATAL MULTIVITAMIN CH
1.0000 | ORAL_TABLET | Freq: Every day | ORAL | Status: DC
  Administered 2023-04-23 – 2023-04-24 (×2): 1 via ORAL
  Filled 2023-04-23 (×2): qty 1

## 2023-04-23 MED ORDER — DIPHENHYDRAMINE HCL 25 MG PO CAPS
25.0000 mg | ORAL_CAPSULE | Freq: Four times a day (QID) | ORAL | Status: DC | PRN
  Administered 2023-04-24: 25 mg via ORAL
  Filled 2023-04-23: qty 1

## 2023-04-23 MED ORDER — LACTATED RINGERS IV SOLN: 500.0000 mL | INTRAVENOUS | Status: DC | PRN

## 2023-04-23 MED ORDER — FENTANYL CITRATE (PF) 100 MCG/2ML IJ SOLN: 50.0000 ug | INTRAMUSCULAR | Status: DC | PRN

## 2023-04-23 MED ORDER — ACETAMINOPHEN 325 MG PO TABS: 650.0000 mg | ORAL_TABLET | ORAL | Status: DC | PRN

## 2023-04-23 MED ORDER — OXYTOCIN BOLUS FROM INFUSION
333.0000 mL | Freq: Once | INTRAVENOUS | Status: AC
  Administered 2023-04-23: 333 mL via INTRAVENOUS

## 2023-04-23 MED ORDER — ONDANSETRON HCL 4 MG PO TABS: 4.0000 mg | ORAL_TABLET | ORAL | Status: DC | PRN

## 2023-04-23 NOTE — Anesthesia Procedure Notes (Signed)
Epidural Patient location during procedure: OB Start time: 04/23/2023 1:37 AM End time: 04/23/2023 1:49 AM  Staffing Anesthesiologist: Trevor Iha, MD Performed: anesthesiologist   Preanesthetic Checklist Completed: patient identified, IV checked, site marked, risks and benefits discussed, surgical consent, monitors and equipment checked, pre-op evaluation and timeout performed  Epidural Patient position: sitting Prep: DuraPrep and site prepped and draped Patient monitoring: continuous pulse ox and blood pressure Approach: midline Location: L3-L4 Injection technique: LOR air  Needle:  Needle type: Tuohy  Needle gauge: 17 G Needle length: 9 cm and 9 Needle insertion depth: 5 cm cm Catheter type: closed end flexible Catheter size: 19 Gauge Catheter at skin depth: 10 cm Test dose: negative  Assessment Events: blood not aspirated, no cerebrospinal fluid, injection not painful, no injection resistance, no paresthesia and negative IV test  Additional Notes Patient identified. Risks/Benefits/Options discussed with patient including but not limited to bleeding, infection, nerve damage, paralysis, failed block, incomplete pain control, headache, blood pressure changes, nausea, vomiting, reactions to medication both or allergic, itching and postpartum back pain. Confirmed with bedside nurse the patient's most recent platelet count. Confirmed with patient that they are not currently taking any anticoagulation, have any bleeding history or any family history of bleeding disorders. Patient expressed understanding and wished to proceed. All questions were answered. Sterile technique was used throughout the entire procedure. Please see nursing notes for vital signs. Test dose was given through epidural needle and negative prior to continuing to dose epidural or start infusion. Warning signs of high block given to the patient including shortness of breath, tingling/numbness in hands, complete  motor block, or any concerning symptoms with instructions to call for help. Patient was given instructions on fall risk and not to get out of bed. All questions and concerns addressed with instructions to call with any issues.  1 Attempt (S) . Patient tolerated procedure well.

## 2023-04-23 NOTE — Lactation Note (Signed)
This note was copied from a baby's chart. Lactation Consultation Note  Patient Name: Jody Robinson [id number] Date: 04/23/2023 Age:33 hours Reason for consult: Initial assessment  P3, Mother states she plans to breastfeed and formula feed and will pump once home.   Infant has been sleepy and not waking for feeds and taken small amounts of formula.  When LC assessed suck      Maternal Data Has patient been taught Hand Expression?: Yes Does the patient have breastfeeding experience prior to this delivery?: Yes How long did the patient breastfeed?: 4 mos. with her last daughter  Feeding Mother's Current Feeding Choice: Breast Milk and Formula  Interventions Interventions: Breast feeding basics reviewed;Assisted with latch;Skin to skin;Hand express;Adjust position;Support pillows;Education  Discharge Pump: Personal;Hands Free  Consult Status Consult Status: Follow-up Date: 04/24/23 Follow-up type: In-patient    Dahlia Byes [facility] 04/23/2023, 4:44 PM

## 2023-04-23 NOTE — Anesthesia Postprocedure Evaluation (Signed)
Anesthesia Post Note  Patient: Chief Executive Officer  Procedure(s) Performed: AN AD HOC LABOR EPIDURAL     Patient location during evaluation: Mother Baby Anesthesia Type: Epidural Level of consciousness: awake Pain management: satisfactory to patient Vital Signs Assessment: post-procedure vital signs reviewed and stable Respiratory status: spontaneous breathing Cardiovascular status: stable Anesthetic complications: no  No notable events documented.  Last Vitals:  Vitals:   04/23/23 0628 04/23/23 1100  BP: 113/68 110/70  Pulse: (!) 46 60  Resp: 18 18  Temp: 36.4 C 36.7 C  SpO2: 100% 100%    Last Pain:  Vitals:   04/23/23 1145  TempSrc:   PainSc: 0-No pain   Pain Goal:                   [employer]

## 2023-04-23 NOTE — Discharge Summary (Signed)
Postpartum Discharge Summary  Date of Service updated***     Patient Name: Jody Robinson DOB: 1990/12/12 MRN: [id number]  Date of admission: 04/22/2023 Delivery date:04/23/2023 Delivering provider: Joanne Gavel Date of discharge: 04/23/2023  Admitting diagnosis: Leakage of amniotic fluid [O42.90] Intrauterine pregnancy: [redacted]w[redacted]d     Secondary diagnosis:  Principal Problem:   NSVD (normal spontaneous vaginal delivery) Active Problems:   Thrombocytosis   Hyperthyroidism affecting pregnancy in second trimester   Tourette's disease   Supervision of high risk pregnancy, antepartum   Obesity affecting pregnancy   Nausea and vomiting of pregnancy, antepartum   Leakage of amniotic fluid  Additional problems: ***    Discharge diagnosis: Term Pregnancy Delivered                                              Post partum procedures:*** Augmentation:  None Complications: None  Hospital course: Onset of Labor With Vaginal Delivery      32 y.o. yo [id number] at [redacted]w[redacted]d was admitted in Latent Labor on 04/22/2023. Labor course was complicated by none  Membrane Rupture Time/Date: 2:00 PM,04/22/2023  Delivery Method:Vaginal, Spontaneous Operative Delivery:N/A Episiotomy: None Lacerations:  None Patient had a postpartum course complicated by ***.  She is ambulating, tolerating a regular diet, passing flatus, and urinating well. Patient is discharged home in stable condition on 04/23/23.  Newborn Data: Birth date:04/23/2023 Birth time:4:57 AM Gender:Female Living status:Living Apgars: ,  Weight:   Magnesium Sulfate received: No BMZ received: No Rhophylac:No MMR:No T-DaP:Given prenatally Flu: No Transfusion:No  Physical exam  Vitals:   04/23/23 0231 04/23/23 0302 04/23/23 0332 04/23/23 0401  BP: 120/77 108/70 100/73 (!) 98/52  Pulse: 71 73 90 61  Temp:      TempSrc:      SpO2:      Weight:      Height:       General: {Exam; general:21111117} Lochia: {Desc;  appropriate/inappropriate:30686::"appropriate"} Uterine Fundus: {Desc; firm/soft:30687} Incision: {Exam; incision:21111123} DVT Evaluation: {Exam; dvt:2111122} Labs: Lab Results  Component Value Date   WBC 13.5 (H) 04/22/2023   HGB 13.9 04/22/2023   HCT 43.3 04/22/2023   MCV 84.1 04/22/2023   PLT 492 (H) 04/22/2023      Latest Ref Rng & Units 03/07/2023    7:04 PM  CMP  Glucose 70 - 99 mg/dL 76   BUN 6 - 20 mg/dL 5   Creatinine 1.47 - 8.29 mg/dL 5.62   Sodium 130 - 865 mmol/L 139   Potassium 3.5 - 5.1 mmol/L 3.2   Chloride 98 - 111 mmol/L 101   CO2 22 - 32 mmol/L 19   Calcium 8.9 - 10.3 mg/dL 8.6   Total Protein 6.5 - 8.1 g/dL 6.8   Total Bilirubin 0.3 - 1.2 mg/dL 1.7   Alkaline Phos 38 - 126 U/L 140   AST 15 - 41 U/L 24   ALT 0 - 44 U/L 17    Edinburgh Score:    04/06/2020    3:56 PM  Edinburgh Postnatal Depression Scale Screening Tool  I have been able to laugh and see the funny side of things. 0  I have looked forward with enjoyment to things. 1  I have blamed myself unnecessarily when things went wrong. 0  I have been anxious or worried for no good reason. 0  I have felt scared or panicky for no  good reason. 0  Things have been getting on top of me. 0  I have been so unhappy that I have had difficulty sleeping. 0  I have felt sad or miserable. 1  I have been so unhappy that I have been crying. 0  The thought of harming myself has occurred to me. 0  Edinburgh Postnatal Depression Scale Total 2     After visit meds:  Allergies as of 04/23/2023   No Known Allergies   Med Rec must be completed prior to using this [facility]***        Discharge home in stable condition Infant Feeding: {Baby feeding:23562} Infant Disposition:{CHL IP OB HOME WITH ZOXWRU:04540} Discharge instruction: per After Visit Summary and Postpartum booklet. Activity: Advance as tolerated. Pelvic rest for 6 weeks.  Diet: {OB JWJX:[id number]} Future Appointments: Future Appointments   Date Time Provider Department Center  04/24/2023  9:35 AM Hermina Staggers, MD [facility]  10/23/2023 11:30 AM [facility] LAB [facility] None  10/30/2023 11:40 AM Jaci Standard, MD [facility] None   Follow up Visit:  Message sent to [facility] 9/11  Please schedule this patient for a In person postpartum visit in 6 weeks with the following provider: Any provider. Additional Postpartum F/U: None   High risk pregnancy complicated by:  hyperthyroid Delivery mode:  Vaginal, Spontaneous Anticipated Birth Control:  IUD   04/23/2023 Joanne Gavel, MD

## 2023-04-23 NOTE — Anesthesia Preprocedure Evaluation (Signed)
Anesthesia Evaluation  Patient identified by MRN, date of birth, ID band Patient awake    Reviewed: Allergy & Precautions, H&P , NPO status , Patient's Chart, lab work & pertinent test results  Airway Mallampati: II  TM Distance: >3 FB Neck ROM: Full    Dental no notable dental hx. (+) Teeth Intact, Dental Advisory Given   Pulmonary neg pulmonary ROS   Pulmonary exam normal breath sounds clear to auscultation       Cardiovascular negative cardio ROS Normal cardiovascular exam Rhythm:Regular Rate:Normal     Neuro/Psych  Headaches Tourettes negative neurological ROS  negative psych ROS   GI/Hepatic negative GI ROS,,,  Endo/Other    Renal/GU negative Renal ROS  negative genitourinary   Musculoskeletal negative musculoskeletal ROS (+)    Abdominal   Peds negative pediatric ROS (+)  Hematology Lab Results      Component                Value               Date                      WBC                      13.5 (H)            04/22/2023                HGB                      13.9                04/22/2023                HCT                      43.3                04/22/2023                MCV                      84.1                04/22/2023                PLT                      492 (H)             04/22/2023              Anesthesia Other Findings   Reproductive/Obstetrics (+) Pregnancy                              Anesthesia Physical Anesthesia Plan  ASA: 2  Anesthesia Plan: Epidural   Post-op Pain Management:    Induction:   PONV Risk Score and Plan:   Airway Management Planned:   Additional Equipment:   Intra-op Plan:   Post-operative Plan:   Informed Consent: I have reviewed the patients History and Physical, chart, labs and discussed the procedure including the risks, benefits and alternatives for the proposed anesthesia with the patient or authorized  representative who has indicated his/her understanding and acceptance.       Plan Discussed with:  Anesthesia Plan Comments: (39.2 wk G4P2 for LEA)         Anesthesia Quick Evaluation

## 2023-04-24 ENCOUNTER — Encounter: Payer: 59 | Admitting: Obstetrics and Gynecology

## 2023-04-24 MED ORDER — DOCUSATE SODIUM 100 MG PO CAPS: 100.0000 mg | ORAL_CAPSULE | Freq: Two times a day (BID) | ORAL | 0 refills | Status: DC

## 2023-04-24 NOTE — Progress Notes (Signed)
CSW was informed by support staff that MOB needed support with transportation to infant's first pediatrician appt. CSW met MOB at bedside to get further clarification with transportation issues. MOB reported using the medicaid transportation for support; however the appointment is scheduled for tomorrow which would be short notice for medicaid transportation. CSW asked MOB if family and friends could be supportive for transportation; MOB reported they are all busy with working tomorrow.   CSW provided a taxi voucher to support her transportation needs to the pediatrician appointment and back home once the appointment is complete.   CSW identifies no further need for intervention and no barriers to discharge at this time.  Enos Fling, Theresia Majors Clinical Social Worker [phone]

## 2023-04-24 NOTE — Progress Notes (Signed)
POSTPARTUM PROGRESS NOTE  Subjective: Jody Robinson is a 32 y.o. [id number] s/p SVD at [redacted]w[redacted]d.  She reports she is doing well. No acute events overnight. She denies any problems with ambulating, voiding or po intake. Denies nausea or vomiting. She has passed flatus. Pain is well controlled.  Lochia is light.  Objective: Blood pressure 113/84, pulse 65, temperature 98.5 F (36.9 C), temperature source Oral, resp. rate 16, height 5' (1.524 m), weight 63.5 kg, last menstrual period 07/22/2022, SpO2 99%, unknown if currently breastfeeding.  Physical Exam:  General: alert, cooperative and no distress Chest: no respiratory distress Abdomen: soft, non-tender  Uterine Fundus: firm and at level of umbilicus Extremities: No calf swelling or tenderness  Trace edema  Recent Labs    04/22/23 1759  HGB 13.9  HCT 43.3    Assessment/Plan: Jody Robinson is a 32 y.o. [id number] s/p NSVD at [redacted]w[redacted]d.  Routine Postpartum Care: Doing well, pain well-controlled.  -- Continue routine care, lactation support  -- Contraception: IUD PP -- Feeding: breast  Dispo: Plan for discharge this afternoon or tomorrow; pending baby discharge.  Joanne Gavel, MD OB Fellow 04/24/2023 7:31 AM

## 2023-05-22 ENCOUNTER — Telehealth (HOSPITAL_COMMUNITY): Payer: Self-pay | Admitting: *Deleted

## 2023-05-22 NOTE — Telephone Encounter (Signed)
05/22/2023  Name: Jody Robinson MRN: [id number] DOB: 12-31-1990  Reason for Call:  Transition of Care Hospital Discharge Call  Contact Status: Patient Contact Status: Message  Language assistant needed:          Follow-Up Questions:    Jody Robinson Postnatal Depression Scale:  In the Past 7 Days:    PHQ2-9 Depression Scale:     Discharge Follow-up:    Post-discharge interventions: NA  Jody Karabin,RN  05/22/2023 1500

## 2023-06-04 ENCOUNTER — Ambulatory Visit: Payer: 59 | Admitting: Obstetrics & Gynecology

## 2023-06-04 ENCOUNTER — Other Ambulatory Visit: Payer: Self-pay

## 2023-06-04 ENCOUNTER — Encounter: Payer: Self-pay | Admitting: Obstetrics & Gynecology

## 2023-06-04 NOTE — Progress Notes (Signed)
Post Partum Visit Note  Jody Robinson is a 32 y.o. [phone] female who presents for a postpartum visit. She is 6 weeks postpartum following a normal spontaneous vaginal delivery.  I have fully reviewed the prenatal and intrapartum course. The delivery was at 39/2 gestational weeks.  Anesthesia: epidural. Postpartum course has been uncomplicated. Baby is doing well. Baby is feeding by both breast and bottle - [employer] Good Start. Bleeding no bleeding. Bowel function is normal. Bladder function is normal. Patient is not sexually active. Contraception method is none. Postpartum depression screening: negative.   The pregnancy intention screening data noted above was reviewed. Potential methods of contraception were discussed. The patient elected to proceed with abstinence.   Edinburgh Postnatal Depression Scale - 06/04/23 1537       Edinburgh Postnatal Depression Scale:  In the Past 7 Days   I have been able to laugh and see the funny side of things. 0    I have looked forward with enjoyment to things. 0    I have blamed myself unnecessarily when things went wrong. 0    I have been anxious or worried for no good reason. 0    I have felt scared or panicky for no good reason. 0    Things have been getting on top of me. 0    I have been so unhappy that I have had difficulty sleeping. 0    I have felt sad or miserable. 0    The thought of harming myself has occurred to me. 0             Health Maintenance Due  Topic Date Due   INFLUENZA VACCINE  Never done   COVID-19 Vaccine (1 - 2023-24 season) Never done    The following portions of the patient's history were reviewed and updated as appropriate: allergies, current medications, past family history, past medical history, past social history, past surgical history, and problem list.  Review of Systems Pertinent items noted in HPI and remainder of comprehensive ROS otherwise negative.  Objective:  BP 127/83   Pulse 79   Ht 5'  1" (1.549 m)   Wt 149 lb 4.8 oz (67.7 kg)   LMP 07/22/2022 (Exact Date)   Breastfeeding Yes Comment: Bottle also  BMI 28.21 kg/m    General:  alert and no distress   Breasts:  Deferred by patient  Lungs: clear to auscultation bilaterally  Heart:  regular rate and rhythm  Abdomen: soft, non-tender; bowel sounds normal; no masses,  no organomegaly   GU exam:  not indicated       Assessment:   Normal postpartum exam.   Plan:   Essential components of care per ACOG recommendations:  1.  Mood and well being: Patient with negative depression screening today. Reviewed local resources for support.  - Patient tobacco use? No.   - hx of drug use? No.    2. Infant care and feeding:  -Patient currently breastmilk feeding? Yes. Reviewed importance of draining breast regularly to support lactation.  -Social determinants of health (SDOH) reviewed in EPIC. No concerns.  3. Sexuality, contraception and birth spacing - Patient does not want a pregnancy in the next year.  Desired family size is 4 children.  - Reviewed reproductive life planning. Reviewed contraceptive methods based on pt preferences and effectiveness.  Patient desired Abstinence today.  May come back for IUD later. - Discussed birth spacing of 18 months  4. Sleep and fatigue -Encouraged family/partner/community support of  4 hrs of uninterrupted sleep to help with mood and fatigue  5. Physical Recovery  - Discussed patients delivery and complications. She describes her labor as good. - Patient had a Vaginal, no problems at delivery. Patient had no laceration. Perineal healing reviewed. Patient expressed understanding - Patient has urinary incontinence? No. - Patient is safe to resume physical and sexual activity  6.  Health Maintenance - HM due items addressed Yes - Last pap smear  Diagnosis  Date Value Ref Range Status  10/09/2022   Final   - Negative for intraepithelial lesion or malignancy (NILM)   Pap smear not  done at today's visit.  -Breast Cancer screening indicated? No.   7. Chronic Disease/Pregnancy Condition follow up: Hyperthyroidism - No medications - PCP follow up    Jaynie Collins, MD [facility], [facility]

## 2023-09-07 ENCOUNTER — Encounter (HOSPITAL_BASED_OUTPATIENT_CLINIC_OR_DEPARTMENT_OTHER): Payer: Self-pay

## 2023-09-07 ENCOUNTER — Emergency Department (HOSPITAL_BASED_OUTPATIENT_CLINIC_OR_DEPARTMENT_OTHER)
Admission: EM | Admit: 2023-09-07 | Discharge: 2023-09-07 | Disposition: A | Discharge status: Home / Self Care | Payer: 59 | Attending: Emergency Medicine | Admitting: Emergency Medicine

## 2023-09-07 ENCOUNTER — Other Ambulatory Visit: Payer: Self-pay

## 2023-09-07 DIAGNOSIS — B9689 Other specified bacterial agents as the cause of diseases classified elsewhere: Secondary | ICD-10-CM

## 2023-09-07 DIAGNOSIS — N898 Other specified noninflammatory disorders of vagina: Secondary | ICD-10-CM | POA: Diagnosis present

## 2023-09-07 DIAGNOSIS — B3731 Acute candidiasis of vulva and vagina: Secondary | ICD-10-CM | POA: Insufficient documentation

## 2023-09-07 DIAGNOSIS — N760 Acute vaginitis: Secondary | ICD-10-CM | POA: Diagnosis not present

## 2023-09-07 LAB — WET PREP, GENITAL
Sperm: NONE SEEN
Trich, Wet Prep: NONE SEEN
WBC, Wet Prep HPF POC: <10 (ref <10)

## 2023-09-07 LAB — URINALYSIS, ROUTINE W REFLEX MICROSCOPIC
Bilirubin Urine: NEGATIVE
Glucose, UA: NEGATIVE mg/dL
Ketones, ur: NEGATIVE mg/dL
Nitrite: NEGATIVE
Protein, ur: NEGATIVE mg/dL
Specific Gravity, Urine: 1.01 (ref 1.005–1.030)
pH: 6.5 (ref 5.0–8.0)

## 2023-09-07 LAB — PREGNANCY, URINE: Preg Test, Ur: NEGATIVE

## 2023-09-07 LAB — URINALYSIS, MICROSCOPIC (REFLEX)

## 2023-09-07 MED ORDER — METRONIDAZOLE 500 MG PO TABS: 500.0000 mg | ORAL_TABLET | Freq: Two times a day (BID) | ORAL | 0 refills | Status: DC

## 2023-09-07 MED ORDER — FLUCONAZOLE 150 MG PO TABS: 150.0000 mg | ORAL_TABLET | Freq: Every day | ORAL | 0 refills | Status: DC

## 2023-09-07 NOTE — ED Provider Notes (Signed)
[facility] Provider Note   CSN: [id number] Arrival date & time: 09/07/23  7829     History  Chief Complaint  Patient presents with   Vaginal Itching    Jody Robinson is a 33 y.o. female.  33 yo F with PMHx of Tourette's syndrome presents to ER for vaginal itching x 2 days. On Friday, pt reports experiencing vaginal itching after taking a shower. The pt did not try any new products is believes this feels similar to previous yeast infections. She notes increased amount of clear discharge but nothing of color or with an odor. She denies any recent antibiotic use. Pt denies any fever, dysuria, urinary frequency, or abdominal pain. Pt is currently sexually active and not using any form of contraception. She is requesting testing for gonorrhea/chlamydia and a pregnancy.  The history is provided by the patient. No language interpreter was used.  Vaginal Itching       Home Medications Prior to Admission medications   Medication Sig Start Date End Date Taking? Authorizing Provider  fluconazole (DIFLUCAN) 150 MG tablet Take 1 tablet (150 mg total) by mouth daily. 09/07/23  Yes Cheron Schaumann K, PA-C  metroNIDAZOLE (FLAGYL) 500 MG tablet Take 1 tablet (500 mg total) by mouth 2 (two) times daily. 09/07/23  Yes Cheron Schaumann K, PA-C  famotidine (PEPCID) 40 MG tablet Take 1 tablet (40 mg total) by mouth 2 (two) times daily. 03/05/23   Lazaro Arms, MD  prenatal vitamin w/FE, FA (PRENATAL 1 + 1) 27-1 MG TABS tablet Take 1 tablet by mouth daily at 12 noon. 10/08/22   [place] Bing, MD      Allergies    Patient has no known allergies.    Review of Systems   Review of Systems  Genitourinary:  Positive for vaginal discharge.  All other systems reviewed and are negative.   Physical Exam Updated Vital Signs BP 106/72   Pulse 86   Temp 98.1 F (36.7 C)   Resp 17   Ht 5' (1.524 m)   Wt 68 kg   LMP 08/12/2023 (Exact Date)   SpO2 100%    Breastfeeding No   BMI 29.29 kg/m  Physical Exam Vitals and nursing note reviewed.  Constitutional:      Appearance: She is well-developed.  HENT:     Head: Normocephalic.     Nose: Nose normal.     Mouth/Throat:     Mouth: Mucous membranes are moist.  Eyes:     Pupils: Pupils are equal, round, and reactive to light.  Cardiovascular:     Rate and Rhythm: Normal rate.     Pulses: Normal pulses.  Pulmonary:     Effort: Pulmonary effort is normal.  Abdominal:     General: There is no distension.  Musculoskeletal:        General: Normal range of motion.     Cervical back: Normal range of motion.  Skin:    General: Skin is warm.  Neurological:     General: No focal deficit present.     Mental Status: She is alert and oriented to person, place, and time.  Psychiatric:        Mood and Affect: Mood normal.     ED Results / Procedures / Treatments   Labs (all labs ordered are listed, but only abnormal results are displayed) Labs Reviewed  WET PREP, GENITAL - Abnormal; Notable for the following components:      Result Value  Yeast Wet Prep HPF POC PRESENT (*)    Clue Cells Wet Prep HPF POC PRESENT (*)    All other components within normal limits  URINALYSIS, ROUTINE W REFLEX MICROSCOPIC - Abnormal; Notable for the following components:   Hgb urine dipstick SMALL (*)    Leukocytes,Ua MODERATE (*)    All other components within normal limits  URINALYSIS, MICROSCOPIC (REFLEX) - Abnormal; Notable for the following components:   Bacteria, UA FEW (*)    All other components within normal limits  PREGNANCY, URINE  GC/CHLAMYDIA PROBE AMP ([place]) NOT AT [facility]    EKG None  Radiology No results found.  Procedures Procedures    Medications Ordered in ED Medications - No data to display  ED Course/ Medical Decision Making/ A&P Clinical Course as of 09/07/23 1808  Wynelle Link Sep 07, 2023  1054 GC/Chlamydia probe amp ([place])not at [facility] [LS]    Clinical Course User  Index [LS] Elson Areas, [place]                                 Medical Decision Making Patient complains of a vaginal discharge.  Patient is concerned that she could have BV or yeast  Amount and/or Complexity of Data Reviewed Labs: ordered. Decision-making details documented in ED Course.    Details: Labs ordered reviewed and interpreted wet prep is positive for bacterial vaginitis and yeast  Risk Prescription drug management.           Final Clinical Impression(s) / ED Diagnoses Final diagnoses:  BV (bacterial vaginosis)  Yeast infection of the vagina    Rx / DC Orders ED Discharge Orders          Ordered    metroNIDAZOLE (FLAGYL) 500 MG tablet  2 times daily        09/07/23 1254    fluconazole (DIFLUCAN) 150 MG tablet  Daily        09/07/23 1254           An After Visit Summary was printed and given to the patient.    Elson Areas, [place] 09/07/23 1811    Terald Sleeper, MD 09/09/23 [phone]

## 2023-09-07 NOTE — ED Triage Notes (Signed)
Pt thinks she has a yeast infection, reports vaginal irritation and itching. Denies foul smell or dysuria

## 2023-09-08 LAB — GC/CHLAMYDIA PROBE AMP (~~LOC~~) NOT AT ARMC
Chlamydia: NEGATIVE
Comment: NEGATIVE
Comment: NORMAL
Neisseria Gonorrhea: NEGATIVE

## 2023-10-17 ENCOUNTER — Telehealth: Payer: Self-pay | Admitting: Family Medicine

## 2023-10-17 NOTE — Telephone Encounter (Signed)
 Patient was told to call to get birth control patches sent to her pharmacy. [facility] [address] in [place]

## 2023-10-23 ENCOUNTER — Encounter: Payer: Self-pay | Admitting: *Deleted

## 2023-10-23 ENCOUNTER — Telehealth: Payer: Self-pay | Admitting: *Deleted

## 2023-10-23 ENCOUNTER — Inpatient Hospital Stay: Payer: 59 | Attending: Internal Medicine

## 2023-10-23 ENCOUNTER — Other Ambulatory Visit: Payer: Self-pay | Admitting: Hematology and Oncology

## 2023-10-23 DIAGNOSIS — D75839 Thrombocytosis, unspecified: Secondary | ICD-10-CM

## 2023-10-23 DIAGNOSIS — D500 Iron deficiency anemia secondary to blood loss (chronic): Secondary | ICD-10-CM

## 2023-10-23 MED ORDER — NORELGESTROMIN-ETH ESTRADIOL 150-35 MCG/24HR TD PTWK: 1.0000 | MEDICATED_PATCH | TRANSDERMAL | 12 refills | Status: AC

## 2023-10-23 NOTE — Telephone Encounter (Signed)
 Opened in error

## 2023-10-24 ENCOUNTER — Encounter: Payer: Self-pay | Admitting: *Deleted

## 2023-10-24 NOTE — Telephone Encounter (Signed)
 I called and notified [facility] Rx approved and sent to pharmacy. She had already had BP check scheduled. I sent information in Epic as requested by provider. Patient voices understanding. Seren Chaloux,RN

## 2023-10-29 ENCOUNTER — Telehealth: Payer: Self-pay | Admitting: Hematology and Oncology

## 2023-10-30 ENCOUNTER — Inpatient Hospital Stay: Payer: 59 | Admitting: Hematology and Oncology

## 2023-10-30 ENCOUNTER — Inpatient Hospital Stay

## 2023-10-30 DIAGNOSIS — D75839 Thrombocytosis, unspecified: Secondary | ICD-10-CM | POA: Diagnosis not present

## 2023-10-30 DIAGNOSIS — D500 Iron deficiency anemia secondary to blood loss (chronic): Secondary | ICD-10-CM

## 2023-10-30 LAB — CBC WITH DIFFERENTIAL (CANCER CENTER ONLY)
Abs Immature Granulocytes: 0.04 10*3/uL (ref 0.00–0.07)
Basophils Absolute: 0.1 10*3/uL (ref 0.0–0.1)
Basophils Relative: 0 %
Eosinophils Absolute: 0.1 10*3/uL (ref 0.0–0.5)
Eosinophils Relative: 1 %
HCT: 37.7 % (ref 36.0–46.0)
Hemoglobin: 12.1 g/dL (ref 12.0–15.0)
Immature Granulocytes: 0 %
Lymphocytes Relative: 21 %
Lymphs Abs: 2.7 10*3/uL (ref 0.7–4.0)
MCH: 27.8 pg (ref 26.0–34.0)
MCHC: 32.1 g/dL (ref 30.0–36.0)
MCV: 86.5 fL (ref 80.0–100.0)
Monocytes Absolute: 0.6 10*3/uL (ref 0.1–1.0)
Monocytes Relative: 5 %
Neutro Abs: 9.2 10*3/uL — ABNORMAL HIGH (ref 1.7–7.7)
Neutrophils Relative %: 73 %
Platelet Count: 648 10*3/uL — ABNORMAL HIGH (ref 150–400)
RBC: 4.36 MIL/uL (ref 3.87–5.11)
RDW: 13.3 % (ref 11.5–15.5)
WBC Count: 12.7 10*3/uL — ABNORMAL HIGH (ref 4.0–10.5)
nRBC: 0 % (ref 0.0–0.2)

## 2023-10-30 LAB — RETIC PANEL
Immature Retic Fract: 12 % (ref 2.3–15.9)
RBC.: 4.33 MIL/uL (ref 3.87–5.11)
Retic Count, Absolute: 80.1 10*3/uL (ref 19.0–186.0)
Retic Ct Pct: 1.9 % (ref 0.4–3.1)
Reticulocyte Hemoglobin: 31.9 pg (ref >27.9)

## 2023-10-30 LAB — CMP (CANCER CENTER ONLY)
ALT: 11 U/L (ref 0–44)
AST: 14 U/L — ABNORMAL LOW (ref 15–41)
Albumin: 4.5 g/dL (ref 3.5–5.0)
Alkaline Phosphatase: 64 U/L (ref 38–126)
Anion gap: 7 (ref 5–15)
BUN: 8 mg/dL (ref 6–20)
CO2: 27 mmol/L (ref 22–32)
Calcium: 9.1 mg/dL (ref 8.9–10.3)
Chloride: 104 mmol/L (ref 98–111)
Creatinine: 0.66 mg/dL (ref 0.44–1.00)
GFR, Estimated: >60 mL/min (ref >60)
Glucose, Bld: 96 mg/dL (ref 70–99)
Potassium: 3.8 mmol/L (ref 3.5–5.1)
Sodium: 138 mmol/L (ref 135–145)
Total Bilirubin: 0.9 mg/dL (ref 0.0–1.2)
Total Protein: 7.9 g/dL (ref 6.5–8.1)

## 2023-10-31 LAB — FERRITIN: Ferritin: 27 ng/mL (ref 11–307)

## 2023-10-31 LAB — IRON AND IRON BINDING CAPACITY (CC-WL,HP ONLY)
Iron: 31 ug/dL (ref 28–170)
Saturation Ratios: 8 % — ABNORMAL LOW (ref 10.4–31.8)
TIBC: 386 ug/dL (ref 250–450)
UIBC: 355 ug/dL (ref 148–442)

## 2023-11-07 ENCOUNTER — Other Ambulatory Visit: Payer: Self-pay | Admitting: Obstetrics and Gynecology

## 2023-11-07 DIAGNOSIS — O0990 Supervision of high risk pregnancy, unspecified, unspecified trimester: Secondary | ICD-10-CM

## 2023-11-11 ENCOUNTER — Inpatient Hospital Stay: Attending: Hematology and Oncology | Admitting: Hematology and Oncology

## 2023-11-11 NOTE — Progress Notes (Unsigned)
 No show

## 2023-11-13 ENCOUNTER — Telehealth: Payer: Self-pay | Admitting: Hematology and Oncology

## 2023-11-18 ENCOUNTER — Inpatient Hospital Stay: Admitting: Hematology and Oncology

## 2023-11-18 ENCOUNTER — Other Ambulatory Visit: Payer: Self-pay | Admitting: Hematology and Oncology

## 2023-11-18 DIAGNOSIS — D500 Iron deficiency anemia secondary to blood loss (chronic): Secondary | ICD-10-CM

## 2023-11-18 DIAGNOSIS — D75839 Thrombocytosis, unspecified: Secondary | ICD-10-CM

## 2023-11-18 NOTE — Progress Notes (Deleted)
 No show Jody Robinson [phone]   [phone]  PROGRESS NOTE  Patient Care Team: Pcp, No as PCP - General [place] Bing, MD as PCP - OBGYN (Obstetrics and Gynecology)  Hematological/Oncological History # Thrombocytosis in Pregnancy  12/25/2022: establish care with Dr. Leonides Schanz.  Labs showed ferritin of 5 with iron sat of 11%. 01/18/2023 - 01/24/2023: 2 doses of IV Feraheme.  Interval History:  Jody Robinson 33 y.o. female with medical history significant for thrombocytosis in pregnancy who presents for a follow up visit. The patient's last visit was on 12/25/2022. In the interim since the last visit he received 2 doses of IV Feraheme in June.  On exam today Jody Robinson reports she did unfortunately have some nausea and vomiting after she got home after the iron infusions.  She reports it occurred for about 3 days after the first dose in 2 days after the second dose.  She reports that IV fluids did help with this.  She notes that now she feels quite good.  Her energy levels are improved and she is not having any lightheadedness, dizziness, or shortness of breath.  She has not had any overt signs of bleeding such as nosebleeds, gum bleeding, or dark stools.  She reports that her delivery date is currently September 16 at about 9 weeks.  She has had no other issues with the pregnancy moving forward.  She otherwise denies any fevers, chills, sweats, nausea, vomiting or diarrhea.  A full 10 point ROS is otherwise negative.  MEDICAL HISTORY:  Past Medical History:  Diagnosis Date   Abnormal Pap smear of cervix    Anemia in pregnancy 03/01/2020   S/p IV feraheme 6/18 and 02/04/2020  CBC Latest Ref Rng & Units 02/06/2020 01/19/2020 12/21/2019 WBC 4.0 - 10.5 K/uL 17.3(H) 13.8(H) 14.9(H) Hemoglobin 12.0 - 15.0 g/dL 8.1(X) 8.3(L) 8.5(L) Hematocrit 36 - 46 % 32.7(L) 26.9(L) 26.7(L) Platelets 150 - 400 K/uL 483(H) 472(H) 559(H)     Depression    Depression    Elevated liver  enzymes 10/13/2017   [ ]  rpt cmp with 28wk labs   Gallbladder sludge 08/22/2019   Herpes simplex 01/16/2013   History of 15wk loss (2020) 02/06/2019   [ ]  f/u antiphosphlipid abs future lab (placed 4/22)  Less than 20 weeks   History of abnormal cervical Pap smear 10/09/2017   2020: pap smear wnl   History of COVID-19 09/10/2019   COVID+ 09/08/19 symptomatic    History of herpes genitalis 10/09/2022   History of IUFD 02/06/2019   Less than 20 weeks   History of renal insufficiency 12/02/2022   Due to pyelo and dehydration but need to trend Cr   Hyperemesis gravidarum with electrolyte imbalance 10/03/2022   Twice weekly infusions-- 1L LR, Phenergan, Zofran, Protonix for 15 weeks of therapy written by Dr Alvester Morin 2.26   Will get CMP x 4 weekly      [ ]  oral potassium?   Migraine headache 11/24/2022   Sees neurologist for migraines who manages Tourette's   Migraine without aura and without status migrainosus, not intractable 05/10/2022   Pancreatitis, acute 08/20/2019   Seasonal allergies 10/09/2017   Thrombocytosis 08/16/2019   CBC  Latest Ref Rng & Units  02/06/2020  01/19/2020  12/21/2019  WBC  4.0 - 10.5 K/uL  17.3(H)  13.8(H)  14.9(H)  Hemoglobin  12.0 - 15.0 g/dL  9.1(Y)  8.3(L)  8.5(L)  Hematocrit  36 - 46 %  32.7(L)  26.9(L)  26.7(L)  Platelets  150 - 400 K/uL  483(H)  472(H)  559(H)         Tourette's disease 05/10/2022   Tourette's syndrome    Trichomoniasis     SURGICAL HISTORY: Past Surgical History:  Procedure Laterality Date   COLPOSCOPY      SOCIAL HISTORY: Social History   Socioeconomic History   Marital status: Single    Spouse name: Not on file   Number of children: Not on file   Years of education: Not on file   Highest education level: Not on file  Occupational History   Not on file  Tobacco Use   Smoking status: Never   Smokeless tobacco: Never  Vaping Use   Vaping status: Never Used  Substance and Sexual Activity   Alcohol use: Not Currently     Comment: occasional   Drug use: Not Currently   Sexual activity: Not Currently    Birth control/protection: None  Other Topics Concern   Not on file  Social History Narrative   Not on file   Social Drivers of Health   Financial Resource Strain: Not on file  Food Insecurity: No Food Insecurity (04/23/2023)   Hunger Vital Sign    Worried About Running Out of Food in the Last Year: Never true    Ran Out of Food in the Last Year: Never true  Transportation Needs: No Transportation Needs (04/23/2023)   PRAPARE - Administrator, Civil Service (Medical): No    Lack of Transportation (Non-Medical): No  Physical Activity: Not on file  Stress: Not on file  Social Connections: Not on file  Intimate Partner Violence: Not At Risk (04/23/2023)   Humiliation, Afraid, Rape, and Kick questionnaire    Fear of Current or Ex-Partner: No    Emotionally Abused: No    Physically Abused: No    Sexually Abused: No    FAMILY HISTORY: Family History  Problem Relation Age of Onset   Diabetes Mother     ALLERGIES:  has no known allergies.  MEDICATIONS:  Current Outpatient Medications  Medication Sig Dispense Refill   famotidine (PEPCID) 40 MG tablet Take 1 tablet (40 mg total) by mouth 2 (two) times daily. 60 tablet 2   fluconazole (DIFLUCAN) 150 MG tablet Take 1 tablet (150 mg total) by mouth daily. 1 tablet 0   metroNIDAZOLE (FLAGYL) 500 MG tablet Take 1 tablet (500 mg total) by mouth 2 (two) times daily. 14 tablet 0   norelgestromin-ethinyl estradiol [facility]) 150-35 MCG/24HR transdermal patch Place 1 patch onto the skin once a week. 3 patch 12   prenatal vitamin w/FE, FA (PRENATAL 1 + 1) 27-1 MG TABS tablet Take 1 tablet by mouth daily at 12 noon. 30 tablet 11   No current facility-administered medications for this visit.    REVIEW OF SYSTEMS:   Constitutional: ( - ) fevers, ( - )  chills , ( - ) night sweats Eyes: ( - ) blurriness of vision, ( - ) double vision, ( - ) watery  eyes Ears, nose, mouth, throat, and face: ( - ) mucositis, ( - ) sore throat Respiratory: ( - ) cough, ( - ) dyspnea, ( - ) wheezes Cardiovascular: ( - ) palpitation, ( - ) chest discomfort, ( - ) lower extremity swelling Gastrointestinal:  ( - ) nausea, ( - ) heartburn, ( - ) change in bowel habits Skin: ( - ) abnormal skin rashes Lymphatics: ( - ) new lymphadenopathy, ( - ) easy bruising  Neurological: ( - ) numbness, ( - ) tingling, ( - ) new weaknesses Behavioral/Psych: ( - ) mood change, ( - ) new changes  All other systems were reviewed with the patient and are negative.  PHYSICAL EXAMINATION:  There were no vitals filed for this visit.  There were no vitals filed for this visit.   GENERAL: Well-appearing middle-aged African-American female, alert, no distress and comfortable SKIN: skin color, texture, turgor are normal, no rashes or significant lesions EYES: conjunctiva are pink and non-injected, sclera clear LUNGS: clear to auscultation and percussion with normal breathing effort HEART: regular rate & rhythm and no murmurs and no lower extremity edema Musculoskeletal: no cyanosis of digits and no clubbing  PSYCH: alert & oriented x 3, fluent speech NEURO: no focal motor/sensory deficits  LABORATORY DATA:  I have reviewed the data as listed    Latest Ref Rng & Units 10/30/2023    3:27 PM 04/22/2023    5:59 PM 03/07/2023    7:04 PM  CBC  WBC 4.0 - 10.5 K/uL 12.7  13.5  11.0   Hemoglobin 12.0 - 15.0 g/dL 16.1  09.6  04.5   Hematocrit 36.0 - 46.0 % 37.7  43.3  37.5   Platelets 150 - 400 K/uL 648  492  490        Latest Ref Rng & Units 10/30/2023    3:27 PM 03/07/2023    7:04 PM 02/20/2023   11:08 AM  CMP  Glucose 70 - 99 mg/dL 96  76  74   BUN 6 - 20 mg/dL 8  5  5    Creatinine 0.44 - 1.00 mg/dL 4.09  8.11  9.14   Sodium 135 - 145 mmol/L 138  139  137   Potassium 3.5 - 5.1 mmol/L 3.8  3.2  3.8   Chloride 98 - 111 mmol/L 104  101  108   CO2 22 - 32 mmol/L 27  19  22     Calcium 8.9 - 10.3 mg/dL 9.1  8.6  8.4   Total Protein 6.5 - 8.1 g/dL 7.9  6.8  6.6   Total Bilirubin 0.0 - 1.2 mg/dL 0.9  1.7  0.6   Alkaline Phos 38 - 126 U/L 64  140  93   AST 15 - 41 U/L 14  24  13    ALT 0 - 44 U/L 11  17  9      RADIOGRAPHIC STUDIES: No results found.  ASSESSMENT & PLAN Jody Robinson 33 y.o. female with medical history significant for thrombocytosis in pregnancy who presents for a follow up visit.  After review of the labs, review of the records, and discussion with the patient the patients findings are most consistent with iron deficiency anemia in the setting of pregnancy   # Iron Deficiency Anemia in Setting of Pregnancy # Thrombocytosis 2/2 to Iron Deficiency  -- Findings are consistent with iron deficiency anemia secondary to pregnancy.  Patient likely had low iron stores due to menstrual bleeding prior to pregnancy. -- Recommend avoiding the use of p.o. iron therapy after the first trimester as it can cause constipation and is poorly absorbed. --Patient received 2 doses of Feraheme, 1 on 01/18/2023 and 01/24/2023 -- Labs today show white blood cell *** -- Iron levels appear replete, no indication for further IV iron. -- RTC 6 months post delivery.   No orders of the defined types were placed in this encounter.   All questions were answered. The patient knows to call the clinic  with any problems, questions or concerns.  A total of more than 30 minutes were spent on this encounter with face-to-face time and non-face-to-face time, including preparing to see the patient, ordering tests and/or medications, counseling the patient and coordination of care as outlined above.   Ulysees Barns, MD Department of Hematology/Oncology [facility] at [facility] Phone: [phone] Pager: [phone] Email: [email or web address]  11/18/2023 7:53 AM

## 2023-11-26 ENCOUNTER — Ambulatory Visit: Admitting: *Deleted

## 2023-11-26 ENCOUNTER — Other Ambulatory Visit: Payer: Self-pay

## 2023-11-26 VITALS — BP 111/65 | HR 57 | Ht 60.0 in | Wt 142.7 lb

## 2023-11-26 DIAGNOSIS — Z0130 Encounter for examination of blood pressure without abnormal findings: Secondary | ICD-10-CM

## 2023-11-26 NOTE — Progress Notes (Signed)
 Pt presents for BP check following start of Xulane patch which was prescribed on 10/23/23. She is not having any problems. BP - 111/65, P - 57.  Pt was advised to continue Xulane as prescribed. She voiced understanding.

## 2023-12-28 ENCOUNTER — Encounter (HOSPITAL_BASED_OUTPATIENT_CLINIC_OR_DEPARTMENT_OTHER): Payer: Self-pay

## 2023-12-28 ENCOUNTER — Emergency Department (HOSPITAL_BASED_OUTPATIENT_CLINIC_OR_DEPARTMENT_OTHER)
Admission: EM | Admit: 2023-12-28 | Discharge: 2023-12-28 | Disposition: A | Discharge status: Home / Self Care | Attending: Emergency Medicine | Admitting: Emergency Medicine

## 2023-12-28 ENCOUNTER — Emergency Department (HOSPITAL_BASED_OUTPATIENT_CLINIC_OR_DEPARTMENT_OTHER)

## 2023-12-28 ENCOUNTER — Other Ambulatory Visit: Payer: Self-pay

## 2023-12-28 DIAGNOSIS — Z8616 Personal history of COVID-19: Secondary | ICD-10-CM | POA: Diagnosis not present

## 2023-12-28 DIAGNOSIS — M79661 Pain in right lower leg: Secondary | ICD-10-CM | POA: Diagnosis not present

## 2023-12-28 DIAGNOSIS — M25561 Pain in right knee: Secondary | ICD-10-CM | POA: Diagnosis present

## 2023-12-28 MED ORDER — LIDOCAINE 5 % EX PTCH: 1.0000 | MEDICATED_PATCH | CUTANEOUS | 0 refills | Status: AC

## 2023-12-28 NOTE — ED Triage Notes (Signed)
 Reports R knee and calf pain for 2 days. States she thinks she pulled something. Ambulatory. Denies known injury

## 2023-12-28 NOTE — ED Provider Notes (Signed)
  EMERGENCY DEPARTMENT AT [facility] Provider Note   CSN: [id number] Arrival date & time: 12/28/23  4696     History  Chief Complaint  Patient presents with   Leg Pain    Jody Robinson is a 33 y.o. female.  HPI     33 year old female with a history of Tourette's syndrome, migraine, iron deficiency anemia, who presents with concern for right knee for the last 2 days.  2 days right knee pain, when bend hears/feels pop and then has some pain, inside of knee. Walking and then felt a pop and had pain.  Able to walk  on it now.  No leg swelling.  No cp or dyspnea. No fevers. No falls or trauma.  No recent travel, no hx of dvt/pe. Is on ocp  NO hx of knee problems  Past Medical History:  Diagnosis Date   Abnormal Pap smear of cervix    Anemia in pregnancy 03/01/2020   S/p IV feraheme  6/18 and 02/04/2020  CBC Latest Ref Rng & Units 02/06/2020 01/19/2020 12/21/2019 WBC 4.0 - 10.5 K/uL 17.3(H) 13.8(H) 14.9(H) Hemoglobin 12.0 - 15.0 g/dL 2.9(B) 8.3(L) 8.5(L) Hematocrit 36 - 46 % 32.7(L) 26.9(L) 26.7(L) Platelets 150 - 400 K/uL 483(H) 472(H) 559(H)     Depression    Depression    Elevated liver enzymes 10/13/2017   [ ]  rpt cmp with 28wk labs   Gallbladder sludge 08/22/2019   Herpes simplex 01/16/2013   History of 15wk loss (2020) 02/06/2019   [ ]  f/u antiphosphlipid abs future lab (placed 4/22)  Less than 20 weeks   History of abnormal cervical Pap smear 10/09/2017   2020: pap smear wnl   History of COVID-19 09/10/2019   COVID+ 09/08/19 symptomatic    History of herpes genitalis 10/09/2022   History of IUFD 02/06/2019   Less than 20 weeks   History of renal insufficiency 12/02/2022   Due to pyelo and dehydration but need to trend Cr   Hyperemesis gravidarum with electrolyte imbalance 10/03/2022   Twice weekly infusions-- 1L LR, Phenergan , Zofran , Protonix  for 15 weeks of therapy written by Dr Daisey Dryer 2.26   Will get CMP x 4 weekly      [ ]  oral potassium?    Migraine headache 11/24/2022   Sees neurologist for migraines who manages Tourette's   Migraine without aura and without status migrainosus, not intractable 05/10/2022   Pancreatitis, acute 08/20/2019   Seasonal allergies 10/09/2017   Thrombocytosis 08/16/2019   CBC  Latest Ref Rng & Units  02/06/2020  01/19/2020  12/21/2019  WBC  4.0 - 10.5 K/uL  17.3(H)  13.8(H)  14.9(H)  Hemoglobin  12.0 - 15.0 g/dL  2.8(U)  8.3(L)  8.5(L)  Hematocrit  36 - 46 %  32.7(L)  26.9(L)  26.7(L)  Platelets  150 - 400 K/uL  483(H)  472(H)  559(H)         Tourette's disease 05/10/2022   Tourette's syndrome    Trichomoniasis      Home Medications Prior to Admission medications   Medication Sig Start Date End Date Taking? Authorizing Provider  lidocaine  (LIDODERM ) 5 % Place 1 patch onto the skin daily. Remove & Discard patch within 12 hours or as directed by MD 12/28/23  Yes Scarlette Currier, MD  norelgestromin -ethinyl estradiol  (XULANE) 150-35 MCG/24HR transdermal patch Place 1 patch onto the skin once a week. 10/23/23   Anyanwu, Ugonna A, MD  prenatal vitamin w/FE, FA (PRENATAL 1 + 1) 27-1 MG TABS tablet  Take 1 tablet by mouth daily at 12 noon. Patient not taking: Reported on 11/26/2023 10/08/22   Raynell Caller, MD      Allergies    Patient has no known allergies.    Review of Systems   Review of Systems  Physical Exam Updated Vital Signs BP 109/69   Pulse 81   Temp 98.8 F (37.1 C) (Oral)   Resp 18   Ht 5' (1.524 m)   Wt 66.7 kg   SpO2 99%   BMI 28.71 kg/m  Physical Exam Vitals and nursing note reviewed.  Constitutional:      General: She is not in acute distress.    Appearance: She is well-developed. She is not diaphoretic.  HENT:     Head: Normocephalic and atraumatic.  Eyes:     Conjunctiva/sclera: Conjunctivae normal.  Cardiovascular:     Rate and Rhythm: Normal rate and regular rhythm.     Pulses: Normal pulses.  Pulmonary:     Effort: Pulmonary effort is normal. No respiratory  distress.  Abdominal:     General: There is no distension.     Palpations: Abdomen is soft.     Tenderness: There is no abdominal tenderness. There is no guarding.  Musculoskeletal:        General: Tenderness present.     Cervical back: Normal range of motion.  Skin:    General: Skin is warm and dry.     Findings: No erythema or rash.  Neurological:     Mental Status: She is alert and oriented to person, place, and time.     ED Results / Procedures / Treatments   Labs (all labs ordered are listed, but only abnormal results are displayed) Labs Reviewed - No data to display  EKG None  Radiology DG Knee Complete 4 Views Right Result Date: 12/28/2023 CLINICAL DATA:  Right knee and calf pain 4 2 days. EXAM: RIGHT KNEE - COMPLETE 4+ VIEW COMPARISON:  None Available. FINDINGS: Normal bone mineralization. Mild superior and inferior patellar degenerative osteophytosis. Mild peripheral medial aspect of the medial femoral condyle degenerative spurring. The medial and lateral compartment joint spaces are maintained. No joint effusion. No acute fracture or dislocation. IMPRESSION: Mild patellofemoral and medial compartment osteoarthritis. Electronically Signed   By: Bertina Broccoli M.D.   On: 12/28/2023 10:50   DG Tibia/Fibula Right Result Date: 12/28/2023 CLINICAL DATA:  Right calf pain without known injury for 2 days. EXAM: RIGHT TIBIA AND FIBULA - 2 VIEW COMPARISON:  None Available. FINDINGS: There is no evidence of fracture or other focal bone lesions. Soft tissues are unremarkable. IMPRESSION: Negative. Electronically Signed   By: Rosalene Colon M.D.   On: 12/28/2023 10:44    Procedures Procedures    Medications Ordered in ED Medications - No data to display  ED Course/ Medical Decision Making/ A&P                                  33 year old female with a history of Tourette's syndrome, migraine, iron deficiency anemia, who presents with concern for right knee pain.  Pain is  noted with popping and present to medial knee. Normal pulses, do not suspect acute arterial thrombus. Given location of pain and history described, no aysmmetric swelling, doubt DVT.  No sign of septic arthritis.  Does have XR showing other arthritis, no fracture or dislocation.  Given knee sleeve for support, recommend PCP follow up,  lidocaine , tylenol /ibuprofen . Patient discharged in stable condition with understanding of reasons to return.           Final Clinical Impression(s) / ED Diagnoses Final diagnoses:  Acute pain of right knee    Rx / DC Orders ED Discharge Orders          Ordered    lidocaine  (LIDODERM ) 5 %  Every 24 hours        12/28/23 1112              Scarlette Currier, MD 12/28/23 2313

## 2023-12-28 NOTE — Discharge Instructions (Addendum)
For your pain, you may take up to 1000mg of acetaminophen (tylenol) 4 times daily for up to a week. This is the maximum dose of acetminophen (tylenol) you can take from all sources. Please check other over-the-counter medications and prescriptions to ensure you are not taking other medications that contain acetaminophen.  You may also take ibuprofen 400 mg 6 times a day OR 600mg 4 times a day alternating with or at the same time as tylenol.   

## 2024-01-26 ENCOUNTER — Telehealth: Payer: Self-pay | Admitting: Family Medicine

## 2024-01-26 NOTE — Telephone Encounter (Signed)
 Please disregard this error

## 2024-02-05 ENCOUNTER — Telehealth: Payer: Self-pay | Admitting: Clinical

## 2024-02-05 NOTE — Telephone Encounter (Signed)
 Attempted to reach patient to let her know due to an emergency her appointment was canceled.

## 2024-02-09 ENCOUNTER — Encounter

## 2024-03-02 ENCOUNTER — Telehealth: Payer: Self-pay | Admitting: Clinical

## 2024-03-02 NOTE — Telephone Encounter (Signed)
 Attempt to reschedule; unable to leave voice message.

## 2024-03-11 ENCOUNTER — Emergency Department (HOSPITAL_COMMUNITY)
Admission: EM | Admit: 2024-03-11 | Discharge: 2024-03-11 | Disposition: A | Discharge status: Home / Self Care | Attending: Emergency Medicine | Admitting: Emergency Medicine

## 2024-03-11 ENCOUNTER — Emergency Department (HOSPITAL_COMMUNITY)

## 2024-03-11 ENCOUNTER — Other Ambulatory Visit: Payer: Self-pay

## 2024-03-11 DIAGNOSIS — W228XXA Striking against or struck by other objects, initial encounter: Secondary | ICD-10-CM | POA: Diagnosis not present

## 2024-03-11 DIAGNOSIS — S92415A Nondisplaced fracture of proximal phalanx of left great toe, initial encounter for closed fracture: Secondary | ICD-10-CM | POA: Insufficient documentation

## 2024-03-11 DIAGNOSIS — S99922A Unspecified injury of left foot, initial encounter: Secondary | ICD-10-CM | POA: Diagnosis present

## 2024-03-11 MED ORDER — OXYCODONE-ACETAMINOPHEN 5-325 MG PO TABS
1.0000 | ORAL_TABLET | Freq: Four times a day (QID) | ORAL | 0 refills | Status: AC | PRN
Start: 2024-03-11 — End: ?

## 2024-03-11 NOTE — ED Triage Notes (Signed)
 Pt. Stated, I hit the door with foot and hurt my left big toe.

## 2024-03-11 NOTE — ED Notes (Signed)
 Post op boot applied, crutches given with education and wheelchair at door for discharge.

## 2024-03-11 NOTE — Discharge Instructions (Signed)
 Your history, exam, workup today revealed you did break your left great toe.  I spoke to orthopedics who recommended the flat shoe to help protect your toe and you can be weightbearing as tolerated.  Use the crutches as well to help with the pain and the pain medicine.  Please keep it elevated and rest and you can ice it as well.  Please follow-up with orthopedics in the next week or 2 with Dr. Barton as recommended by orthopedics.  If any symptoms change or worsen acutely, return to the nearest emergency department.

## 2024-03-11 NOTE — ED Provider Notes (Signed)
 [place] EMERGENCY DEPARTMENT AT [facility] Provider Note   CSN: [id number] Arrival date & time: 03/11/24  9185     Patient presents with: Toe Injury   Jody Robinson is Robinson 33 y.o. female.   The history is provided by the patient and medical records. No language interpreter was used.  Toe Pain This is Robinson new problem. The current episode started yesterday. The problem occurs constantly. The problem has not changed since onset.Pertinent negatives include no chest pain, no abdominal pain, no headaches and no shortness of breath. The symptoms are aggravated by standing and walking. Nothing relieves the symptoms. She has tried nothing for the symptoms. The treatment provided no relief.       Prior to Admission medications   Medication Sig Start Date End Date Taking? Authorizing Provider  lidocaine  (LIDODERM ) 5 % Place 1 patch onto the skin daily. Remove & Discard patch within 12 hours or as directed by MD 12/28/23   Jody Longs, MD  norelgestromin -ethinyl estradiol  (XULANE) 150-35 MCG/24HR transdermal patch Place 1 patch onto the skin once Robinson week. 10/23/23   Anyanwu, Ugonna A, MD  prenatal vitamin w/FE, FA (PRENATAL 1 + 1) 27-1 MG TABS tablet Take 1 tablet by mouth daily at 12 noon. Patient not taking: Reported on 11/26/2023 10/08/22   Jody Harari, MD    Allergies: Patient has no known allergies.    Review of Systems  Constitutional:  Negative for chills, fatigue and fever.  Respiratory:  Negative for chest tightness and shortness of breath.   Cardiovascular:  Negative for chest pain.  Gastrointestinal:  Negative for abdominal pain.  Musculoskeletal:  Negative for back pain.  Neurological:  Negative for headaches.  Psychiatric/Behavioral:  Negative for agitation.   All other systems reviewed and are negative.   Updated Vital Signs BP (!) 116/90 (BP Location: Right Arm)   Pulse 82   Temp 98.6 F (37 C)   Resp 16   Ht 5' (1.524 m)   Wt 65.8 kg   LMP  03/10/2024   SpO2 100%   BMI 28.32 kg/m   Physical Exam Vitals and nursing note reviewed.  Constitutional:      General: She is not in acute distress.    Appearance: She is well-developed. She is not ill-appearing.  HENT:     Head: Normocephalic and atraumatic.     Mouth/Throat:     Mouth: Mucous membranes are moist.  Eyes:     Conjunctiva/sclera: Conjunctivae normal.  Cardiovascular:     Rate and Rhythm: Normal rate and regular rhythm.     Heart sounds: No murmur heard. Pulmonary:     Effort: Pulmonary effort is normal. No respiratory distress.     Breath sounds: Normal breath sounds. No wheezing, rhonchi or rales.  Chest:     Chest wall: No tenderness.  Abdominal:     Palpations: Abdomen is soft.     Tenderness: There is no abdominal tenderness.  Musculoskeletal:        General: Swelling, tenderness and signs of injury present.     Cervical back: Neck supple.     Left foot: Normal capillary refill. Swelling and tenderness present. No laceration. Normal pulse.       Feet:     Comments: Tenderness and bruising to the base of left great toe.  Skin:    General: Skin is warm and dry.     Capillary Refill: Capillary refill takes less than 2 seconds.     Findings: Bruising present.  No rash.  Neurological:     General: No focal deficit present.     Mental Status: She is alert.  Psychiatric:        Mood and Affect: Mood normal.     (all labs ordered are listed, but only abnormal results are displayed) Labs Reviewed - No data to display  EKG: None  Radiology: DG Foot Complete Left Result Date: 03/11/2024 CLINICAL DATA:  Left great toe pain after injury kicking door. EXAM: LEFT FOOT - COMPLETE 3+ VIEW COMPARISON:  None Available. FINDINGS: Mildly displaced and possibly comminuted fracture is seen involving the first proximal phalanx. No dislocation is noted. No soft tissue abnormality is noted. IMPRESSION: Mildly displaced and possibly comminuted first proximal phalangeal  fracture. Electronically Signed   By: Jody Robinson M.D.   On: 03/11/2024 09:32     Procedures   Medications Ordered in the ED - No data to display                                  Medical Decision Making Amount and/or Complexity of Data Reviewed Radiology: ordered.    Jody Robinson is Robinson 33 y.o. female with Robinson past medical history significant for hypothyroidism, depression, migraines, previous pancreatitis, and Tourette syndrome who presents with foot injury.  According to patient, she was walking last evening and hit her door with her left foot and left big toe.  She has never broken any bones in her body.  She reports that it is painful and swollen and is having difficulty walking on it.  She denies other injuries and denies any ankle pain, knee pain, hip pain, or head injury.  No other preceding symptoms such as fevers, chills, congestion, cough, or other complaints.  On my exam, patient does have bruising at the base of the left great toe and the distal left foot.  She is tender on the top and the bottom of the foot distally.  Ankles nontender.  Intact sensation on strength, pulses.  She can wiggle her toes.  Knees nontender and hips nontender.  Lungs clear otherwise.  Will get x-rays of the foot.   Anticipate disposition based on findings.  9:58 AM I viewed the x-ray and see fractures.  The x-ray was finalized and has Robinson first metatarsal fracture that appears somewhat comminuted.  I called and spoke with Jody Robinson with orthopedics who recommends postop shoe, weightbearing as tolerated with crutches as needed, pain medicine, and follow-up with Jody Robinson with orthopedics in clinic.  Patient agrees to this plan.  Will discharge for outpatient follow-up.       Final diagnoses:  Closed nondisplaced fracture of proximal phalanx of left great toe, initial encounter    ED Discharge Orders          Ordered    oxyCODONE -acetaminophen  (PERCOCET/ROXICET) 5-325 MG  tablet  Every 6 hours PRN        03/11/24 1001           Clinical Impression: 1. Closed nondisplaced fracture of proximal phalanx of left great toe, initial encounter     Disposition: Discharge  Condition: Good  I have discussed the results, Dx and Tx plan with the pt(& family if present). He/she/they expressed understanding and agree(s) with the plan. Discharge instructions discussed at great length. Strict return precautions discussed and pt &/or family have verbalized understanding of the instructions. No further questions at time of discharge.  New Prescriptions   OXYCODONE -ACETAMINOPHEN  (PERCOCET/ROXICET) 5-325 MG TABLET    Take 1 tablet by mouth every 6 (six) hours as needed for severe pain (pain score 7-10).    Follow Up: Robinson Drape, MD [address] [place] [address] [phone]   with orthopedics  [facility] Emergency Department at [facility] [address] [place]   [phone]        Andriel Omalley, Lonni PARAS, MD 03/11/24 1003

## 2024-03-12 ENCOUNTER — Telehealth: Payer: Self-pay | Admitting: Clinical

## 2024-03-12 NOTE — Telephone Encounter (Signed)
 Attempt to reschedule; unable to leave message as number is no longer in service.

## 2024-07-11 ENCOUNTER — Other Ambulatory Visit: Payer: Self-pay

## 2024-07-11 ENCOUNTER — Encounter (HOSPITAL_BASED_OUTPATIENT_CLINIC_OR_DEPARTMENT_OTHER): Payer: Self-pay

## 2024-07-11 ENCOUNTER — Emergency Department (HOSPITAL_BASED_OUTPATIENT_CLINIC_OR_DEPARTMENT_OTHER)
Admission: EM | Admit: 2024-07-11 | Discharge: 2024-07-11 | Disposition: A | Discharge status: Home / Self Care | Attending: Emergency Medicine | Admitting: Emergency Medicine

## 2024-07-11 DIAGNOSIS — Z202 Contact with and (suspected) exposure to infections with a predominantly sexual mode of transmission: Secondary | ICD-10-CM | POA: Diagnosis present

## 2024-07-11 DIAGNOSIS — B9689 Other specified bacterial agents as the cause of diseases classified elsewhere: Secondary | ICD-10-CM | POA: Diagnosis not present

## 2024-07-11 DIAGNOSIS — N760 Acute vaginitis: Secondary | ICD-10-CM | POA: Diagnosis not present

## 2024-07-11 DIAGNOSIS — Z113 Encounter for screening for infections with a predominantly sexual mode of transmission: Secondary | ICD-10-CM | POA: Diagnosis not present

## 2024-07-11 LAB — PREGNANCY, URINE: Preg Test, Ur: NEGATIVE

## 2024-07-11 LAB — WET PREP, GENITAL
Sperm: NONE SEEN
Trich, Wet Prep: NONE SEEN
WBC, Wet Prep HPF POC: <10 (ref <10)
Yeast Wet Prep HPF POC: NONE SEEN

## 2024-07-11 MED ORDER — METRONIDAZOLE 500 MG PO TABS: 500.0000 mg | ORAL_TABLET | Freq: Two times a day (BID) | ORAL | 0 refills | Status: AC

## 2024-07-11 MED ORDER — METRONIDAZOLE 500 MG PO TABS
500.0000 mg | ORAL_TABLET | Freq: Once | ORAL | Status: AC
  Administered 2024-07-11: 500 mg via ORAL
  Filled 2024-07-11: qty 1

## 2024-07-11 NOTE — ED Notes (Signed)
 Patient A+Ox4, VSS, NAD noted, denies vaginal D/C at this time.

## 2024-07-11 NOTE — ED Provider Notes (Signed)
 [place] EMERGENCY DEPARTMENT AT [facility] Provider Note   CSN: [id number] Arrival date & time: 07/11/24  1602     Patient presents with: SEXUALLY TRANSMITTED DISEASE   Jody Robinson is a 33 y.o. female with history of herpes simplex, presents requesting routine STI testing.  She denies any known exposure to STIs.  Denies any abnormal vaginal discharge, vaginal pain or itching.  Denies any abdominal pain, nausea or vomiting.  No fever or chills.   HPI     Prior to Admission medications   Medication Sig Start Date End Date Taking? Authorizing Provider  metroNIDAZOLE  (FLAGYL ) 500 MG tablet Take 1 tablet (500 mg total) by mouth 2 (two) times daily for 7 days. 07/12/24 07/19/24 Yes Veta Palma, PA-C  lidocaine  (LIDODERM ) 5 % Place 1 patch onto the skin daily. Remove & Discard patch within 12 hours or as directed by MD 12/28/23   Dreama Longs, MD  norelgestromin -ethinyl estradiol  (XULANE) 150-35 MCG/24HR transdermal patch Place 1 patch onto the skin once a week. 10/23/23   Anyanwu, Ugonna A, MD  oxyCODONE -acetaminophen  (PERCOCET/ROXICET) 5-325 MG tablet Take 1 tablet by mouth every 6 (six) hours as needed for severe pain (pain score 7-10). 03/11/24   Tegeler, Lonni PARAS, MD  prenatal vitamin w/FE, FA (PRENATAL 1 + 1) 27-1 MG TABS tablet Take 1 tablet by mouth daily at 12 noon. Patient not taking: Reported on 11/26/2023 10/08/22   Izell Harari, MD    Allergies: Patient has no known allergies.    Review of Systems  Constitutional:  Negative for fever.    Updated Vital Signs BP 130/74   Pulse 96   Temp 98.2 F (36.8 C) (Oral)   Resp 18   Ht 5' (1.524 m)   Wt 65.8 kg   SpO2 95%   BMI 28.33 kg/m   Physical Exam Vitals and nursing note reviewed.  Constitutional:      Appearance: Normal appearance.  HENT:     Head: Atraumatic.  Cardiovascular:     Rate and Rhythm: Normal rate and regular rhythm.  Pulmonary:     Effort: Pulmonary effort is  normal.  Abdominal:     General: Abdomen is flat.     Palpations: Abdomen is soft.     Tenderness: There is no abdominal tenderness.  Neurological:     General: No focal deficit present.     Mental Status: She is alert.  Psychiatric:        Mood and Affect: Mood normal.        Behavior: Behavior normal.     (all labs ordered are listed, but only abnormal results are displayed) Labs Reviewed  WET PREP, GENITAL - Abnormal; Notable for the following components:      Result Value   Clue Cells Wet Prep HPF POC PRESENT (*)    All other components within normal limits  PREGNANCY, URINE  SYPHILIS: RPR W/REFLEX TO RPR TITER AND TREPONEMAL ANTIBODIES, TRADITIONAL SCREENING AND DIAGNOSIS ALGORITHM  HIV ANTIBODY (ROUTINE TESTING W REFLEX)  HEPATITIS PANEL, ACUTE  GC/CHLAMYDIA PROBE AMP ([place]) NOT AT [facility]    EKG: None  Radiology: No results found.   Procedures   Medications Ordered in the ED  metroNIDAZOLE  (FLAGYL ) tablet 500 mg (500 mg Oral Given 07/11/24 1703)                                    Medical Decision Making  Amount and/or Complexity of Data Reviewed Labs: ordered.  Risk Prescription drug management.     Differential diagnosis includes but is not limited to yeast infection, BV, STI, PID, UTI, pregnancy  ED Course:  Upon initial evaluation, patient is well-appearing, normal vital signs.  Abdomen is soft and nontender.  Patient would prefer to self swab to collect gonorrhea and chlamydia as well as wet prep swab.  Labs Ordered: I Ordered, and personally interpreted labs.  The pertinent results include:   Urine pregnancy negative Wet prep with clue cells present.  Yeast and trichomonas negative Gonorrhea and Chlamydia, hepatitis, RPR, and HIV testing pending  Medications Given: Metronidazole   Upon re-evaluation, patient remains well-appearing.  Pregnancy test is negative.  We discussed that her wet prep was positive for bacterial vaginosis.  Will  treat with 7-day course of metronidazole .  First dose given here today.  Negative for yeast and trichomoniasis. She does not have any known exposure to STIs, no symptoms, we will hold off on any prophylactic treatment and wait for test results.  Patient is stable and appropriate for discharge home    Impression: Bacterial vaginosis Encounter for STI testing  Disposition:  The patient was discharged home with instructions to take 7-day course of metronidazole  as prescribed.  She understands that her STI testing is still in process, and that these results will be available in the next 2 to 3 days on her MyChart portal.  She understands that if anything is positive, she will be contacted to undergo treatment.  She understands both her and her sexual partners will need to undergo full course of treatment before resuming sexual activity. Return precautions given and patient verbalized understanding.    This chart was dictated using voice recognition software, Dragon. Despite the best efforts of this provider to proofread and correct errors, errors may still occur which can change documentation meaning.       Final diagnoses:  Bacterial vaginosis  Screening examination for STI    ED Discharge Orders          Ordered    metroNIDAZOLE  (FLAGYL ) 500 MG tablet  2 times daily        07/11/24 1700               Veta Palma, PA-C 07/11/24 1706    Towana Ozell BROCKS, MD 07/12/24 [phone]

## 2024-07-11 NOTE — ED Triage Notes (Signed)
 Pt states that she wants to be tested for STDs. Denies any symptoms.

## 2024-07-11 NOTE — Discharge Instructions (Addendum)
 You tested positive for bacterial vaginosis which is an overgrowth of the normal vaginal bacteria. This is not a sexually transmitted infection.  You tested negative for yeast and trichomonas.  Your testing for STIs including gonorrhea, chlamydia, syphilis, HIV, and hepatitis have been sent off.  Results will be available in the next 2 to 3 days.  If anything is positive, you will be contacted to undergo further treatment.  If any of these are positive, both you and your sexual partners need to undergo full course of treatment before resuming sexual activity.  Medications:  You have been prescribed an antibiotic called metronidazole  to treat this infection. Please take this medication twice daily for the next 7 days.  You were given your first dose here today.  Take your next dose tomorrow morning.  Take the full course of the antibiotic even if you start feeling better. Avoid alcohol when taking this medication as it can cause severe vomiting.  Return instructions:  Please return to the Emergency Department if you experience worsening symptoms.  Please return if you have any other emergent concerns.

## 2024-07-12 LAB — GC/CHLAMYDIA PROBE AMP (~~LOC~~) NOT AT ARMC
Chlamydia: NEGATIVE
Comment: NEGATIVE
Comment: NORMAL
Neisseria Gonorrhea: NEGATIVE

## 2024-07-12 LAB — HEPATITIS PANEL, ACUTE
HCV Ab: NONREACTIVE
Hep A IgM: NONREACTIVE
Hep B C IgM: NONREACTIVE
Hepatitis B Surface Ag: NONREACTIVE

## 2024-07-12 LAB — HIV ANTIBODY (ROUTINE TESTING W REFLEX): HIV Screen 4th Generation wRfx: NONREACTIVE

## 2024-07-12 LAB — SYPHILIS: RPR W/REFLEX TO RPR TITER AND TREPONEMAL ANTIBODIES, TRADITIONAL SCREENING AND DIAGNOSIS ALGORITHM: RPR Ser Ql: NONREACTIVE
